# Patient Record
Sex: Female | Born: 1935 | Race: White | Hispanic: No | Marital: Married | Smoking: Never smoker
Health system: Southern US, Community
[De-identification: ages and names within clinical notes are randomized; demographics above are authoritative.]

## PROBLEM LIST (undated history)

## (undated) DIAGNOSIS — S060XAA Concussion with loss of consciousness status unknown, initial encounter: Secondary | ICD-10-CM

## (undated) DIAGNOSIS — I340 Nonrheumatic mitral (valve) insufficiency: Secondary | ICD-10-CM

## (undated) DIAGNOSIS — C569 Malignant neoplasm of unspecified ovary: Secondary | ICD-10-CM

## (undated) DIAGNOSIS — I2699 Other pulmonary embolism without acute cor pulmonale: Secondary | ICD-10-CM

## (undated) DIAGNOSIS — C801 Malignant (primary) neoplasm, unspecified: Secondary | ICD-10-CM

## (undated) DIAGNOSIS — R000 Tachycardia, unspecified: Secondary | ICD-10-CM

## (undated) DIAGNOSIS — C5700 Malignant neoplasm of unspecified fallopian tube: Secondary | ICD-10-CM

## (undated) DIAGNOSIS — K219 Gastro-esophageal reflux disease without esophagitis: Secondary | ICD-10-CM

## (undated) DIAGNOSIS — N3020 Other chronic cystitis without hematuria: Secondary | ICD-10-CM

## (undated) DIAGNOSIS — I890 Lymphedema, not elsewhere classified: Secondary | ICD-10-CM

## (undated) DIAGNOSIS — S060X9A Concussion with loss of consciousness of unspecified duration, initial encounter: Secondary | ICD-10-CM

## (undated) DIAGNOSIS — I341 Nonrheumatic mitral (valve) prolapse: Secondary | ICD-10-CM

## (undated) DIAGNOSIS — N3281 Overactive bladder: Secondary | ICD-10-CM

## (undated) HISTORY — DX: Nonrheumatic mitral (valve) insufficiency: I34.0

## (undated) HISTORY — DX: Malignant neoplasm of unspecified fallopian tube: C57.00

## (undated) HISTORY — PX: ABDOMINAL HYSTERECTOMY: SHX81

## (undated) HISTORY — DX: Concussion with loss of consciousness status unknown, initial encounter: S06.0XAA

## (undated) HISTORY — DX: Overactive bladder: N32.81

## (undated) HISTORY — DX: Other pulmonary embolism without acute cor pulmonale: I26.99

## (undated) HISTORY — DX: Concussion with loss of consciousness of unspecified duration, initial encounter: S06.0X9A

## (undated) HISTORY — DX: Other chronic cystitis without hematuria: N30.20

## (undated) HISTORY — PX: CHOLECYSTECTOMY: SHX55

## (undated) HISTORY — DX: Tachycardia, unspecified: R00.0

## (undated) HISTORY — DX: Gastro-esophageal reflux disease without esophagitis: K21.9

---

## 2004-08-30 ENCOUNTER — Ambulatory Visit: Payer: Self-pay | Admitting: Internal Medicine

## 2004-08-31 ENCOUNTER — Ambulatory Visit: Payer: Self-pay | Admitting: Podiatry

## 2005-07-31 ENCOUNTER — Ambulatory Visit: Payer: Self-pay | Admitting: Unknown Physician Specialty

## 2005-08-14 ENCOUNTER — Ambulatory Visit: Payer: Self-pay | Admitting: Unknown Physician Specialty

## 2005-09-02 ENCOUNTER — Ambulatory Visit: Payer: Self-pay | Admitting: Internal Medicine

## 2005-09-10 ENCOUNTER — Ambulatory Visit: Payer: Self-pay | Admitting: Unknown Physician Specialty

## 2005-10-05 ENCOUNTER — Ambulatory Visit: Payer: Self-pay | Admitting: Unknown Physician Specialty

## 2006-04-24 ENCOUNTER — Other Ambulatory Visit: Payer: Self-pay

## 2006-04-24 ENCOUNTER — Ambulatory Visit: Payer: Self-pay | Admitting: Specialist

## 2006-05-07 ENCOUNTER — Ambulatory Visit: Payer: Self-pay | Admitting: Specialist

## 2006-06-18 ENCOUNTER — Encounter: Payer: Self-pay | Admitting: Specialist

## 2006-07-06 ENCOUNTER — Encounter: Payer: Self-pay | Admitting: Specialist

## 2006-08-05 ENCOUNTER — Encounter: Payer: Self-pay | Admitting: Specialist

## 2006-11-10 ENCOUNTER — Ambulatory Visit: Payer: Self-pay | Admitting: Family Medicine

## 2006-11-19 ENCOUNTER — Ambulatory Visit: Payer: Self-pay | Admitting: Family Medicine

## 2007-11-12 ENCOUNTER — Ambulatory Visit: Payer: Self-pay | Admitting: Internal Medicine

## 2008-11-17 ENCOUNTER — Ambulatory Visit: Payer: Self-pay | Admitting: Internal Medicine

## 2008-12-07 ENCOUNTER — Ambulatory Visit: Payer: Self-pay | Admitting: Ophthalmology

## 2009-02-04 HISTORY — PX: LAPAROSCOPIC BILATERAL SALPINGO OOPHERECTOMY: SHX5890

## 2009-02-04 HISTORY — PX: OTHER SURGICAL HISTORY: SHX169

## 2009-03-01 ENCOUNTER — Ambulatory Visit: Payer: Self-pay | Admitting: Ophthalmology

## 2009-10-05 ENCOUNTER — Ambulatory Visit: Payer: Self-pay | Admitting: Gynecologic Oncology

## 2009-10-05 DIAGNOSIS — C5700 Malignant neoplasm of unspecified fallopian tube: Secondary | ICD-10-CM

## 2009-10-05 HISTORY — DX: Malignant neoplasm of unspecified fallopian tube: C57.00

## 2009-10-06 ENCOUNTER — Ambulatory Visit: Payer: Self-pay | Admitting: Internal Medicine

## 2009-10-10 ENCOUNTER — Ambulatory Visit: Payer: Self-pay | Admitting: Gynecologic Oncology

## 2009-10-16 ENCOUNTER — Ambulatory Visit: Payer: Self-pay | Admitting: Unknown Physician Specialty

## 2009-10-24 ENCOUNTER — Inpatient Hospital Stay: Payer: Self-pay | Admitting: Unknown Physician Specialty

## 2009-10-25 LAB — PATHOLOGY REPORT

## 2009-10-30 ENCOUNTER — Ambulatory Visit: Payer: Self-pay | Admitting: Unknown Physician Specialty

## 2009-11-04 ENCOUNTER — Ambulatory Visit: Payer: Self-pay | Admitting: Gynecologic Oncology

## 2009-11-14 ENCOUNTER — Ambulatory Visit: Payer: Self-pay | Admitting: Oncology

## 2009-11-19 LAB — CA 125: CA 125: 121.6 U/mL — ABNORMAL HIGH (ref 0.0–34.0)

## 2009-11-21 ENCOUNTER — Ambulatory Visit: Payer: Self-pay | Admitting: Internal Medicine

## 2009-11-28 ENCOUNTER — Ambulatory Visit: Payer: Self-pay | Admitting: Gynecologic Oncology

## 2009-11-30 ENCOUNTER — Observation Stay: Payer: Self-pay | Admitting: Internal Medicine

## 2009-12-05 ENCOUNTER — Ambulatory Visit: Payer: Self-pay | Admitting: Oncology

## 2009-12-05 ENCOUNTER — Ambulatory Visit: Payer: Self-pay | Admitting: Gynecologic Oncology

## 2010-01-04 ENCOUNTER — Ambulatory Visit: Payer: Self-pay | Admitting: Gynecologic Oncology

## 2010-01-04 ENCOUNTER — Ambulatory Visit: Payer: Self-pay | Admitting: Oncology

## 2010-01-27 LAB — CA 125: CA 125: 154.9 U/mL — ABNORMAL HIGH (ref 0.0–34.0)

## 2010-02-04 ENCOUNTER — Ambulatory Visit: Payer: Self-pay | Admitting: Oncology

## 2010-02-04 ENCOUNTER — Ambulatory Visit: Payer: Self-pay | Admitting: Gynecologic Oncology

## 2010-02-08 LAB — CA 125: CA 125: 77 U/mL — ABNORMAL HIGH (ref 0.0–34.0)

## 2010-03-07 ENCOUNTER — Ambulatory Visit: Payer: Self-pay | Admitting: Oncology

## 2010-03-07 ENCOUNTER — Ambulatory Visit: Payer: Self-pay | Admitting: Gynecologic Oncology

## 2010-03-20 LAB — CA 125: CA 125: 54.2 U/mL — ABNORMAL HIGH (ref 0.0–34.0)

## 2010-04-05 ENCOUNTER — Ambulatory Visit: Payer: Self-pay | Admitting: Gynecologic Oncology

## 2010-04-05 ENCOUNTER — Ambulatory Visit: Payer: Self-pay | Admitting: Oncology

## 2010-05-01 LAB — CA 125: CA 125: 26.8 U/mL (ref 0.0–34.0)

## 2010-05-06 ENCOUNTER — Ambulatory Visit: Payer: Self-pay | Admitting: Oncology

## 2010-05-06 ENCOUNTER — Ambulatory Visit: Payer: Self-pay | Admitting: Gynecologic Oncology

## 2010-06-05 ENCOUNTER — Ambulatory Visit: Payer: Self-pay | Admitting: Gynecologic Oncology

## 2010-06-05 ENCOUNTER — Ambulatory Visit: Payer: Self-pay | Admitting: Oncology

## 2010-06-12 LAB — CA 125: CA 125: 18.7 U/mL (ref 0.0–34.0)

## 2010-07-06 ENCOUNTER — Ambulatory Visit: Payer: Self-pay | Admitting: Oncology

## 2010-07-06 ENCOUNTER — Ambulatory Visit: Payer: Self-pay | Admitting: Gynecologic Oncology

## 2010-07-24 LAB — CA 125: CA 125: 19 U/mL (ref 0.0–34.0)

## 2010-08-05 ENCOUNTER — Ambulatory Visit: Payer: Self-pay | Admitting: Gynecologic Oncology

## 2010-08-05 ENCOUNTER — Ambulatory Visit: Payer: Self-pay | Admitting: Oncology

## 2010-08-22 ENCOUNTER — Emergency Department: Payer: Self-pay | Admitting: Emergency Medicine

## 2010-08-30 ENCOUNTER — Ambulatory Visit: Payer: Self-pay | Admitting: Orthopedic Surgery

## 2010-09-04 ENCOUNTER — Ambulatory Visit: Payer: Self-pay | Admitting: Orthopedic Surgery

## 2010-09-05 ENCOUNTER — Ambulatory Visit: Payer: Self-pay | Admitting: Oncology

## 2010-09-05 ENCOUNTER — Ambulatory Visit: Payer: Self-pay | Admitting: Gynecologic Oncology

## 2010-09-10 ENCOUNTER — Ambulatory Visit: Payer: Self-pay | Admitting: Orthopedic Surgery

## 2010-10-09 ENCOUNTER — Ambulatory Visit: Payer: Self-pay | Admitting: Oncology

## 2010-10-16 LAB — CA 125: CA 125: 20.4 U/mL (ref 0.0–34.0)

## 2010-10-19 ENCOUNTER — Encounter: Payer: Self-pay | Admitting: Orthopedic Surgery

## 2010-11-05 ENCOUNTER — Encounter: Payer: Self-pay | Admitting: Orthopedic Surgery

## 2010-11-05 ENCOUNTER — Ambulatory Visit: Payer: Self-pay | Admitting: Oncology

## 2010-11-26 ENCOUNTER — Ambulatory Visit: Payer: Self-pay | Admitting: Internal Medicine

## 2010-11-27 LAB — CA 125: CA 125: 25.9 U/mL (ref 0.0–34.0)

## 2010-12-06 ENCOUNTER — Ambulatory Visit: Payer: Self-pay | Admitting: Oncology

## 2011-01-05 ENCOUNTER — Ambulatory Visit: Payer: Self-pay | Admitting: Oncology

## 2011-01-08 LAB — CA 125: CA 125: 26.8 U/mL (ref 0.0–34.0)

## 2011-02-05 ENCOUNTER — Ambulatory Visit: Payer: Self-pay | Admitting: Oncology

## 2011-02-18 LAB — COMPREHENSIVE METABOLIC PANEL
Albumin: 3.2 g/dL — ABNORMAL LOW (ref 3.4–5.0)
Anion Gap: 9 (ref 7–16)
BUN: 26 mg/dL — ABNORMAL HIGH (ref 7–18)
Bilirubin,Total: 0.2 mg/dL (ref 0.2–1.0)
Calcium, Total: 8.5 mg/dL (ref 8.5–10.1)
Chloride: 104 mmol/L (ref 98–107)
Co2: 27 mmol/L (ref 21–32)
EGFR (African American): 49 — ABNORMAL LOW
Osmolality: 284 (ref 275–301)
Potassium: 4.3 mmol/L (ref 3.5–5.1)
SGPT (ALT): 20 U/L
Sodium: 140 mmol/L (ref 136–145)
Total Protein: 7 g/dL (ref 6.4–8.2)

## 2011-02-18 LAB — CBC CANCER CENTER
Eosinophil: 1 %
Lymphocytes: 37 %
MCH: 28.9 pg (ref 26.0–34.0)
MCHC: 32.6 g/dL (ref 32.0–36.0)
MCV: 89 fL (ref 80–100)
Platelet: 475 x10 3/mm — ABNORMAL HIGH (ref 150–440)
RBC: 3.02 10*6/uL — ABNORMAL LOW (ref 3.80–5.20)
RDW: 14.7 % — ABNORMAL HIGH (ref 11.5–14.5)
Segmented Neutrophils: 48 %

## 2011-02-18 LAB — MAGNESIUM: Magnesium: 2 mg/dL

## 2011-02-18 LAB — PROTIME-INR: Prothrombin Time: 13 secs (ref 11.5–14.7)

## 2011-02-18 LAB — CREATININE, URINE, RANDOM: Creatinine, Urine Random: 38.8 mg/dL (ref 30.0–125.0)

## 2011-02-18 LAB — PHOSPHORUS: Phosphorus: 4.4 mg/dL (ref 2.5–4.9)

## 2011-03-08 ENCOUNTER — Ambulatory Visit: Payer: Self-pay | Admitting: Oncology

## 2011-03-11 LAB — CBC CANCER CENTER
Bands: 4 %
Basophil: 1 %
Eosinophil: 1 %
HCT: 26.9 % — ABNORMAL LOW (ref 35.0–47.0)
HGB: 8.7 g/dL — ABNORMAL LOW (ref 12.0–16.0)
Lymphocytes: 21 %
MCH: 28.2 pg (ref 26.0–34.0)
MCHC: 32.5 g/dL (ref 32.0–36.0)
RDW: 16 % — ABNORMAL HIGH (ref 11.5–14.5)
Segmented Neutrophils: 66 %
WBC: 5.7 x10 3/mm (ref 3.6–11.0)

## 2011-03-11 LAB — IRON AND TIBC
Iron Saturation: 11 %
Iron: 46 ug/dL — ABNORMAL LOW (ref 50–170)

## 2011-03-15 LAB — OCCULT BLOOD X 1 CARD TO LAB, STOOL: Occult Blood, Feces: NEGATIVE

## 2011-04-01 LAB — COMPREHENSIVE METABOLIC PANEL
BUN: 27 mg/dL — ABNORMAL HIGH (ref 7–18)
Bilirubin,Total: 0.3 mg/dL (ref 0.2–1.0)
Chloride: 104 mmol/L (ref 98–107)
Co2: 26 mmol/L (ref 21–32)
Creatinine: 1.35 mg/dL — ABNORMAL HIGH (ref 0.60–1.30)
EGFR (African American): 49 — ABNORMAL LOW
SGPT (ALT): 22 U/L
Total Protein: 7.3 g/dL (ref 6.4–8.2)

## 2011-04-01 LAB — CBC CANCER CENTER
Eosinophil: 1 %
HCT: 26.7 % — ABNORMAL LOW (ref 35.0–47.0)
HGB: 8.6 g/dL — ABNORMAL LOW (ref 12.0–16.0)
Lymphocytes: 32 %
MCH: 27.7 pg (ref 26.0–34.0)
MCHC: 32.4 g/dL (ref 32.0–36.0)
Platelet: 491 x10 3/mm — ABNORMAL HIGH (ref 150–440)
RBC: 3.13 10*6/uL — ABNORMAL LOW (ref 3.80–5.20)
RDW: 16.2 % — ABNORMAL HIGH (ref 11.5–14.5)
Segmented Neutrophils: 59 %

## 2011-04-01 LAB — PROTEIN, URINE, RANDOM: Protein, Random Urine: 10 mg/dL (ref 0–12)

## 2011-04-01 LAB — CREATININE, URINE, RANDOM: Creatinine, Urine Random: 76.6 mg/dL (ref 30.0–125.0)

## 2011-04-03 LAB — CA 125: CA 125: 25.8 U/mL (ref 0.0–34.0)

## 2011-04-05 ENCOUNTER — Ambulatory Visit: Payer: Self-pay | Admitting: Oncology

## 2011-04-23 LAB — COMPREHENSIVE METABOLIC PANEL
Albumin: 3.6 g/dL (ref 3.4–5.0)
Anion Gap: 10 (ref 7–16)
BUN: 26 mg/dL — ABNORMAL HIGH (ref 7–18)
Calcium, Total: 8.3 mg/dL — ABNORMAL LOW (ref 8.5–10.1)
EGFR (African American): 51 — ABNORMAL LOW
Glucose: 84 mg/dL (ref 65–99)
Potassium: 4.9 mmol/L (ref 3.5–5.1)
SGOT(AST): 24 U/L (ref 15–37)
SGPT (ALT): 28 U/L
Sodium: 139 mmol/L (ref 136–145)
Total Protein: 7.8 g/dL (ref 6.4–8.2)

## 2011-04-23 LAB — CBC CANCER CENTER
Basophil: 1 %
HCT: 29.5 % — ABNORMAL LOW (ref 35.0–47.0)
MCHC: 32.7 g/dL (ref 32.0–36.0)
MCV: 85 fL (ref 80–100)
Monocytes: 13 %
RDW: 17.3 % — ABNORMAL HIGH (ref 11.5–14.5)
WBC: 5.9 x10 3/mm (ref 3.6–11.0)

## 2011-05-06 ENCOUNTER — Ambulatory Visit: Payer: Self-pay | Admitting: Oncology

## 2011-05-06 ENCOUNTER — Ambulatory Visit: Payer: Self-pay | Admitting: Gynecologic Oncology

## 2011-06-05 ENCOUNTER — Ambulatory Visit: Payer: Self-pay | Admitting: Oncology

## 2011-06-19 ENCOUNTER — Ambulatory Visit: Payer: Self-pay | Admitting: Surgery

## 2011-06-19 LAB — CBC
MCV: 84 fL (ref 80–100)
Platelet: 484 10*3/uL — ABNORMAL HIGH (ref 150–440)
RBC: 3.58 10*6/uL — ABNORMAL LOW (ref 3.80–5.20)
RDW: 17.6 % — ABNORMAL HIGH (ref 11.5–14.5)

## 2011-06-19 LAB — BASIC METABOLIC PANEL
Anion Gap: 10 (ref 7–16)
BUN: 24 mg/dL — ABNORMAL HIGH (ref 7–18)
Chloride: 106 mmol/L (ref 98–107)
Creatinine: 1.23 mg/dL (ref 0.60–1.30)
EGFR (African American): 48 — ABNORMAL LOW
EGFR (Non-African Amer.): 41 — ABNORMAL LOW
Glucose: 85 mg/dL (ref 65–99)
Osmolality: 283 (ref 275–301)
Potassium: 4.2 mmol/L (ref 3.5–5.1)
Sodium: 140 mmol/L (ref 136–145)

## 2011-06-24 ENCOUNTER — Ambulatory Visit: Payer: Self-pay | Admitting: Surgery

## 2011-06-26 ENCOUNTER — Ambulatory Visit: Payer: Self-pay | Admitting: Surgery

## 2011-07-01 LAB — PATHOLOGY REPORT

## 2011-07-09 ENCOUNTER — Ambulatory Visit: Payer: Self-pay | Admitting: Oncology

## 2011-07-09 LAB — CBC CANCER CENTER
Basophil %: 1 %
Eosinophil #: 0.2 x10 3/mm (ref 0.0–0.7)
Eosinophil %: 3 %
Lymphocyte #: 2.2 x10 3/mm (ref 1.0–3.6)
MCHC: 31.6 g/dL — ABNORMAL LOW (ref 32.0–36.0)
MCV: 84 fL (ref 80–100)
Monocyte #: 0.7 x10 3/mm (ref 0.2–0.9)
Neutrophil #: 3.2 x10 3/mm (ref 1.4–6.5)
Neutrophil %: 50.1 %
Platelet: 564 x10 3/mm — ABNORMAL HIGH (ref 150–440)
RDW: 18 % — ABNORMAL HIGH (ref 11.5–14.5)

## 2011-07-09 LAB — URINALYSIS, COMPLETE
Glucose,UR: NEGATIVE mg/dL (ref 0–75)
Ketone: NEGATIVE
Nitrite: NEGATIVE
Ph: 6 (ref 4.5–8.0)
Protein: NEGATIVE
Specific Gravity: 1.009 (ref 1.003–1.030)
WBC UR: 336 /HPF (ref 0–5)

## 2011-07-09 LAB — COMPREHENSIVE METABOLIC PANEL
Albumin: 3.6 g/dL (ref 3.4–5.0)
Alkaline Phosphatase: 104 U/L (ref 50–136)
Anion Gap: 8 (ref 7–16)
Chloride: 103 mmol/L (ref 98–107)
Co2: 29 mmol/L (ref 21–32)
EGFR (African American): 41 — ABNORMAL LOW
EGFR (Non-African Amer.): 35 — ABNORMAL LOW
Glucose: 87 mg/dL (ref 65–99)
Potassium: 4.1 mmol/L (ref 3.5–5.1)
SGOT(AST): 25 U/L (ref 15–37)
Total Protein: 7.8 g/dL (ref 6.4–8.2)

## 2011-07-09 LAB — CREATININE, URINE, RANDOM: Creatinine, Urine Random: 36.8 mg/dL (ref 30.0–125.0)

## 2011-07-09 LAB — PROTEIN, URINE, RANDOM: Protein, Random Urine: 7 mg/dL (ref 0–12)

## 2011-07-10 LAB — CA 125: CA 125: 25.9 U/mL (ref 0.0–34.0)

## 2011-07-26 LAB — URINE CULTURE

## 2011-08-05 ENCOUNTER — Ambulatory Visit: Payer: Self-pay | Admitting: Oncology

## 2011-09-05 ENCOUNTER — Ambulatory Visit: Payer: Self-pay | Admitting: Oncology

## 2011-09-19 LAB — CBC CANCER CENTER
Basophil %: 1.6 %
Eosinophil %: 1.2 %
HGB: 9.3 g/dL — ABNORMAL LOW (ref 12.0–16.0)
Lymphocyte #: 1.7 x10 3/mm (ref 1.0–3.6)
Lymphocyte %: 37.6 %
Monocyte %: 13.4 %
Neutrophil #: 2.1 x10 3/mm (ref 1.4–6.5)
Neutrophil %: 46.2 %
Platelet: 537 x10 3/mm — ABNORMAL HIGH (ref 150–440)
RBC: 3.45 10*6/uL — ABNORMAL LOW (ref 3.80–5.20)
WBC: 4.5 x10 3/mm (ref 3.6–11.0)

## 2011-09-19 LAB — COMPREHENSIVE METABOLIC PANEL
Alkaline Phosphatase: 100 U/L (ref 50–136)
Bilirubin,Total: 0.3 mg/dL (ref 0.2–1.0)
Calcium, Total: 8.8 mg/dL (ref 8.5–10.1)
Chloride: 102 mmol/L (ref 98–107)
Co2: 29 mmol/L (ref 21–32)
Creatinine: 1.18 mg/dL (ref 0.60–1.30)
EGFR (African American): 50 — ABNORMAL LOW
EGFR (Non-African Amer.): 43 — ABNORMAL LOW
Potassium: 4.1 mmol/L (ref 3.5–5.1)
SGOT(AST): 22 U/L (ref 15–37)
SGPT (ALT): 20 U/L (ref 12–78)

## 2011-09-20 LAB — CA 125: CA 125: 20.9 U/mL (ref 0.0–34.0)

## 2011-10-06 ENCOUNTER — Ambulatory Visit: Payer: Self-pay | Admitting: Oncology

## 2011-10-15 ENCOUNTER — Ambulatory Visit: Payer: Self-pay | Admitting: Surgery

## 2011-10-19 DIAGNOSIS — N3020 Other chronic cystitis without hematuria: Secondary | ICD-10-CM

## 2011-10-19 DIAGNOSIS — N3941 Urge incontinence: Secondary | ICD-10-CM | POA: Insufficient documentation

## 2011-10-19 HISTORY — DX: Other chronic cystitis without hematuria: N30.20

## 2011-10-24 ENCOUNTER — Ambulatory Visit: Payer: Self-pay | Admitting: Surgery

## 2011-10-25 LAB — PATHOLOGY REPORT

## 2011-11-04 ENCOUNTER — Ambulatory Visit: Payer: Self-pay | Admitting: Oncology

## 2011-11-05 ENCOUNTER — Ambulatory Visit: Payer: Self-pay | Admitting: Oncology

## 2011-11-14 LAB — CBC CANCER CENTER
Bands: 1 %
Eosinophil: 2 %
HCT: 27.1 % — ABNORMAL LOW (ref 35.0–47.0)
HGB: 8.4 g/dL — ABNORMAL LOW (ref 12.0–16.0)
Lymphocytes: 37 %
Monocytes: 6 %
Platelet: 491 x10 3/mm — ABNORMAL HIGH (ref 150–440)
RBC: 3.37 10*6/uL — ABNORMAL LOW (ref 3.80–5.20)
RDW: 16.8 % — ABNORMAL HIGH (ref 11.5–14.5)
Segmented Neutrophils: 51 %
WBC: 4.4 x10 3/mm (ref 3.6–11.0)

## 2011-11-14 LAB — COMPREHENSIVE METABOLIC PANEL
Anion Gap: 11 (ref 7–16)
Calcium, Total: 8.8 mg/dL (ref 8.5–10.1)
Co2: 26 mmol/L (ref 21–32)
EGFR (African American): 54 — ABNORMAL LOW
EGFR (Non-African Amer.): 47 — ABNORMAL LOW
Osmolality: 285 (ref 275–301)
Potassium: 3.7 mmol/L (ref 3.5–5.1)
SGPT (ALT): 18 U/L (ref 12–78)
Sodium: 142 mmol/L (ref 136–145)

## 2011-11-21 LAB — IRON AND TIBC
Iron Bind.Cap.(Total): 452 ug/dL — ABNORMAL HIGH (ref 250–450)
Iron Saturation: 8 %
Iron: 36 ug/dL — ABNORMAL LOW (ref 50–170)
Unbound Iron-Bind.Cap.: 416 ug/dL

## 2011-11-21 LAB — CBC CANCER CENTER
Basophil #: 0 x10 3/mm (ref 0.0–0.1)
Basophil %: 2.1 %
HCT: 27.2 % — ABNORMAL LOW (ref 35.0–47.0)
Lymphocyte #: 0.9 x10 3/mm — ABNORMAL LOW (ref 1.0–3.6)
MCH: 24.7 pg — ABNORMAL LOW (ref 26.0–34.0)
MCV: 80 fL (ref 80–100)
Monocyte #: 0.2 x10 3/mm (ref 0.2–0.9)
Monocyte %: 7.9 %
Neutrophil #: 0.9 x10 3/mm — ABNORMAL LOW (ref 1.4–6.5)
Neutrophil %: 44.8 %
Platelet: 339 x10 3/mm (ref 150–440)
RDW: 17 % — ABNORMAL HIGH (ref 11.5–14.5)

## 2011-11-21 LAB — FERRITIN: Ferritin (ARMC): 46 ng/mL (ref 8–388)

## 2011-11-28 LAB — CBC CANCER CENTER
Eosinophil: 1 %
HCT: 24.9 % — ABNORMAL LOW (ref 35.0–47.0)
MCH: 24.7 pg — ABNORMAL LOW (ref 26.0–34.0)
MCV: 81 fL (ref 80–100)
Monocytes: 9 %
RDW: 17.9 % — ABNORMAL HIGH (ref 11.5–14.5)
WBC: 4.7 x10 3/mm (ref 3.6–11.0)

## 2011-11-28 LAB — COMPREHENSIVE METABOLIC PANEL
Albumin: 3.2 g/dL — ABNORMAL LOW (ref 3.4–5.0)
Alkaline Phosphatase: 103 U/L (ref 50–136)
Anion Gap: 11 (ref 7–16)
BUN: 17 mg/dL (ref 7–18)
Bilirubin,Total: 0.1 mg/dL — ABNORMAL LOW (ref 0.2–1.0)
Co2: 26 mmol/L (ref 21–32)
Creatinine: 1.02 mg/dL (ref 0.60–1.30)
Glucose: 89 mg/dL (ref 65–99)
Potassium: 3.9 mmol/L (ref 3.5–5.1)
SGOT(AST): 24 U/L (ref 15–37)
SGPT (ALT): 24 U/L (ref 12–78)
Total Protein: 6.8 g/dL (ref 6.4–8.2)

## 2011-12-02 LAB — OCCULT BLOOD X 1 CARD TO LAB, STOOL: Occult Blood, Feces: NEGATIVE

## 2011-12-06 ENCOUNTER — Ambulatory Visit: Payer: Self-pay | Admitting: Oncology

## 2011-12-12 LAB — COMPREHENSIVE METABOLIC PANEL WITH GFR
Albumin: 3.3 g/dL — ABNORMAL LOW
Alkaline Phosphatase: 96 U/L
Anion Gap: 11
BUN: 17 mg/dL
Bilirubin,Total: 0.2 mg/dL
Calcium, Total: 8.6 mg/dL
Chloride: 104 mmol/L
Co2: 27 mmol/L
Creatinine: 1.04 mg/dL
EGFR (African American): 58 — ABNORMAL LOW
EGFR (Non-African Amer.): 50 — ABNORMAL LOW
Glucose: 80 mg/dL
Osmolality: 284
Potassium: 3.5 mmol/L
SGOT(AST): 24 U/L
SGPT (ALT): 27 U/L
Sodium: 142 mmol/L
Total Protein: 7 g/dL

## 2011-12-12 LAB — CBC CANCER CENTER
Basophil #: 0.1 "x10 3/mm "
Basophil %: 1.5 %
Eosinophil #: 0.1 "x10 3/mm "
Eosinophil %: 1.3 %
HCT: 29.1 % — ABNORMAL LOW
HGB: 8.9 g/dL — ABNORMAL LOW
Lymphocyte %: 25.1 %
Lymphs Abs: 1 "x10 3/mm "
MCH: 25.7 pg — ABNORMAL LOW
MCHC: 30.6 g/dL — ABNORMAL LOW
MCV: 84 fL
Monocyte #: 0.8 "x10 3/mm "
Monocyte %: 20.3 %
Neutrophil #: 2.1 "x10 3/mm "
Neutrophil %: 51.8 %
Platelet: 382 "x10 3/mm "
RBC: 3.46 "x10 6/mm " — ABNORMAL LOW
RDW: 20.5 % — ABNORMAL HIGH
WBC: 4.1 "x10 3/mm "

## 2011-12-19 LAB — CBC CANCER CENTER
Basophil #: 0 x10 3/mm (ref 0.0–0.1)
Basophil %: 1.9 %
Eosinophil #: 0 x10 3/mm (ref 0.0–0.7)
Eosinophil %: 1.1 %
HCT: 26.6 % — ABNORMAL LOW (ref 35.0–47.0)
HGB: 8.3 g/dL — ABNORMAL LOW (ref 12.0–16.0)
Lymphocyte #: 1.6 x10 3/mm (ref 1.0–3.6)
MCH: 26.7 pg (ref 26.0–34.0)
MCV: 86 fL (ref 80–100)
Monocyte #: 0.3 x10 3/mm (ref 0.2–0.9)
Monocyte %: 10.8 %
Neutrophil %: 23.3 %
RBC: 3.11 10*6/uL — ABNORMAL LOW (ref 3.80–5.20)
WBC: 2.6 x10 3/mm — ABNORMAL LOW (ref 3.6–11.0)

## 2011-12-24 ENCOUNTER — Ambulatory Visit: Payer: Self-pay | Admitting: Internal Medicine

## 2011-12-26 LAB — COMPREHENSIVE METABOLIC PANEL
Albumin: 3.4 g/dL (ref 3.4–5.0)
Anion Gap: 9 (ref 7–16)
BUN: 17 mg/dL (ref 7–18)
Calcium, Total: 8.4 mg/dL — ABNORMAL LOW (ref 8.5–10.1)
Chloride: 105 mmol/L (ref 98–107)
Osmolality: 283 (ref 275–301)
Potassium: 3.8 mmol/L (ref 3.5–5.1)
Total Protein: 7.1 g/dL (ref 6.4–8.2)

## 2011-12-26 LAB — CBC CANCER CENTER
Basophil: 2 %
Eosinophil: 1 %
HGB: 9.4 g/dL — ABNORMAL LOW (ref 12.0–16.0)
Lymphocytes: 35 %
MCH: 27.9 pg (ref 26.0–34.0)
MCV: 88 fL (ref 80–100)
Monocytes: 15 %
Platelet: 375 x10 3/mm (ref 150–440)
RBC: 3.36 10*6/uL — ABNORMAL LOW (ref 3.80–5.20)
Segmented Neutrophils: 47 %

## 2012-01-03 LAB — CBC CANCER CENTER
Basophil #: 0 x10 3/mm (ref 0.0–0.1)
Eosinophil #: 0 x10 3/mm (ref 0.0–0.7)
Eosinophil %: 0.9 %
HCT: 28.7 % — ABNORMAL LOW (ref 35.0–47.0)
Lymphocyte %: 53 %
MCV: 88 fL (ref 80–100)
Monocyte %: 22.5 %
Neutrophil #: 0.6 x10 3/mm — ABNORMAL LOW (ref 1.4–6.5)
Platelet: 377 x10 3/mm (ref 150–440)
RBC: 3.27 10*6/uL — ABNORMAL LOW (ref 3.80–5.20)
RDW: 28.4 % — ABNORMAL HIGH (ref 11.5–14.5)
WBC: 2.7 x10 3/mm — ABNORMAL LOW (ref 3.6–11.0)

## 2012-01-05 ENCOUNTER — Ambulatory Visit: Payer: Self-pay | Admitting: Oncology

## 2012-01-09 LAB — COMPREHENSIVE METABOLIC PANEL
Alkaline Phosphatase: 92 U/L (ref 50–136)
Calcium, Total: 8.5 mg/dL (ref 8.5–10.1)
Co2: 27 mmol/L (ref 21–32)
Creatinine: 1 mg/dL (ref 0.60–1.30)
EGFR (Non-African Amer.): 53 — ABNORMAL LOW
Osmolality: 284 (ref 275–301)
Potassium: 3.5 mmol/L (ref 3.5–5.1)
SGOT(AST): 24 U/L (ref 15–37)
SGPT (ALT): 26 U/L (ref 12–78)
Total Protein: 6.8 g/dL (ref 6.4–8.2)

## 2012-01-09 LAB — CBC CANCER CENTER
Basophil: 2 %
HCT: 30.2 % — ABNORMAL LOW (ref 35.0–47.0)
HGB: 9.9 g/dL — ABNORMAL LOW (ref 12.0–16.0)
Lymphocytes: 26 %
MCV: 88 fL (ref 80–100)
Monocytes: 14 %
RBC: 3.44 10*6/uL — ABNORMAL LOW (ref 3.80–5.20)
RDW: 28.7 % — ABNORMAL HIGH (ref 11.5–14.5)
WBC: 3.9 x10 3/mm (ref 3.6–11.0)

## 2012-01-16 LAB — CBC CANCER CENTER
Basophil #: 0.1 x10 3/mm (ref 0.0–0.1)
Eosinophil #: 0 x10 3/mm (ref 0.0–0.7)
MCH: 29.6 pg (ref 26.0–34.0)
MCHC: 33 g/dL (ref 32.0–36.0)
Monocyte #: 0.2 x10 3/mm (ref 0.2–0.9)
Neutrophil %: 40.2 %
Platelet: 253 x10 3/mm (ref 150–440)
RBC: 3.23 10*6/uL — ABNORMAL LOW (ref 3.80–5.20)
WBC: 1.8 x10 3/mm — CL (ref 3.6–11.0)

## 2012-01-16 LAB — COMPREHENSIVE METABOLIC PANEL
Albumin: 3.4 g/dL (ref 3.4–5.0)
Alkaline Phosphatase: 96 U/L (ref 50–136)
Anion Gap: 11 (ref 7–16)
Bilirubin,Total: 0.2 mg/dL (ref 0.2–1.0)
Calcium, Total: 8.7 mg/dL (ref 8.5–10.1)
Chloride: 104 mmol/L (ref 98–107)
Co2: 27 mmol/L (ref 21–32)
Creatinine: 1.02 mg/dL (ref 0.60–1.30)
EGFR (Non-African Amer.): 52 — ABNORMAL LOW
Osmolality: 285 (ref 275–301)
Potassium: 3.5 mmol/L (ref 3.5–5.1)
Sodium: 142 mmol/L (ref 136–145)
Total Protein: 6.9 g/dL (ref 6.4–8.2)

## 2012-01-23 LAB — CBC CANCER CENTER
Basophil: 2 %
Eosinophil: 2 %
HGB: 9.7 g/dL — ABNORMAL LOW (ref 12.0–16.0)
Lymphocytes: 27 %
MCHC: 32.9 g/dL (ref 32.0–36.0)
Monocytes: 17 %
Platelet: 334 x10 3/mm (ref 150–440)
RBC: 3.24 10*6/uL — ABNORMAL LOW (ref 3.80–5.20)
RDW: 29.6 % — ABNORMAL HIGH (ref 11.5–14.5)
Segmented Neutrophils: 52 %
WBC: 3 x10 3/mm — ABNORMAL LOW (ref 3.6–11.0)

## 2012-02-05 ENCOUNTER — Ambulatory Visit: Payer: Self-pay | Admitting: Oncology

## 2012-02-06 LAB — COMPREHENSIVE METABOLIC PANEL
Albumin: 3.2 g/dL — ABNORMAL LOW (ref 3.4–5.0)
Alkaline Phosphatase: 93 U/L (ref 50–136)
BUN: 16 mg/dL (ref 7–18)
Bilirubin,Total: 0.2 mg/dL (ref 0.2–1.0)
Calcium, Total: 8.2 mg/dL — ABNORMAL LOW (ref 8.5–10.1)
Chloride: 108 mmol/L — ABNORMAL HIGH (ref 98–107)
Co2: 27 mmol/L (ref 21–32)
EGFR (African American): >60
EGFR (Non-African Amer.): >60
Potassium: 3.5 mmol/L (ref 3.5–5.1)
SGOT(AST): 28 U/L (ref 15–37)
SGPT (ALT): 21 U/L (ref 12–78)
Sodium: 141 mmol/L (ref 136–145)

## 2012-02-06 LAB — CBC CANCER CENTER
Lymphocytes: 31 %
MCH: 31.4 pg (ref 26.0–34.0)
MCHC: 33 g/dL (ref 32.0–36.0)
MCV: 95 fL (ref 80–100)
RBC: 3.29 10*6/uL — ABNORMAL LOW (ref 3.80–5.20)
Segmented Neutrophils: 59 %

## 2012-02-07 LAB — CA 125: CA 125: 23.9 U/mL (ref 0.0–34.0)

## 2012-02-10 DIAGNOSIS — N393 Stress incontinence (female) (male): Secondary | ICD-10-CM | POA: Insufficient documentation

## 2012-02-13 LAB — CBC CANCER CENTER
Basophil %: 1.4 %
Eosinophil %: 1.3 %
HCT: 30.2 % — ABNORMAL LOW (ref 35.0–47.0)
HGB: 10.1 g/dL — ABNORMAL LOW (ref 12.0–16.0)
Lymphocyte #: 1.2 x10 3/mm (ref 1.0–3.6)
Lymphocyte %: 54.1 %
MCHC: 33.4 g/dL (ref 32.0–36.0)
MCV: 96 fL (ref 80–100)
Monocyte #: 0.1 x10 3/mm — ABNORMAL LOW (ref 0.2–0.9)
Monocyte %: 6.4 %
Platelet: 224 x10 3/mm (ref 150–440)
RBC: 3.14 10*6/uL — ABNORMAL LOW (ref 3.80–5.20)

## 2012-02-20 ENCOUNTER — Ambulatory Visit: Payer: Self-pay | Admitting: Oncology

## 2012-02-20 LAB — CBC CANCER CENTER
Basophil #: 0.1 x10 3/mm (ref 0.0–0.1)
Eosinophil #: 0 x10 3/mm (ref 0.0–0.7)
Eosinophil %: 1 %
HCT: 30.2 % — ABNORMAL LOW (ref 35.0–47.0)
Lymphocyte #: 1.3 x10 3/mm (ref 1.0–3.6)
Lymphocyte %: 36.1 %
Neutrophil #: 1.5 x10 3/mm (ref 1.4–6.5)
Neutrophil %: 40.2 %
Platelet: 231 x10 3/mm (ref 150–440)
WBC: 3.7 x10 3/mm (ref 3.6–11.0)

## 2012-02-27 LAB — CBC CANCER CENTER
Eosinophil %: 0.4 %
HCT: 29.9 % — ABNORMAL LOW (ref 35.0–47.0)
HGB: 10 g/dL — ABNORMAL LOW (ref 12.0–16.0)
Lymphocyte %: 45.7 %
MCH: 33.1 pg (ref 26.0–34.0)
MCHC: 33.5 g/dL (ref 32.0–36.0)
Neutrophil #: 0.8 x10 3/mm — ABNORMAL LOW (ref 1.4–6.5)
RDW: 25.1 % — ABNORMAL HIGH (ref 11.5–14.5)
WBC: 2.1 x10 3/mm — ABNORMAL LOW (ref 3.6–11.0)

## 2012-03-05 LAB — CBC CANCER CENTER
Basophil #: 0 x10 3/mm (ref 0.0–0.1)
Basophil %: 1 %
Eosinophil #: 0 x10 3/mm (ref 0.0–0.7)
Eosinophil %: 0.9 %
HCT: 32.1 % — ABNORMAL LOW (ref 35.0–47.0)
HGB: 10.4 g/dL — ABNORMAL LOW (ref 12.0–16.0)
Lymphocyte %: 24.2 %
MCH: 32.6 pg (ref 26.0–34.0)
MCHC: 32.5 g/dL (ref 32.0–36.0)
Monocyte #: 0.9 x10 3/mm (ref 0.2–0.9)
Platelet: 313 x10 3/mm (ref 150–440)
RDW: 23.9 % — ABNORMAL HIGH (ref 11.5–14.5)
WBC: 3.7 x10 3/mm (ref 3.6–11.0)

## 2012-03-05 LAB — COMPREHENSIVE METABOLIC PANEL
Albumin: 3.3 g/dL — ABNORMAL LOW (ref 3.4–5.0)
Anion Gap: 11 (ref 7–16)
BUN: 18 mg/dL (ref 7–18)
Bilirubin,Total: 0.2 mg/dL (ref 0.2–1.0)
Creatinine: 1.11 mg/dL (ref 0.60–1.30)
EGFR (Non-African Amer.): 47 — ABNORMAL LOW
Glucose: 112 mg/dL — ABNORMAL HIGH (ref 65–99)
Osmolality: 288 (ref 275–301)
Potassium: 3.4 mmol/L — ABNORMAL LOW (ref 3.5–5.1)
SGOT(AST): 24 U/L (ref 15–37)
SGPT (ALT): 22 U/L (ref 12–78)
Sodium: 143 mmol/L (ref 136–145)

## 2012-03-07 ENCOUNTER — Ambulatory Visit: Payer: Self-pay | Admitting: Oncology

## 2012-03-12 LAB — CBC CANCER CENTER
Basophil #: 0 x10 3/mm (ref 0.0–0.1)
Basophil %: 1.6 %
Eosinophil #: 0 x10 3/mm (ref 0.0–0.7)
HCT: 33.1 % — ABNORMAL LOW (ref 35.0–47.0)
Lymphocyte %: 37.2 %
MCHC: 33 g/dL (ref 32.0–36.0)
MCV: 102 fL — ABNORMAL HIGH (ref 80–100)
Monocyte #: 0.8 x10 3/mm (ref 0.2–0.9)
Neutrophil #: 1.1 x10 3/mm — ABNORMAL LOW (ref 1.4–6.5)
Neutrophil %: 35 %
RBC: 3.24 10*6/uL — ABNORMAL LOW (ref 3.80–5.20)
RDW: 22.4 % — ABNORMAL HIGH (ref 11.5–14.5)

## 2012-03-12 LAB — COMPREHENSIVE METABOLIC PANEL
Anion Gap: 6 — ABNORMAL LOW (ref 7–16)
BUN: 22 mg/dL — ABNORMAL HIGH (ref 7–18)
Bilirubin,Total: 0.2 mg/dL (ref 0.2–1.0)
Chloride: 106 mmol/L (ref 98–107)
Creatinine: 1.05 mg/dL (ref 0.60–1.30)
EGFR (African American): 58 — ABNORMAL LOW
Glucose: 68 mg/dL (ref 65–99)
Osmolality: 288 (ref 275–301)
Potassium: 5 mmol/L (ref 3.5–5.1)
SGPT (ALT): 19 U/L (ref 12–78)
Sodium: 144 mmol/L (ref 136–145)
Total Protein: 7.1 g/dL (ref 6.4–8.2)

## 2012-03-15 ENCOUNTER — Emergency Department: Payer: Self-pay | Admitting: Emergency Medicine

## 2012-03-15 LAB — CBC WITH DIFFERENTIAL/PLATELET
Basophil #: 0 10*3/uL (ref 0.0–0.1)
Basophil %: 0.5 %
Eosinophil #: 0 10*3/uL (ref 0.0–0.7)
HCT: 30.3 % — ABNORMAL LOW (ref 35.0–47.0)
HGB: 9.6 g/dL — ABNORMAL LOW (ref 12.0–16.0)
Lymphocyte #: 0.7 10*3/uL — ABNORMAL LOW (ref 1.0–3.6)
MCH: 32.6 pg (ref 26.0–34.0)
Monocyte #: 0.2 x10 3/mm (ref 0.2–0.9)
Neutrophil #: 5.5 10*3/uL (ref 1.4–6.5)
Neutrophil %: 84.6 %
Platelet: 427 10*3/uL (ref 150–440)
RDW: 19.8 % — ABNORMAL HIGH (ref 11.5–14.5)
WBC: 6.5 10*3/uL (ref 3.6–11.0)

## 2012-03-19 ENCOUNTER — Inpatient Hospital Stay: Payer: Self-pay | Admitting: Internal Medicine

## 2012-03-19 LAB — URINALYSIS, COMPLETE
Bacteria: NONE SEEN
Bilirubin,UR: NEGATIVE
Glucose,UR: NEGATIVE mg/dL (ref 0–75)
Ketone: NEGATIVE
Leukocyte Esterase: NEGATIVE
Ph: 7 (ref 4.5–8.0)
Protein: NEGATIVE
RBC,UR: <1 /HPF (ref 0–5)
Specific Gravity: 1.005 (ref 1.003–1.030)

## 2012-03-19 LAB — APTT: Activated PTT: 29.8 secs (ref 23.6–35.9)

## 2012-03-19 LAB — COMPREHENSIVE METABOLIC PANEL
Albumin: 3.1 g/dL — ABNORMAL LOW (ref 3.4–5.0)
Anion Gap: 7 (ref 7–16)
BUN: 14 mg/dL (ref 7–18)
Bilirubin,Total: 0.3 mg/dL (ref 0.2–1.0)
Chloride: 108 mmol/L — ABNORMAL HIGH (ref 98–107)
Co2: 26 mmol/L (ref 21–32)
Creatinine: 0.91 mg/dL (ref 0.60–1.30)
EGFR (African American): >60
EGFR (Non-African Amer.): 59 — ABNORMAL LOW
Potassium: 3.5 mmol/L (ref 3.5–5.1)
SGOT(AST): 32 U/L (ref 15–37)
SGPT (ALT): 23 U/L (ref 12–78)
Sodium: 141 mmol/L (ref 136–145)
Total Protein: 7 g/dL (ref 6.4–8.2)

## 2012-03-19 LAB — CBC
HGB: 9.6 g/dL — ABNORMAL LOW (ref 12.0–16.0)
MCH: 33.2 pg (ref 26.0–34.0)
MCHC: 32.4 g/dL (ref 32.0–36.0)
MCV: 102 fL — ABNORMAL HIGH (ref 80–100)
RBC: 2.88 10*6/uL — ABNORMAL LOW (ref 3.80–5.20)
RDW: 18.8 % — ABNORMAL HIGH (ref 11.5–14.5)
WBC: 3.9 10*3/uL (ref 3.6–11.0)

## 2012-03-20 LAB — CBC WITH DIFFERENTIAL/PLATELET
Basophil #: 0.1 10*3/uL (ref 0.0–0.1)
Basophil %: 1.7 %
HCT: 32.9 % — ABNORMAL LOW (ref 35.0–47.0)
MCHC: 33.2 g/dL (ref 32.0–36.0)
Monocyte %: 19.7 %
Neutrophil %: 49.4 %
Platelet: 317 10*3/uL (ref 150–440)
WBC: 3.9 10*3/uL (ref 3.6–11.0)

## 2012-03-20 LAB — BASIC METABOLIC PANEL
Calcium, Total: 8.9 mg/dL (ref 8.5–10.1)
Co2: 26 mmol/L (ref 21–32)
EGFR (African American): >60
EGFR (Non-African Amer.): 57 — ABNORMAL LOW
Glucose: 100 mg/dL — ABNORMAL HIGH (ref 65–99)
Osmolality: 278 (ref 275–301)
Potassium: 3.7 mmol/L (ref 3.5–5.1)

## 2012-03-21 LAB — URINE CULTURE

## 2012-03-25 LAB — CULTURE, BLOOD (SINGLE)

## 2012-03-26 LAB — CBC CANCER CENTER
Eosinophil #: 0.1 x10 3/mm (ref 0.0–0.7)
Eosinophil %: 1.3 %
Lymphocyte %: 30.8 %
MCH: 34.2 pg — ABNORMAL HIGH (ref 26.0–34.0)
MCV: 103 fL — ABNORMAL HIGH (ref 80–100)
Monocyte #: 0.9 x10 3/mm (ref 0.2–0.9)
Monocyte %: 17.8 %
Neutrophil #: 2.4 x10 3/mm (ref 1.4–6.5)
Neutrophil %: 47.9 %
Platelet: 345 x10 3/mm (ref 150–440)
WBC: 5 x10 3/mm (ref 3.6–11.0)

## 2012-04-04 ENCOUNTER — Ambulatory Visit: Payer: Self-pay | Admitting: Oncology

## 2012-04-09 LAB — CBC CANCER CENTER
Basophil #: 0 x10 3/mm (ref 0.0–0.1)
Basophil %: 1.1 %
Eosinophil %: 1.9 %
HCT: 34.8 % — ABNORMAL LOW (ref 35.0–47.0)
Lymphocyte %: 35.2 %
MCV: 102 fL — ABNORMAL HIGH (ref 80–100)
Monocyte #: 0.8 x10 3/mm (ref 0.2–0.9)
Monocyte %: 20.9 %
Platelet: 352 x10 3/mm (ref 150–440)
RBC: 3.41 10*6/uL — ABNORMAL LOW (ref 3.80–5.20)

## 2012-04-09 LAB — COMPREHENSIVE METABOLIC PANEL
Albumin: 3.4 g/dL (ref 3.4–5.0)
Alkaline Phosphatase: 95 U/L (ref 50–136)
Anion Gap: 10 (ref 7–16)
Bilirubin,Total: 0.2 mg/dL (ref 0.2–1.0)
Chloride: 103 mmol/L (ref 98–107)
Glucose: 55 mg/dL — ABNORMAL LOW (ref 65–99)
Potassium: 3.9 mmol/L (ref 3.5–5.1)
SGOT(AST): 22 U/L (ref 15–37)
SGPT (ALT): 18 U/L (ref 12–78)
Total Protein: 7.2 g/dL (ref 6.4–8.2)

## 2012-04-16 LAB — CBC CANCER CENTER
Basophil #: 0.1 x10 3/mm (ref 0.0–0.1)
Basophil %: 2.3 %
Eosinophil %: 1.2 %
HCT: 32.2 % — ABNORMAL LOW (ref 35.0–47.0)
HGB: 10.8 g/dL — ABNORMAL LOW (ref 12.0–16.0)
Lymphocyte #: 1.1 x10 3/mm (ref 1.0–3.6)
Lymphocyte %: 44.5 %
MCH: 34.1 pg — ABNORMAL HIGH (ref 26.0–34.0)
MCV: 102 fL — ABNORMAL HIGH (ref 80–100)
Monocyte #: 0.2 x10 3/mm (ref 0.2–0.9)
Monocyte %: 8.5 %
Neutrophil %: 43.5 %
Platelet: 157 x10 3/mm (ref 150–440)
RDW: 15.4 % — ABNORMAL HIGH (ref 11.5–14.5)
WBC: 2.4 x10 3/mm — ABNORMAL LOW (ref 3.6–11.0)

## 2012-04-16 LAB — COMPREHENSIVE METABOLIC PANEL
BUN: 19 mg/dL — ABNORMAL HIGH (ref 7–18)
Calcium, Total: 8.2 mg/dL — ABNORMAL LOW (ref 8.5–10.1)
Chloride: 105 mmol/L (ref 98–107)
Co2: 29 mmol/L (ref 21–32)
Creatinine: 1.03 mg/dL (ref 0.60–1.30)
EGFR (Non-African Amer.): 51 — ABNORMAL LOW
Glucose: 89 mg/dL (ref 65–99)
Osmolality: 283 (ref 275–301)
Potassium: 3.6 mmol/L (ref 3.5–5.1)
Total Protein: 7 g/dL (ref 6.4–8.2)

## 2012-04-23 LAB — CBC CANCER CENTER
HCT: 31.8 % — ABNORMAL LOW (ref 35.0–47.0)
HGB: 10.8 g/dL — ABNORMAL LOW (ref 12.0–16.0)
Monocyte #: 0.2 x10 3/mm (ref 0.2–0.9)
Neutrophil #: 0.6 x10 3/mm — ABNORMAL LOW (ref 1.4–6.5)
Neutrophil %: 28.1 %
Platelet: 64 x10 3/mm — ABNORMAL LOW (ref 150–440)
RDW: 15 % — ABNORMAL HIGH (ref 11.5–14.5)
WBC: 2.1 x10 3/mm — ABNORMAL LOW (ref 3.6–11.0)

## 2012-04-30 LAB — COMPREHENSIVE METABOLIC PANEL
Albumin: 3.3 g/dL — ABNORMAL LOW (ref 3.4–5.0)
Alkaline Phosphatase: 101 U/L (ref 50–136)
Anion Gap: 4 — ABNORMAL LOW (ref 7–16)
BUN: 17 mg/dL (ref 7–18)
Bilirubin,Total: 0.2 mg/dL (ref 0.2–1.0)
Calcium, Total: 8.3 mg/dL — ABNORMAL LOW (ref 8.5–10.1)
Chloride: 108 mmol/L — ABNORMAL HIGH (ref 98–107)
Creatinine: 1.13 mg/dL (ref 0.60–1.30)
EGFR (African American): 53 — ABNORMAL LOW
EGFR (Non-African Amer.): 46 — ABNORMAL LOW
Glucose: 57 mg/dL — ABNORMAL LOW (ref 65–99)
Potassium: 3.7 mmol/L (ref 3.5–5.1)
SGOT(AST): 25 U/L (ref 15–37)
Sodium: 140 mmol/L (ref 136–145)
Total Protein: 7.2 g/dL (ref 6.4–8.2)

## 2012-04-30 LAB — CBC CANCER CENTER
Basophil %: 1.7 %
Eosinophil #: 0 x10 3/mm (ref 0.0–0.7)
HCT: 33 % — ABNORMAL LOW (ref 35.0–47.0)
HGB: 11 g/dL — ABNORMAL LOW (ref 12.0–16.0)
Lymphocyte #: 0.8 x10 3/mm — ABNORMAL LOW (ref 1.0–3.6)
MCH: 33.7 pg (ref 26.0–34.0)
MCV: 101 fL — ABNORMAL HIGH (ref 80–100)
Monocyte %: 25.9 %
Platelet: 471 x10 3/mm — ABNORMAL HIGH (ref 150–440)
RBC: 3.26 10*6/uL — ABNORMAL LOW (ref 3.80–5.20)
RDW: 15 % — ABNORMAL HIGH (ref 11.5–14.5)
WBC: 2.8 x10 3/mm — ABNORMAL LOW (ref 3.6–11.0)

## 2012-05-05 ENCOUNTER — Ambulatory Visit: Payer: Self-pay | Admitting: Oncology

## 2012-05-07 LAB — COMPREHENSIVE METABOLIC PANEL
Bilirubin,Total: 0.2 mg/dL (ref 0.2–1.0)
Calcium, Total: 8.2 mg/dL — ABNORMAL LOW (ref 8.5–10.1)
Chloride: 105 mmol/L (ref 98–107)
Creatinine: 1.11 mg/dL (ref 0.60–1.30)
EGFR (African American): 54 — ABNORMAL LOW
EGFR (Non-African Amer.): 47 — ABNORMAL LOW
Osmolality: 286 (ref 275–301)
Potassium: 3.7 mmol/L (ref 3.5–5.1)
SGOT(AST): 23 U/L (ref 15–37)

## 2012-05-07 LAB — CBC CANCER CENTER
Basophil #: 0.2 x10 3/mm — ABNORMAL HIGH (ref 0.0–0.1)
Basophil %: 5.6 %
Eosinophil #: 0.1 x10 3/mm (ref 0.0–0.7)
HCT: 33.9 % — ABNORMAL LOW (ref 35.0–47.0)
HGB: 11 g/dL — ABNORMAL LOW (ref 12.0–16.0)
Lymphocyte #: 1 x10 3/mm (ref 1.0–3.6)
MCH: 32.8 pg (ref 26.0–34.0)
MCHC: 32.4 g/dL (ref 32.0–36.0)
MCV: 101 fL — ABNORMAL HIGH (ref 80–100)
Monocyte %: 27.9 %
Neutrophil %: 29.4 %
WBC: 2.8 x10 3/mm — ABNORMAL LOW (ref 3.6–11.0)

## 2012-05-21 LAB — COMPREHENSIVE METABOLIC PANEL
Albumin: 3.1 g/dL — ABNORMAL LOW (ref 3.4–5.0)
Alkaline Phosphatase: 94 U/L (ref 50–136)
BUN: 25 mg/dL — ABNORMAL HIGH (ref 7–18)
Bilirubin,Total: 0.1 mg/dL — ABNORMAL LOW (ref 0.2–1.0)
Calcium, Total: 8.6 mg/dL (ref 8.5–10.1)
Co2: 29 mmol/L (ref 21–32)
Creatinine: 1.28 mg/dL (ref 0.60–1.30)
EGFR (African American): 45 — ABNORMAL LOW
EGFR (Non-African Amer.): 39 — ABNORMAL LOW
Glucose: 85 mg/dL (ref 65–99)
Osmolality: 287 (ref 275–301)
Potassium: 4.3 mmol/L (ref 3.5–5.1)
SGPT (ALT): 16 U/L (ref 12–78)
Sodium: 142 mmol/L (ref 136–145)

## 2012-05-21 LAB — CBC CANCER CENTER
Basophil #: 0.1 x10 3/mm (ref 0.0–0.1)
Basophil %: 2.2 %
Eosinophil #: 0.1 x10 3/mm (ref 0.0–0.7)
MCV: 101 fL — ABNORMAL HIGH (ref 80–100)
Monocyte #: 0.7 x10 3/mm (ref 0.2–0.9)
Monocyte %: 14.5 %
Neutrophil %: 53.3 %
Platelet: 231 x10 3/mm (ref 150–440)

## 2012-05-28 LAB — CBC CANCER CENTER
Basophil %: 1.7 %
Eosinophil #: 0 x10 3/mm (ref 0.0–0.7)
HCT: 26.6 % — ABNORMAL LOW (ref 35.0–47.0)
HGB: 8.7 g/dL — ABNORMAL LOW (ref 12.0–16.0)
Lymphocyte %: 40.7 %
MCH: 33 pg (ref 26.0–34.0)
MCHC: 32.7 g/dL (ref 32.0–36.0)
Monocyte %: 7.3 %
Platelet: 134 x10 3/mm — ABNORMAL LOW (ref 150–440)
RBC: 2.64 10*6/uL — ABNORMAL LOW (ref 3.80–5.20)
RDW: 14.1 % (ref 11.5–14.5)
WBC: 2.9 x10 3/mm — ABNORMAL LOW (ref 3.6–11.0)

## 2012-06-04 ENCOUNTER — Ambulatory Visit: Payer: Self-pay | Admitting: Oncology

## 2012-07-02 ENCOUNTER — Ambulatory Visit: Payer: Self-pay | Admitting: Oncology

## 2012-07-06 ENCOUNTER — Ambulatory Visit: Payer: Self-pay | Admitting: Oncology

## 2012-07-07 DIAGNOSIS — R3989 Other symptoms and signs involving the genitourinary system: Secondary | ICD-10-CM | POA: Insufficient documentation

## 2012-08-04 ENCOUNTER — Ambulatory Visit: Payer: Self-pay | Admitting: Oncology

## 2012-09-04 ENCOUNTER — Ambulatory Visit: Payer: Self-pay | Admitting: Oncology

## 2012-10-05 ENCOUNTER — Ambulatory Visit: Payer: Self-pay | Admitting: Oncology

## 2012-10-07 LAB — CREATININE, SERUM
Creatinine: 1.01 mg/dL (ref 0.60–1.30)
EGFR (Non-African Amer.): 52 — ABNORMAL LOW

## 2012-10-12 LAB — CBC CANCER CENTER
Basophil #: 0 x10 3/mm (ref 0.0–0.1)
HCT: 39.1 % (ref 35.0–47.0)
Lymphocyte #: 1.8 x10 3/mm (ref 1.0–3.6)
MCH: 31.2 pg (ref 26.0–34.0)
MCV: 95 fL (ref 80–100)
Platelet: 339 x10 3/mm (ref 150–440)

## 2012-10-12 LAB — COMPREHENSIVE METABOLIC PANEL
Anion Gap: 8 (ref 7–16)
BUN: 18 mg/dL (ref 7–18)
Calcium, Total: 9.2 mg/dL (ref 8.5–10.1)
Chloride: 104 mmol/L (ref 98–107)
Co2: 30 mmol/L (ref 21–32)
Creatinine: 1.1 mg/dL (ref 0.60–1.30)
EGFR (African American): 54 — ABNORMAL LOW
EGFR (Non-African Amer.): 47 — ABNORMAL LOW
Glucose: 105 mg/dL — ABNORMAL HIGH (ref 65–99)
SGPT (ALT): 20 U/L (ref 12–78)
Sodium: 142 mmol/L (ref 136–145)
Total Protein: 7.9 g/dL (ref 6.4–8.2)

## 2012-11-04 ENCOUNTER — Ambulatory Visit: Payer: Self-pay | Admitting: Oncology

## 2012-12-05 ENCOUNTER — Ambulatory Visit: Payer: Self-pay | Admitting: Oncology

## 2012-12-29 ENCOUNTER — Ambulatory Visit: Payer: Self-pay | Admitting: Internal Medicine

## 2013-01-04 ENCOUNTER — Ambulatory Visit: Payer: Self-pay | Admitting: Oncology

## 2013-01-08 ENCOUNTER — Ambulatory Visit: Payer: Self-pay | Admitting: Internal Medicine

## 2013-01-11 LAB — CBC CANCER CENTER
Basophil #: 0 x10 3/mm (ref 0.0–0.1)
Basophil %: 0.9 %
Eosinophil %: 0.8 %
HCT: 39 % (ref 35.0–47.0)
HGB: 12.7 g/dL (ref 12.0–16.0)
Lymphocyte #: 1.8 x10 3/mm (ref 1.0–3.6)
Lymphocyte %: 32 %
MCH: 31.8 pg (ref 26.0–34.0)
MCHC: 32.6 g/dL (ref 32.0–36.0)
MCV: 97 fL (ref 80–100)
Monocyte #: 0.5 x10 3/mm (ref 0.2–0.9)
Neutrophil %: 56.7 %
WBC: 5.5 x10 3/mm (ref 3.6–11.0)

## 2013-01-11 LAB — COMPREHENSIVE METABOLIC PANEL
Alkaline Phosphatase: 90 U/L
BUN: 20 mg/dL — ABNORMAL HIGH (ref 7–18)
Bilirubin,Total: 0.2 mg/dL (ref 0.2–1.0)
Calcium, Total: 8.6 mg/dL (ref 8.5–10.1)
Creatinine: 1.15 mg/dL (ref 0.60–1.30)
EGFR (African American): 51 — ABNORMAL LOW
EGFR (Non-African Amer.): 44 — ABNORMAL LOW
Osmolality: 285 (ref 275–301)
Potassium: 4.1 mmol/L (ref 3.5–5.1)
Total Protein: 7.5 g/dL (ref 6.4–8.2)

## 2013-02-04 ENCOUNTER — Ambulatory Visit: Payer: Self-pay | Admitting: Oncology

## 2013-02-14 ENCOUNTER — Ambulatory Visit: Payer: Self-pay | Admitting: Internal Medicine

## 2013-03-19 ENCOUNTER — Ambulatory Visit: Payer: Self-pay | Admitting: Oncology

## 2013-03-19 LAB — CREATININE, SERUM
Creatinine: 1.05 mg/dL (ref 0.60–1.30)
EGFR (African American): 57 — ABNORMAL LOW
EGFR (Non-African Amer.): 49 — ABNORMAL LOW

## 2013-03-30 LAB — CBC CANCER CENTER
Basophil #: 0.1 x10 3/mm (ref 0.0–0.1)
Basophil %: 0.9 %
EOS ABS: 0 x10 3/mm (ref 0.0–0.7)
Eosinophil %: 0.8 %
HCT: 39.7 % (ref 35.0–47.0)
HGB: 12.9 g/dL (ref 12.0–16.0)
LYMPHS PCT: 30.3 %
Lymphocyte #: 1.7 x10 3/mm (ref 1.0–3.6)
MCH: 31 pg (ref 26.0–34.0)
MCHC: 32.5 g/dL (ref 32.0–36.0)
MCV: 96 fL (ref 80–100)
MONO ABS: 0.8 x10 3/mm (ref 0.2–0.9)
Monocyte %: 13.5 %
NEUTROS ABS: 3.1 x10 3/mm (ref 1.4–6.5)
Neutrophil %: 54.5 %
PLATELETS: 335 x10 3/mm (ref 150–440)
RBC: 4.15 10*6/uL (ref 3.80–5.20)
RDW: 13.3 % (ref 11.5–14.5)
WBC: 5.7 x10 3/mm (ref 3.6–11.0)

## 2013-03-30 LAB — COMPREHENSIVE METABOLIC PANEL
ALBUMIN: 3.5 g/dL (ref 3.4–5.0)
ALT: 17 U/L (ref 12–78)
ANION GAP: 6 — AB (ref 7–16)
AST: 20 U/L (ref 15–37)
Alkaline Phosphatase: 89 U/L
BUN: 22 mg/dL — AB (ref 7–18)
Bilirubin,Total: 0.3 mg/dL (ref 0.2–1.0)
CALCIUM: 8.1 mg/dL — AB (ref 8.5–10.1)
Chloride: 103 mmol/L (ref 98–107)
Co2: 31 mmol/L (ref 21–32)
Creatinine: 1.25 mg/dL (ref 0.60–1.30)
EGFR (African American): 46 — ABNORMAL LOW
EGFR (Non-African Amer.): 40 — ABNORMAL LOW
Glucose: 112 mg/dL — ABNORMAL HIGH (ref 65–99)
OSMOLALITY: 283 (ref 275–301)
POTASSIUM: 4 mmol/L (ref 3.5–5.1)
Sodium: 140 mmol/L (ref 136–145)
Total Protein: 7.5 g/dL (ref 6.4–8.2)

## 2013-03-31 LAB — CA 125: CA 125: 66.1 U/mL — ABNORMAL HIGH (ref 0.0–34.0)

## 2013-04-04 ENCOUNTER — Ambulatory Visit: Payer: Self-pay | Admitting: Oncology

## 2013-04-20 LAB — CBC CANCER CENTER
Basophil #: 0 x10 3/mm (ref 0.0–0.1)
Basophil %: 0.8 %
Eosinophil #: 0 x10 3/mm (ref 0.0–0.7)
Eosinophil %: 0.8 %
HCT: 37.5 % (ref 35.0–47.0)
HGB: 12.4 g/dL (ref 12.0–16.0)
LYMPHS ABS: 1.4 x10 3/mm (ref 1.0–3.6)
Lymphocyte %: 29.3 %
MCH: 31.3 pg (ref 26.0–34.0)
MCHC: 33 g/dL (ref 32.0–36.0)
MCV: 95 fL (ref 80–100)
Monocyte #: 0.6 x10 3/mm (ref 0.2–0.9)
Monocyte %: 12.7 %
NEUTROS PCT: 56.4 %
Neutrophil #: 2.6 x10 3/mm (ref 1.4–6.5)
Platelet: 313 x10 3/mm (ref 150–440)
RBC: 3.95 10*6/uL (ref 3.80–5.20)
RDW: 13.3 % (ref 11.5–14.5)
WBC: 4.6 x10 3/mm (ref 3.6–11.0)

## 2013-04-27 LAB — CBC CANCER CENTER
Basophil #: 0 x10 3/mm (ref 0.0–0.1)
Basophil %: 0.5 %
Eosinophil #: 0.1 x10 3/mm (ref 0.0–0.7)
Eosinophil %: 2.5 %
HCT: 39.4 % (ref 35.0–47.0)
HGB: 12.7 g/dL (ref 12.0–16.0)
LYMPHS ABS: 1.4 x10 3/mm (ref 1.0–3.6)
Lymphocyte %: 29.3 %
MCH: 30.8 pg (ref 26.0–34.0)
MCHC: 32.2 g/dL (ref 32.0–36.0)
MCV: 96 fL (ref 80–100)
MONO ABS: 0.5 x10 3/mm (ref 0.2–0.9)
MONOS PCT: 11 %
Neutrophil #: 2.7 x10 3/mm (ref 1.4–6.5)
Neutrophil %: 56.7 %
Platelet: 302 x10 3/mm (ref 150–440)
RBC: 4.13 10*6/uL (ref 3.80–5.20)
RDW: 13.7 % (ref 11.5–14.5)
WBC: 4.7 x10 3/mm (ref 3.6–11.0)

## 2013-05-04 LAB — CBC CANCER CENTER
Basophil #: 0.1 x10 3/mm (ref 0.0–0.1)
Basophil %: 0.9 %
Eosinophil #: 0.1 x10 3/mm (ref 0.0–0.7)
Eosinophil %: 2.1 %
HCT: 37.5 % (ref 35.0–47.0)
HGB: 12.1 g/dL (ref 12.0–16.0)
LYMPHS ABS: 1.3 x10 3/mm (ref 1.0–3.6)
Lymphocyte %: 23.2 %
MCH: 31 pg (ref 26.0–34.0)
MCHC: 32.2 g/dL (ref 32.0–36.0)
MCV: 96 fL (ref 80–100)
MONO ABS: 0.8 x10 3/mm (ref 0.2–0.9)
Monocyte %: 14.5 %
Neutrophil #: 3.4 x10 3/mm (ref 1.4–6.5)
Neutrophil %: 59.3 %
Platelet: 298 x10 3/mm (ref 150–440)
RBC: 3.89 10*6/uL (ref 3.80–5.20)
RDW: 13.5 % (ref 11.5–14.5)
WBC: 5.7 x10 3/mm (ref 3.6–11.0)

## 2013-05-05 ENCOUNTER — Ambulatory Visit: Payer: Self-pay | Admitting: Oncology

## 2013-05-11 LAB — CBC CANCER CENTER
BASOS ABS: 0.1 x10 3/mm (ref 0.0–0.1)
BASOS PCT: 1.3 %
EOS ABS: 0.1 x10 3/mm (ref 0.0–0.7)
Eosinophil %: 2.5 %
HCT: 37.7 % (ref 35.0–47.0)
HGB: 12.3 g/dL (ref 12.0–16.0)
Lymphocyte #: 1.2 x10 3/mm (ref 1.0–3.6)
Lymphocyte %: 25.2 %
MCH: 30.9 pg (ref 26.0–34.0)
MCHC: 32.5 g/dL (ref 32.0–36.0)
MCV: 95 fL (ref 80–100)
MONOS PCT: 11.2 %
Monocyte #: 0.5 x10 3/mm (ref 0.2–0.9)
Neutrophil #: 2.8 x10 3/mm (ref 1.4–6.5)
Neutrophil %: 59.8 %
Platelet: 308 x10 3/mm (ref 150–440)
RBC: 3.98 10*6/uL (ref 3.80–5.20)
RDW: 13.5 % (ref 11.5–14.5)
WBC: 4.7 x10 3/mm (ref 3.6–11.0)

## 2013-06-04 ENCOUNTER — Ambulatory Visit: Payer: Self-pay | Admitting: Oncology

## 2013-07-05 ENCOUNTER — Ambulatory Visit: Payer: Self-pay | Admitting: Gynecologic Oncology

## 2013-07-05 ENCOUNTER — Ambulatory Visit: Payer: Self-pay | Admitting: Oncology

## 2013-07-06 DIAGNOSIS — R609 Edema, unspecified: Secondary | ICD-10-CM | POA: Insufficient documentation

## 2013-07-06 LAB — CBC CANCER CENTER
BASOS PCT: 0.9 %
Basophil #: 0 x10 3/mm (ref 0.0–0.1)
EOS ABS: 0 x10 3/mm (ref 0.0–0.7)
Eosinophil %: 0.8 %
HCT: 38.4 % (ref 35.0–47.0)
HGB: 12.5 g/dL (ref 12.0–16.0)
Lymphocyte #: 1.1 x10 3/mm (ref 1.0–3.6)
Lymphocyte %: 27.5 %
MCH: 31.1 pg (ref 26.0–34.0)
MCHC: 32.5 g/dL (ref 32.0–36.0)
MCV: 96 fL (ref 80–100)
Monocyte #: 0.6 x10 3/mm (ref 0.2–0.9)
Monocyte %: 15.8 %
NEUTROS ABS: 2.1 x10 3/mm (ref 1.4–6.5)
NEUTROS PCT: 55 %
Platelet: 307 x10 3/mm (ref 150–440)
RBC: 4.01 10*6/uL (ref 3.80–5.20)
RDW: 14.2 % (ref 11.5–14.5)
WBC: 3.9 x10 3/mm (ref 3.6–11.0)

## 2013-07-06 LAB — COMPREHENSIVE METABOLIC PANEL
ANION GAP: 6 — AB (ref 7–16)
Albumin: 3.4 g/dL (ref 3.4–5.0)
Alkaline Phosphatase: 95 U/L
BUN: 17 mg/dL (ref 7–18)
Bilirubin,Total: 0.3 mg/dL (ref 0.2–1.0)
CALCIUM: 8.7 mg/dL (ref 8.5–10.1)
Chloride: 105 mmol/L (ref 98–107)
Co2: 31 mmol/L (ref 21–32)
Creatinine: 0.98 mg/dL (ref 0.60–1.30)
EGFR (African American): >60
GFR CALC NON AF AMER: 53 — AB
Glucose: 96 mg/dL (ref 65–99)
Osmolality: 285 (ref 275–301)
Potassium: 4.4 mmol/L (ref 3.5–5.1)
SGOT(AST): 17 U/L (ref 15–37)
SGPT (ALT): 17 U/L (ref 12–78)
SODIUM: 142 mmol/L (ref 136–145)
Total Protein: 7.5 g/dL (ref 6.4–8.2)

## 2013-07-08 LAB — CA 125: CA 125: 26.5 U/mL (ref 0.0–34.0)

## 2013-07-13 ENCOUNTER — Ambulatory Visit: Payer: Self-pay | Admitting: Internal Medicine

## 2013-07-22 LAB — CBC CANCER CENTER
BASOS ABS: 0 x10 3/mm (ref 0.0–0.1)
BASOS PCT: 1 %
Eosinophil #: 0 x10 3/mm (ref 0.0–0.7)
Eosinophil %: 1.1 %
HCT: 37 % (ref 35.0–47.0)
HGB: 12.2 g/dL (ref 12.0–16.0)
LYMPHS PCT: 24.2 %
Lymphocyte #: 1 x10 3/mm (ref 1.0–3.6)
MCH: 31.4 pg (ref 26.0–34.0)
MCHC: 33 g/dL (ref 32.0–36.0)
MCV: 95 fL (ref 80–100)
Monocyte #: 0.7 x10 3/mm (ref 0.2–0.9)
Monocyte %: 16.6 %
Neutrophil #: 2.3 x10 3/mm (ref 1.4–6.5)
Neutrophil %: 57.1 %
PLATELETS: 395 x10 3/mm (ref 150–440)
RBC: 3.89 10*6/uL (ref 3.80–5.20)
RDW: 14 % (ref 11.5–14.5)
WBC: 4 x10 3/mm (ref 3.6–11.0)

## 2013-07-22 LAB — COMPREHENSIVE METABOLIC PANEL
Albumin: 3.2 g/dL — ABNORMAL LOW (ref 3.4–5.0)
Alkaline Phosphatase: 92 U/L
Anion Gap: 8 (ref 7–16)
BILIRUBIN TOTAL: 0.3 mg/dL (ref 0.2–1.0)
BUN: 19 mg/dL — ABNORMAL HIGH (ref 7–18)
CALCIUM: 9 mg/dL (ref 8.5–10.1)
Chloride: 105 mmol/L (ref 98–107)
Co2: 29 mmol/L (ref 21–32)
Creatinine: 1.06 mg/dL (ref 0.60–1.30)
EGFR (Non-African Amer.): 49 — ABNORMAL LOW
GFR CALC AF AMER: 56 — AB
Glucose: 88 mg/dL (ref 65–99)
OSMOLALITY: 285 (ref 275–301)
Potassium: 4.3 mmol/L (ref 3.5–5.1)
SGOT(AST): 18 U/L (ref 15–37)
SGPT (ALT): 15 U/L (ref 12–78)
SODIUM: 142 mmol/L (ref 136–145)
Total Protein: 7.6 g/dL (ref 6.4–8.2)

## 2013-07-28 DIAGNOSIS — R079 Chest pain, unspecified: Secondary | ICD-10-CM | POA: Insufficient documentation

## 2013-08-04 ENCOUNTER — Ambulatory Visit: Payer: Self-pay | Admitting: Gynecologic Oncology

## 2013-08-04 ENCOUNTER — Ambulatory Visit: Payer: Self-pay | Admitting: Oncology

## 2013-08-05 LAB — CBC CANCER CENTER
BASOS PCT: 1.3 %
Basophil #: 0 x10 3/mm (ref 0.0–0.1)
EOS ABS: 0 x10 3/mm (ref 0.0–0.7)
EOS PCT: 1 %
HCT: 37.5 % (ref 35.0–47.0)
HGB: 12.2 g/dL (ref 12.0–16.0)
LYMPHS PCT: 28 %
Lymphocyte #: 1 x10 3/mm (ref 1.0–3.6)
MCH: 31.3 pg (ref 26.0–34.0)
MCHC: 32.6 g/dL (ref 32.0–36.0)
MCV: 96 fL (ref 80–100)
MONO ABS: 0.6 x10 3/mm (ref 0.2–0.9)
Monocyte %: 17.6 %
NEUTROS ABS: 1.8 x10 3/mm (ref 1.4–6.5)
Neutrophil %: 52.1 %
Platelet: 324 x10 3/mm (ref 150–440)
RBC: 3.9 10*6/uL (ref 3.80–5.20)
RDW: 14.3 % (ref 11.5–14.5)
WBC: 3.5 x10 3/mm — ABNORMAL LOW (ref 3.6–11.0)

## 2013-08-05 LAB — COMPREHENSIVE METABOLIC PANEL
ALK PHOS: 92 U/L
ANION GAP: 6 — AB (ref 7–16)
AST: 20 U/L (ref 15–37)
Albumin: 3.3 g/dL — ABNORMAL LOW (ref 3.4–5.0)
BUN: 18 mg/dL (ref 7–18)
Bilirubin,Total: 0.3 mg/dL (ref 0.2–1.0)
CALCIUM: 8.8 mg/dL (ref 8.5–10.1)
CREATININE: 1.03 mg/dL (ref 0.60–1.30)
Chloride: 105 mmol/L (ref 98–107)
Co2: 31 mmol/L (ref 21–32)
EGFR (African American): 58 — ABNORMAL LOW
EGFR (Non-African Amer.): 50 — ABNORMAL LOW
GLUCOSE: 90 mg/dL (ref 65–99)
Osmolality: 285 (ref 275–301)
POTASSIUM: 4.2 mmol/L (ref 3.5–5.1)
SGPT (ALT): 16 U/L (ref 12–78)
Sodium: 142 mmol/L (ref 136–145)
Total Protein: 7.5 g/dL (ref 6.4–8.2)

## 2013-09-03 LAB — CBC CANCER CENTER
Basophil #: 0.1 x10 3/mm (ref 0.0–0.1)
Basophil %: 1.5 %
EOS ABS: 0 x10 3/mm (ref 0.0–0.7)
Eosinophil %: 0.5 %
HCT: 37.5 % (ref 35.0–47.0)
HGB: 12.3 g/dL (ref 12.0–16.0)
LYMPHS PCT: 27.4 %
Lymphocyte #: 1 x10 3/mm (ref 1.0–3.6)
MCH: 31.5 pg (ref 26.0–34.0)
MCHC: 32.8 g/dL (ref 32.0–36.0)
MCV: 96 fL (ref 80–100)
MONOS PCT: 13.6 %
Monocyte #: 0.5 x10 3/mm (ref 0.2–0.9)
Neutrophil #: 2 x10 3/mm (ref 1.4–6.5)
Neutrophil %: 57 %
Platelet: 328 x10 3/mm (ref 150–440)
RBC: 3.91 10*6/uL (ref 3.80–5.20)
RDW: 14 % (ref 11.5–14.5)
WBC: 3.6 x10 3/mm (ref 3.6–11.0)

## 2013-09-03 LAB — COMPREHENSIVE METABOLIC PANEL
ALT: 17 U/L
AST: 19 U/L (ref 15–37)
Albumin: 3.4 g/dL (ref 3.4–5.0)
Alkaline Phosphatase: 90 U/L
Anion Gap: 6 — ABNORMAL LOW (ref 7–16)
BUN: 14 mg/dL (ref 7–18)
Bilirubin,Total: 0.3 mg/dL (ref 0.2–1.0)
CO2: 30 mmol/L (ref 21–32)
CREATININE: 1.14 mg/dL (ref 0.60–1.30)
Calcium, Total: 8.8 mg/dL (ref 8.5–10.1)
Chloride: 103 mmol/L (ref 98–107)
EGFR (African American): 52 — ABNORMAL LOW
GFR CALC NON AF AMER: 44 — AB
Glucose: 99 mg/dL (ref 65–99)
Osmolality: 278 (ref 275–301)
Potassium: 4.4 mmol/L (ref 3.5–5.1)
Sodium: 139 mmol/L (ref 136–145)
Total Protein: 7.3 g/dL (ref 6.4–8.2)

## 2013-09-04 ENCOUNTER — Ambulatory Visit: Payer: Self-pay | Admitting: Gynecologic Oncology

## 2013-09-04 ENCOUNTER — Ambulatory Visit: Payer: Self-pay | Admitting: Oncology

## 2013-09-06 ENCOUNTER — Ambulatory Visit: Payer: Self-pay | Admitting: Vascular Surgery

## 2013-09-06 LAB — CA 125: CA 125: 37.8 U/mL — ABNORMAL HIGH (ref 0.0–34.0)

## 2013-09-26 DIAGNOSIS — D473 Essential (hemorrhagic) thrombocythemia: Secondary | ICD-10-CM | POA: Insufficient documentation

## 2013-09-26 DIAGNOSIS — E559 Vitamin D deficiency, unspecified: Secondary | ICD-10-CM | POA: Insufficient documentation

## 2013-09-26 DIAGNOSIS — N390 Urinary tract infection, site not specified: Secondary | ICD-10-CM | POA: Insufficient documentation

## 2013-09-26 DIAGNOSIS — D75839 Thrombocytosis, unspecified: Secondary | ICD-10-CM | POA: Insufficient documentation

## 2013-10-04 LAB — CBC CANCER CENTER
BASOS PCT: 0.9 %
Basophil #: 0 x10 3/mm (ref 0.0–0.1)
EOS ABS: 0 x10 3/mm (ref 0.0–0.7)
Eosinophil %: 1.3 %
HCT: 38.3 % (ref 35.0–47.0)
HGB: 12.5 g/dL (ref 12.0–16.0)
Lymphocyte #: 1.1 x10 3/mm (ref 1.0–3.6)
Lymphocyte %: 30 %
MCH: 31.6 pg (ref 26.0–34.0)
MCHC: 32.6 g/dL (ref 32.0–36.0)
MCV: 97 fL (ref 80–100)
Monocyte #: 0.6 x10 3/mm (ref 0.2–0.9)
Monocyte %: 15.7 %
NEUTROS PCT: 52.1 %
Neutrophil #: 1.9 x10 3/mm (ref 1.4–6.5)
Platelet: 315 x10 3/mm (ref 150–440)
RBC: 3.95 10*6/uL (ref 3.80–5.20)
RDW: 14.1 % (ref 11.5–14.5)
WBC: 3.6 x10 3/mm (ref 3.6–11.0)

## 2013-10-04 LAB — COMPREHENSIVE METABOLIC PANEL
ALBUMIN: 3.2 g/dL — AB (ref 3.4–5.0)
ALT: 14 U/L
ANION GAP: 7 (ref 7–16)
Alkaline Phosphatase: 79 U/L
BILIRUBIN TOTAL: 0.4 mg/dL (ref 0.2–1.0)
BUN: 16 mg/dL (ref 7–18)
CALCIUM: 8.4 mg/dL — AB (ref 8.5–10.1)
CHLORIDE: 105 mmol/L (ref 98–107)
Co2: 31 mmol/L (ref 21–32)
Creatinine: 1.07 mg/dL (ref 0.60–1.30)
EGFR (Non-African Amer.): 48 — ABNORMAL LOW
GFR CALC AF AMER: 56 — AB
Glucose: 91 mg/dL (ref 65–99)
OSMOLALITY: 286 (ref 275–301)
POTASSIUM: 4.6 mmol/L (ref 3.5–5.1)
SGOT(AST): 18 U/L (ref 15–37)
Sodium: 143 mmol/L (ref 136–145)
TOTAL PROTEIN: 7 g/dL (ref 6.4–8.2)

## 2013-10-05 ENCOUNTER — Ambulatory Visit: Payer: Self-pay | Admitting: Oncology

## 2013-10-05 ENCOUNTER — Ambulatory Visit: Payer: Self-pay | Admitting: Gynecologic Oncology

## 2013-11-01 LAB — COMPREHENSIVE METABOLIC PANEL
ALBUMIN: 3.3 g/dL — AB (ref 3.4–5.0)
ALK PHOS: 77 U/L
ANION GAP: 6 — AB (ref 7–16)
BUN: 16 mg/dL (ref 7–18)
Bilirubin,Total: 0.3 mg/dL (ref 0.2–1.0)
Calcium, Total: 9 mg/dL (ref 8.5–10.1)
Chloride: 101 mmol/L (ref 98–107)
Co2: 29 mmol/L (ref 21–32)
Creatinine: 1.03 mg/dL (ref 0.60–1.30)
GFR CALC NON AF AMER: 54 — AB
Glucose: 88 mg/dL (ref 65–99)
Osmolality: 273 (ref 275–301)
POTASSIUM: 4 mmol/L (ref 3.5–5.1)
SGOT(AST): 18 U/L (ref 15–37)
SGPT (ALT): 17 U/L
Sodium: 136 mmol/L (ref 136–145)
Total Protein: 6.8 g/dL (ref 6.4–8.2)

## 2013-11-01 LAB — CBC CANCER CENTER
BASOS PCT: 1.2 %
Basophil #: 0 x10 3/mm (ref 0.0–0.1)
Eosinophil #: 0 x10 3/mm (ref 0.0–0.7)
Eosinophil %: 1.2 %
HCT: 39 % (ref 35.0–47.0)
HGB: 12.6 g/dL (ref 12.0–16.0)
LYMPHS ABS: 1.2 x10 3/mm (ref 1.0–3.6)
LYMPHS PCT: 31.2 %
MCH: 31.2 pg (ref 26.0–34.0)
MCHC: 32.3 g/dL (ref 32.0–36.0)
MCV: 97 fL (ref 80–100)
Monocyte #: 0.6 x10 3/mm (ref 0.2–0.9)
Monocyte %: 15.3 %
Neutrophil #: 2 x10 3/mm (ref 1.4–6.5)
Neutrophil %: 51.1 %
Platelet: 384 x10 3/mm (ref 150–440)
RBC: 4.03 10*6/uL (ref 3.80–5.20)
RDW: 13.8 % (ref 11.5–14.5)
WBC: 3.8 x10 3/mm (ref 3.6–11.0)

## 2013-11-02 LAB — CA 125: CA 125: 59.4 U/mL — ABNORMAL HIGH (ref 0.0–34.0)

## 2013-11-04 ENCOUNTER — Ambulatory Visit: Payer: Self-pay | Admitting: Oncology

## 2013-12-05 ENCOUNTER — Ambulatory Visit: Payer: Self-pay | Admitting: Oncology

## 2013-12-23 LAB — COMPREHENSIVE METABOLIC PANEL
Albumin: 3.4 g/dL (ref 3.4–5.0)
Alkaline Phosphatase: 83 U/L
Anion Gap: 8 (ref 7–16)
BUN: 16 mg/dL (ref 7–18)
Bilirubin,Total: 0.4 mg/dL (ref 0.2–1.0)
Calcium, Total: 8.8 mg/dL (ref 8.5–10.1)
Chloride: 107 mmol/L (ref 98–107)
Co2: 28 mmol/L (ref 21–32)
Creatinine: 1 mg/dL (ref 0.60–1.30)
EGFR (African American): >60
EGFR (Non-African Amer.): 56 — ABNORMAL LOW
Glucose: 48 mg/dL — ABNORMAL LOW (ref 65–99)
Osmolality: 283 (ref 275–301)
Potassium: 3.6 mmol/L (ref 3.5–5.1)
SGOT(AST): 18 U/L (ref 15–37)
SGPT (ALT): 18 U/L
Sodium: 143 mmol/L (ref 136–145)
Total Protein: 7.2 g/dL (ref 6.4–8.2)

## 2013-12-23 LAB — CBC CANCER CENTER
Basophil #: 0 x10 3/mm (ref 0.0–0.1)
Basophil %: 0.7 %
EOS PCT: 1.6 %
Eosinophil #: 0.1 x10 3/mm (ref 0.0–0.7)
HCT: 40.2 % (ref 35.0–47.0)
HGB: 13 g/dL (ref 12.0–16.0)
Lymphocyte #: 0.8 x10 3/mm — ABNORMAL LOW (ref 1.0–3.6)
Lymphocyte %: 22.5 %
MCH: 31.5 pg (ref 26.0–34.0)
MCHC: 32.3 g/dL (ref 32.0–36.0)
MCV: 97 fL (ref 80–100)
Monocyte #: 0.5 x10 3/mm (ref 0.2–0.9)
Monocyte %: 14.2 %
NEUTROS ABS: 2.3 x10 3/mm (ref 1.4–6.5)
NEUTROS PCT: 61 %
Platelet: 357 x10 3/mm (ref 150–440)
RBC: 4.13 10*6/uL (ref 3.80–5.20)
RDW: 14 % (ref 11.5–14.5)
WBC: 3.7 x10 3/mm (ref 3.6–11.0)

## 2014-01-03 ENCOUNTER — Ambulatory Visit: Payer: Self-pay | Admitting: Vascular Surgery

## 2014-01-04 ENCOUNTER — Ambulatory Visit: Payer: Self-pay | Admitting: Oncology

## 2014-01-11 LAB — COMPREHENSIVE METABOLIC PANEL
ANION GAP: 6 — AB (ref 7–16)
Albumin: 3.3 g/dL — ABNORMAL LOW (ref 3.4–5.0)
Alkaline Phosphatase: 77 U/L
BILIRUBIN TOTAL: 0.3 mg/dL (ref 0.2–1.0)
BUN: 18 mg/dL (ref 7–18)
CHLORIDE: 105 mmol/L (ref 98–107)
CO2: 29 mmol/L (ref 21–32)
CREATININE: 1.18 mg/dL (ref 0.60–1.30)
Calcium, Total: 8.6 mg/dL (ref 8.5–10.1)
EGFR (African American): 56 — ABNORMAL LOW
EGFR (Non-African Amer.): 46 — ABNORMAL LOW
Glucose: 104 mg/dL — ABNORMAL HIGH (ref 65–99)
Osmolality: 282 (ref 275–301)
POTASSIUM: 3.8 mmol/L (ref 3.5–5.1)
SGOT(AST): 18 U/L (ref 15–37)
SGPT (ALT): 15 U/L
Sodium: 140 mmol/L (ref 136–145)
Total Protein: 7 g/dL (ref 6.4–8.2)

## 2014-01-11 LAB — CBC CANCER CENTER
BASOS PCT: 1.6 %
Basophil #: 0.1 x10 3/mm (ref 0.0–0.1)
EOS ABS: 0.1 x10 3/mm (ref 0.0–0.7)
Eosinophil %: 1.3 %
HCT: 37.7 % (ref 35.0–47.0)
HGB: 12.3 g/dL (ref 12.0–16.0)
LYMPHS ABS: 1.1 x10 3/mm (ref 1.0–3.6)
LYMPHS PCT: 24.7 %
MCH: 31.4 pg (ref 26.0–34.0)
MCHC: 32.7 g/dL (ref 32.0–36.0)
MCV: 96 fL (ref 80–100)
MONO ABS: 0.6 x10 3/mm (ref 0.2–0.9)
MONOS PCT: 13.2 %
NEUTROS ABS: 2.6 x10 3/mm (ref 1.4–6.5)
NEUTROS PCT: 59.2 %
Platelet: 377 x10 3/mm (ref 150–440)
RBC: 3.92 10*6/uL (ref 3.80–5.20)
RDW: 13.8 % (ref 11.5–14.5)
WBC: 4.4 x10 3/mm (ref 3.6–11.0)

## 2014-01-12 LAB — CA 125: CA 125: 59.6 U/mL — ABNORMAL HIGH (ref 0.0–34.0)

## 2014-01-20 LAB — CBC CANCER CENTER
Basophil #: 0 x10 3/mm (ref 0.0–0.1)
Basophil %: 1 %
Eosinophil #: 0 x10 3/mm (ref 0.0–0.7)
Eosinophil %: 0.8 %
HCT: 36.5 % (ref 35.0–47.0)
HGB: 12 g/dL (ref 12.0–16.0)
Lymphocyte #: 0.8 x10 3/mm — ABNORMAL LOW (ref 1.0–3.6)
Lymphocyte %: 25.9 %
MCH: 31.4 pg (ref 26.0–34.0)
MCHC: 32.7 g/dL (ref 32.0–36.0)
MCV: 96 fL (ref 80–100)
Monocyte #: 0.5 x10 3/mm (ref 0.2–0.9)
Monocyte %: 15.4 %
Neutrophil #: 1.8 x10 3/mm (ref 1.4–6.5)
Neutrophil %: 56.9 %
Platelet: 373 x10 3/mm (ref 150–440)
RBC: 3.8 10*6/uL (ref 3.80–5.20)
RDW: 13.8 % (ref 11.5–14.5)
WBC: 3.1 x10 3/mm — ABNORMAL LOW (ref 3.6–11.0)

## 2014-01-27 LAB — CBC CANCER CENTER
BASOS ABS: 0 x10 3/mm (ref 0.0–0.1)
BASOS PCT: 2 %
Eosinophil #: 0.1 x10 3/mm (ref 0.0–0.7)
Eosinophil %: 3 %
HCT: 34.8 % — ABNORMAL LOW (ref 35.0–47.0)
HGB: 11.3 g/dL — AB (ref 12.0–16.0)
LYMPHS ABS: 0.9 x10 3/mm — AB (ref 1.0–3.6)
Lymphocyte %: 35.6 %
MCH: 31.5 pg (ref 26.0–34.0)
MCHC: 32.5 g/dL (ref 32.0–36.0)
MCV: 97 fL (ref 80–100)
MONOS PCT: 10.5 %
Monocyte #: 0.3 x10 3/mm (ref 0.2–0.9)
NEUTROS ABS: 1.2 x10 3/mm — AB (ref 1.4–6.5)
Neutrophil %: 48.9 %
PLATELETS: 330 x10 3/mm (ref 150–440)
RBC: 3.59 10*6/uL — ABNORMAL LOW (ref 3.80–5.20)
RDW: 13.4 % (ref 11.5–14.5)
WBC: 2.5 x10 3/mm — ABNORMAL LOW (ref 3.6–11.0)

## 2014-02-03 LAB — CBC CANCER CENTER
BASOS PCT: 1.1 %
Basophil #: 0 x10 3/mm (ref 0.0–0.1)
EOS PCT: 0.7 %
Eosinophil #: 0 x10 3/mm (ref 0.0–0.7)
HCT: 35.7 % (ref 35.0–47.0)
HGB: 11.6 g/dL — ABNORMAL LOW (ref 12.0–16.0)
LYMPHS ABS: 0.9 x10 3/mm — AB (ref 1.0–3.6)
LYMPHS PCT: 37.9 %
MCH: 31.6 pg (ref 26.0–34.0)
MCHC: 32.4 g/dL (ref 32.0–36.0)
MCV: 98 fL (ref 80–100)
MONO ABS: 0.3 x10 3/mm (ref 0.2–0.9)
Monocyte %: 14.9 %
NEUTROS ABS: 1 x10 3/mm — AB (ref 1.4–6.5)
Neutrophil %: 45.4 %
Platelet: 383 x10 3/mm (ref 150–440)
RBC: 3.66 10*6/uL — AB (ref 3.80–5.20)
RDW: 13.9 % (ref 11.5–14.5)
WBC: 2.3 x10 3/mm — ABNORMAL LOW (ref 3.6–11.0)

## 2014-02-03 LAB — IRON AND TIBC
IRON: 37 ug/dL — AB (ref 50–170)
Iron Bind.Cap.(Total): 344 ug/dL (ref 250–450)
Iron Saturation: 11 %
Unbound Iron-Bind.Cap.: 307 ug/dL

## 2014-02-03 LAB — FERRITIN: FERRITIN (ARMC): 48 ng/mL (ref 8–388)

## 2014-02-03 LAB — TSH: Thyroid Stimulating Horm: 1.99 u[IU]/mL

## 2014-02-04 ENCOUNTER — Ambulatory Visit: Payer: Self-pay | Admitting: Oncology

## 2014-02-06 LAB — BETA STREP CULTURE(ARMC)

## 2014-02-10 LAB — CBC CANCER CENTER
Basophil #: 0.1 x10 3/mm (ref 0.0–0.1)
Basophil %: 1.5 %
EOS PCT: 1.2 %
Eosinophil #: 0 x10 3/mm (ref 0.0–0.7)
HCT: 36.8 % (ref 35.0–47.0)
HGB: 12.2 g/dL (ref 12.0–16.0)
Lymphocyte #: 1 x10 3/mm (ref 1.0–3.6)
Lymphocyte %: 28 %
MCH: 31.6 pg (ref 26.0–34.0)
MCHC: 33 g/dL (ref 32.0–36.0)
MCV: 96 fL (ref 80–100)
MONOS PCT: 16.5 %
Monocyte #: 0.6 x10 3/mm (ref 0.2–0.9)
NEUTROS ABS: 2 x10 3/mm (ref 1.4–6.5)
Neutrophil %: 52.8 %
PLATELETS: 474 x10 3/mm — AB (ref 150–440)
RBC: 3.85 10*6/uL (ref 3.80–5.20)
RDW: 14.1 % (ref 11.5–14.5)
WBC: 3.7 x10 3/mm (ref 3.6–11.0)

## 2014-02-10 LAB — COMPREHENSIVE METABOLIC PANEL
ALBUMIN: 3.3 g/dL — AB (ref 3.4–5.0)
ALK PHOS: 83 U/L
Anion Gap: 7 (ref 7–16)
BUN: 14 mg/dL (ref 7–18)
Bilirubin,Total: 0.3 mg/dL (ref 0.2–1.0)
CHLORIDE: 105 mmol/L (ref 98–107)
Calcium, Total: 8.6 mg/dL (ref 8.5–10.1)
Co2: 30 mmol/L (ref 21–32)
Creatinine: 1.01 mg/dL (ref 0.60–1.30)
EGFR (African American): >60
GFR CALC NON AF AMER: 56 — AB
Glucose: 78 mg/dL (ref 65–99)
Osmolality: 282 (ref 275–301)
Potassium: 3.9 mmol/L (ref 3.5–5.1)
SGOT(AST): 17 U/L (ref 15–37)
SGPT (ALT): 18 U/L
Sodium: 142 mmol/L (ref 136–145)
Total Protein: 7.1 g/dL (ref 6.4–8.2)

## 2014-02-10 LAB — MAGNESIUM: Magnesium: 1.6 mg/dL — ABNORMAL LOW

## 2014-02-11 LAB — CA 125: CA 125: 32.1 U/mL (ref 0.0–34.0)

## 2014-02-17 LAB — CBC CANCER CENTER
BASOS ABS: 0 x10 3/mm (ref 0.0–0.1)
Basophil %: 1.2 %
Eosinophil #: 0.1 x10 3/mm (ref 0.0–0.7)
Eosinophil %: 1.3 %
HCT: 35.4 % (ref 35.0–47.0)
HGB: 11.5 g/dL — ABNORMAL LOW (ref 12.0–16.0)
LYMPHS ABS: 0.9 x10 3/mm — AB (ref 1.0–3.6)
Lymphocyte %: 20.6 %
MCH: 31.3 pg (ref 26.0–34.0)
MCHC: 32.6 g/dL (ref 32.0–36.0)
MCV: 96 fL (ref 80–100)
MONO ABS: 0.4 x10 3/mm (ref 0.2–0.9)
Monocyte %: 9 %
NEUTROS ABS: 2.9 x10 3/mm (ref 1.4–6.5)
Neutrophil %: 67.9 %
Platelet: 393 x10 3/mm (ref 150–440)
RBC: 3.69 10*6/uL — ABNORMAL LOW (ref 3.80–5.20)
RDW: 13.9 % (ref 11.5–14.5)
WBC: 4.3 x10 3/mm (ref 3.6–11.0)

## 2014-02-17 LAB — MAGNESIUM: Magnesium: 1.7 mg/dL — ABNORMAL LOW

## 2014-02-24 LAB — COMPREHENSIVE METABOLIC PANEL
ALBUMIN: 3.1 g/dL — AB (ref 3.4–5.0)
ALK PHOS: 75 U/L
ALT: 16 U/L
Anion Gap: 7 (ref 7–16)
BILIRUBIN TOTAL: 0.3 mg/dL (ref 0.2–1.0)
BUN: 15 mg/dL (ref 7–18)
CO2: 28 mmol/L (ref 21–32)
CREATININE: 0.96 mg/dL (ref 0.60–1.30)
Calcium, Total: 8.5 mg/dL (ref 8.5–10.1)
Chloride: 105 mmol/L (ref 98–107)
EGFR (African American): >60
GFR CALC NON AF AMER: 59 — AB
GLUCOSE: 88 mg/dL (ref 65–99)
Osmolality: 280 (ref 275–301)
Potassium: 4 mmol/L (ref 3.5–5.1)
SGOT(AST): 14 U/L — ABNORMAL LOW (ref 15–37)
Sodium: 140 mmol/L (ref 136–145)
Total Protein: 6.7 g/dL (ref 6.4–8.2)

## 2014-02-24 LAB — CBC CANCER CENTER
BASOS ABS: 0 x10 3/mm (ref 0.0–0.1)
Basophil %: 1.3 %
EOS ABS: 0 x10 3/mm (ref 0.0–0.7)
Eosinophil %: 0.8 %
HCT: 35.5 % (ref 35.0–47.0)
HGB: 11.7 g/dL — ABNORMAL LOW (ref 12.0–16.0)
LYMPHS PCT: 30.7 %
Lymphocyte #: 1 x10 3/mm (ref 1.0–3.6)
MCH: 31.9 pg (ref 26.0–34.0)
MCHC: 33 g/dL (ref 32.0–36.0)
MCV: 97 fL (ref 80–100)
MONOS PCT: 8.9 %
Monocyte #: 0.3 x10 3/mm (ref 0.2–0.9)
NEUTROS PCT: 58.3 %
Neutrophil #: 2 x10 3/mm (ref 1.4–6.5)
PLATELETS: 366 x10 3/mm (ref 150–440)
RBC: 3.67 10*6/uL — ABNORMAL LOW (ref 3.80–5.20)
RDW: 14.2 % (ref 11.5–14.5)
WBC: 3.4 x10 3/mm — ABNORMAL LOW (ref 3.6–11.0)

## 2014-02-24 LAB — MAGNESIUM: Magnesium: 1.6 mg/dL — ABNORMAL LOW

## 2014-02-28 LAB — CA 125: CA 125: 27.9 U/mL (ref 0.0–34.0)

## 2014-03-07 ENCOUNTER — Ambulatory Visit: Payer: Self-pay | Admitting: Oncology

## 2014-03-10 LAB — COMPREHENSIVE METABOLIC PANEL
AST: 16 U/L (ref 15–37)
Albumin: 3.3 g/dL — ABNORMAL LOW (ref 3.4–5.0)
Alkaline Phosphatase: 77 U/L (ref 46–116)
Anion Gap: 8 (ref 7–16)
BILIRUBIN TOTAL: 0.2 mg/dL (ref 0.2–1.0)
BUN: 20 mg/dL — ABNORMAL HIGH (ref 7–18)
CO2: 28 mmol/L (ref 21–32)
Calcium, Total: 8.6 mg/dL (ref 8.5–10.1)
Chloride: 105 mmol/L (ref 98–107)
Creatinine: 0.91 mg/dL (ref 0.60–1.30)
Glucose: 94 mg/dL (ref 65–99)
Osmolality: 284 (ref 275–301)
Potassium: 4.1 mmol/L (ref 3.5–5.1)
SGPT (ALT): 15 U/L (ref 14–63)
Sodium: 141 mmol/L (ref 136–145)
Total Protein: 7 g/dL (ref 6.4–8.2)

## 2014-03-10 LAB — URINALYSIS, COMPLETE
Bilirubin,UR: NEGATIVE
Blood: NEGATIVE
GLUCOSE, UR: NEGATIVE mg/dL (ref 0–75)
KETONE: NEGATIVE
Nitrite: NEGATIVE
Ph: 6 (ref 4.5–8.0)
RBC,UR: 10 /HPF (ref 0–5)
SPECIFIC GRAVITY: 1.017 (ref 1.003–1.030)
Squamous Epithelial: 13

## 2014-03-10 LAB — CBC CANCER CENTER
BASOS PCT: 1.3 %
Basophil #: 0.1 x10 3/mm (ref 0.0–0.1)
EOS ABS: 0 x10 3/mm (ref 0.0–0.7)
Eosinophil %: 0.9 %
HCT: 36.3 % (ref 35.0–47.0)
HGB: 12 g/dL (ref 12.0–16.0)
Lymphocyte #: 1.6 x10 3/mm (ref 1.0–3.6)
Lymphocyte %: 29.2 %
MCH: 31.7 pg (ref 26.0–34.0)
MCHC: 33.1 g/dL (ref 32.0–36.0)
MCV: 96 fL (ref 80–100)
MONO ABS: 0.9 x10 3/mm (ref 0.2–0.9)
Monocyte %: 16.4 %
Neutrophil #: 2.8 x10 3/mm (ref 1.4–6.5)
Neutrophil %: 52.2 %
PLATELETS: 429 x10 3/mm (ref 150–440)
RBC: 3.79 10*6/uL — ABNORMAL LOW (ref 3.80–5.20)
RDW: 15.1 % — AB (ref 11.5–14.5)
WBC: 5.4 x10 3/mm (ref 3.6–11.0)

## 2014-03-12 LAB — URINE CULTURE

## 2014-04-05 ENCOUNTER — Ambulatory Visit
Admit: 2014-04-05 | Disposition: A | Discharge status: Home / Self Care | Payer: Self-pay | Attending: Oncology | Admitting: Oncology

## 2014-04-12 DIAGNOSIS — E782 Mixed hyperlipidemia: Secondary | ICD-10-CM | POA: Insufficient documentation

## 2014-04-12 DIAGNOSIS — I471 Supraventricular tachycardia: Secondary | ICD-10-CM | POA: Insufficient documentation

## 2014-04-28 LAB — COMPREHENSIVE METABOLIC PANEL
ALT: 12 U/L — AB
AST: 25 U/L
Albumin: 3.3 g/dL — ABNORMAL LOW
Alkaline Phosphatase: 46 U/L
Anion Gap: 7 (ref 7–16)
BUN: 19 mg/dL
Bilirubin,Total: 0.7 mg/dL
CO2: 27 mmol/L
CREATININE: 0.82 mg/dL
Calcium, Total: 8.7 mg/dL — ABNORMAL LOW
Chloride: 105 mmol/L
EGFR (African American): >60
EGFR (Non-African Amer.): >60
Glucose: 129 mg/dL — ABNORMAL HIGH
Potassium: 3.6 mmol/L
Sodium: 139 mmol/L
Total Protein: 6.3 g/dL — ABNORMAL LOW

## 2014-04-28 LAB — URINALYSIS, COMPLETE
BILIRUBIN, UR: NEGATIVE
Glucose,UR: NEGATIVE mg/dL (ref 0–75)
Ketone: NEGATIVE
NITRITE: NEGATIVE
PH: 6 (ref 4.5–8.0)
Protein: >=500
RBC,UR: 53 /HPF (ref 0–5)
SQUAMOUS EPITHELIAL: NONE SEEN
Specific Gravity: 1.016 (ref 1.003–1.030)
WBC UR: 750 /HPF (ref 0–5)

## 2014-04-28 LAB — CBC CANCER CENTER
Basophil #: 0.1 x10 3/mm (ref 0.0–0.1)
Basophil %: 1.5 %
Eosinophil #: 0.1 x10 3/mm (ref 0.0–0.7)
Eosinophil %: 1 %
HCT: 35.9 % (ref 35.0–47.0)
HGB: 11.8 g/dL — ABNORMAL LOW (ref 12.0–16.0)
Lymphocyte #: 1.3 x10 3/mm (ref 1.0–3.6)
Lymphocyte %: 22 %
MCH: 31.5 pg (ref 26.0–34.0)
MCHC: 33 g/dL (ref 32.0–36.0)
MCV: 96 fL (ref 80–100)
MONOS PCT: 5.6 %
Monocyte #: 0.3 x10 3/mm (ref 0.2–0.9)
NEUTROS ABS: 4.2 x10 3/mm (ref 1.4–6.5)
Neutrophil %: 69.9 %
Platelet: 340 x10 3/mm (ref 150–440)
RBC: 3.76 10*6/uL — AB (ref 3.80–5.20)
RDW: 16.2 % — ABNORMAL HIGH (ref 11.5–14.5)
WBC: 5.9 x10 3/mm (ref 3.6–11.0)

## 2014-04-29 LAB — URINE CULTURE

## 2014-05-06 ENCOUNTER — Ambulatory Visit
Admit: 2014-05-06 | Disposition: A | Discharge status: Home / Self Care | Payer: Self-pay | Attending: Oncology | Admitting: Oncology

## 2014-05-12 LAB — COMPREHENSIVE METABOLIC PANEL
ALK PHOS: 49 U/L
ALT: 13 U/L — AB
AST: 24 U/L
Albumin: 3.5 g/dL
Anion Gap: 6 — ABNORMAL LOW (ref 7–16)
BILIRUBIN TOTAL: 0.5 mg/dL
BUN: 16 mg/dL
CALCIUM: 8.7 mg/dL — AB
CO2: 25 mmol/L
Chloride: 106 mmol/L
Creatinine: 0.87 mg/dL
EGFR (African American): >60
Glucose: 116 mg/dL — ABNORMAL HIGH
POTASSIUM: 3.4 mmol/L — AB
SODIUM: 137 mmol/L
Total Protein: 6.5 g/dL

## 2014-05-12 LAB — CBC CANCER CENTER
BASOS PCT: 1 %
Basophil #: 0 x10 3/mm (ref 0.0–0.1)
Eosinophil #: 0.1 x10 3/mm (ref 0.0–0.7)
Eosinophil %: 1.4 %
HCT: 35.4 % (ref 35.0–47.0)
HGB: 11.8 g/dL — AB (ref 12.0–16.0)
LYMPHS PCT: 33.6 %
Lymphocyte #: 1.4 x10 3/mm (ref 1.0–3.6)
MCH: 31.6 pg (ref 26.0–34.0)
MCHC: 33.2 g/dL (ref 32.0–36.0)
MCV: 95 fL (ref 80–100)
Monocyte #: 0.7 x10 3/mm (ref 0.2–0.9)
Monocyte %: 17.7 %
NEUTROS ABS: 1.9 x10 3/mm (ref 1.4–6.5)
Neutrophil %: 46.3 %
Platelet: 415 x10 3/mm (ref 150–440)
RBC: 3.72 10*6/uL — ABNORMAL LOW (ref 3.80–5.20)
RDW: 17.1 % — ABNORMAL HIGH (ref 11.5–14.5)
WBC: 4.1 x10 3/mm (ref 3.6–11.0)

## 2014-05-13 LAB — CA 125: CA 125: 30.7 U/mL (ref 0.0–34.0)

## 2014-05-19 LAB — CBC CANCER CENTER
BASOS ABS: 0 x10 3/mm (ref 0.0–0.1)
Basophil %: 0.6 %
EOS ABS: 0.1 x10 3/mm (ref 0.0–0.7)
Eosinophil %: 0.9 %
HCT: 34.5 % — AB (ref 35.0–47.0)
HGB: 11.2 g/dL — AB (ref 12.0–16.0)
Lymphocyte #: 1.5 x10 3/mm (ref 1.0–3.6)
Lymphocyte %: 26.9 %
MCH: 30.8 pg (ref 26.0–34.0)
MCHC: 32.4 g/dL (ref 32.0–36.0)
MCV: 95 fL (ref 80–100)
MONOS PCT: 10.1 %
Monocyte #: 0.6 x10 3/mm (ref 0.2–0.9)
NEUTROS PCT: 61.5 %
Neutrophil #: 3.5 x10 3/mm (ref 1.4–6.5)
Platelet: 381 x10 3/mm (ref 150–440)
RBC: 3.63 10*6/uL — ABNORMAL LOW (ref 3.80–5.20)
RDW: 17.1 % — AB (ref 11.5–14.5)
WBC: 5.7 x10 3/mm (ref 3.6–11.0)

## 2014-05-24 NOTE — Op Note (Signed)
PATIENT NAMEValeen, [name] MR#:  [id number] DATE OF BIRTH:  [birth year]  DATE OF PROCEDURE:  10/24/2011  PREOPERATIVE DIAGNOSIS: Right inguinal adenopathy.  POSTOPERATIVE DIAGNOSIS: Right inguinal adenopathy, right femoral hernia.   PROCEDURE: Excision of right inguinal lymph node, right femoral hernia repair.  SURGEON: Rochel Brome, MD  ANESTHESIA: General, local 1% Xylocaine with epinephrine.   INDICATIONS: This 81 year old female has a history of ovarian cancer and recent development of adenopathy in the right groin. A large lymph node was demonstrated on CT scan and this was palpable on examination, at the same site. This was adjacent to the femoral vessels.   DESCRIPTION OF PROCEDURE: The patient was placed on the operating table in the supine position under general anesthesia. The right groin was clipped and then prepared with ChloraPrep and draped in a sterile manner. I reviewed the CT images prior to surgery. There was a palpable mass just medial to the site of femoral pulse. The skin overlying this was infiltrated with 1% Xylocaine with epinephrine. A transversely, slightly obliquely oriented 4 cm incision was made, carried down through subcutaneous tissues, and encountered a fatty mass, which was dissected free from surrounding structures and coming up out of the deep fascia and demonstrated a femoral hernia. I also retracted this and exposed a hard, rounded lymph node which was consistent with 1.9 cm in dimension. This was dissected free from surrounding structures using electrocautery for hemostasis. There was one arterial bleeding point, which was suture ligated with 4-0 Vicryl. The lymph node was completely excised and was submitted fresh for pathology. The wound was inspected. Several small bleeding points were cauterized. Next, the femoral hernia sac was dissected free from surrounding structures. It was approximately 4 cm in length. It was dissected down into the fascial ring defect  and did not contain any intestine and a 4-0 Vicryl suture ligature was placed and this was doubly ligated and the sac was excised and subsequently reduced the stump down through the fascial defect. The fascial defect itself was approximately 8 mm in dimension and was closed with 0 Surgilon figure-of-eight sutures. The repair looked good. Hemostasis was intact. The Scarpa's fascia was closed with 4-0 chromic and    the skin was closed with running 4-0 chromic subcuticular suture and Dermabond. The patient tolerated surgery satisfactorily and was then prepared for transfer to the recovery room.  ____________________________ Lenna Sciara. Rochel Brome, MD jws:slb D: 10/24/2011 11:13:37 ET T: 10/24/2011 14:49:59 ET JOB#: [id number]  cc: Loreli Dollar, MD, <Dictator> Loreli Dollar MD ELECTRONICALLY SIGNED 10/24/2011 18:23

## 2014-05-27 NOTE — H&P (Signed)
PATIENT NAME:  Nicole Clayton, Nicole Clayton MR#:  [id number] DATE OF BIRTH:  [birth year]  DATE OF ADMISSION:  03/19/2012  PRIMARY ONCOLOGIST: Dr. Oliva Bustard   PRIMARY CARE PHYSICIAN: Dr. Ezequiel Kayser  CHIEF COMPLAINT: Edema and erythema of the right lower extremity.   REFERRING PHYSICIAN: Christine Braud   HISTORY OF PRESENT ILLNESS: An 81 year old female with a history of adenocarcinoma of the fallopian tube, stage III, who has been on chemotherapy with Dr. Oliva Bustard. She also has thrombocytosis, neuropathy, osteoporosis, mitral valve prolapse and other problems that are being controlled.   The patient came last Sunday after calling the [facility]. She was told to come over here because she had a fever of 100 degrees and her blood count was low after they checked her blood levels.   The patient at that moment did not have any significant symptomatology other than just edema of the lower extremities. She did not have any cough, difficulty urinating, shortness of breath or other problems in general.   The patient was checked in the ER. Flu test was done, and it was negative. The patient was discharged in good condition.   Today, she comes with a history of 24 hours of increased swelling of the right lower extremity with erythema and occasional sharp pains of the lower extremity.   The patient states that the pain is just mild. It is 3 to 4 on a scale of 1 to 10, although it comes and goes occasionally.   She has not had any fever today, but on Sunday, it was 100 degrees.   Today, her temperature is 98. She states that she is otherwise feeling okay. She had a sister, who was recently diagnosed with cellulitis with MRSA, and she visited a couple of days ago.   Other than that, the patient denies any other major problems. She is admitted, and treatment with vancomycin has been initiated after blood cultures have been taken.   REVIEW OF SYSTEMS: CONSTITUTIONAL: No fever at this moment. She was 100 degrees on  Sunday. No weakness or weight loss.  EYES: No significant changes of vision. The patient states that she is getting a little bit of swelling around the eyes, but that is chronic.  ENT: No tinnitus. No difficulty swallowing.  RESPIRATORY: No cough. No wheezing. No hemoptysis. No COPD.  CARDIOVASCULAR: No chest pain. No orthopnea. Positive edema, which is chronic, for which the patient has been prescribed Lasix, but she does not take it. She does have a history of mitral prolapse and occasional irregular heart rate, but overall, she has never been diagnosed with a formal arrhythmia.  GASTROINTESTINAL: No nausea, vomiting or diarrhea. No jaundice or GI bleeding.  GENITOURINARY: No hematuria, dysuria or increase in frequency.  GYNECOLOGIC: Positive GYN cancer with adenocarcinoma of the fallopian tube, on current treatment with chemotherapy.  BREASTS: No breast masses.  ENDOCRINE: No polyuria, polydipsia or polyphagia. No cold or heat intolerance. HEMATOLOGIC/LYMPHATIC: No easy bruising or bleeding. No swollen glands. Positive chronic anemia.  SKIN: No petechiae or lesions. Only the rash located in the right lower extremity.  MUSCULOSKELETAL: No significant neck pain, back pain or gout.  NEUROLOGIC: No ataxia. The patient apparently had a TIA 20 years ago. She used to be on aspirin, but she has been taken off of aspirin for her chemotherapy.  PSYCHIATRIC: Negative for insomnia or depression.   PAST MEDICAL HISTORY: 1.  Adenocarcinoma of the fallopian tube.  2.  History of multiple UTIs, for which the patient takes prophylaxis.  In the past, she had ESBL E. coli sensitive to meropenem and Invanz.  4.  Dermatosis.  5.  Peripheral neuropathy.  6.  Osteoarthritis.  7.  Osteoporosis.  8.  History of mitral valve prolapse.  9.  TIA.  10. Hypertension, off her medications for over a year.   PAST SURGICAL HISTORY: 1.  Bladder suspension x3.  2.  BSO.  3.  Wrist fracture.  4.  Appendectomy.  5.   Cholecystectomy.  6.  Left hand surgery for carpal tunnel syndrome.  7.  Partial hysterectomy.   ALLERGIES: THE PATIENT IS ALLERGIC TO MACRODANTIN AND SULFA DRUGS.   FAMILY HISTORY: Positive for hypertension in her father. CVA also in her father and an aneurysm of the heart in her mom who died at the age of 76.   SOCIAL HISTORY: Negative for alcohol or tobacco. The patient is with her husband, and she lives next door to her daughter.   MEDICATIONS: 1.  Macrobid 100 mg twice daily.  2.  Alfuzosin 10 mg once daily.  3.  Zofran 4 mg p.r.n. nausea with chemotherapy.  4.  Gabapentin 600 mg 3 times daily.  5.  Pantoprazole 40 mg once daily. 6.  Docusate 200 mg p.r.n. 7.  Fluticasone nasal spray 2 sprays in each nostril.  8.  Tums p.r.n. 9.  Tylenol p.r.n.  10. Zyrtec 10 mg p.r.n.   The patient also had a prescription for Lasix that she is not taking. She does not recall what the dose is.   PHYSICAL EXAMINATION: VITAL SIGNS: Blood pressure 151/69, pulse 76, respiratory 20, temperature 98.  GENERAL: The patient is alert, oriented x3, no acute distress, no respiratory distress, hemodynamically stable.  HEENT: Pupils are equal and reactive. Extraocular movements are intact. Mucosa is moist.  NECK: Supple. No JVD. No thyromegaly. No adenopathy. No carotid bruits. No rigidity.  CARDIOVASCULAR: Regular rate and rhythm. Positive systolic ejection murmur 2/6. No rubs or gallops. No displacement of PMI.  LUNGS: Clear without any wheezing or crepitus. No dullness to percussion. No use of accessory muscles.  ABDOMEN: Soft, nontender, nondistended. No hepatosplenomegaly. No masses. Bowel sounds are positive.  GENITAL EXAM: Deferred.  EXTREMITIES: Positive edema bilateral extremities, right more than left, erythema with streaking going up into the upper middle thigh. There is no secretion. No seeping of the skin at this moment. Capillary refill less than 3 seconds. Pulses +2.  NEUROLOGIC: Cranial  nerves II-XII intact.  PSYCHIATRIC: Negative for depression, anxiety or agitation.  MUSCULOSKELETAL: No significant joint effusions or edema of joints.  SKIN: Rash as mentioned above.   LABORATORY DATA: Glucose 93, sodium 141, potassium 3.5, LFTs with an albumin of 3.1, rest of the LFTs are normal.  White blood count 3.9, hemoglobin is 9.6, platelets of 301, INR 1. UA: Red blood cells 1, white blood cells 1.   Ultrasound of the lower extremities for DVT is negative.   ASSESSMENT AND PLAN: An 81 year old female with history of cancer, who is on chemotherapy, receiving her 6th session on the 4th cycle. The patient comes with cellulitis of the right lower extremity.  1.  Cellulitis of right lower extremity. The patient is treated with vancomycin. Blood cultures taken. Follow up on cultures. The patient is not septic or toxic at this moment, but she has significant streaking of the right lower extremity or superficial circulation. At this moment, I am going to continue the vancomycin, consider adding on Invanz if cultures are coming with E. coli, as her E.  coli has been ESBL. It is difficult for her to have cellulitis of E. coli but not impossible, as the patient has significant edema and breakdown of the skin barrier. The patient is colonized with ESBL organisms.  2.  Cancer. Continue treatment with Dr. Oliva Bustard. Dr. Oliva Bustard is on vacation right now, and he will not be back until a week from now. If needed, we will consult one of his partners. 3.  Thrombocytosis, stable. Decreased white blood cells. They are not pancytopenic at this moment. There are white cells at 2.5, which we are going to continue to monitor.  4.  Mitral valve prolapse, stable. Follow with Dr. Nehemiah Massed.  5.  Edema of the lower extremities. This is due to her valvulopathy. She always has edema, and she has not been taking Lasix. We are going to restart her Lasix. Her kidney function is normal. 6.  Other medical problems are stable.    TIME SPENT: I spent about 40 minutes with this patient.   CODE STATUS: The patient is a FULL CODE right now.   PROPHYLAXIS: For DVT prophylaxis, I am going to add on heparin.    ____________________________ Philmont Sink, MD rsg:lg D: 03/19/2012 18:32:06 ET T: 03/19/2012 18:58:19 ET JOB#: [id number]  cc: [place] Sink, MD, <Dictator> Bodee Lafoe America Brown MD ELECTRONICALLY SIGNED 03/25/2012 14:21

## 2014-05-27 NOTE — Consult Note (Signed)
History of Present Illness:  Reason for Consult Recurrent ovarian cancer on chemotherapy.   HPI   Patient is an 81 year old female with a history of recurrent ovarian cancer who last received chemotherapy with gemcitabine on March 12, 2012.  Patient came to the emergency room with complaints of fever, swelling, and erythema of her right lower extremity.  She otherwise has felt well.  She has no neurologic complaints.  She denies any chest pain or shortness of breath.  She has good appetite and denies any nausea, vomiting, constipation, or diarrhea.  Patient states the erythema and swelling of her right leg have significantly improved since admission.  She offers no further specific complaints.  PFSH:  Additional Past Medical and Surgical History Past medical history: Recurrent UTI, peripheral neuropathy, osteoarthritis, osteoporosis, MVP, TIA, hypertension.  Past surgical history: Bladder suspension x3, BSO.  Social history: Patient denies tobacco or alcohol.  Family history: Negative and noncontributory.   Review of Systems:  Performance Status (ECOG) 1   Review of Systems   As per HPI. Otherwise, 10 point system review was negative.   NURSING NOTES: **Vital Signs.:   14-Feb-14 14:25   Vital Signs Type: Routine   Temperature Temperature (F): 98   Celsius: 36.6   Temperature Source: oral   Pulse Pulse: 77   Respirations Respirations: 20   Systolic BP Systolic BP: 825   Diastolic BP (mmHg) Diastolic BP (mmHg): 82   Mean BP: 98   Pulse Ox % Pulse Ox %: 93   Pulse Ox Activity Level: At rest   Oxygen Delivery: Room Air/ 21 %   Physical Exam:  Physical Exam General: Well-developed, well-nourished, no acute distress. Eyes: Anicteric sclera. Lungs: Clear to auscultation bilaterally. Heart: Regular rate and rhythm. No rubs, murmurs, or gallops. Abdomen: Soft, normoactive bowel sounds. Musculoskeletal: No edema, cyanosis, or clubbing. Neuro: Alert, answering all  questions appropriately. Cranial nerves grossly intact. Skin: right lower extremity with erythema, warmth, and edema.  Per patient improved. Psych: Normal affect.    Macrodantin: Rash  Sulfa drugs: Unknown    Tamiflu: 1   2 times a day, Status: Active, Quantity: 10, Refills: None   Macrobid macrocrystals-monohydrate 100 mg oral capsule: 1 cap(s) orally 2 times a day, Status: Active, Quantity: 60, Refills: 3   alfuzosin 10 mg oral tablet, extended release: 1 tab(s) orally once a day, Status: Active, Quantity: 0, Refills: None   Zofran 4 mg oral tablet: 1 tab(s) orally every 8 hours, As Needed, Status: Active, Quantity: 45, Refills: None   gabapentin 300 mg capsule: 2 cap(s) orally 3 times a day, Status: Active, Quantity: 180, Refills: 2   pantoprazole 40 mg oral delayed release tablet: 1 tab(s) orally once a day (in the morning), Status: Active, Quantity: 0, Refills: None   docusate sodium: 2 cap(s) orally once a day (at bedtime), As Needed, Status: Active, Quantity: 0, Refills: None   fluticasone 50 mcg/inh nasal spray: 2 spray(s) each nostril once a day (at bedtime), Status: Active, Quantity: 0, Refills: None   Tums Ultra 1000 mg oral tablet, chewable: 1 tab(s) orally , As Needed, Status: Active, Quantity: 0, Refills: None   Tylenol Caplet Extra Strength 500 mg oral tablet: 2 tab(s) orally once a day, As Needed, Status: Active, Quantity: 0, Refills: None   Zyrtec 10 mg oral tablet: 1 tab(s) orally once a day, Status: Active, Quantity: 0, Refills: None  Laboratory Results: Routine Chem:  14-Feb-14 07:22   Result Comment labs - This specimen was collected through  an   - indwelling catheter or arterial line.  - A minimum of 27ms of blood was wasted prior    - to collecting the sample.  Interpret  - results with caution.  Result(s) reported on 20 Mar 2012 at 07:50AM.  Glucose, Serum  100  BUN 13  Creatinine (comp) 0.94  Sodium, Serum 139  Potassium, Serum 3.7  Chloride, Serum  105  CO2, Serum 26  Calcium (Total), Serum 8.9  Anion Gap 8  Osmolality (calc) 278  eGFR (African American) >60  eGFR (Non-African American)  57 (eGFR values <654mmin/1.73 m2 may be an indication of chronic kidney disease (CKD). Calculated eGFR is useful in patients with stable renal function. The eGFR calculation will not be reliable in acutely ill patients when serum creatinine is changing rapidly. It is not useful in  patients on dialysis. The eGFR calculation may not be applicable to patients at the low and high extremes of body sizes, pregnant women, and vegetarians.)  Routine Hem:  14-Feb-14 07:22   WBC (CBC) 3.9  RBC (CBC)  3.21  Hemoglobin (CBC)  10.9  Hematocrit (CBC)  32.9  Platelet Count (CBC) 317  MCV  102  MCH 34.0  MCHC 33.2  RDW  18.1  Neutrophil % 49.4  Lymphocyte % 28.8  Monocyte % 19.7  Eosinophil % 0.4  Basophil % 1.7  Neutrophil # 1.9  Lymphocyte # 1.1  Monocyte # 0.8  Eosinophil # 0.0  Basophil # 0.1   Assessment and Plan: Impression:   Recurrent ovarian cancer, now with right lower leg cellulitis. Plan:   1.  Cellulitis: Improving per patient.  Agree with switching to oral antibiotics and possible discharge home tomorrow. Ultrasound negative for DVT. Ovarian cancer: Patient last received chemotherapy on March 12, 2012.  Her next scheduled chemotherapy is not for several weeks.  [facility], this will be switched to and M.D. visit to ensure resolution of her cellulitis. consult, call questions.    Electronic Signatures: FiDelight HohMD)  (Signed 14-Feb-14 18:16)  Authored: HISTORY OF PRESENT ILLNESS, PFSH, ROS, NURSING NOTES, PE, ALLERGIES, HOME MEDICATIONS, LABS, ASSESSMENT AND PLAN   Last Updated: 14-Feb-14 18:16 by FiDelight HohMD)

## 2014-05-27 NOTE — Discharge Summary (Signed)
PATIENT NAMEMailani, Nicole Clayton MR#:  [id number] DATE OF BIRTH:  [birth year]  DATE OF ADMISSION:  03/19/2012  DATE OF DISCHARGE:  03/21/2012  ADMITTING DIAGNOSIS: Right lower extremity erythema and edema.   DISCHARGE DIAGNOSES:  1. Right lower extremity cellulitis, now improved.  2. A previous history of adenocarcinoma of the fallopian tube.  3. A history of recurrent urinary tract infections for which she takes prophylaxis.  4. A history of dermatosis.  5. Peripheral neuropathy.  6. Osteoarthritis.  7. Osteoporosis.  8. A history of mitral valve prolapse.  9. A history of transient ischemic attack.  10. Hypertension, but been off blood pressure medications for over a year.  11. Status post bladder suspension x 3.  12. Status post bilateral salpingo-oophorectomy.  13. Status post wrist fracture surgery.  14. Status post appendectomy.  15. Status post cholecystectomy.  16. Status post left hand surgery for carpal tunnel syndrome.  17. Status post partial hysterectomy.   PERTINENT LABORATORY AND EVALUATIONS: Admitting WBC count 6.5, hemoglobin 9.6, platelet count was 427. PA and lateral chest x-ray showed a hyperinflation, focal increased density overlying the right midlung, likely felt to be artificial. Ultrasound of the bilateral lower extremities showed no evidence for a DVT. Urine culture showed 100 CFUs of gram-negative rod, nitrites negative, leukocytes negative. Blood cultures x 2 no growth at 8 to 12 hours.   CONSULTANTS: Dr. Grayland Ormond.   HOSPITAL COURSE: Please refer to H and P done by the admitting physician. The patient is an 81 year old white female with a history of adenocarcinoma of the fallopian tube, stage III, who has been on chemotherapy who started having fever of 100 and also was having swelling and erythema of the right lower extremity. The patient was thought to have cellulitis involving her leg. Initially she was started on a broad-spectrum antibiotics. Subsequently she  was switched over to oral Keflex and was given some Lasix for the swelling. She has got significant improvement in the erythema and the swelling, and she is doing much better. She has been afebrile. Her WBC count is normal. And is stable for discharge.   DISCHARGE MEDICATIONS: Protonix 40 daily, gabapentin 600 t.i.d., Colace 2 caps at bedtime as needed, fluticasone 50 mcg 2 sprays to each nostril at bedtime, TUMS 1 tab p.o. daily as needed, Tylenol Extra Strength 2 tabs daily as needed, Zyrtec 10 daily, Zofran 4 mg, 1 tab p.o. every 8 p.r.n.,  alfuzosin 10 mg 1 tab p.o. daily, Macrobid 1 tab p.o. b.i.d., Keflex 500, 1 tabs p.o. q.8, Lasix 20 p.r.n. swelling.   DIET: Regular.   ACTIVITY: As tolerated.   TIMEFRAME FOR FOLLOW UP: With Dr. Dorthula Perfect.   TIME SPENT: 35 minutes spent.   ____________________________ Lafonda Mosses. Posey Pronto, MD shp:jm D: 03/21/2012 14:31:00 ET T: 03/21/2012 16:12:36 ET JOB#: [id number]  cc:   Lafonda Mosses [name] MD ELECTRONICALLY SIGNED 03/27/2012 15:01

## 2014-05-28 NOTE — Op Note (Signed)
PATIENT NAMECamey, Nicole Clayton MR#:  [id number] DATE OF BIRTH:  1928/10/04  DATE OF PROCEDURE:  01/03/2014  PREOPERATIVE DIAGNOSES: 1.  Ovarian cancer.  2.  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  POSTOPERATIVE DIAGNOSES: 1.  Ovarian cancer.  2.  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  PROCEDURES PERFORMED: 1.  Ultrasound guidance for vascular access to right jugular vein.  2.  Catheter placement into the superior vena cava.  3.  Jugular venogram and superior venacavogram.  4.  Placement of a CT compatible Infuse-a-Port via the right jugular vein with fluoroscopic guidance.   SURGEON: Algernon Huxley, MD  ANESTHESIA: Local with moderate conscious sedation.   ESTIMATED BLOOD LOSS: 25 mL  FLUOROSCOPY TIME: About 2 minutes 10 mL of contrast were used.   INDICATION FOR PROCEDURE: This is an 81 year old female who previously had to have a port removed and intervention done to her central venous circulation for central venous thrombosis. She still has ovarian cancer and needs a new Port-A-Cath for chemotherapy access. She is brought in for an attempt at one. Risks and benefits were discussed. Informed consent was obtained.   DESCRIPTION OF THE PROCEDURE: The patient is brought to the vascular suite. The right neck and chest were sterilely prepped and draped and a sterile surgical field was created. The area was copiously anesthetized with 1% lidocaine. The jugular vein in the cervical portion appeared to be patent. It was then accessed under direct ultrasound guidance without difficulty with a Seldinger needle, but a wire would not pass. Initially, a needle venogram was done to do a jugular venogram, which showed marked tortuosity of the jugular vein. It was very difficult to discern if this was the actual jugular vein or a collateral, but there was a circuitous route down to what appeared to be a patent central venous circulation. With a Glidewire and a  Kumpe catheter, I was able to advance down into what appeared to be the right innominate vein and then the superior vena cava. Injection was performed through here and showed brisk flow with patency of the superior vena cava and innominate vein into the right atrium. I then used a Magic torque wire, which removed all the tortuosity in the venous system and advanced the peel-away sheath over this down into the superior vena cava area. Counter incision was made below the right clavicle and we dissected out an area to allow placement of the port. The port was secured to the chest wall with 2 Prolene sutures and connected to the catheter. The catheter was tunneled from the subclavicular incision to the access site using fluoroscopic guidance. It was cut to an appropriate length and placed through the peel-away sheath and the peel-away sheath was removed. I left this a little long to ensure that it does not get caught up in the tortuosity and out of the central venous circulation. The catheter tip was parked just into the right atrium. It withdrew blood well and flushed easily with heparinized saline and had no kinks. The jugular incision was closed with a single 4-0 Monocryl. The subclavicular incision was copiously irrigated with antibiotic-impregnated saline and closed with a 3-0 Vicryl and 4-0 Monocryl. Dermabond was placed as a dressing. The patient was awakened from anesthesia and taken to the recovery room in stable condition having tolerated the procedure well.    ____________________________ Algernon Huxley, MD jsd:sw D: 01/03/2014 14:58:54 ET T: 01/03/2014 16:36:07 ET JOB#: [id number]  cc: Erskine Squibb.  Lucky Cowboy, MD, <Dictator> Algernon Huxley MD ELECTRONICALLY SIGNED 01/16/2014 11:43

## 2014-05-28 NOTE — Op Note (Signed)
PATIENT NAMEJasani, Lengel Clayton F MR#:  [id number] DATE OF BIRTH:  27-Dec-1928  DATE OF PROCEDURE:  09/06/2013  PREOPERATIVE DIAGNOSES:  1. Ovarian cancer.  2. Status post previous Port-A-Cath placement.  3. Central venous thrombosis/occlusion.   POSTOPERATIVE DIAGNOSES:  1. Ovarian cancer.  2. Status post previous Port-A-Cath placement.  3. Central venous thrombosis/occlusion.   PROCEDURES: 1. Ultrasound guidance for vascular access to the left basilic vein.  2. Left upper extremity venogram and central venogram.  3. Percutaneous transluminal angioplasty of the left subclavian vein, innominate vein, and superior vena cava. The subclavian and innominate vein with an 8 and 10 mm balloon, and the superior vena cava with an 8, 10 and 12 mm balloon.  4. Removal of right jugular Port-A-Cath.   SURGEON: Algernon Huxley, M.D.   ANESTHESIA: Local with moderate conscious sedation.   ESTIMATED BLOOD LOSS: Minimal.   CONTRAST USED: 35 mL of Visipaque.   FLUOROSCOPY TIME: Approximately 3 minutes.   INDICATION FOR PROCEDURE: This is an 81 year old female who was referred to [place] for evaluation for central venous thrombosis. It was requested to have her Port-A-Cath removed. This seemed reasonable. Also discussed with her the possibility of improving her central venous occlusion with venogram and percutaneous intervention. She desired to have this done, as well as removal of the port. Risks and benefits were discussed. Informed consent was obtained.   DESCRIPTION OF PROCEDURE: The patient was brought to the vascular suite. Initially the left upper extremity was sterilely prepped and draped, and we prepped out the right neck and chest, as well. Accessed the basilic vein under direct ultrasound guidance with a micropuncture needle without difficulty. A micropuncture wire and sheath were then placed, and I upsized to a 6 [place] sheath. Imaging was performed. The left basilic vein into the axillary vein, and the  initial portion of the subclavian vein were patent. The subclavian vein then had chronic thrombus and occlusion. The innominate vein was not visualized, nor was the superior vena cava, and drainage was through collaterals. I gave the patient 3000 units of intravenous heparin. With a Kumpe catheter and stiff-angled Glidewire, I was able to cross the occlusion in the subclavian, innominate and superior vena cava, and confirm intraluminal flow in the distal superior vena cava and then in the right atrium. I then placed a Magic torque wire. I treated the subclavian vein, innominate vein, all the way down to the superior vena cava with an 8 and then a 10 mm diameter angioplasty balloon. The innominate subclavian veins were better, but there was still very tight stenosis in the superior vena cava, and this was located around the Port-A-Cath. I then anesthetized the area overlying the Port-A-Cath and created an incision from the previous placement, and dissected out the port from the pocket and freed this. The catheter was then removed in its entirety with gentle traction.   After this, I ballooned the superior vena cava with a 12 mm diameter angioplasty balloon with a good angiographic completion result with a Kumpe catheter parked back into the innominate vein to show its completion. There was good flow through this area with some residual stenosis, but the flow was much more brisk, and the collaterals were not prominent.   At this point, I elected to terminate the procedure. The sheath was removed. Pressure was held. A sterile dressing was placed. The port incision was closed with 3-0 Vicryl and 4-0 Monocryl, and Dermabond placed as a dressing. The patient tolerated the procedure well  and was taken to the recovery room in stable condition.    ____________________________ Algernon Huxley, MD jsd:jr D: 09/06/2013 12:45:06 ET T: 09/06/2013 13:40:34 ET JOB#: [id number]  cc: Algernon Huxley, MD, <Dictator> Martie Lee.  Oliva Bustard, MD Algernon Huxley MD ELECTRONICALLY SIGNED 09/14/2013 14:14

## 2014-05-28 NOTE — Consult Note (Signed)
Reason for Visit: This 81 year old Female patient presents to the clinic for initial evaluation of  ovarian cancer .   Referred by Dr. Oliva Bustard.  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  Pathology Report pathology report reviewed   Imaging Report CT scan reviewed   Referral Report clinical notesreviewed   Planned Treatment Regimen palliativeIIMRT. 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 Underwent resection with no macroscopic residual disease. This was followed GOG protocol with intraperitoneal as well as IV chemotherapy with DDPand Taxol. She presented with recurrent disease in September 2013 in the right inguinal nodes biopsy-proven underwent salvage chemotherapy with carboplatin and gemcitabine. Tolerated the treatments well except for neutropenia and thrombocytopenia. Recently has presented with abdominal discomfort as well as urinary frequency urgency and CT scan was performed showing progressive disease in the right inguinal node region as well as a mass in the vaginal apex consistent with progressive recurrent disease. She does have occasional incontinence of urine. She specifically denies any GI symptoms at this time. She's been seen by GYN oncology who is recommended palliative radiation therapy. I have seen her today in consultation and she is doing fairly well. Appetite is good. Having no other areas of pain she does have some soreness in the right inguinal region.  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., 20-Feb-2010   ca fallopian tube:    Oncology Protocol: Pt is on Research Study, GOG 0252, Paclitaxel IV, Cisplatin IP, Paclitaxel IP, and Bevacizumab IV every 3 weeks, Anola Gurney, RN   arrythmia:    ?TIA:    Arthritis:    hypertension:    Bladder Suspension Surgery X 3:    Wrist fracture with pinning:    BSO:    Appendectomy:    left hand surgery and CTR:    Cholecystectomy:    Hysterectomy - Partial:   Past, Family and Social History:  Past Medical History positive   Cardiovascular hypertension; arrhythmia, mitral valve prolapse   Neurological/Psychiatric TIA; peripheral neuropathy septic secondary to chemotherapy   Past Surgical History appendectomy; cholecystectomy; right wrist fracture with pinning,bladder suspension surgery   Past Medical History Comments history of MDRO, thrombocytosis, osteoporosis, arthritis,TAH/BSO   Family History noncontributory   Social History noncontributory   Additional Past Medical and Surgical History accompanied by husband and son today   Allergies:   Macrodantin: Rash  Sulfa drugs: Unknown  Home Meds:  Home Medications: Medication Instructions Status  Macrobid macrocrystals-monohydrate 100 mg oral capsule 1 cap(s) orally once a day Active  gabapentin 300 mg capsule 2 cap(s) orally 3 times a day Active  Zofran 4 mg oral tablet 1 tab(s) orally every 6 hours, As Needed Active  pantoprazole 40 mg oral delayed release tablet 1 tab(s) orally once a day (in the morning) Active  docusate sodium 2 cap(s) orally once a day (at bedtime), As Needed Active  fluticasone 50 mcg/inh nasal spray 2  spray(s) each nostril once a day (at bedtime) Active  Tums Ultra 1000 mg oral tablet, chewable 1 tab(s) orally , As Needed Active   Tylenol Caplet Extra Strength 500 mg oral tablet 2 tab(s) orally once a day, As Needed Active   Review of Systems:  General negative   Performance Status (ECOG) 0   Skin negative   Breast negative   Ophthalmologic negative   ENMT negative   Respiratory and Thorax negative   Cardiovascular negative   Gastrointestinal negative   Genitourinary see HPI   Musculoskeletal negative   Neurological negative   Psychiatric negative   Hematology/Lymphatics negative   Endocrine negative   Allergic/Immunologic negative   Review of Systems   review of systems obtained from nurses notes  Physical Exam:  General/Skin/HEENT:  General normal   Skin normal   Eyes normal   ENMT normal   Head and Neck normal   Additional PE well-developed well-nourished elderly female in NAD. Heart rate is irregular irregular beat. Lungs are clear to A&P. Abdomen is benign. There is some suprapubic tenderness noted. She has some 2/6 right lower extremity edema 1/6 left lower extremity edema. Abdomen is benign with no organomegaly or masses noted.   Breasts/Resp/CV/GI/GU:  Respiratory and Thorax normal   Cardiovascular normal   Gastrointestinal normal   Genitourinary normal   MS/Neuro/Psych/Lymph:  Musculoskeletal normal   Neurological normal   Psychiatric normal   Lymphatics normal   Other Results:  Radiology Results: LabUnknown:    13-Feb-15 12:58, CT Abdomen and Pelvis With Contrast  PACS Image   CT:  CT Abdomen and Pelvis With Contrast   REASON FOR EXAM:    fallopan CA FU  COMMENTS:       PROCEDURE: MCT - MCT ABDOMEN / PELVIS W  - Mar 19 2013 12:58PM     CLINICAL DATA:  Restaging fallopian tube cancer post resection and  chemotherapy, past history bladder suspension, appendectomy,  cholecystectomy, hysterectomy    EXAM:  CT ABDOMEN AND PELVIS WITH CONTRAST    TECHNIQUE:  Multidetector CT imaging of the abdomen and pelvis was performed  using the standard  protocol following bolus administration of  intravenous contrast. Sagittal and coronal MPR images reconstructed  from axial data set.    CONTRAST:  Dilute oral contrast.  100 cc Isovue 300 IV    COMPARISON:  10/07/2012    FINDINGS:  Lung bases clear.    Large hiatal hernia.    Gallbladder uterus and ovaries surgically absent.    Scattered respiratory motion artifacts.  Question focal fatty infiltration of liver image 19 anteriorly.    Remainder of liver, spleen, pancreas, kidneys, and adrenal glands  normal appearance.    Appendix not visualized.    Soft tissue mass posterior to and contiguous with posterior wall of  urinary bladder and the superior margin of the vaginal cuff  compatible with tumor recurrence, measuring 4.6 x 3.5 x 3.8 cm.    This lesion also abuts the anterior wall of the rectum, which  appears minimally thickened.    In addition, enlarged right inguinal lymph nodes are identified, 19  mm short axis image 67 and 24 mm short axis image 61.    Bladder otherwise unremarkable.    Few additional scattered normal sized pelvic lymph nodes are  identified without additional adenopathy or tumor nodularity.    Minimal wall thickening of the sigmoid colon with diffuse sigmoid  diverticulosis.    No definite pericolic inflammatory changes to suggest acute  diverticulitis.    Stomach and small bowel loopsunremarkable.  No free intraperitoneal air or fluid.    Bones demineralized.     IMPRESSION:  Tumor recurrence superior to the vaginal cuff along the posterior  margin of the urinary bladder 4.6 x 3.5 x 3.8 cm.    Mass appears contiguous with the posterior wall of the bladder and  the anterior wall of the rectum with question mild anterior rectal  wall thickening, unable to exclude extension.    Increased right inguinal adenopathy.    Electronically Signed    By: Lavonia Dana M.D.    On: 03/19/2013 13:41         Verified By: Burnetta Sabin, M.D.,    Relevent Results:   Relevant Scans and Labs CT scans are reviewed in a serial fashion.   Assessment and Plan: Impression:   progressive recurrentfallopian tube adenocarcinoma an 81 year old female with bladder and early GI symptoms. Plan:   I discussed the case with Dr. Oliva Bustard personally. Burtis Junes going ahead with palliative IMRT radiation therapy to her mass in the vaginal apex lodged between her bladder and rectum. Would choose IMRT treatment to spare rectum and bladder from further damage. We'll plan on delivering up to 5000 cGy over 5 weeks and evaluate for response. Risks and benefits of treatment including increased urinary frequency and urgency, diarrhea, fatigue, or explained in detail to the patient. I have set her up for CT simulation later this week. I am going to leave alone the right inguinal region at this time since to include both areas would cause significant side effects. Depending on treatment response to her vaginal apex mass may make further with palliative rate recommendations about the right inguinal nodes in the future. Patient and family are comprehend my treatment plan well.  I would like to take this opportunity for allowing me to participate in the care of your patient..   fracture  CC Referral:  cc: Dr. Ezequiel Kayser   Electronic Signatures: Baruch Gouty, Roda Shutters (MD)  (Signed 03-Mar-15 15:32)  Authored: HPI, Diagnosis, Past Hx, PFSH, Allergies, Home Meds, ROS, Physical Exam, Other Results, Relevent Results, Encounter Assessment and Plan, CC Referring Physician   Last Updated: 03-Mar-15 15:32 by Armstead Peaks (MD)

## 2014-05-29 NOTE — Op Note (Signed)
PATIENT NAME:  Nicole Clayton, Nicole Clayton MR#:  [id number] DATE OF BIRTH:  19-Dec-1928  DATE OF PROCEDURE:  06/26/2011  PREOPERATIVE DIAGNOSES: Ventral hernia, ovarian cancer.   POSTOPERATIVE DIAGNOSES: Ventral hernia, ovarian cancer.   PROCEDURES PERFORMED: Ventral hernia repair with mesh, removal of peritoneal catheter with subcutaneous port.   SURGEON: Rochel Brome, MD  ANESTHESIA: General.   INDICATIONS: This 81 year old female has a history of ovarian cancer and has had abdominal exploration some two years ago with a long midline incision. She also had insertion of a peritoneal dialysis catheter with subcutaneous infusion port which was placed in the left upper quadrant and now needing to have that removed. She also had findings of a large ventral hernia and repair was recommended for definitive treatment.   DESCRIPTION OF PROCEDURE: The patient was placed on the operating table in the supine position under general endotracheal anesthesia. The abdomen was prepared with ChloraPrep and draped in a sterile manner.   Initially the peritoneal catheter with port was removed by making an incision in the old scar, in the left upper quadrant. This incision was oriented transversely and approximately 3.5 cm in dimension and carried down through subcutaneous tissues to encounter the catheter which was delivered up out of the abdomen and then the port was sharply excised by dividing the Prolene sutures and removing them and the port. Several small bleeding points were cauterized. Hemostasis was intact. The wound was closed with a running 5-0 Vicryl subcuticular suture.   Next, attention was turned to the ventral hernia repair. A longitudinally oriented incision was made some 5 inches in length above and below the umbilicus and carried down through subcutaneous tissues to encounter a large ventral hernia sac. There was a somewhat tedious dissection dissecting the sac away from surrounding adipose and scar tissue and deep  fascia. The sac was dissected circumferentially away from the fascia and undermined peritoneum up beneath the rectus muscle bilaterally, both inferiorly and superiorly. A portion of the sac was removed and its defect closed with running 4-0 chromic. Several other small defects were closed with 4-0 chromic. It is noted that no nodules or tumors were seen in this immediate area. Next, an Atrium mesh was cut to create a somewhat rectangular shape of approximately 4.5 x 10 cm in dimension. This was placed into the properitoneal plane and sutured to the overlying fascia and muscle with interrupted 0 Surgilon sutures. These sutures were placed approximately 2 cm apart along the periphery of the mesh. Next, the fascia was closed in the midline with a longitudinally oriented row of 0 Surgilon figure-of-eight sutures incorporating each suture into the central aspect of the mesh. The repair looked good. Hemostasis was intact. The skin was closed with interrupted 4-0 nylon vertical mattress sutures. Both wounds were dressed with Betadine paint and then with cotton gauze and paper tape. The patient tolerated surgery satisfactorily and was then prepared for transfer to the recovery room.   This operation was significantly more difficult and took more time than the typical ventral hernia repair due to the large size and extensive dissection. The operation lasted approximately 1 hour, 45 minutes.  ____________________________ Lenna Sciara. Rochel Brome, MD jws:slb D: 06/26/2011 12:48:23 ET T: 06/26/2011 13:22:27 ET JOB#: [id number]  cc: Loreli Dollar, MD, <Dictator> Loreli Dollar MD ELECTRONICALLY SIGNED 07/01/2011 11:14

## 2014-06-02 LAB — CBC CANCER CENTER
Basophil #: 0 x10 3/mm (ref 0.0–0.1)
Basophil %: 1.1 %
EOS PCT: 0.5 %
Eosinophil #: 0 x10 3/mm (ref 0.0–0.7)
HCT: 34 % — ABNORMAL LOW (ref 35.0–47.0)
HGB: 11.2 g/dL — ABNORMAL LOW (ref 12.0–16.0)
LYMPHS ABS: 1.2 x10 3/mm (ref 1.0–3.6)
Lymphocyte %: 26.2 %
MCH: 31.3 pg (ref 26.0–34.0)
MCHC: 32.9 g/dL (ref 32.0–36.0)
MCV: 95 fL (ref 80–100)
MONOS PCT: 14.6 %
Monocyte #: 0.7 x10 3/mm (ref 0.2–0.9)
Neutrophil #: 2.7 x10 3/mm (ref 1.4–6.5)
Neutrophil %: 57.6 %
Platelet: 387 x10 3/mm (ref 150–440)
RBC: 3.57 10*6/uL — AB (ref 3.80–5.20)
RDW: 17.2 % — ABNORMAL HIGH (ref 11.5–14.5)
WBC: 4.6 x10 3/mm (ref 3.6–11.0)

## 2014-06-02 LAB — COMPREHENSIVE METABOLIC PANEL
ALBUMIN: 3.4 g/dL — AB
ALT: 10 U/L — AB
ANION GAP: 5 — AB (ref 7–16)
AST: 23 U/L
Alkaline Phosphatase: 46 U/L
BUN: 17 mg/dL
Bilirubin,Total: 0.5 mg/dL
CALCIUM: 8.6 mg/dL — AB
Chloride: 107 mmol/L
Co2: 26 mmol/L
Creatinine: 1 mg/dL
EGFR (African American): >60
EGFR (Non-African Amer.): 52 — ABNORMAL LOW
Glucose: 106 mg/dL — ABNORMAL HIGH
Potassium: 3.7 mmol/L
Sodium: 138 mmol/L
Total Protein: 6.3 g/dL — ABNORMAL LOW

## 2014-06-03 LAB — CA 125: CA 125: 30.3 U/mL (ref 0.0–34.0)

## 2014-06-05 NOTE — Op Note (Signed)
PATIENT NAMEJamilya, Nicole Clayton MR#:  [id number] DATE OF BIRTH:  [birth year]  DATE OF PROCEDURE:  04/22/2014  PREOPERATIVE DIAGNOSES:  1.  Fallopian tube cancer. 2.  Poorly functioning right jugular Port-A-Cath.   POSTOPERATIVE DIAGNOSES:  1.  Fallopian tube cancer. 2.  Poorly functioning right jugular Port-A-Cath.   PROCEDURE PERFORMED: Injection of Port-A-Cath with contrast for evaluation.   SURGEON: Algernon Huxley, M.D.   ANESTHESIA: None.  ESTIMATED BLOOD LOSS: None.   INDICATION FOR PROCEDURE: An 81 year old female who has a history of problems with DVT and a previous port removal. Her port is not withdrawing well and port injection is performed for further evaluation. Risks and benefits were discussed. Informed consent was obtained.   DESCRIPTION OF PROCEDURE: The patient's area overlying her port was sterilely prepped and draped and a sterile surgical field was created. The port was accessed with a Huber needle. It did not withdraw well but injected well. The catheter tip was found to be just into the right atrium. There was excellent forward flow with no evidence of thrombus or stenosis identified on this injection with the port in good location. At this point, we removed the needle and sterile dressing was placed. The patient tolerated the procedure well.   INTERPRETATION: Normal port injection with forward flow and no problems with the port seen with the port tip just into the right atrium in good location.  ____________________________ Algernon Huxley, MD jsd:sb D: 04/25/2014 10:48:24 ET T: 04/25/2014 11:33:47 ET JOB#: [id number]  cc: Algernon Huxley, MD, <Dictator> Algernon Huxley MD ELECTRONICALLY SIGNED 04/25/2014 15:03

## 2014-07-09 ENCOUNTER — Other Ambulatory Visit: Payer: Self-pay | Admitting: Oncology

## 2014-07-12 ENCOUNTER — Inpatient Hospital Stay: Payer: Medicare Other | Attending: Oncology | Admitting: Oncology

## 2014-07-12 ENCOUNTER — Encounter: Payer: Self-pay | Admitting: Oncology

## 2014-07-12 VITALS — BP 128/83 | HR 72 | Temp 96.2°F | Wt 136.9 lb

## 2014-07-12 DIAGNOSIS — Z86718 Personal history of other venous thrombosis and embolism: Secondary | ICD-10-CM | POA: Diagnosis not present

## 2014-07-12 DIAGNOSIS — Z8543 Personal history of malignant neoplasm of ovary: Secondary | ICD-10-CM | POA: Insufficient documentation

## 2014-07-12 DIAGNOSIS — C569 Malignant neoplasm of unspecified ovary: Secondary | ICD-10-CM | POA: Diagnosis not present

## 2014-07-12 DIAGNOSIS — Z79899 Other long term (current) drug therapy: Secondary | ICD-10-CM | POA: Insufficient documentation

## 2014-07-12 DIAGNOSIS — R309 Painful micturition, unspecified: Secondary | ICD-10-CM | POA: Diagnosis not present

## 2014-07-12 DIAGNOSIS — Z923 Personal history of irradiation: Secondary | ICD-10-CM | POA: Insufficient documentation

## 2014-07-12 DIAGNOSIS — Z7982 Long term (current) use of aspirin: Secondary | ICD-10-CM | POA: Diagnosis not present

## 2014-07-12 DIAGNOSIS — Z9221 Personal history of antineoplastic chemotherapy: Secondary | ICD-10-CM | POA: Diagnosis not present

## 2014-07-12 DIAGNOSIS — R300 Dysuria: Secondary | ICD-10-CM

## 2014-07-12 DIAGNOSIS — C774 Secondary and unspecified malignant neoplasm of inguinal and lower limb lymph nodes: Secondary | ICD-10-CM | POA: Insufficient documentation

## 2014-07-12 DIAGNOSIS — R32 Unspecified urinary incontinence: Secondary | ICD-10-CM | POA: Diagnosis not present

## 2014-07-12 DIAGNOSIS — C5700 Malignant neoplasm of unspecified fallopian tube: Secondary | ICD-10-CM | POA: Diagnosis not present

## 2014-07-12 DIAGNOSIS — K900 Celiac disease: Secondary | ICD-10-CM | POA: Insufficient documentation

## 2014-07-12 DIAGNOSIS — Z7901 Long term (current) use of anticoagulants: Secondary | ICD-10-CM | POA: Insufficient documentation

## 2014-07-12 NOTE — Progress Notes (Signed)
Nicole Clayton @ [facility] [phone]) [phone]  Fax:([phone]) [facility]: January 21, 1929  MR#: [id number]  STM#:[id number]  Patient Care Team: Ezequiel Kayser, [name]  CHIEF COMPLAINT:  Chief Complaint  Patient presents with  . Follow-up    Oncology History   . 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  Adequate TRS, no macroscopic residual. IP/IV chemotherapy with DDP and paclitaxel on GOG protocol, chemo and Bev consolidation completed in 03/2011 2. 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 November 14, 2011- Patient was started on carboplatin and gemcitabine 4. Finished 6 cycles of chemotherapy in April of 2014 with carboplatin and gemcitabine. Tolerance was fairly good except for neutropenia and thrombocytopenia. 5.recurrent disease by CT scan February of 2015 6.radiation therapy to pelvis  May of 2015 7.upper extremity  . and deep vein thrombosis associated with port.(June of 2015) patient started on   [place] had port removed and had   thrombectomy September 06, 2013. 9.progressing disease in the right inguinal area 10.  Patient was started on Taxol and Avastin on a weekly basis 11.  Patient is in chemotherapy holidays because of persistent urinary problem        Carcinoma of fallopian tube   10/05/2009 Initial Diagnosis Carcinoma of fallopian tube    No flowsheet data found.  INTERVAL HISTORY:  81 year old lady who underwent cystoscopy and biopsy of bladder which was negative for any malignancy.  Patient continues to problem of incontinence of urine.  Has been put on multiple medications without much relief.  No chills fever.  Writing well lady swelling is decreased.  Patient also has ovarian cancer which at present time it appears to be responding well to chemotherapy patient is on chemotherapy holiday present time  REVIEW OF SYSTEMS:   GENERAL:   Feels good.  Active.  No fevers, sweats or weight loss. PERFORMANCE STATUS (ECOG):01 HEENT:  No visual changes, runny nose, sore throat, mouth sores or tenderness. Lungs: No shortness of breath or cough.  No hemoptysis. Cardiac:  No chest pain, palpitations, orthopnea, or PND. GI:  No nausea, vomiting, diarrhea, constipation, melena or hematochezia. GU:  Continues to have incontinence of urine.  Intermittent burning.  Recently had cystoscopy which revealed no evidence of malignancy.  Musculoskeletal:  No back pain.  No joint pain.  No muscle tenderness. Extremities:  No pain or swelling. Skin:  No rashes or skin changes. Neuro:  No headache, numbness or weakness, balance or coordination issues. Endocrine:  No diabetes, thyroid issues, hot flashes or night sweats. Psych:  No mood changes, depression or anxiety. Pain:  No focal pain. Review of systems:  All other systems reviewed and found to be negative. As per HPI. Otherwise, a complete review of systems is negatve.  PAST MEDICAL HISTORY: No past medical history on file.  PAST SURGICAL HISTORY: No past surgical history on file.  FAMILY HISTORY No family history on file.  ADVANCED DIRECTIVES:  No flowsheet data found.  HEALTH MAINTENANCE: History  Substance Use Topics  . Smoking status: Never Smoker   . Smokeless tobacco: Not on file  . Alcohol Use: Not on file      Allergies  Allergen Reactions  . Risedronate Sodium Rash    Aching, dysphagia  . Sulfa Antibiotics Rash    Current Outpatient Prescriptions  Medication Sig Dispense Refill  . fluticasone (FLONASE) 50 MCG/ACT nasal spray Place into the  nose.    . gabapentin (NEURONTIN) 600 MG tablet Take 300 mg by mouth.    Marland Kitchen HYDROcodone-acetaminophen (NORCO/VICODIN) 5-325 MG per tablet Take by mouth.    Marland Kitchen lisinopril (PRINIVIL,ZESTRIL) 5 MG tablet     . metoprolol succinate (TOPROL-XL) 50 MG 24 hr tablet Take 50 mg by mouth.    . pantoprazole (PROTONIX) 40 MG tablet Take  40 mg by mouth.    Marland Kitchen aspirin EC 81 MG tablet Take 81 mg by mouth.    . cetirizine (ZYRTEC) 10 MG tablet Take by mouth.    . Cholecalciferol (VITAMIN D3) 2000 UNITS capsule Take by mouth.    . ciprofloxacin (CIPRO) 750 MG tablet     . docusate sodium (COLACE) 100 MG capsule Take 100 mg by mouth.    . ondansetron (ZOFRAN) 4 MG tablet TAKE (1) TABLET BY MOUTH EVERY 8 HOURS AS NEEDED FOR NAUSEA/VOMITING 30 tablet 3  . oxybutynin (DITROPAN) 5 MG tablet Take 5 mg by mouth.     No current facility-administered medications for this visit.    OBJECTIVE:  Filed Vitals:   07/12/14 1133  BP: 128/83  Pulse: 72  Temp: 96.2 F (35.7 C)     There is no height on file to calculate BMI.    ECOG FS:1 - Symptomatic but completely ambulatory  PHYSICAL EXAM: General  status: Performance status is good.  Patient has not lost significant weight HEENT: No evidence of stomatitis. Sclera and conjunctivae :: No jaundice.   pale looking. Lungs: Air  entry equal on both sides.  No rhonchi.  No rales.  Cardiac: Heart sounds are normal.  No pericardial rub.  No murmur. Lymphatic system: Cervical, axillary, inguinal, lymph nodes not palpable GI: Abdomen is soft.  No ascites.  Liver spleen not palpable.  No tenderness.  Bowel sounds are within normal limit Lower extremity: No edema Neurological system: Higher functions, cranial nerves intact no evidence of peripheral neuropathy. Skin: No rash.  No ecchymosis.Marland Kitchen   LAB RESULTS:  No visits with results within 2 Day(s) from this visit. Latest known visit with results is:  Hospital Outpatient Visit on 05/06/2014  Component Date Value Ref Range Status  . CA 125 05/12/2014 30.7  0.0-34.0 U/mL Final   Comment:  [facility] methodology            [facility]            No: [id number]           [address] [place], [place] [address]           Lindon Romp, MD         [phone] Result(s) reported on 13 May 2014 at 12:49PM.   . CA 125  06/02/2014 30.3  0.0-34.0 U/mL Final   Comment:  [facility] methodology            [facility]            No: [id number]           [address], [place],  [address]           Lindon Romp, MD         [phone] Result(s) reported on 03 Jun 2014 at 01:19PM.   . WBC 05/12/2014 4.1  3.6-11.0 x10 3/mm  Final  . RBC 05/12/2014 3.72* 3.80-5.20 x10 6/mm  Final  . HGB 05/12/2014 11.8* 12.0-16.0 g/dL Final  . HCT 05/12/2014 35.4  35.0-47.0 % Final  . MCV 05/12/2014 95  80-100 fL Final  . MCH 05/12/2014 31.6  26.0-34.0 pg Final  . MCHC 05/12/2014 33.2  32.0-36.0 g/dL Final  . RDW 05/12/2014 17.1* 11.5-14.5 % Final  . Platelet 05/12/2014 415  150-440 x10 3/mm  Final  . Neutrophil % 05/12/2014 46.3   Final  . Lymphocyte % 05/12/2014 33.6   Final  . Monocyte % 05/12/2014 17.7   Final  . Eosinophil % 05/12/2014 1.4   Final  . Basophil % 05/12/2014 1.0   Final  . Neutrophil # 05/12/2014 1.9  1.4-6.5 x10 3/mm  Final  . Lymphocyte # 05/12/2014 1.4  1.0-3.6 x10 3/mm  Final  . Monocyte # 05/12/2014 0.7  0.2-0.9 x10 3/mm  Final  . Eosinophil # 05/12/2014 0.1  0.0-0.7 x10 3/mm  Final  . Basophil # 05/12/2014 0.0  0.0-0.1 x10 3/mm  Final   Comment: Labs - This specimen was collected through an   - indwelling catheter or arterial line.  - A minimum of 57ms of blood was wasted prior    - to collecting the sample.  Interpret  - results with caution.   . Glucose, CSF 05/12/2014 DNP   Corrected   Comment: 65-99 NOTE: New Reference Range  04/12/14   . BUN 05/12/2014 16   Final   Comment: 6-20 NOTE: New Reference Range  04/12/14   . Creatinine 05/12/2014 0.87   Final   Comment: 0.44-1.00 NOTE: New Reference Range  04/12/14   . Sodium, Urine Random 05/12/2014 DNP   Corrected   Comment: 135-145 NOTE: New Reference Range  04/12/14   . Potassium, Urine Random 05/12/2014 DNP   Corrected   Comment: 3.5-5.1 NOTE: New Reference Range  04/12/14   . Chloride, Urine  Random 05/12/2014 DNP   Corrected   Comment: 101-111 NOTE: New Reference Range  04/12/14   . Co2 05/12/2014 25   Final   Comment: 22-32 NOTE: New Reference Range  04/12/14   . Calcium, Total 05/12/2014 8.7*  Final   Comment: 8.9-10.3 NOTE: New Reference Range  04/12/14   . SGOT(AST) 05/12/2014 24   Final   Comment: 15-41 NOTE: New Reference Range  04/12/14   . SGPT (ALT) 05/12/2014 13*  Final   Comment: 14-54 NOTE: New Reference Range  04/12/14   . Alkaline Phosphatase 05/12/2014 49   Final   Comment: 38-126 NOTE: New Reference Range  04/12/14   . Albumin 05/12/2014 3.5   Final   Comment: 3.5-5.0 NOTE: New reference range  04/12/14   . Total Protein 05/12/2014 6.5   Final   Comment: 6.5-8.1 NOTE: New Reference Range  04/12/14   . Bilirubin,Total 05/12/2014 0.5   Final   Comment: 0.3-1.2 NOTE: New Reference Range  04/12/14   . Anion Gap 05/12/2014 6* 7-16 Final  . EGFR (African American) 05/12/2014 >60   Final  . EGFR (Non-African Amer.) 05/12/2014 >60   Final   Comment: eGFR values <675mmin/1.73 m2 may be an indication of chronic kidney disease (CKD). Calculated eGFR is useful in patients with stable renal function. The eGFR calculation will not be reliable in acutely ill patients when serum creatinine is changing rapidly. It is not useful in patients on dialysis. The eGFR calculation may not be applicable to patients at the low and high extremes of body sizes, pregnant women, and vegetarians.   . Glucose 05/12/2014 116*  Final   Comment: 65-99 NOTE: New Reference Range  04/12/14   . Sodium 05/12/2014 137   Final   Comment: 135-145 NOTE:  New Reference Range  04/12/14   . Potassium 05/12/2014 3.4*  Final   Comment: 3.5-5.1 NOTE: New Reference Range  04/12/14   . Chloride 05/12/2014 106   Final   Comment: 101-111 NOTE: New Reference Range  04/12/14   . WBC 06/02/2014 4.6  3.6-11.0 x10 3/mm  Final  . RBC 06/02/2014 3.57* 3.80-5.20 x10 6/mm   Final  . HGB 06/02/2014 11.2* 12.0-16.0 g/dL Final  . HCT 06/02/2014 34.0* 35.0-47.0 % Final  . MCV 06/02/2014 95  80-100 fL Final  . MCH 06/02/2014 31.3  26.0-34.0 pg Final  . MCHC 06/02/2014 32.9  32.0-36.0 g/dL Final  . RDW 06/02/2014 17.2* 11.5-14.5 % Final  . Platelet 06/02/2014 387  150-440 x10 3/mm  Final  . Neutrophil % 06/02/2014 57.6   Final  . Lymphocyte % 06/02/2014 26.2   Final  . Monocyte % 06/02/2014 14.6   Final  . Eosinophil % 06/02/2014 0.5   Final  . Basophil % 06/02/2014 1.1   Final  . Neutrophil # 06/02/2014 2.7  1.4-6.5 x10 3/mm  Final  . Lymphocyte # 06/02/2014 1.2  1.0-3.6 x10 3/mm  Final  . Monocyte # 06/02/2014 0.7  0.2-0.9 x10 3/mm  Final  . Eosinophil # 06/02/2014 0.0  0.0-0.7 x10 3/mm  Final  . Basophil # 06/02/2014 0.0  0.0-0.1 x10 3/mm  Final   Comment: Labs - This specimen was collected through an   - indwelling catheter or arterial line.  - A minimum of 106ms of blood was wasted prior    - to collecting the sample.  Interpret  - results with caution.   . Glucose, CSF 06/02/2014 DNP   Corrected   Comment: 65-99 NOTE: New Reference Range  04/12/14   . BUN 06/02/2014 17   Final   Comment: 6-20 NOTE: New Reference Range  04/12/14   . Creatinine 06/02/2014 1.00   Final   Comment: 0.44-1.00 NOTE: New Reference Range  04/12/14   . Sodium, Urine Random 06/02/2014 DNP   Corrected   Comment: 135-145 NOTE: New Reference Range  04/12/14   . Potassium, Urine Random 06/02/2014 DNP   Corrected   Comment: 3.5-5.1 NOTE: New Reference Range  04/12/14   . Chloride, Urine Random 06/02/2014 DNP   Corrected   Comment: 101-111 NOTE: New Reference Range  04/12/14   . Co2 06/02/2014 26   Final   Comment: 22-32 NOTE: New Reference Range  04/12/14   . Calcium, Total 06/02/2014 8.6*  Final   Comment: 8.9-10.3 NOTE: New Reference Range  04/12/14   . SGOT(AST) 06/02/2014 23   Final   Comment: 15-41 NOTE: New Reference Range  04/12/14   . SGPT  (ALT) 06/02/2014 10*  Final   Comment: 14-54 NOTE: New Reference Range  04/12/14   . Alkaline Phosphatase 06/02/2014 46   Final   Comment: 38-126 NOTE: New Reference Range  04/12/14   . Albumin 06/02/2014 3.4*  Final   Comment: 3.5-5.0 NOTE: New reference range  04/12/14   . Total Protein 06/02/2014 6.3*  Final   Comment: 6.5-8.1 NOTE: New Reference Range  04/12/14   . Bilirubin,Total 06/02/2014 0.5   Final   Comment: 0.3-1.2 NOTE: New Reference Range  04/12/14   . Anion Gap 06/02/2014 5* 7-16 Final  . EGFR (African American) 06/02/2014 >60   Final  . EGFR (Non-African Amer.) 06/02/2014 52*  Final   Comment: eGFR values <656mmin/1.73 m2 may be an indication of chronic kidney disease (CKD). Calculated eGFR is useful in  patients with stable renal function. The eGFR calculation will not be reliable in acutely ill patients when serum creatinine is changing rapidly. It is not useful in patients on dialysis. The eGFR calculation may not be applicable to patients at the low and high extremes of body sizes, pregnant women, and vegetarians.   . Glucose 06/02/2014 106*  Final   Comment: 65-99 NOTE: New Reference Range  04/12/14   . Sodium 06/02/2014 138   Final   Comment: 135-145 NOTE: New Reference Range  04/12/14   . Potassium 06/02/2014 3.7   Final   Comment: 3.5-5.1 NOTE: New Reference Range  04/12/14   . Chloride 06/02/2014 107   Final   Comment: 101-111 NOTE: New Reference Range  04/12/14   . WBC 05/19/2014 5.7  3.6-11.0 x10 3/mm  Final  . RBC 05/19/2014 3.63* 3.80-5.20 x10 6/mm  Final  . HGB 05/19/2014 11.2* 12.0-16.0 g/dL Final  . HCT 05/19/2014 34.5* 35.0-47.0 % Final  . MCV 05/19/2014 95  80-100 fL Final  . MCH 05/19/2014 30.8  26.0-34.0 pg Final  . MCHC 05/19/2014 32.4  32.0-36.0 g/dL Final  . RDW 05/19/2014 17.1* 11.5-14.5 % Final  . Platelet 05/19/2014 381  150-440 x10 3/mm  Final  . Neutrophil % 05/19/2014 61.5   Final  . Lymphocyte % 05/19/2014  26.9   Final  . Monocyte % 05/19/2014 10.1   Final  . Eosinophil % 05/19/2014 0.9   Final  . Basophil % 05/19/2014 0.6   Final  . Neutrophil # 05/19/2014 3.5  1.4-6.5 x10 3/mm  Final  . Lymphocyte # 05/19/2014 1.5  1.0-3.6 x10 3/mm  Final  . Monocyte # 05/19/2014 0.6  0.2-0.9 x10 3/mm  Final  . Eosinophil # 05/19/2014 0.1  0.0-0.7 x10 3/mm  Final  . Basophil # 05/19/2014 0.0  0.0-0.1 x10 3/mm  Final   Comment: Labs - This specimen was collected through an   - indwelling catheter or arterial line.  - A minimum of 63ms of blood was wasted prior    - to collecting the sample.  Interpret  - results with caution.        ASSESSMENT: 81year old lady with recurrent ovarian cancer Continuing bladder problems and had a recent cystoscopy negative for malignancy   MEDICAL DECISION MAKING:  All lab data has been reviewed. We will hold off any further chemotherapy will follow patient with tumor markers and clinical examination if there is any recurrent or progressive disease further chemotherapy will be started Incontinence and burning of urine patient is being followed by urologist Total duration of visit was 45 minutes.  50% or more time was spent in counseling patient and family regarding prognosis and options of treatment and available resources  Patient expressed understanding and was in agreement with this plan. She also understands that She can call clinic at any time with any questions, concerns, or complaints.    No matching staging information was found for the patient.  JForest Gleason MD   07/12/2014 9:17 PM

## 2014-07-12 NOTE — Progress Notes (Signed)
Patient never smoked.  Does have living will. 

## 2014-08-09 ENCOUNTER — Inpatient Hospital Stay: Payer: PRIVATE HEALTH INSURANCE | Attending: Oncology

## 2014-08-09 ENCOUNTER — Inpatient Hospital Stay: Payer: PRIVATE HEALTH INSURANCE | Admitting: Oncology

## 2014-08-12 DIAGNOSIS — N390 Urinary tract infection, site not specified: Secondary | ICD-10-CM | POA: Insufficient documentation

## 2014-08-18 ENCOUNTER — Other Ambulatory Visit: Payer: PRIVATE HEALTH INSURANCE

## 2014-08-18 ENCOUNTER — Inpatient Hospital Stay: Payer: Medicare Other

## 2014-08-18 ENCOUNTER — Ambulatory Visit: Payer: PRIVATE HEALTH INSURANCE

## 2014-08-18 ENCOUNTER — Inpatient Hospital Stay: Payer: Medicare Other | Attending: Oncology | Admitting: Oncology

## 2014-08-18 VITALS — BP 122/85 | HR 72 | Temp 96.3°F | Wt 136.0 lb

## 2014-08-18 DIAGNOSIS — Z79899 Other long term (current) drug therapy: Secondary | ICD-10-CM | POA: Diagnosis not present

## 2014-08-18 DIAGNOSIS — Z923 Personal history of irradiation: Secondary | ICD-10-CM | POA: Insufficient documentation

## 2014-08-18 DIAGNOSIS — C569 Malignant neoplasm of unspecified ovary: Secondary | ICD-10-CM | POA: Insufficient documentation

## 2014-08-18 DIAGNOSIS — Z86718 Personal history of other venous thrombosis and embolism: Secondary | ICD-10-CM | POA: Insufficient documentation

## 2014-08-18 DIAGNOSIS — K900 Celiac disease: Secondary | ICD-10-CM | POA: Insufficient documentation

## 2014-08-18 DIAGNOSIS — Z7982 Long term (current) use of aspirin: Secondary | ICD-10-CM | POA: Diagnosis not present

## 2014-08-18 DIAGNOSIS — Z8543 Personal history of malignant neoplasm of ovary: Secondary | ICD-10-CM | POA: Diagnosis not present

## 2014-08-18 DIAGNOSIS — I10 Essential (primary) hypertension: Secondary | ICD-10-CM | POA: Insufficient documentation

## 2014-08-18 DIAGNOSIS — Z9221 Personal history of antineoplastic chemotherapy: Secondary | ICD-10-CM | POA: Insufficient documentation

## 2014-08-18 DIAGNOSIS — C774 Secondary and unspecified malignant neoplasm of inguinal and lower limb lymph nodes: Secondary | ICD-10-CM | POA: Diagnosis not present

## 2014-08-18 DIAGNOSIS — R32 Unspecified urinary incontinence: Secondary | ICD-10-CM | POA: Diagnosis not present

## 2014-08-18 DIAGNOSIS — M1990 Unspecified osteoarthritis, unspecified site: Secondary | ICD-10-CM | POA: Diagnosis not present

## 2014-08-18 DIAGNOSIS — Z8673 Personal history of transient ischemic attack (TIA), and cerebral infarction without residual deficits: Secondary | ICD-10-CM | POA: Diagnosis not present

## 2014-08-18 DIAGNOSIS — C5700 Malignant neoplasm of unspecified fallopian tube: Secondary | ICD-10-CM

## 2014-08-18 LAB — COMPREHENSIVE METABOLIC PANEL
ALT: 14 U/L (ref 14–54)
AST: 28 U/L (ref 15–41)
Albumin: 3.7 g/dL (ref 3.5–5.0)
Alkaline Phosphatase: 54 U/L (ref 38–126)
Anion gap: 6 (ref 5–15)
BILIRUBIN TOTAL: 0.4 mg/dL (ref 0.3–1.2)
BUN: 17 mg/dL (ref 6–20)
CHLORIDE: 105 mmol/L (ref 101–111)
CO2: 27 mmol/L (ref 22–32)
Calcium: 8.5 mg/dL — ABNORMAL LOW (ref 8.9–10.3)
Creatinine, Ser: 0.97 mg/dL (ref 0.44–1.00)
GFR, EST NON AFRICAN AMERICAN: 52 mL/min — AB (ref >60)
Glucose, Bld: 99 mg/dL (ref 65–99)
Potassium: 3.7 mmol/L (ref 3.5–5.1)
SODIUM: 138 mmol/L (ref 135–145)
Total Protein: 7.1 g/dL (ref 6.5–8.1)

## 2014-08-18 LAB — CBC WITH DIFFERENTIAL/PLATELET
Basophils Absolute: 0.1 10*3/uL (ref 0–0.1)
Basophils Relative: 1 %
Eosinophils Absolute: 0 10*3/uL (ref 0–0.7)
Eosinophils Relative: 0 %
HCT: 36.5 % (ref 35.0–47.0)
HEMOGLOBIN: 12 g/dL (ref 12.0–16.0)
LYMPHS ABS: 1.9 10*3/uL (ref 1.0–3.6)
LYMPHS PCT: 24 %
MCH: 29.7 pg (ref 26.0–34.0)
MCHC: 32.8 g/dL (ref 32.0–36.0)
MCV: 90.6 fL (ref 80.0–100.0)
Monocytes Absolute: 0.7 10*3/uL (ref 0.2–0.9)
Monocytes Relative: 9 %
Neutro Abs: 5.1 10*3/uL (ref 1.4–6.5)
Neutrophils Relative %: 66 %
PLATELETS: 380 10*3/uL (ref 150–440)
RBC: 4.03 MIL/uL (ref 3.80–5.20)
RDW: 15.3 % — ABNORMAL HIGH (ref 11.5–14.5)
WBC: 7.7 10*3/uL (ref 3.6–11.0)

## 2014-08-18 MED ORDER — HEPARIN SOD (PORK) LOCK FLUSH 100 UNIT/ML IV SOLN
500.0000 [IU] | Freq: Once | INTRAVENOUS | Status: AC
  Administered 2014-08-18: 500 [IU] via INTRAVENOUS

## 2014-08-18 MED ORDER — PHENAZOPYRIDINE HCL 100 MG PO TABS: 100.0000 mg | ORAL_TABLET | Freq: Three times a day (TID) | ORAL | Status: DC

## 2014-08-18 MED ORDER — HEPARIN SOD (PORK) LOCK FLUSH 100 UNIT/ML IV SOLN
INTRAVENOUS | Status: AC
  Filled 2014-08-18: qty 5

## 2014-08-18 MED ORDER — SODIUM CHLORIDE 0.9 % IJ SOLN
10.0000 mL | Freq: Once | INTRAMUSCULAR | Status: AC
  Administered 2014-08-18: 10 mL via INTRAVENOUS
  Filled 2014-08-18: qty 10

## 2014-08-18 NOTE — Progress Notes (Signed)
Patient does have living will .Never smoked. Requesting refill for Zofran.

## 2014-08-19 LAB — CA 125: CA 125: 28.1 U/mL (ref 0.0–38.1)

## 2014-08-28 ENCOUNTER — Encounter: Payer: Self-pay | Admitting: Oncology

## 2014-08-28 NOTE — Progress Notes (Signed)
[place] @ [facility] Telephone:(336) [phone]  Fax:(336) [place] OB: Jun 16, 1928  MR#: [id number]  BUL#:[id number]  Patient Care Team: Ezequiel Kayser, [name]  CHIEF COMPLAINT:  Chief Complaint  Patient presents with  . Follow-up    Oncology History   . 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  Adequate TRS, no macroscopic residual. IP/IV chemotherapy with DDP and paclitaxel on GOG protocol, chemo and Bev consolidation completed in 03/2011 2. 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 November 14, 2011- Patient was started on carboplatin and gemcitabine 4. Finished 6 cycles of chemotherapy in April of 2014 with carboplatin and gemcitabine. Tolerance was fairly good except for neutropenia and thrombocytopenia. 5.recurrent disease by CT scan February of 2015 6.radiation therapy to pelvis  May of 2015 7.upper extremity  . and deep vein thrombosis associated with port.(June of 2015) patient started on   [place] had port removed and had   thrombectomy September 06, 2013. 9.progressing disease in the right inguinal area 10.  Patient was started on Taxol and Avastin on a weekly basis 11.  Patient is in chemotherapy holidays because of persistent urinary problem        Carcinoma of fallopian tube   10/05/2009 Initial Diagnosis Carcinoma of fallopian tube    Recurrent carcinoma of fallopian tube.  Presently on observation INTERVAL HISTORY:  81 year old lady who underwent cystoscopy and biopsy of bladder which was negative for any malignancy.  Patient continues to problem of incontinence of urine.  Has been put on multiple medications without much relief.  No chills fever.  Writing well lady swelling is decreased.  Patient also has ovarian cancer which at present time it appears to be responding well to chemotherapy patient is on chemotherapy holiday present  time Patient is off chemotherapy.  Patient continues to have problems with urinary incontinence.  Had a visit to urologist recently cultures were negative. REVIEW OF SYSTEMS:   GENERAL:  Feels good.  Active.  No fevers, sweats or weight loss. PERFORMANCE STATUS (ECOG):01 HEENT:  No visual changes, runny nose, sore throat, mouth sores or tenderness. Lungs: No shortness of breath or cough.  No hemoptysis. Cardiac:  No chest pain, palpitations, orthopnea, or PND. GI:  No nausea, vomiting, diarrhea, constipation, melena or hematochezia. GU:  Continues to have incontinence of urine.  Intermittent burning.  Recently had cystoscopy which revealed no evidence of malignancy.  Musculoskeletal:  No back pain.  No joint pain.  No muscle tenderness. Extremities:  No pain or swelling. Skin:  No rashes or skin changes. Neuro:  No headache, numbness or weakness, balance or coordination issues. Endocrine:  No diabetes, thyroid issues, hot flashes or night sweats. Psych:  No mood changes, depression or anxiety. Pain:  No focal pain. Review of systems:  All other systems reviewed and found to be negative. As per HPI. Otherwise, a complete review of systems is negatve.  Preventive Screening:  Has patient had any of the following test? Mammography   Last Mammography: 2013   Smoking History: Smoking History Never Smoked.  PFSH: Family History: noncontributory  Comments: no maligancies  Social History: negative alcohol, negative tobacco  Additional Past Medical and Surgical History: Med:   HTN for 9 years             celiac disease, not active,  seasonal allergies              arthritis of the small joints,  TIA 20 years ago no further symptoms.    Mitral wall prolapse without any significant cardiac arrhythmias or congestive heart failure.    Surg:     1963  hysterectomy for descensus               1996  cholecystectomy through the scope,               2000  sling procedure for  incontinence (Dr. Kathyrn Lass)   ADVANCED DIRECTIVES:  No flowsheet data found.  HEALTH MAINTENANCE: History  Substance Use Topics  . Smoking status: Never Smoker   . Smokeless tobacco: Not on file  . Alcohol Use: Not on file      Allergies  Allergen Reactions  . Risedronate Sodium Rash    Aching, dysphagia  . Nitrofurantoin Other (See Comments)  . Sulfa Antibiotics Rash    Current Outpatient Prescriptions  Medication Sig Dispense Refill  . aspirin EC 81 MG tablet Take 81 mg by mouth.    . cetirizine (ZYRTEC) 10 MG tablet Take by mouth.    . Cholecalciferol (VITAMIN D3) 2000 UNITS capsule Take by mouth.    . ciprofloxacin (CIPRO) 750 MG tablet     . docusate sodium (COLACE) 100 MG capsule Take 100 mg by mouth.    . fluticasone (FLONASE) 50 MCG/ACT nasal spray Place into the nose.    . gabapentin (NEURONTIN) 600 MG tablet Take 300 mg by mouth.    Marland Kitchen HYDROcodone-acetaminophen (NORCO/VICODIN) 5-325 MG per tablet Take by mouth.    Marland Kitchen lisinopril (PRINIVIL,ZESTRIL) 5 MG tablet     . metoprolol succinate (TOPROL-XL) 50 MG 24 hr tablet Take 50 mg by mouth.    . ondansetron (ZOFRAN) 4 MG tablet TAKE (1) TABLET BY MOUTH EVERY 8 HOURS AS NEEDED FOR NAUSEA/VOMITING 30 tablet 3  . oxybutynin (DITROPAN) 5 MG tablet Take 5 mg by mouth.    . pantoprazole (PROTONIX) 40 MG tablet Take 40 mg by mouth.    . phenazopyridine (PYRIDIUM) 100 MG tablet Take 1 tablet (100 mg total) by mouth 3 (three) times daily with meals. 45 tablet 0   No current facility-administered medications for this visit.    OBJECTIVE:  Filed Vitals:   08/18/14 1054  BP: 122/85  Pulse: 72  Temp: 96.3 F (35.7 C)     There is no height on file to calculate BMI.    ECOG FS:1 - Symptomatic but completely ambulatory  PHYSICAL EXAM: General  status: Performance status is good.  Patient has not lost significant weight HEENT: No evidence of stomatitis. Sclera and conjunctivae :: No jaundice.   pale looking. Lungs: Air   entry equal on both sides.  No rhonchi.  No rales.  Cardiac: Heart sounds are normal.  No pericardial rub.  No murmur. Lymphatic system: Cervical, axillary, inguinal, lymph nodes not palpable GI: Abdomen is soft.  No ascites.  Liver spleen not palpable.  No tenderness.  Bowel sounds are within normal limit Lower extremity: No edema Neurological system: Higher functions, cranial nerves intact no evidence of peripheral neuropathy. Skin: No rash.  No ecchymosis.Marland Kitchen\   LAB RESULTS:  Infusion on 08/18/2014  Component Date Value Ref Range Status  . WBC 08/18/2014 7.7  3.6 - 11.0 K/uL Final   A-LINE DRAW  . RBC 08/18/2014 4.03  3.80 - 5.20 MIL/uL Final  . Hemoglobin 08/18/2014 12.0  12.0 -  16.0 g/dL Final  . HCT 08/18/2014 36.5  35.0 - 47.0 % Final  . MCV 08/18/2014 90.6  80.0 - 100.0 fL Final  . MCH 08/18/2014 29.7  26.0 - 34.0 pg Final  . MCHC 08/18/2014 32.8  32.0 - 36.0 g/dL Final  . RDW 08/18/2014 15.3* 11.5 - 14.5 % Final  . Platelets 08/18/2014 380  150 - 440 K/uL Final  . Neutrophils Relative % 08/18/2014 66   Final  . Neutro Abs 08/18/2014 5.1  1.4 - 6.5 K/uL Final  . Lymphocytes Relative 08/18/2014 24   Final  . Lymphs Abs 08/18/2014 1.9  1.0 - 3.6 K/uL Final  . Monocytes Relative 08/18/2014 9   Final  . Monocytes Absolute 08/18/2014 0.7  0.2 - 0.9 K/uL Final  . Eosinophils Relative 08/18/2014 0   Final  . Eosinophils Absolute 08/18/2014 0.0  0 - 0.7 K/uL Final  . Basophils Relative 08/18/2014 1   Final  . Basophils Absolute 08/18/2014 0.1  0 - 0.1 K/uL Final  . Sodium 08/18/2014 138  135 - 145 mmol/L Final  . Potassium 08/18/2014 3.7  3.5 - 5.1 mmol/L Final  . Chloride 08/18/2014 105  101 - 111 mmol/L Final  . CO2 08/18/2014 27  22 - 32 mmol/L Final  . Glucose, Bld 08/18/2014 99  65 - 99 mg/dL Final  . BUN 08/18/2014 17  6 - 20 mg/dL Final  . Creatinine, Ser 08/18/2014 0.97  0.44 - 1.00 mg/dL Final  . Calcium 08/18/2014 8.5* 8.9 - 10.3 mg/dL Final  . Total Protein 08/18/2014  7.1  6.5 - 8.1 g/dL Final  . Albumin 08/18/2014 3.7  3.5 - 5.0 g/dL Final  . AST 08/18/2014 28  15 - 41 U/L Final  . ALT 08/18/2014 14  14 - 54 U/L Final  . Alkaline Phosphatase 08/18/2014 54  38 - 126 U/L Final  . Total Bilirubin 08/18/2014 0.4  0.3 - 1.2 mg/dL Final  . GFR calc non Af Amer 08/18/2014 52* >60 mL/min Final  . GFR calc Af Amer 08/18/2014 >60  >60 mL/min Final   Comment: (NOTE) The eGFR has been calculated using the CKD EPI equation. This calculation has not been validated in all clinical situations. eGFR's persistently <60 mL/min signify possible Chronic Kidney Disease.   . Anion gap 08/18/2014 6  5 - 15 Final  . CA 125 08/18/2014 28.1  0.0 - 38.1 U/mL Final   Comment: (NOTE) Roche ECLIA methodology Performed At: [facility] [place], [place] [id number] Lindon Romp MD AC:[id number]        ASSESSMENT: 81 year old lady with recurrent ovarian cancer Continuing bladder problems and had a recent cystoscopy negative for malignancy Patient continues to have burning and dysuria   MEDICAL DECISION MAKING:  All lab data has been reviewed. Tumor markers are stable no further chemotherapy recommended.  Patient was started on nitrofurantoin to see whether that would help with patient's complaint dysuria Even though patient has been mentioned rash with Macrodantin in the past nitrofurantoin was given by this practice without any significant side effect Patient was instructed to call me if she develop rash  Patient expressed understanding and was in agreement with this plan. She also understands that She can call clinic at any time with any questions, concerns, or complaints.    No matching staging information was found for the patient.  Forest Gleason, MD   08/28/2014 12:14 PM

## 2014-09-29 ENCOUNTER — Other Ambulatory Visit: Payer: PRIVATE HEALTH INSURANCE

## 2014-09-29 ENCOUNTER — Ambulatory Visit: Payer: PRIVATE HEALTH INSURANCE | Admitting: Oncology

## 2014-10-13 ENCOUNTER — Inpatient Hospital Stay: Payer: Medicare Other

## 2014-10-13 ENCOUNTER — Encounter: Payer: Self-pay | Admitting: Oncology

## 2014-10-13 ENCOUNTER — Inpatient Hospital Stay: Payer: Medicare Other | Attending: Oncology

## 2014-10-13 ENCOUNTER — Inpatient Hospital Stay (HOSPITAL_BASED_OUTPATIENT_CLINIC_OR_DEPARTMENT_OTHER): Payer: Medicare Other | Admitting: Oncology

## 2014-10-13 VITALS — BP 121/74 | HR 64 | Temp 96.0°F | Wt 139.1 lb

## 2014-10-13 DIAGNOSIS — R32 Unspecified urinary incontinence: Secondary | ICD-10-CM | POA: Diagnosis not present

## 2014-10-13 DIAGNOSIS — Z79899 Other long term (current) drug therapy: Secondary | ICD-10-CM | POA: Insufficient documentation

## 2014-10-13 DIAGNOSIS — Z86718 Personal history of other venous thrombosis and embolism: Secondary | ICD-10-CM | POA: Insufficient documentation

## 2014-10-13 DIAGNOSIS — Z7982 Long term (current) use of aspirin: Secondary | ICD-10-CM

## 2014-10-13 DIAGNOSIS — C5700 Malignant neoplasm of unspecified fallopian tube: Secondary | ICD-10-CM | POA: Diagnosis not present

## 2014-10-13 DIAGNOSIS — Z9221 Personal history of antineoplastic chemotherapy: Secondary | ICD-10-CM | POA: Diagnosis not present

## 2014-10-13 DIAGNOSIS — C774 Secondary and unspecified malignant neoplasm of inguinal and lower limb lymph nodes: Secondary | ICD-10-CM

## 2014-10-13 DIAGNOSIS — Z8543 Personal history of malignant neoplasm of ovary: Secondary | ICD-10-CM | POA: Diagnosis not present

## 2014-10-13 DIAGNOSIS — Z923 Personal history of irradiation: Secondary | ICD-10-CM | POA: Insufficient documentation

## 2014-10-13 LAB — COMPREHENSIVE METABOLIC PANEL
ALBUMIN: 3.4 g/dL — AB (ref 3.5–5.0)
ALT: 12 U/L — ABNORMAL LOW (ref 14–54)
AST: 22 U/L (ref 15–41)
Alkaline Phosphatase: 63 U/L (ref 38–126)
Anion gap: 2 — ABNORMAL LOW (ref 5–15)
BUN: 24 mg/dL — ABNORMAL HIGH (ref 6–20)
CO2: 26 mmol/L (ref 22–32)
Calcium: 8.1 mg/dL — ABNORMAL LOW (ref 8.9–10.3)
Chloride: 108 mmol/L (ref 101–111)
Creatinine, Ser: 1.28 mg/dL — ABNORMAL HIGH (ref 0.44–1.00)
GFR calc Af Amer: 43 mL/min — ABNORMAL LOW (ref >60)
GFR calc non Af Amer: 37 mL/min — ABNORMAL LOW (ref >60)
GLUCOSE: 123 mg/dL — AB (ref 65–99)
POTASSIUM: 3.9 mmol/L (ref 3.5–5.1)
SODIUM: 136 mmol/L (ref 135–145)
Total Bilirubin: 0.5 mg/dL (ref 0.3–1.2)
Total Protein: 6.5 g/dL (ref 6.5–8.1)

## 2014-10-13 LAB — CBC WITH DIFFERENTIAL/PLATELET
Basophils Absolute: 0 10*3/uL (ref 0–0.1)
Basophils Relative: 0 %
EOS ABS: 0 10*3/uL (ref 0–0.7)
Eosinophils Relative: 1 %
HCT: 29.4 % — ABNORMAL LOW (ref 35.0–47.0)
HEMOGLOBIN: 9.7 g/dL — AB (ref 12.0–16.0)
Lymphocytes Relative: 29 %
Lymphs Abs: 1.5 10*3/uL (ref 1.0–3.6)
MCH: 29.6 pg (ref 26.0–34.0)
MCHC: 33.1 g/dL (ref 32.0–36.0)
MCV: 89.3 fL (ref 80.0–100.0)
MONO ABS: 0.7 10*3/uL (ref 0.2–0.9)
MONOS PCT: 14 %
NEUTROS PCT: 56 %
Neutro Abs: 2.9 10*3/uL (ref 1.4–6.5)
Platelets: 367 10*3/uL (ref 150–440)
RBC: 3.29 MIL/uL — ABNORMAL LOW (ref 3.80–5.20)
RDW: 15.7 % — AB (ref 11.5–14.5)
WBC: 5.2 10*3/uL (ref 3.6–11.0)

## 2014-10-13 MED ORDER — DOXYCYCLINE HYCLATE 100 MG PO TABS: 100.0000 mg | ORAL_TABLET | Freq: Two times a day (BID) | ORAL | Status: DC

## 2014-10-13 MED ORDER — HEPARIN SOD (PORK) LOCK FLUSH 100 UNIT/ML IV SOLN
500.0000 [IU] | Freq: Once | INTRAVENOUS | Status: AC
  Administered 2014-10-13: 500 [IU] via INTRAVENOUS
  Filled 2014-10-13: qty 5

## 2014-10-13 MED ORDER — SODIUM CHLORIDE 0.9 % IJ SOLN
10.0000 mL | Freq: Once | INTRAMUSCULAR | Status: AC
  Administered 2014-10-13: 10 mL via INTRAVENOUS
  Filled 2014-10-13: qty 10

## 2014-10-13 NOTE — Progress Notes (Signed)
Patient does have living will.  Never smoked.  Patient fell on Friday night and bruised up her right leg.  Did not seek medical attention.  Leg and foot swollen.  Requesting refill for Doxycycline.

## 2014-10-13 NOTE — Progress Notes (Signed)
[place] @ [facility] Telephone:(336) [phone]  Fax:(336) [place] OB: Jul 24, 1928  MR#: [id number]  ATF#:[id number]  Patient Care Team: Ezequiel Kayser, [name]  CHIEF COMPLAINT:  Chief Complaint  Patient presents with  . Other    Right leg pain/bladder infection   Oncology History   . 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  Adequate TRS, no macroscopic residual. IP/IV chemotherapy with DDP and paclitaxel on GOG protocol, chemo and Bev consolidation completed in 03/2011 2. 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 November 14, 2011- Patient was started on carboplatin and gemcitabine 4. Finished 6 cycles of chemotherapy in April of 2014 with carboplatin and gemcitabine. Tolerance was fairly good except for neutropenia and thrombocytopenia. 5.recurrent disease by CT scan February of 2015 6.radiation therapy to pelvis  May of 2015 7.upper extremity  . and deep vein thrombosis associated with port.(June of 2015) patient started on   [place] had port removed and had   thrombectomy September 06, 2013. 9.progressing disease in the right inguinal area 10.  Patient was started on Taxol and Avastin on a weekly basis 11.  Patient is   On chemotherapy holidays because of persistent urinary problem   12Recurrent carcinoma of fallopian tube.  Presently on observation        INTERVAL HISTORY:  81 year old lady who underwent cystoscopy and biopsy of bladder which was negative for any malignancy.  Patient continues to problem of incontinence of urine.  Has been put on multiple medications without much relief.  No chills fever.  Writing well lady swelling is decreased.  Patient also has ovarian cancer which at present time it appears to be responding well to chemotherapy patient is on chemotherapy holiday present time Patient is off chemotherapy.  Patient continues to  have problems with urinary incontinence.  Had a visit to urologist recently cultures were negative. September, 2016 Patient came back for further follow-up regarding ovarian cancer.  Patient fell and developed massive hematoma in the lright lower extremity. According to the patient and family swelling in the leg is getting better.  They did not go to emergency room or did not seek any medical advice.  There was a drop in hemoglobin by 2 g because of bleeding.  Continues to have urinary symptoms.  No abdominal pain no nausea no vomiting. No swelling in the right inguinal area  REVIEW OF SYSTEMS:   GENERAL:  Feels good.  Active.  No fevers, sweats or weight loss. Recent had a recent fall  PERFORMANCE STATUS (ECOG):01 HEENT:  No visual changes, runny nose, sore throat, mouth sores or tenderness. Lungs: No shortness of breath or cough.  No hemoptysis. Cardiac:  No chest pain, palpitations, orthopnea, or PND. GI:  No nausea, vomiting, diarrhea, constipation, melena or hematochezia. GU:  Continues to have incontinence of urine.  Intermittent burning.  Recently had cystoscopy which revealed no evidence of malignancy.  Musculoskeletal:  No back pain.  No joint pain.  No muscle tenderness.Swelling of the right lower extremity ecchymosis.  To follow  Extremities:  No pain or swelling. Skin:  No rashes or skin changes. Neuro:  No headache, numbness or weakness, balance or coordination issues. Endocrine:  No diabetes, thyroid issues, hot flashes or night sweats. Psych:  No mood changes, depression or anxiety. Pain:  No focal pain. Review of systems:  All other systems reviewed and found to be negative.  As per HPI. Otherwise, a complete review of systems is negatve.  Preventive Screening:  Has patient had any of the following test? Mammography   Last Mammography: 2013   Smoking History: Smoking History Never Smoked.  PFSH: Family History: noncontributory  Comments: no maligancies  Social  History: negative alcohol, negative tobacco  Additional Past Medical and Surgical History: Med:   HTN for 9 years             celiac disease, not active,              seasonal allergies              arthritis of the small joints,  TIA 20 years ago no further symptoms.    Mitral wall prolapse without any significant cardiac arrhythmias or congestive heart failure.    Surg:     1963  hysterectomy for descensus               1996  cholecystectomy through the scope,               2000  sling procedure for incontinence (Dr. Kathyrn Lass)   ADVANCED DIRECTIVES:  No flowsheet data found.  HEALTH MAINTENANCE: Social History  Substance Use Topics  . Smoking status: Never Smoker   . Smokeless tobacco: None  . Alcohol Use: None      Allergies  Allergen Reactions  . Risedronate Sodium Rash    Aching, dysphagia  . Nitrofurantoin Other (See Comments)  . Sulfa Antibiotics Rash     OBJECTIVE:  Filed Vitals:   10/13/14 1537  BP: 121/74  Pulse: 64  Temp: 96 F (35.6 C)     There is no height on file to calculate BMI.    ECOG FS:1 - Symptomatic but completely ambulatory  PHYSICAL EXAM: General  status: Performance status is good.  Patient has not lost significant weight HEENT: No evidence of stomatitis. Sclera and conjunctivae :: No jaundice.   pale looking. Lungs: Air  entry equal on both sides.  No rhonchi.  No rales.  Cardiac: Heart sounds are normal.  No pericardial rub.  No murmur. Lymphatic system: Cervical, axillary, inguinal, lymph nodes not palpable GI: Abdomen is soft.  No ascites.  Liver spleen not palpable.  No tenderness.  Bowel sounds are within normal limit Lower extremity: Right lower extremity ecchymosis swelling.   Neurological system: Higher functions, cranial nerves intact no evidence of peripheral neuropathy. Skin: No rash.  No ecchymosis.Marland Kitchen\   LAB RESULTS:  Infusion on 10/13/2014  Component Date Value Ref Range Status  . WBC 10/13/2014 5.2  3.6 - 11.0 K/uL  Final  . RBC 10/13/2014 3.29* 3.80 - 5.20 MIL/uL Final  . Hemoglobin 10/13/2014 9.7* 12.0 - 16.0 g/dL Final  . HCT 10/13/2014 29.4* 35.0 - 47.0 % Final  . MCV 10/13/2014 89.3  80.0 - 100.0 fL Final  . MCH 10/13/2014 29.6  26.0 - 34.0 pg Final  . MCHC 10/13/2014 33.1  32.0 - 36.0 g/dL Final  . RDW 10/13/2014 15.7* 11.5 - 14.5 % Final  . Platelets 10/13/2014 367  150 - 440 K/uL Final  . Neutrophils Relative % 10/13/2014 56   Final  . Neutro Abs 10/13/2014 2.9  1.4 - 6.5 K/uL Final  . Lymphocytes Relative 10/13/2014 29   Final  . Lymphs Abs 10/13/2014 1.5  1.0 - 3.6 K/uL Final  . Monocytes Relative 10/13/2014 14   Final  . Monocytes Absolute 10/13/2014 0.7  0.2 - 0.9 K/uL Final  .  Eosinophils Relative 10/13/2014 1   Final  . Eosinophils Absolute 10/13/2014 0.0  0 - 0.7 K/uL Final  . Basophils Relative 10/13/2014 0   Final  . Basophils Absolute 10/13/2014 0.0  0 - 0.1 K/uL Final   Component     Latest Ref Rng 09/03/2013 11/01/2013 01/11/2014 02/10/2014 02/24/2014  CA 125     0.0 - 38.1 U/mL 37.8 (H) 59.4 (H) 59.6 (H) 32.1 27.9   Component     Latest Ref Rng 05/12/2014 06/02/2014 08/18/2014 10/13/2014  CA 125     0.0 - 38.1 U/mL 30.7 30.3 28.1 21.4       ASSESSMENT: 81 year old lady with recurrent ovarian cancer. Review of tumor markers shows stable disease.  No clinical evidence of recurrent disease.   Continuing bladder problems and had a recent cystoscopy negative for malignancy Patient continues to have burning and dysuria Doxycycline would be given. Swelling of the right lower extremity due to fall with drop in hemoglobin.  Patient was advised to go to the emergency room or contact primary care physician if there is any progressive redness bleeding or fever.  MEDICAL DECISION MAKING:  All lab data has been reviewed.  More markers have been reviewed Tumor markers are stable no further chemotherapy recommended.  Patient was started on nitrofurantoin to see whether that would help with  patient's complaint dysuria Right lower extremity swelling due to fall Anemia most likely bleeding in the right thigh  Patient expressed understanding and was in agreement with this plan. She also understands that She can call clinic at any time with any questions, concerns, or complaints.    No matching staging information was found for the patient.  Forest Gleason, MD   10/13/2014 3:48 PM

## 2014-10-14 LAB — CA 125: CA 125: 21.4 U/mL (ref 0.0–38.1)

## 2014-10-15 ENCOUNTER — Encounter: Payer: Self-pay | Admitting: Oncology

## 2014-10-16 ENCOUNTER — Encounter: Payer: Self-pay | Admitting: *Deleted

## 2014-10-16 ENCOUNTER — Ambulatory Visit: Payer: PRIVATE HEALTH INSURANCE

## 2014-10-16 ENCOUNTER — Ambulatory Visit
Admission: EM | Admit: 2014-10-16 | Discharge: 2014-10-16 | Disposition: A | Discharge status: Home / Self Care | Payer: PRIVATE HEALTH INSURANCE | Attending: Family Medicine | Admitting: Family Medicine

## 2014-10-16 DIAGNOSIS — S93509A Unspecified sprain of unspecified toe(s), initial encounter: Secondary | ICD-10-CM

## 2014-10-16 DIAGNOSIS — T148 Other injury of unspecified body region: Secondary | ICD-10-CM

## 2014-10-16 DIAGNOSIS — S93601A Unspecified sprain of right foot, initial encounter: Secondary | ICD-10-CM | POA: Diagnosis not present

## 2014-10-16 DIAGNOSIS — L03115 Cellulitis of right lower limb: Secondary | ICD-10-CM

## 2014-10-16 DIAGNOSIS — T148XXA Other injury of unspecified body region, initial encounter: Secondary | ICD-10-CM

## 2014-10-16 DIAGNOSIS — S8391XA Sprain of unspecified site of right knee, initial encounter: Secondary | ICD-10-CM | POA: Diagnosis not present

## 2014-10-16 DIAGNOSIS — S93401A Sprain of unspecified ligament of right ankle, initial encounter: Secondary | ICD-10-CM | POA: Diagnosis not present

## 2014-10-16 HISTORY — DX: Malignant (primary) neoplasm, unspecified: C80.1

## 2014-10-16 MED ORDER — ACETAMINOPHEN 500 MG PO TABS: 500.0000 mg | ORAL_TABLET | Freq: Four times a day (QID) | ORAL | Status: DC | PRN

## 2014-10-16 MED ORDER — CEPHALEXIN 500 MG PO CAPS: 500.0000 mg | ORAL_CAPSULE | Freq: Two times a day (BID) | ORAL | Status: AC

## 2014-10-16 NOTE — ED Notes (Signed)
Pt states she fell a week ago, landing on her right knee on the hard wood floor, seen by Dr Natale Lay at [facility], was told that if leg began to turn red and swell to be seen. Leg is red and swollen.

## 2014-10-16 NOTE — Discharge Instructions (Signed)
Acute Ankle Sprain with Phase I Rehab An acute ankle sprain is a partial or complete tear in one or more of the ligaments of the ankle due to traumatic injury. The severity of the injury depends on both the number of ligaments sprained and the grade of sprain. There are 3 grades of sprains.   A grade 1 sprain is a mild sprain. There is a slight pull without obvious tearing. There is no loss of strength, and the muscle and ligament are the correct length.  A grade 2 sprain is a moderate sprain. There is tearing of fibers within the substance of the ligament where it connects two bones or two cartilages. The length of the ligament is increased, and there is usually decreased strength.  A grade 3 sprain is a complete rupture of the ligament and is uncommon. In addition to the grade of sprain, there are three types of ankle sprains.  Lateral ankle sprains: This is a sprain of one or more of the three ligaments on the outer side (lateral) of the ankle. These are the most common sprains. Medial ankle sprains: There is one large triangular ligament of the inner side (medial) of the ankle that is susceptible to injury. Medial ankle sprains are less common. Syndesmosis, "high ankle," sprains: The syndesmosis is the ligament that connects the two bones of the lower leg. Syndesmosis sprains usually only occur with very severe ankle sprains. SYMPTOMS  Pain, tenderness, and swelling in the ankle, starting at the side of injury that may progress to the whole ankle and foot with time.  "Pop" or tearing sensation at the time of injury.  Bruising that may spread to the heel.  Impaired ability to walk soon after injury. CAUSES   Acute ankle sprains are caused by trauma placed on the ankle that temporarily forces or pries the anklebone (talus) out of its normal socket.  Stretching or tearing of the ligaments that normally hold the joint in place (usually due to a twisting injury). RISK INCREASES  WITH:  Previous ankle sprain.  Sports in which the foot may land awkwardly (i.e., basketball, volleyball, or soccer) or walking or running on uneven or rough surfaces.  Shoes with inadequate support to prevent sideways motion when stress occurs.  Poor strength and flexibility.  Poor balance skills.  Contact sports. PREVENTION   Warm up and stretch properly before activity.  Maintain physical fitness:  Ankle and leg flexibility, muscle strength, and endurance.  Cardiovascular fitness.  Balance training activities.  Use proper technique and have a coach correct improper technique.  Taping, protective strapping, bracing, or high-top tennis shoes may help prevent injury. Initially, tape is best; however, it loses most of its support function within 10 to 15 minutes.  Wear proper-fitted protective shoes (High-top shoes with taping or bracing is more effective than either alone).  Provide the ankle with support during sports and practice activities for 12 months following injury. PROGNOSIS   If treated properly, ankle sprains can be expected to recover completely; however, the length of recovery depends on the degree of injury.  A grade 1 sprain usually heals enough in 5 to 7 days to allow modified activity and requires an average of 6 weeks to heal completely.  A grade 2 sprain requires 6 to 10 weeks to heal completely.  A grade 3 sprain requires 12 to 16 weeks to heal.  A syndesmosis sprain often takes more than 3 months to heal. RELATED COMPLICATIONS   Frequent recurrence of symptoms may  result in a chronic problem. Appropriately addressing the problem the first time decreases the frequency of recurrence and optimizes healing time. Severity of the initial sprain does not predict the likelihood of later instability.  Injury to other structures (bone, cartilage, or tendon).  A chronically unstable or arthritic ankle joint is a possibility with repeated  sprains. TREATMENT Treatment initially involves the use of ice, medication, and compression bandages to help reduce pain and inflammation. Ankle sprains are usually immobilized in a walking cast or boot to allow for healing. Crutches may be recommended to reduce pressure on the injury. After immobilization, strengthening and stretching exercises may be necessary to regain strength and a full range of motion. Surgery is rarely needed to treat ankle sprains. MEDICATION   Nonsteroidal anti-inflammatory medications, such as aspirin and ibuprofen (do not take for the first 3 days after injury or within 7 days before surgery), or other minor pain relievers, such as acetaminophen, are often recommended. Take these as directed by your caregiver. Contact your caregiver immediately if any bleeding, stomach upset, or signs of an allergic reaction occur from these medications.  Ointments applied to the skin may be helpful.  Pain relievers may be prescribed as necessary by your caregiver. Do not take prescription pain medication for longer than 4 to 7 days. Use only as directed and only as much as you need. HEAT AND COLD  Cold treatment (icing) is used to relieve pain and reduce inflammation for acute and chronic cases. Cold should be applied for 10 to 15 minutes every 2 to 3 hours for inflammation and pain and immediately after any activity that aggravates your symptoms. Use ice packs or an ice massage.  Heat treatment may be used before performing stretching and strengthening activities prescribed by your caregiver. Use a heat pack or a warm soak. SEEK IMMEDIATE MEDICAL CARE IF:   Pain, swelling, or bruising worsens despite treatment.  You experience pain, numbness, discoloration, or coldness in the foot or toes.  New, unexplained symptoms develop (drugs used in treatment may produce side effects.) EXERCISES  PHASE I EXERCISES RANGE OF MOTION (ROM) AND STRETCHING EXERCISES - Ankle Sprain, Acute Phase I,  Weeks 1 to 2 These exercises may help you when beginning to restore flexibility in your ankle. You will likely work on these exercises for the 1 to 2 weeks after your injury. Once your physician, physical therapist, or athletic trainer sees adequate progress, he or she will advance your exercises. While completing these exercises, remember:   Restoring tissue flexibility helps normal motion to return to the joints. This allows healthier, less painful movement and activity.  An effective stretch should be held for at least 30 seconds.  A stretch should never be painful. You should only feel a gentle lengthening or release in the stretched tissue. RANGE OF MOTION - Dorsi/Plantar Flexion  While sitting with your right / left knee straight, draw the top of your foot upwards by flexing your ankle. Then reverse the motion, pointing your toes downward.  Hold each position for __________ seconds.  After completing your first set of exercises, repeat this exercise with your knee bent. Repeat __________ times. Complete this exercise __________ times per day.  RANGE OF MOTION - Ankle Alphabet  Imagine your right / left big toe is a pen.  Keeping your hip and knee still, write out the entire alphabet with your "pen." Make the letters as large as you can without increasing any discomfort. Repeat __________ times. Complete this exercise __________  times per day.  STRENGTHENING EXERCISES - Ankle Sprain, Acute -Phase I, Weeks 1 to 2 These exercises may help you when beginning to restore strength in your ankle. You will likely work on these exercises for 1 to 2 weeks after your injury. Once your physician, physical therapist, or athletic trainer sees adequate progress, he or she will advance your exercises. While completing these exercises, remember:   Muscles can gain both the endurance and the strength needed for everyday activities through controlled exercises.  Complete these exercises as instructed by  your physician, physical therapist, or athletic trainer. Progress the resistance and repetitions only as guided.  You may experience muscle soreness or fatigue, but the pain or discomfort you are trying to eliminate should never worsen during these exercises. If this pain does worsen, stop and make certain you are following the directions exactly. If the pain is still present after adjustments, discontinue the exercise until you can discuss the trouble with your clinician. STRENGTH - Dorsiflexors  Secure a rubber exercise band/tubing to a fixed object (i.e., table, pole) and loop the other end around your right / left foot.  Sit on the floor facing the fixed object. The band/tubing should be slightly tense when your foot is relaxed.  Slowly draw your foot back toward you using your ankle and toes.  Hold this position for __________ seconds. Slowly release the tension in the band and return your foot to the starting position. Repeat __________ times. Complete this exercise __________ times per day.  STRENGTH - Plantar-flexors   Sit with your right / left leg extended. Holding onto both ends of a rubber exercise band/tubing, loop it around the ball of your foot. Keep a slight tension in the band.  Slowly push your toes away from you, pointing them downward.  Hold this position for __________ seconds. Return slowly, controlling the tension in the band/tubing. Repeat __________ times. Complete this exercise __________ times per day.  STRENGTH - Ankle Eversion  Secure one end of a rubber exercise band/tubing to a fixed object (table, pole). Loop the other end around your foot just before your toes.  Place your fists between your knees. This will focus your strengthening at your ankle.  Drawing the band/tubing across your opposite foot, slowly, pull your little toe out and up. Make sure the band/tubing is positioned to resist the entire motion.  Hold this position for __________ seconds. Have  your muscles resist the band/tubing as it slowly pulls your foot back to the starting position.  Repeat __________ times. Complete this exercise __________ times per day.  STRENGTH - Ankle Inversion  Secure one end of a rubber exercise band/tubing to a fixed object (table, pole). Loop the other end around your foot just before your toes.  Place your fists between your knees. This will focus your strengthening at your ankle.  Slowly, pull your big toe up and in, making sure the band/tubing is positioned to resist the entire motion.  Hold this position for __________ seconds.  Have your muscles resist the band/tubing as it slowly pulls your foot back to the starting position. Repeat __________ times. Complete this exercises __________ times per day.  STRENGTH - Towel Curls  Sit in a chair positioned on a non-carpeted surface.  Place your right / left foot on a towel, keeping your heel on the floor.  Pull the towel toward your heel by only curling your toes. Keep your heel on the floor.  If instructed by your physician, physical therapist,   or athletic trainer, add weight to the end of the towel. Repeat __________ times. Complete this exercise __________ times per day. Document Released: 08/22/2004 Document Revised: 06/07/2013 Document Reviewed: 05/05/2008 [facility] Patient Information 2015 [place], [place]. This information is not intended to replace advice given to you by your health care provider. Make sure you discuss any questions you have with your health care provider.  Acute Ankle Sprain with Phase I Rehab An acute ankle sprain is a partial or complete tear in one or more of the ligaments of the ankle due to traumatic injury. The severity of the injury depends on both the number of ligaments sprained and the grade of sprain. There are 3 grades of sprains.   A grade 1 sprain is a mild sprain. There is a slight pull without obvious tearing. There is no loss of strength, and the muscle  and ligament are the correct length.  A grade 2 sprain is a moderate sprain. There is tearing of fibers within the substance of the ligament where it connects two bones or two cartilages. The length of the ligament is increased, and there is usually decreased strength.  A grade 3 sprain is a complete rupture of the ligament and is uncommon. In addition to the grade of sprain, there are three types of ankle sprains.  Lateral ankle sprains: This is a sprain of one or more of the three ligaments on the outer side (lateral) of the ankle. These are the most common sprains. Medial ankle sprains: There is one large triangular ligament of the inner side (medial) of the ankle that is susceptible to injury. Medial ankle sprains are less common. Syndesmosis, "high ankle," sprains: The syndesmosis is the ligament that connects the two bones of the lower leg. Syndesmosis sprains usually only occur with very severe ankle sprains. SYMPTOMS  Pain, tenderness, and swelling in the ankle, starting at the side of injury that may progress to the whole ankle and foot with time.  "Pop" or tearing sensation at the time of injury.  Bruising that may spread to the heel.  Impaired ability to walk soon after injury. CAUSES   Acute ankle sprains are caused by trauma placed on the ankle that temporarily forces or pries the anklebone (talus) out of its normal socket.  Stretching or tearing of the ligaments that normally hold the joint in place (usually due to a twisting injury). RISK INCREASES WITH:  Previous ankle sprain.  Sports in which the foot may land awkwardly (i.e., basketball, volleyball, or soccer) or walking or running on uneven or rough surfaces.  Shoes with inadequate support to prevent sideways motion when stress occurs.  Poor strength and flexibility.  Poor balance skills.  Contact sports. PREVENTION   Warm up and stretch properly before activity.  Maintain physical fitness:  Ankle and leg  flexibility, muscle strength, and endurance.  Cardiovascular fitness.  Balance training activities.  Use proper technique and have a coach correct improper technique.  Taping, protective strapping, bracing, or high-top tennis shoes may help prevent injury. Initially, tape is best; however, it loses most of its support function within 10 to 15 minutes.  Wear proper-fitted protective shoes (High-top shoes with taping or bracing is more effective than either alone).  Provide the ankle with support during sports and practice activities for 12 months following injury. PROGNOSIS   If treated properly, ankle sprains can be expected to recover completely; however, the length of recovery depends on the degree of injury.  A grade 1 sprain usually  heals enough in 5 to 7 days to allow modified activity and requires an average of 6 weeks to heal completely.  A grade 2 sprain requires 6 to 10 weeks to heal completely.  A grade 3 sprain requires 12 to 16 weeks to heal.  A syndesmosis sprain often takes more than 3 months to heal. RELATED COMPLICATIONS   Frequent recurrence of symptoms may result in a chronic problem. Appropriately addressing the problem the first time decreases the frequency of recurrence and optimizes healing time. Severity of the initial sprain does not predict the likelihood of later instability.  Injury to other structures (bone, cartilage, or tendon).  A chronically unstable or arthritic ankle joint is a possibility with repeated sprains. TREATMENT Treatment initially involves the use of ice, medication, and compression bandages to help reduce pain and inflammation. Ankle sprains are usually immobilized in a walking cast or boot to allow for healing. Crutches may be recommended to reduce pressure on the injury. After immobilization, strengthening and stretching exercises may be necessary to regain strength and a full range of motion. Surgery is rarely needed to treat ankle  sprains. MEDICATION   Nonsteroidal anti-inflammatory medications, such as aspirin and ibuprofen (do not take for the first 3 days after injury or within 7 days before surgery), or other minor pain relievers, such as acetaminophen, are often recommended. Take these as directed by your caregiver. Contact your caregiver immediately if any bleeding, stomach upset, or signs of an allergic reaction occur from these medications.  Ointments applied to the skin may be helpful.  Pain relievers may be prescribed as necessary by your caregiver. Do not take prescription pain medication for longer than 4 to 7 days. Use only as directed and only as much as you need. HEAT AND COLD  Cold treatment (icing) is used to relieve pain and reduce inflammation for acute and chronic cases. Cold should be applied for 10 to 15 minutes every 2 to 3 hours for inflammation and pain and immediately after any activity that aggravates your symptoms. Use ice packs or an ice massage.  Heat treatment may be used before performing stretching and strengthening activities prescribed by your caregiver. Use a heat pack or a warm soak. SEEK IMMEDIATE MEDICAL CARE IF:   Pain, swelling, or bruising worsens despite treatment.  You experience pain, numbness, discoloration, or coldness in the foot or toes.  New, unexplained symptoms develop (drugs used in treatment may produce side effects.) EXERCISES  PHASE I EXERCISES RANGE OF MOTION (ROM) AND STRETCHING EXERCISES - Ankle Sprain, Acute Phase I, Weeks 1 to 2 These exercises may help you when beginning to restore flexibility in your ankle. You will likely work on these exercises for the 1 to 2 weeks after your injury. Once your physician, physical therapist, or athletic trainer sees adequate progress, he or she will advance your exercises. While completing these exercises, remember:   Restoring tissue flexibility helps normal motion to return to the joints. This allows healthier, less  painful movement and activity.  An effective stretch should be held for at least 30 seconds.  A stretch should never be painful. You should only feel a gentle lengthening or release in the stretched tissue. RANGE OF MOTION - Dorsi/Plantar Flexion  While sitting with your right / left knee straight, draw the top of your foot upwards by flexing your ankle. Then reverse the motion, pointing your toes downward.  Hold each position for __________ seconds.  After completing your first set of exercises, repeat this  exercise with your knee bent. Repeat __________ times. Complete this exercise __________ times per day.  RANGE OF MOTION - Ankle Alphabet  Imagine your right / left big toe is a pen.  Keeping your hip and knee still, write out the entire alphabet with your "pen." Make the letters as large as you can without increasing any discomfort. Repeat __________ times. Complete this exercise __________ times per day.  STRENGTHENING EXERCISES - Ankle Sprain, Acute -Phase I, Weeks 1 to 2 These exercises may help you when beginning to restore strength in your ankle. You will likely work on these exercises for 1 to 2 weeks after your injury. Once your physician, physical therapist, or athletic trainer sees adequate progress, he or she will advance your exercises. While completing these exercises, remember:   Muscles can gain both the endurance and the strength needed for everyday activities through controlled exercises.  Complete these exercises as instructed by your physician, physical therapist, or athletic trainer. Progress the resistance and repetitions only as guided.  You may experience muscle soreness or fatigue, but the pain or discomfort you are trying to eliminate should never worsen during these exercises. If this pain does worsen, stop and make certain you are following the directions exactly. If the pain is still present after adjustments, discontinue the exercise until you can discuss  the trouble with your clinician. STRENGTH - Dorsiflexors  Secure a rubber exercise band/tubing to a fixed object (i.e., table, pole) and loop the other end around your right / left foot.  Sit on the floor facing the fixed object. The band/tubing should be slightly tense when your foot is relaxed.  Slowly draw your foot back toward you using your ankle and toes.  Hold this position for __________ seconds. Slowly release the tension in the band and return your foot to the starting position. Repeat __________ times. Complete this exercise __________ times per day.  STRENGTH - Plantar-flexors   Sit with your right / left leg extended. Holding onto both ends of a rubber exercise band/tubing, loop it around the ball of your foot. Keep a slight tension in the band.  Slowly push your toes away from you, pointing them downward.  Hold this position for __________ seconds. Return slowly, controlling the tension in the band/tubing. Repeat __________ times. Complete this exercise __________ times per day.  STRENGTH - Ankle Eversion  Secure one end of a rubber exercise band/tubing to a fixed object (table, pole). Loop the other end around your foot just before your toes.  Place your fists between your knees. This will focus your strengthening at your ankle.  Drawing the band/tubing across your opposite foot, slowly, pull your little toe out and up. Make sure the band/tubing is positioned to resist the entire motion.  Hold this position for __________ seconds. Have your muscles resist the band/tubing as it slowly pulls your foot back to the starting position.  Repeat __________ times. Complete this exercise __________ times per day.  STRENGTH - Ankle Inversion  Secure one end of a rubber exercise band/tubing to a fixed object (table, pole). Loop the other end around your foot just before your toes.  Place your fists between your knees. This will focus your strengthening at your ankle.  Slowly,  pull your big toe up and in, making sure the band/tubing is positioned to resist the entire motion.  Hold this position for __________ seconds.  Have your muscles resist the band/tubing as it slowly pulls your foot back to the starting position.  Repeat __________ times. Complete this exercises __________ times per day.  STRENGTH - Towel Curls  Sit in a chair positioned on a non-carpeted surface.  Place your right / left foot on a towel, keeping your heel on the floor. Pull the towel toward your heel by only curling your toes. Keep your heel on the floor.Turf Toe, with Rehab Injury to the base of the big toe (first metatarsal phalangeal joint) that causes damage to the joint capsule and ligaments is known as turf toe. Turf toe commonly occurs on the bottom side of the joint. SYMPTOMS  Pain, tenderness, inflammation and/or bruising around the big toe (contusion). Pain that worsens with movement of the big toe, specifically when raising (extending) the toe. Inability to walk properly on the affected foot, which causes one to limp. CAUSES  Turf toe is caused by a force being placed on the joint capsule and ligaments that is greater than they can withstand. Common mechanisms of injury include: Repetitive and/or strenuous extension of the big toe (standing on tiptoes). Explosive running starts (sprinters). "Stubbing" the big toe. Another player landing on your foot. RISK INCREASES WITH: Previous toe injury. Having a long first toe. Flat feet. Arthritis of the great toe. Improperly fitted shoes or shoes that are not appropriate for a given activity. Family history of foot abnormalities. Activities that involve explosive running starts, standing on tiptoes, or jumping. PREVENTION Wear properly fitted shoes that are appropriate for the sport or activity. Protect the first toe by taping it to reduce motion. Maintain physical fitness: Strength, flexibility, and endurance. Cardiovascular  fitness. PROGNOSIS  If treated properly, the symptoms of turf toe usually resolve with non-surgical (conservative) treatment. Occasionally, surgery is necessary. RELATED COMPLICATIONS Recurrent symptoms that result in a chronic problem. Inability to compete in athletics. Prolonged healing time, if improperly treated or re-injured. Other foot injuries that occur due to protecting the first toe from pain. Loss of motion in the first toe (hallux rigidus). Bunion (hallux valgus). TREATMENT  Treatment initially involves resting from any activities that aggravate the symptoms, and the use of ice and medications to help reduce pain and inflammation. The use of range-of-motion exercises may help reduce pain with activity. It is important that you wear properly fitted shoes with a stiff sole and a wide toe box, in order to reduce the pressure on the first toe. Protecting your big toe by taping it to restrict movement may allow you to return to sports earlier without pain or discomfort. If the condition becomes chronic, then your caregiver may recommend a corticosteroid injection to help reduce inflammation. If symptoms persist despite non-surgical treatment, then surgery may be recommended. MEDICATION If pain medication is necessary, then nonsteroidal anti-inflammatory medications, such as aspirin and ibuprofen, or other minor pain relievers, such as acetaminophen, are often recommended. Do not take pain medication for 7 days before surgery. Prescription pain relievers may be given if deemed necessary by your caregiver. Use only as directed and only as much as you need. Ointments applied to the skin may be helpful. Corticosteroid injections may be given by your caregiver. These injections should be reserved for the most serious cases, because they may only be given a certain number of times. HEAT AND COLD Cold treatment (icing) relieves pain and reduces inflammation. Cold treatment should be applied for 10  to 15 minutes every 2 to 3 hours for inflammation and pain and immediately after any activity that aggravates your symptoms. Use ice packs or massage the area with a  piece of ice (ice massage). Heat treatment may be used prior to performing the stretching and strengthening activities prescribed by your caregiver, physical therapist, or athletic trainer. Use a heat pack or soak the injury in warm water. SEEK MEDICAL CARE IF: Treatment seems to offer no benefit, or the condition worsens. Any medications produce adverse side effects. EXERCISES RANGE OF MOTION (ROM) AND STRETCHING EXERCISES - Turf Toe These exercises may help you when beginning to rehabilitate your injury. Your symptoms may resolve with or without further involvement from your physician, physical therapist, or athletic trainer. While completing these exercises, remember: Restoring tissue flexibility helps normal motion to return to the joints. This allows healthier, less painful movement and activity. An effective stretch should be held for at least 30 seconds. A stretch should never be painful. You should only feel a gentle lengthening or release in the stretched tissue. RANGE OF MOTION - Toe Extension, Flexion Sit with your right / left leg crossed over your opposite knee. Grasp your toes and gently pull them back toward the top of your foot. You should feel a stretch on the bottom of your toes and/or foot. Hold this stretch for __________ seconds. Now, gently pull your toes toward the bottom of your foot. You should feel a stretch on the top of your toes and or foot. Hold this stretch for __________ seconds. Repeat __________ times. Complete this stretch __________ times per day. RANGE OF MOTION - Ankle Plantar Flexion Sit with your right / left leg crossed over your opposite knee. Use your opposite hand to pull the top of your foot and toes toward you. You should feel a gentle stretch on the top of your foot/ankle. Hold this  position for __________ seconds. Repeat __________ times. Complete __________ times per day. STRENGTHENING EXERCISES - Turf Toe These exercises may help you when beginning to rehabilitate your injury. They may resolve your symptoms with or without further involvement from your physician, physical therapist, or athletic trainer. While completing these exercises, remember: Muscles can gain both the endurance and the strength needed for everyday activities through controlled exercises. Complete these exercises as instructed by your physician, physical therapist, or athletic trainer. Progress with the resistance and repetition exercises only as your caregiver advises. You may experience muscle soreness or fatigue, but the pain or discomfort you are trying to eliminate should never worsen during these exercises. If this pain does worsen, stop and make certain you are following the directions exactly. If the pain is still present after adjustments, discontinue the exercise until you can discuss the trouble with your clinician. STRENGTH - Towel Curls Sit in a chair positioned on a non-carpeted surface. Place your foot on a towel, keeping your heel on the floor. Pull the towel toward your heel by only curling your toes. Keep your heel on the floor. If instructed by your physician, physical therapist or athletic trainer, add ____________________ at the end of the towel. Repeat __________ times. Complete this exercise __________ times per day. Document Released: 01/21/2005 Document Revised: 06/07/2013 Document Reviewed: 05/05/2008 [facility] Patient Information 2015 [place], [place]. This information is not intended to replace advice given to you by your health care provider. Make sure you discuss any questions you have with your health care provider. Foot Sprain The muscles and cord like structures which attach muscle to bone (tendons) that surround the feet are made up of units. A foot sprain can occur at the  weakest spot in any of these units. This condition is most often  caused by injury to or overuse of the foot, as from playing contact sports, or aggravating a previous injury, or from poor conditioning, or obesity. SYMPTOMS Pain with movement of the foot. Tenderness and swelling at the injury site. Loss of strength is present in moderate or severe sprains. THE THREE GRADES OR SEVERITY OF FOOT SPRAIN ARE: Mild (Grade I): Slightly pulled muscle without tearing of muscle or tendon fibers or loss of strength. Moderate (Grade II): Tearing of fibers in a muscle, tendon, or at the attachment to bone, with small decrease in strength. Severe (Grade III): Rupture of the muscle-tendon-bone attachment, with separation of fibers. Severe sprain requires surgical repair. Often repeating (chronic) sprains are caused by overuse. Sudden (acute) sprains are caused by direct injury or over-use. DIAGNOSIS  Diagnosis of this condition is usually by your own observation. If problems continue, a caregiver may be required for further evaluation and treatment. X-rays may be required to make sure there are not breaks in the bones (fractures) present. Continued problems may require physical therapy for treatment. PREVENTION Use strength and conditioning exercises appropriate for your sport. Warm up properly prior to working out. Use athletic shoes that are made for the sport you are participating in. Allow adequate time for healing. Early return to activities makes repeat injury more likely, and can lead to an unstable arthritic foot that can result in prolonged disability. Mild sprains generally heal in 3 to 10 days, with moderate and severe sprains taking 2 to 10 weeks. Your caregiver can help you determine the proper time required for healing. HOME CARE INSTRUCTIONS  Apply ice to the injury for 15-20 minutes, 03-04 times per day. Put the ice in a plastic bag and place a towel between the bag of ice and your skin. An elastic  wrap (like an Ace bandage) may be used to keep swelling down. Keep foot above the level of the heart, or at least raised on a footstool, when swelling and pain are present. Try to avoid use other than gentle range of motion while the foot is painful. Do not resume use until instructed by your caregiver. Then begin use gradually, not increasing use to the point of pain. If pain does develop, decrease use and continue the above measures, gradually increasing activities that do not cause discomfort, until you gradually achieve normal use. Use crutches if and as instructed, and for the length of time instructed. Keep injured foot and ankle wrapped between treatments. Massage foot and ankle for comfort and to keep swelling down. Massage from the toes up towards the knee. Only take over-the-counter or prescription medicines for pain, discomfort, or fever as directed by your caregiver. SEEK IMMEDIATE MEDICAL CARE IF:  Your pain and swelling increase, or pain is not controlled with medications. You have loss of feeling in your foot or your foot turns cold or blue. You develop new, unexplained symptoms, or an increase of the symptoms that brought you to your caregiver. MAKE SURE YOU:  Understand these instructions. Will watch your condition. Will get help right away if you are not doing well or get worse. Document Released: 07/13/2001 Document Revised: 04/15/2011 Document Reviewed: 09/10/2007 [facility] Patient Information 2015 [place], [place]. This information is not intended to replace advice given to you by your health care provider. Make sure you discuss any questions you have with your health care provider. Contusion A contusion is a deep bruise. Contusions are the result of an injury that caused bleeding under the skin. The contusion may turn  blue, purple, or yellow. Minor injuries will give you a painless contusion, but more severe contusions may stay painful and swollen for a few weeks.  CAUSES  A  contusion is usually caused by a blow, trauma, or direct force to an area of the body. SYMPTOMS  Swelling and redness of the injured area. Bruising of the injured area. Tenderness and soreness of the injured area. Pain. DIAGNOSIS  The diagnosis can be made by taking a history and physical exam. An X-ray, CT scan, or MRI may be needed to determine if there were any associated injuries, such as fractures. TREATMENT  Specific treatment will depend on what area of the body was injured. In general, the best treatment for a contusion is resting, icing, elevating, and applying cold compresses to the injured area. Over-the-counter medicines may also be recommended for pain control. Ask your caregiver what the best treatment is for your contusion. HOME CARE INSTRUCTIONS  Put ice on the injured area. Put ice in a plastic bag. Place a towel between your skin and the bag. Leave the ice on for 15-20 minutes, 3-4 times a day, or as directed by your health care provider. Only take over-the-counter or prescription medicines for pain, discomfort, or fever as directed by your caregiver. Your caregiver may recommend avoiding anti-inflammatory medicines (aspirin, ibuprofen, and naproxen) for 48 hours because these medicines may increase bruising. Rest the injured area. If possible, elevate the injured area to reduce swelling. SEEK IMMEDIATE MEDICAL CARE IF:  You have increased bruising or swelling. You have pain that is getting worse. Your swelling or pain is not relieved with medicines. MAKE SURE YOU:  Understand these instructions. Will watch your condition. Will get help right away if you are not doing well or get worse. Document Released: 10/31/2004 Document Revised: 01/26/2013 Document Reviewed: 11/26/2010 [facility] Patient Information 2015 [place], [place]. This information is not intended to replace advice given to you by your health care provider. Make sure you discuss any questions you have with your  health care provider.  If instructed by your physician, physical therapist, or athletic trainer, add weight to the end of the towel. Repeat __________ times. Complete this exercise __________ times per day. Document Released: 08/22/2004 Document Revised: 06/07/2013 Document Reviewed: 05/05/2008 [facility] Patient Information 2015 [place], [place]. This information is not intended to replace advice given to you by your health care provider. Make sure you discuss any questions you have with your health care provider. Combined Knee Ligament Sprain Combined knee ligament sprain is a tear of more than one of the major ligaments of the knee. The four knee ligaments are the anterior cruciate ligament (ACL), posterior cruciate ligament (PCL), medial collateral ligament (MCL) and lateral collateral ligament (LCL). Ligaments connect bones. They often cross a joint to hold the bones together. The ligaments of the knee keep the thigh bone (femur) and shinbone (tibia) in alignment. These ligaments allow the joint to move within a certain range of motion. Movement outside this range causes a ligament strain. Injury to multiple ligaments at the same time results in difficulty playing sports and in daily living. The most common multiple knee ligament injury involves the ACL and MCL. SYMPTOMS   A "popping" sound heard or felt at the time of injury.  Inability to continue activity after injury.  Inflammation of the knee within 6 hours after injury.  Possibly, deformity of the knee.  Inability to straighten the knee.  Feeling of the knee giving way or buckling.  Sometimes, locking of  the knee, if the joint cartilage (meniscus) is injured.  Rarely, numbness, weakness, paralysis, discoloration, or coldness, due to nerve or blood vessel injury. CAUSES  Spraining of multiple ligaments occurs when a force is placed on the ligaments that exceeds their strength. This is often caused by a direct hit (trauma). It may  also be caused by a non-contact injury (hyperextending the knee while twisting it).  RISK INCREASES WITH:  Contact sports (football, rugby, lacrosse). Sports that involve pivoting, jumping, cutting, or changing direction (basketball, gymnastics, soccer, volleyball). Sports on uneven ground (cross-country running, soccer).  Poor strength and/or flexibility.  Improper fitted or padded equipment. PREVENTION  Warm up and stretch properly before activity.  Maintain physical fitness:  Thigh, leg, and knee flexibility.  Muscle strength and endurance.  Learn and use proper exercise technique.  Wear proper and well fitting equipment (correct length of cleats for surface). PROGNOSIS  Without treatment, the knee will continue to give way and become vulnerable to recurring injury. Recurring injury can happen during athletics or daily living. If the injury includes damage to a nerve or artery, the chance of a poor outcome increases. Surgery is often needed to regain stability of the knee. RELATED COMPLICATIONS  Frequently recurring symptoms, including:  Knee giving way.  Joint instability.  Inflammation.  Injury to the joint cartilage (meniscus). This may result in locking and/or swelling of the knee.  Injury to joint (articular) cartilage of the thigh bone or shinbone. This may result in arthritis of the knee.  Injury to other ligaments of the knee.  Knee stiffness (loss of knee motion).  Permanent injury to nerves (numbness, weakness, or paralysis) or arteries.  Removal (amputation) of the leg, due to nerve or artery injury. TREATMENT  Treatment first involves medicine and ice, to reduce pain and inflammation. Crutches may be advised, to decrease pain while walking. The knee may be restrained. Rehabilitation focuses on reducing swelling, regaining range of motion, and regaining muscle control and strength. It may also include receiving proper use training, wearing a brace, and  education. (Avoid sports that involve pivoting, cutting, changing direction, jumping and landing). Surgery often offers the best chance for full recovery. Surgery from combined ACL/MCL injury involves replacement (reconstruction) of the ACL. This also allows for MCL healing. Despite surgery, some athletes may never return to their prior level of competition. The ability to return to sports depends on the related injuries and demands of the sport.  MEDICATION   If pain medicine is needed, nonsteroidal anti-inflammatory medicines (aspirin and ibuprofen), or other minor pain relievers (acetaminophen), are often advised.  Do not take pain medicine for 7 days before surgery.  Stronger pain relievers may be prescribed. Use only as directed and only as much as you need.  Contact your caregiver immediately if any bleeding, stomach upset, or signs of an allergic reaction occur. COLD THERAPY  Cold treatment (icing) should be applied for 10 to 15 minutes every 2 to 3 hours for inflammation and pain, and immediately after activity that aggravates your symptoms. Use ice packs or an ice massage. SEEK MEDICAL CARE IF:   Symptoms get worse or do not improve in 6 weeks, despite treatment.  After injury or surgery, any of the following occur:  Pain, numbness, coldness, or a blue, gray, or dark color occurs in the foot or toenails.  Increased pain, swelling, redness, drainage of fluids, or bleeding in the affected area.  Signs of infection (headache, muscle aches, dizziness, or a general ill feeling with  fever).  New, unexplained symptoms develop. (Drugs used in treatment may produce side effects.) Document Released: 01/21/2005 Document Revised: 04/15/2011 Document Reviewed: 05/05/2008 [facility] Patient Information 2015 [place], [place]. This information is not intended to replace advice given to you by your health care provider. Make sure you discuss any questions you have with your health care  provider. Cellulitis Cellulitis is an infection of the skin and the tissue beneath it. The infected area is usually red and tender. Cellulitis occurs most often in the arms and lower legs.  CAUSES  Cellulitis is caused by bacteria that enter the skin through cracks or cuts in the skin. The most common types of bacteria that cause cellulitis are staphylococci and streptococci. SIGNS AND SYMPTOMS   Redness and warmth.  Swelling.  Tenderness or pain.  Fever. DIAGNOSIS  Your health care provider can usually determine what is wrong based on a physical exam. Blood tests may also be done. TREATMENT  Treatment usually involves taking an antibiotic medicine. HOME CARE INSTRUCTIONS   Take your antibiotic medicine as directed by your health care provider. Finish the antibiotic even if you start to feel better.  Keep the infected arm or leg elevated to reduce swelling.  Apply a warm cloth to the affected area up to 4 times per day to relieve pain.  Take medicines only as directed by your health care provider.  Keep all follow-up visits as directed by your health care provider. SEEK MEDICAL CARE IF:   You notice red streaks coming from the infected area.  Your red area gets larger or turns dark in color.  Your bone or joint underneath the infected area becomes painful after the skin has healed.  Your infection returns in the same area or another area.  You notice a swollen bump in the infected area.  You develop new symptoms.  You have a fever. SEEK IMMEDIATE MEDICAL CARE IF:   You feel very sleepy.  You develop vomiting or diarrhea.  You have a general ill feeling (malaise) with muscle aches and pains. MAKE SURE YOU:   Understand these instructions.  Will watch your condition.  Will get help right away if you are not doing well or get worse. Document Released: 10/31/2004 Document Revised: 06/07/2013 Document Reviewed: 04/08/2011 [facility] Patient Information 2015  [place], [place]. This information is not intended to replace advice given to you by your health care provider. Make sure you discuss any questions you have with your health care provider.

## 2014-10-18 NOTE — ED Provider Notes (Signed)
CSN: [id number]     Arrival date & time 10/16/14  1531 History   First MD Initiated Contact with Patient 10/16/14 1609     Chief Complaint  Patient presents with  . 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  Right leg/knee have been bruised and swollen but now knee red and lower leg redness/pain.  Husband accompanied patient and she uses cane. Patient able to put on shoes but feet swollen.   Denied loss of consciousness/hitting head, cuts on legs.  Patient is a 81 y.o. female presenting with fall. The history is provided by the patient and the spouse.  Fall This is a new problem. The current episode started more than 1 week ago. The problem has been gradually worsening. Pertinent negatives include no chest pain, no abdominal pain, no headaches and no shortness of breath. The symptoms are aggravated by walking. Nothing relieves the symptoms. She has tried acetaminophen, food, water, a cold compress and rest for the symptoms.    Past Medical History  Diagnosis Date  . Cancer     Ovarian    History reviewed. No pertinent past surgical history. No family history on file. Social History  Substance Use Topics  . Smoking status: Never Smoker   . Smokeless tobacco: None  . Alcohol Use: No   OB History    No data available     Review of Systems  Constitutional: Negative for fever, chills, diaphoresis, activity change, appetite change and fatigue.  HENT: Negative for congestion, dental problem, drooling, ear discharge, ear pain, facial swelling, trouble swallowing and voice change.   Eyes: Negative for photophobia, pain, discharge, redness, itching and visual disturbance.  Respiratory: Negative for cough, choking, shortness of breath, wheezing and stridor.     Cardiovascular: Negative for chest pain, palpitations and leg swelling.  Gastrointestinal: Negative for nausea, vomiting, abdominal pain, diarrhea, constipation, blood in stool and abdominal distention.  Endocrine: Negative for cold intolerance and heat intolerance.  Genitourinary: Negative for dysuria.  Musculoskeletal: Positive for myalgias, joint swelling, arthralgias and gait problem. Negative for back pain, neck pain and neck stiffness.  Skin: Positive for color change and rash. Negative for pallor and wound.  Allergic/Immunologic: Positive for environmental allergies. Negative for food allergies.  Neurological: Negative for dizziness, tremors, seizures, syncope, facial asymmetry, speech difficulty, weakness, light-headedness, numbness and headaches.  Hematological: Negative for adenopathy. Does not bruise/bleed easily.  Psychiatric/Behavioral: Negative for behavioral problems, confusion, sleep disturbance and agitation.    Allergies  Risedronate sodium; Nitrofurantoin; and Sulfa antibiotics  Home Medications   Prior to Admission medications   Medication Sig Start Date End Date Taking? Authorizing Provider  aspirin EC 81 MG tablet Take 81 mg by mouth.   Yes Historical Provider, MD  cetirizine (ZYRTEC) 10 MG tablet Take by mouth.   Yes Historical Provider, MD  Cholecalciferol (VITAMIN D3) 2000 UNITS capsule Take by mouth.   Yes Historical Provider, MD  docusate sodium (COLACE) 100 MG capsule Take 100 mg by mouth.   Yes Historical Provider, MD  doxycycline (VIBRA-TABS) 100 MG tablet Take 1 tablet (100 mg total) by mouth 2 (two) times daily. 10/13/14  Yes Forest Gleason, MD  fluticasone (FLONASE) 50 MCG/ACT nasal spray Place into the nose. 04/26/14  Yes Historical Provider, MD  gabapentin (NEURONTIN) 600 MG tablet Take 300 mg by mouth. 03/02/12  Yes Historical Provider, MD  HYDROcodone-acetaminophen (NORCO/VICODIN) 5-325 MG per tablet Take by mouth. 06/20/14  Yes Historical Provider, MD   metoprolol succinate (TOPROL-XL) 50 MG 24 hr tablet Take 50 mg by mouth. 04/12/14 04/12/15 Yes Historical Provider, MD  ondansetron (ZOFRAN) 4 MG tablet TAKE (1) TABLET BY MOUTH EVERY 8 HOURS AS NEEDED FOR NAUSEA/VOMITING 07/10/14  Yes Forest Gleason, MD  oxybutynin (DITROPAN) 5 MG tablet Take 5 mg by mouth.   Yes Historical Provider, MD  pantoprazole (PROTONIX) 40 MG tablet Take 40 mg by mouth. 10/18/11  Yes Historical Provider, MD  phenazopyridine (PYRIDIUM) 100 MG tablet Take 1 tablet (100 mg total) by mouth 3 (three) times daily with meals. 08/18/14  Yes Forest Gleason, MD  acetaminophen (TYLENOL) 500 MG tablet Take 1 tablet (500 mg total) by mouth every 6 (six) hours as needed. 10/16/14   Olen Cordial, NP  cephALEXin (KEFLEX) 500 MG capsule Take 1 capsule (500 mg total) by mouth 2 (two) times daily. 10/16/14 10/23/14  Olen Cordial, NP  lisinopril (PRINIVIL,ZESTRIL) 5 MG tablet  03/26/14   Historical Provider, MD   Meds Ordered and Administered this Visit  Medications - No data to display  BP 121/65 mmHg  Pulse 65  Temp(Src) 98 F (36.7 C) (Oral)  Ht 5' (1.524 m)  Wt 139 lb (63.05 kg)  BMI 27.15 kg/m2  SpO2 98% No data found.   Physical Exam  Constitutional: She is oriented to person, place, and time. Vital signs are normal. She appears well-developed and well-nourished. No distress.  HENT:  Head: Normocephalic and atraumatic.  Right Ear: Hearing, tympanic membrane, external ear and ear canal normal.  Left Ear: Hearing, tympanic membrane, external ear and ear canal normal.  Nose: Nose normal. No mucosal edema or rhinorrhea. No epistaxis.  Mouth/Throat: Uvula is midline, oropharynx is clear and moist and mucous membranes are normal. She does not have dentures. No oral lesions. No trismus in the jaw. Normal dentition. No dental abscesses, uvula swelling, lacerations or dental caries. No oropharyngeal exudate.  Eyes: Conjunctivae, EOM and lids are normal. Pupils are equal, round, and  reactive to light. Right eye exhibits no discharge. Left eye exhibits no discharge. No scleral icterus.  Neck: Trachea normal and normal range of motion. Neck supple. No tracheal tenderness, no spinous process tenderness and no muscular tenderness present. No rigidity. No tracheal deviation, no edema, no erythema and normal range of motion present. No thyroid mass and no thyromegaly present.  Cardiovascular: Normal rate, regular rhythm, normal heart sounds and intact distal pulses.  Exam reveals no gallop and no friction rub.   No murmur heard. Pulses:      Popliteal pulses are 2+ on the right side, and 2+ on the left side.       Dorsalis pedis pulses are 2+ on the right side, and 2+ on the left side.  Pulmonary/Chest: Effort normal and breath sounds normal. No accessory muscle usage or stridor. No respiratory distress. She has no decreased breath sounds. She has no wheezes. She has no rhonchi. She has no rales. She exhibits no tenderness.  Abdominal: Soft. Bowel sounds are normal. She exhibits no distension. There is no tenderness. There is no guarding.  Musculoskeletal: She exhibits edema and tenderness.       Right elbow: Normal.      Left elbow: Normal.       Right hip: Normal.       Left hip: Normal.       Right knee:  She exhibits decreased range of motion, swelling, effusion, ecchymosis, erythema and bony tenderness. She exhibits no deformity, no laceration, normal alignment, no LCL laxity, normal patellar mobility, normal meniscus and no MCL laxity. Tenderness found. Medial joint line, lateral joint line and patellar tendon tenderness noted. No MCL and no LCL tenderness noted.       Left knee: Normal.       Right ankle: She exhibits decreased range of motion, swelling and ecchymosis. She exhibits no deformity, no laceration and normal pulse. Tenderness. Lateral malleolus and proximal fibula tenderness found. Achilles tendon normal. Achilles tendon exhibits no pain and no defect.       Left  ankle: She exhibits swelling. She exhibits normal range of motion, no ecchymosis, no deformity, no laceration and normal pulse. No tenderness. Achilles tendon normal. Achilles tendon exhibits no pain and no defect.       Lumbar back: Normal.       Right hand: Normal.       Left hand: Normal.       Right upper leg: Normal.       Left upper leg: She exhibits tenderness and swelling. She exhibits no bony tenderness, no edema, no deformity and no laceration.       Right lower leg: She exhibits tenderness, swelling and edema. She exhibits no bony tenderness, no deformity and no laceration.       Left lower leg: She exhibits swelling and edema. She exhibits no tenderness, no bony tenderness, no deformity and no laceration.       Legs:      Right foot: There is swelling. There is normal range of motion, no tenderness, no bony tenderness, normal capillary refill, no crepitus, no deformity and no laceration.       Left foot: There is tenderness and swelling. There is normal range of motion, no bony tenderness, normal capillary refill, no crepitus, no deformity and no laceration.  Full arom gait slow in hallway to/from bathroom/radiology; patella TTP bony and prepatellar soft tissue right; anterior shin superior to malleolus with macular erythema TTP  Lymphadenopathy:    She has no cervical adenopathy.  Neurological: She is alert and oriented to person, place, and time. She exhibits normal muscle tone. Coordination normal.  Skin: Skin is warm, dry and intact. Rash noted. She is not diaphoretic. There is erythema.  Psychiatric: She has a normal mood and affect. Her speech is normal and behavior is normal. Judgment and thought content normal. Cognition and memory are normal.  Nursing note and vitals reviewed.   ED Course  Procedures (including critical care time)  Labs Review Labs Reviewed - No data to display  Imaging Review Dg Ankle Complete Right  10/16/2014   CLINICAL DATA:  Fall 8-9 days ago  because of leg numbness. Right ankle, foot, and lower leg swelling. Right lateral ankle pain. Initial encounter.  EXAM: RIGHT ANKLE - COMPLETE 3+ VIEW  COMPARISON:  None.  FINDINGS: There is prominent diffuse soft tissue edema throughout the visualized lower leg and circumferentially about the ankle. No acute fracture or dislocation is identified. Soft tissue swelling extends into the foot. Small, scattered soft tissue calcifications are present in the anterior lower leg, nonspecific.  IMPRESSION: Diffuse soft tissue swelling without acute osseous abnormality identified.   Electronically Signed   By: Logan Bores M.D.   On: 10/16/2014 17:26   Dg Knee Complete 4 Views Right  10/16/2014   CLINICAL DATA:  Fall 1 week ago with persistent knee pain  and swelling, initial encounter  EXAM: RIGHT KNEE - COMPLETE 4+ VIEW  COMPARISON:  None.  FINDINGS: No acute fracture or dislocation is noted. Degenerative changes are noted in the patellofemoral space laterally. There is thickening of the infrapatellar ligament and soft tissue swelling which this suggests possible ligamentous injury. No other focal abnormality is noted.  IMPRESSION: Thickening of the infrapatellar ligament which may be related to some ligamentous injury. No other focal acute abnormality is seen.  Degenerative changes in the lateral patellofemoral space.   Electronically Signed   By: Inez Catalina M.D.   On: 10/16/2014 17:20   Dg Foot Complete Right  10/16/2014   CLINICAL DATA:  Recent fall with lateral right foot pain, initial encounter  EXAM: RIGHT FOOT COMPLETE - 3+ VIEW  COMPARISON:  None.  FINDINGS: Considerable soft tissue swelling is noted in the metatarsal region although no acute fracture or dislocation is seen.  IMPRESSION: Generalized soft tissue swelling over the metatarsals without acute bony abnormality.   Electronically Signed   By: Inez Catalina M.D.   On: 10/16/2014 17:36    1800 Discussed xray results with patient and given copy of  radiology report and images on disk given to patient as she is to follow up with [facility] consider orthopedics evaluation if no improvement in swelling over the next two weeks. Consider reimaging at that time.   Patient and spouse verbalized understanding of information/instructions, agreed with plan of care and had no further questions at this time.  MDM   1. Contusion   2. Ankle sprain, right, initial encounter   3. Knee sprain and strain, right, initial encounter   4. Foot sprain, right, initial encounter   5. Toe sprain, initial encounter   6. Cellulitis of right lower extremity    Patient was instructed to rest, ice and elevate the ankle as much as possible.  Activity as tolerated and work on ROM exercises.  Patient is to take tylenol 500mg  po QID prn.  Discussed at risk to reinjure ankle over the next year and to wear supportive footwear/ankle sleeve/ace bandage.  Wear ted hose.  Elevate legs when sitting.  Calf pumps if prolonged sitting/exercises and gentle range of motion exercises.  exitcare handouts on knee sprain, ankle sprain, toe and foot sprain with rehab exercises.  Discussed patient has bone and soft tissue contusions healing.  Follow up with PCM if symptoms persist greater than 4 weeks for re-evaluation. Spouse and  Patient verbalized agreement and understanding of treatment plan.   P2:  Injury Prevention and Fitness.  Sulfa allergy given Rx for keflex 500mg  po BID x 10 days.  Follow up if worsening erythema spreading up thigh from knee.  Redness should be resolving after 48 hours on antibiotics.  Exitcare handout on skin infection given to patient.  No identified abrasion suspect mild abrasion that has healed was port of entry staph infection right leg s/p trauma/fall.  RTC if worsening erythema, pain, purulent discharge, fever.  Wash towels, washcloths, sheets in hot water with bleach every couple of days until infection resolved.  Patient and spouse verbalized understanding, agreed with  plan of care and had no further questions at this time.    Olen Cordial, NP 10/18/14 1138

## 2014-10-20 ENCOUNTER — Other Ambulatory Visit: Payer: Self-pay | Admitting: Internal Medicine

## 2014-10-20 ENCOUNTER — Ambulatory Visit
Admission: RE | Admit: 2014-10-20 | Discharge: 2014-10-20 | Disposition: A | Discharge status: Home / Self Care | Payer: Medicare Other | Source: Ambulatory Visit | Attending: Internal Medicine | Admitting: Internal Medicine

## 2014-10-20 DIAGNOSIS — R600 Localized edema: Secondary | ICD-10-CM | POA: Diagnosis not present

## 2014-10-27 DIAGNOSIS — I340 Nonrheumatic mitral (valve) insufficiency: Secondary | ICD-10-CM | POA: Insufficient documentation

## 2014-10-27 HISTORY — DX: Nonrheumatic mitral (valve) insufficiency: I34.0

## 2014-11-07 ENCOUNTER — Encounter: Payer: Self-pay | Admitting: *Deleted

## 2014-11-07 ENCOUNTER — Ambulatory Visit (INDEPENDENT_AMBULATORY_CARE_PROVIDER_SITE_OTHER): Payer: Medicare Other | Admitting: Obstetrics and Gynecology

## 2014-11-07 ENCOUNTER — Encounter: Payer: Self-pay | Admitting: Obstetrics and Gynecology

## 2014-11-07 VITALS — BP 117/76 | HR 64 | Resp 16 | Ht 68.0 in | Wt 135.6 lb

## 2014-11-07 DIAGNOSIS — N390 Urinary tract infection, site not specified: Secondary | ICD-10-CM | POA: Diagnosis not present

## 2014-11-07 DIAGNOSIS — N8111 Cystocele, midline: Secondary | ICD-10-CM | POA: Diagnosis not present

## 2014-11-07 DIAGNOSIS — N3941 Urge incontinence: Secondary | ICD-10-CM

## 2014-11-07 DIAGNOSIS — K900 Celiac disease: Secondary | ICD-10-CM | POA: Insufficient documentation

## 2014-11-07 LAB — BLADDER SCAN AMB NON-IMAGING

## 2014-11-07 LAB — URINALYSIS, COMPLETE
BILIRUBIN UA: NEGATIVE
Glucose, UA: NEGATIVE
Ketones, UA: NEGATIVE
Nitrite, UA: POSITIVE — AB
PH UA: 7.5 (ref 5.0–7.5)
PROTEIN UA: NEGATIVE
SPEC GRAV UA: 1.015 (ref 1.005–1.030)
Urobilinogen, Ur: 0.2 mg/dL (ref 0.2–1.0)

## 2014-11-07 LAB — MICROSCOPIC EXAMINATION
RBC, UA: NONE SEEN /hpf (ref 0–?)
WBC, UA: >30 /hpf — ABNORMAL HIGH (ref 0–?)

## 2014-11-07 NOTE — Progress Notes (Signed)
11/07/2014 8:05 AM   Nicole Clayton [birth year] [id number]  Referring provider: Ezequiel Kayser, MD [place] [place], [place] [address]  Chief Complaint  Patient presents with  . Urinary Incontinence  . Establish Care    HPI: Patient is an 81 year old female with a history of metastatic ovarian cancer, renal insufficiency, and HTN presenting today to establish care. She has previously received management of her urinary issues at [facility] urology.  Bladder wall thickening was noted on a recent CT scan raising concern for possible in of metastatic ovarian cancer. Deep bladder biopsies obtained by Dr. Edrick Oh 08/12/14, with no evidence of malignancy. CT not available for review.  Urinary complaints include recurrent urinary tract infections, urgency, frequency, urgency incontinence and enuresis.  Bladder surgery in [place] >10 years ago    Cystoscopy Note: 05/20/14 Edrick Oh, MD The 17-French flexible cystoscope was inserted without difficulty. The urethra was inspected in its entirety. This demonstrated normal mucosa with no significant abnormalities. The external sphincter demonstrated normal tone. The bladder mucosa was inspected in its entirety. The mucosa demonstrated prominent areas of the regular mucosa in nodular pattern was noted over much of the bladder base and lateral walls as well as posterior bladder wall. A portion of the anterior bladder wall up to the dome demonstrates relatively normal mucosa. As the bladder was filled, there was not a uniform shape to the bladder. This indicates external compression from another source. This is all worrisome for ingrowth of ovarian cancer into the perivesical space and detrusor muscle. The ureteral orifice(s) demonstrated no significant abnormalities bilaterally, normal position. Retroflex view demonstrated no significant abnormalities. Vaginal examination demonstrated: No significant vaginal abnormalities were noted were noted.    PMH: Past Medical History  Diagnosis Date  . Cancer (HCC)     Ovarian   . MI (mitral incompetence) 10/27/2014    Overview:  MODERATE   . Bladder infection, chronic 10/19/2011  . 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 Claiborne Rigg and Delorise Shiner Choksi Overview:  Drs. Claiborne Rigg and Forest Gleason     Surgical History: Past Surgical History  Procedure Laterality Date  . Abdominal hysterectomy    . Laparoscopic bilateral salpingo oopherectomy  2011    Home Medications:    Medication List       This list is accurate as of: 11/07/14 11:59 PM.  Always use your most recent med list.               acetaminophen 500 MG tablet  Commonly known as:  TYLENOL  Take 1 tablet (500 mg total) by mouth every 6 (six) hours as needed.     aspirin EC 81 MG tablet  Take 81 mg by mouth.     cetirizine 10 MG tablet  Commonly known as:  ZYRTEC  Take by mouth.     docusate sodium 100 MG capsule  Commonly known as:  COLACE  Take 100 mg by mouth.     fluticasone 50 MCG/ACT nasal spray  Commonly known as:  FLONASE  Place into the nose.     gabapentin 600 MG tablet  Commonly known as:  NEURONTIN  Take 300 mg by mouth.     HYDROcodone-acetaminophen 5-325 MG tablet  Commonly known as:  NORCO/VICODIN  Take by mouth.     loratadine 10 MG tablet  Commonly known as:  CLARITIN  Take by mouth.     metoprolol succinate 50 MG 24  hr tablet  Commonly known as:  TOPROL-XL  Take 50 mg by mouth.     nitrofurantoin (macrocrystal-monohydrate) 100 MG capsule  Commonly known as:  MACROBID     ondansetron 4 MG tablet  Commonly known as:  ZOFRAN  TAKE (1) TABLET BY MOUTH EVERY 8 HOURS AS NEEDED FOR NAUSEA/VOMITING     oxybutynin 5 MG tablet  Commonly known as:  DITROPAN  Take 5 mg by mouth.     pantoprazole 40 MG tablet  Commonly known as:  PROTONIX  Take 40 mg by mouth.     phenazopyridine 100 MG tablet  Commonly known as:  PYRIDIUM  Take 1  tablet (100 mg total) by mouth 3 (three) times daily with meals.     Vitamin D3 2000 UNITS capsule  Take by mouth.        Allergies:  Allergies  Allergen Reactions  . Alendronate Sodium     Other reaction(s): Other (See Comments) Dysphagia  . Duloxetine Hcl Diarrhea  . Risedronate Sodium Rash    Aching, dysphagia  . Nitrofurantoin Other (See Comments)  . Lovastatin     Other reaction(s): Other (See Comments) GI upset  . Sulfa Antibiotics Rash    Family History: Family History  Problem Relation Age of Onset  . Family history unknown: Yes    Social History:  reports that she has never smoked. She does not have any smokeless tobacco history on file. She reports that she does not drink alcohol. Her drug history is not on file.  ROS: UROLOGY Frequent Urination?: Yes Hard to postpone urination?: Yes Burning/pain with urination?: Yes Get up at night to urinate?: Yes Leakage of urine?: Yes Urine stream starts and stops?: No Trouble starting stream?: Yes Do you have to strain to urinate?: Yes Blood in urine?: No Urinary tract infection?: Yes Sexually transmitted disease?: No Injury to kidneys or bladder?: No Painful intercourse?: No Weak stream?: No Currently pregnant?: Yes Vaginal bleeding?: No Last menstrual period?: n  Gastrointestinal Nausea?: Yes Vomiting?: No Indigestion/heartburn?: Yes Diarrhea?: No Constipation?: No  Constitutional Fever: No Night sweats?: No Weight loss?: No Fatigue?: No  Skin Skin rash/lesions?: No Itching?: No  Eyes Blurred vision?: No Double vision?: No  Ears/Nose/Throat Sore throat?: No Sinus problems?: Yes  Hematologic/Lymphatic Swollen glands?: No Easy bruising?: No  Cardiovascular Leg swelling?: Yes Chest pain?: No  Respiratory Cough?: Yes Shortness of breath?: No  Endocrine Excessive thirst?: Yes  Musculoskeletal Back pain?: No Joint pain?: Yes  Neurological Headaches?: No Dizziness?:  No  Psychologic Depression?: No Anxiety?: No  Physical Exam: BP 117/76 mmHg  Pulse 64  Resp 16  Ht 5\' 8"  (1.727 m)  Wt 135 lb 9.6 oz (61.508 kg)  BMI 20.62 kg/m2  Constitutional:  Alert and oriented, No acute distress. HEENT: [place] AT, moist mucus membranes.  Trachea midline, no masses. Cardiovascular: No clubbing, cyanosis, or edema. Respiratory: Normal respiratory effort, no increased work of breathing. GI: Abdomen is soft, nontender, nondistended, no abdominal masses GU: No CVA tenderness.  Pelvic:  Vaginal atrophy with Grade 2 cystocele, urethral caruncle present Skin: No rashes, bruises or suspicious lesions. Lymph: No cervical or inguinal adenopathy. Neurologic: Grossly intact, no focal deficits, moving all 4 extremities. Psychiatric: Normal mood and affect.  Laboratory Data: Results for orders placed or performed in visit on 11/07/14  Microscopic Examination  Result Value Ref Range   WBC, UA >30 (H) 0 -  5 /hpf   RBC, UA None seen 0 -  2 /hpf  Epithelial Cells (non renal) 0-10 0 - 10 /hpf   Mucus, UA Present (A) Not Estab.   Bacteria, UA Many (A) None seen/Few  Urinalysis, Complete  Result Value Ref Range   Specific Gravity, UA 1.015 1.005 - 1.030   pH, UA 7.5 5.0 - 7.5   Color, UA Yellow Yellow   Appearance Ur Clear Clear   Leukocytes, UA 3+ (A) Negative   Protein, UA Negative Negative/Trace   Glucose, UA Negative Negative   Ketones, UA Negative Negative   RBC, UA Trace (A) Negative   Bilirubin, UA Negative Negative   Urobilinogen, Ur 0.2 0.2 - 1.0 mg/dL   Nitrite, UA Positive (A) Negative   Microscopic Examination See below:   BLADDER SCAN AMB NON-IMAGING  Result Value Ref Range   Scan Result 377ml      Lab Results  Component Value Date   CREATININE 1.28* 10/13/2014    No results found for: PSA  No results found for: TESTOSTERONE  No results found for: HGBA1C  Urinalysis    Component Value Date/Time   COLORURINE Yellow 04/28/2014 0958    APPEARANCEUR Cloudy 04/28/2014 0958   LABSPEC 1.016 04/28/2014 0958   PHURINE 6.0 04/28/2014 0958   GLUCOSEU Negative 11/07/2014 1344   GLUCOSEU Negative 04/28/2014 0958   HGBUR 1+ 04/28/2014 0958   BILIRUBINUR Negative 11/07/2014 1344   BILIRUBINUR Negative 04/28/2014 0958   KETONESUR Negative 04/28/2014 0958   PROTEINUR >=500 04/28/2014 0958   NITRITE Positive* 11/07/2014 1344   NITRITE Negative 04/28/2014 0958   LEUKOCYTESUR 3+* 11/07/2014 1344   LEUKOCYTESUR 3+ 04/28/2014 0958    Pertinent Imaging:  Assessment & Plan:    1. Urge incontinence- PVR 329mL. Mild urge incontinence, most likely overflow related to incomplete bladder emptying. - Urinalysis, Complete - BLADDER SCAN AMB NON-IMAGING  2. Recurrent UTI- History of pan resistant UTIs in the past. Cath specimen obtained today and sent for culture.  May need a consult with ID in the future pending culture results.  We will recheck after incomplete emptying addressed. She may also benefit from suppressive antibiotic therapy.   3. Cystocele- History of cystocele repair >10years ago. Grade 2 with Valsalva. Referral to Gyn for pessary evaluation.  4. Incomplete bladder emptying- Most likely due to cystocele vs complications related to external compression from another source as suspected on recent cystoscopy.  Will recheck PVR at f/u after patient has seen Gyn. Referral placed.  4. H/O ovarian cancer- [facility] by [facility].  5. Vaginal Atrophy-  Not a candidate for HRT with history of ovarian cancer.  Will refer to Dr. Ola Spurr pending urine culture .   Return in about 4 months (around 03/10/2015) for recheck recurrent UTI and incomplete bladder emptying.  Herbert Moors, [facility] [address], [place], [place] [address] [phone]

## 2014-11-09 LAB — CULTURE, URINE COMPREHENSIVE

## 2014-11-11 ENCOUNTER — Telehealth: Payer: Self-pay

## 2014-11-11 DIAGNOSIS — N390 Urinary tract infection, site not specified: Secondary | ICD-10-CM

## 2014-11-11 MED ORDER — AMOXICILLIN-POT CLAVULANATE 875-125 MG PO TABS: 1.0000 | ORAL_TABLET | Freq: Two times a day (BID) | ORAL | Status: AC

## 2014-11-11 NOTE — Telephone Encounter (Signed)
-----   Message from Roda Shutters, [place] sent at 11/10/2014  3:37 PM EDT ----- Please notify patient that her urine culture was positive for infection. Has however it is susceptible to most antibiotics. Please send in a prescription for Augmentin 875 twice daily 1 week. We will hold off on her referral to infectious disease. thanks

## 2014-11-11 NOTE — Telephone Encounter (Signed)
Spoke with pt in reference to +ucx. Pt was concerned about rx abx. Reinforced with pt the medication was not a medication in her allergy list or related to one. Reinforced with pt if she develops any reactions to stop the medication STAT and give [place] a call or if the reaction is so severe she develops breathing problems to go straight to the ER. Pt voiced understanding.

## 2014-11-24 ENCOUNTER — Inpatient Hospital Stay: Payer: Medicare Other | Attending: Oncology

## 2014-11-24 ENCOUNTER — Inpatient Hospital Stay (HOSPITAL_BASED_OUTPATIENT_CLINIC_OR_DEPARTMENT_OTHER): Payer: Medicare Other | Admitting: Oncology

## 2014-11-24 VITALS — BP 134/79 | HR 68 | Temp 95.6°F | Wt 135.2 lb

## 2014-11-24 DIAGNOSIS — Z923 Personal history of irradiation: Secondary | ICD-10-CM | POA: Diagnosis not present

## 2014-11-24 DIAGNOSIS — C774 Secondary and unspecified malignant neoplasm of inguinal and lower limb lymph nodes: Secondary | ICD-10-CM

## 2014-11-24 DIAGNOSIS — Z9221 Personal history of antineoplastic chemotherapy: Secondary | ICD-10-CM | POA: Insufficient documentation

## 2014-11-24 DIAGNOSIS — Z86718 Personal history of other venous thrombosis and embolism: Secondary | ICD-10-CM | POA: Diagnosis not present

## 2014-11-24 DIAGNOSIS — M7989 Other specified soft tissue disorders: Secondary | ICD-10-CM

## 2014-11-24 DIAGNOSIS — Z8543 Personal history of malignant neoplasm of ovary: Secondary | ICD-10-CM

## 2014-11-24 DIAGNOSIS — M1990 Unspecified osteoarthritis, unspecified site: Secondary | ICD-10-CM | POA: Diagnosis not present

## 2014-11-24 DIAGNOSIS — Z8673 Personal history of transient ischemic attack (TIA), and cerebral infarction without residual deficits: Secondary | ICD-10-CM

## 2014-11-24 DIAGNOSIS — R32 Unspecified urinary incontinence: Secondary | ICD-10-CM

## 2014-11-24 DIAGNOSIS — C5700 Malignant neoplasm of unspecified fallopian tube: Secondary | ICD-10-CM

## 2014-11-24 DIAGNOSIS — I10 Essential (primary) hypertension: Secondary | ICD-10-CM | POA: Insufficient documentation

## 2014-11-24 LAB — CBC WITH DIFFERENTIAL/PLATELET
Basophils Absolute: 0 10*3/uL (ref 0–0.1)
Basophils Relative: 1 %
EOS ABS: 0 10*3/uL (ref 0–0.7)
EOS PCT: 1 %
HCT: 34.4 % — ABNORMAL LOW (ref 35.0–47.0)
Hemoglobin: 11.2 g/dL — ABNORMAL LOW (ref 12.0–16.0)
LYMPHS ABS: 1.6 10*3/uL (ref 1.0–3.6)
Lymphocytes Relative: 40 %
MCH: 28.8 pg (ref 26.0–34.0)
MCHC: 32.6 g/dL (ref 32.0–36.0)
MCV: 88.5 fL (ref 80.0–100.0)
MONOS PCT: 12 %
Monocytes Absolute: 0.5 10*3/uL (ref 0.2–0.9)
Neutro Abs: 1.9 10*3/uL (ref 1.4–6.5)
Neutrophils Relative %: 46 %
PLATELETS: 409 10*3/uL (ref 150–440)
RBC: 3.89 MIL/uL (ref 3.80–5.20)
RDW: 15.3 % — ABNORMAL HIGH (ref 11.5–14.5)
WBC: 4.1 10*3/uL (ref 3.6–11.0)

## 2014-11-24 LAB — COMPREHENSIVE METABOLIC PANEL
ALT: 11 U/L — ABNORMAL LOW (ref 14–54)
ANION GAP: 7 (ref 5–15)
AST: 26 U/L (ref 15–41)
Albumin: 3.8 g/dL (ref 3.5–5.0)
Alkaline Phosphatase: 60 U/L (ref 38–126)
BUN: 20 mg/dL (ref 6–20)
CHLORIDE: 105 mmol/L (ref 101–111)
CO2: 28 mmol/L (ref 22–32)
Calcium: 8.5 mg/dL — ABNORMAL LOW (ref 8.9–10.3)
Creatinine, Ser: 0.96 mg/dL (ref 0.44–1.00)
GFR calc non Af Amer: 53 mL/min — ABNORMAL LOW (ref >60)
Glucose, Bld: 81 mg/dL (ref 65–99)
POTASSIUM: 4.2 mmol/L (ref 3.5–5.1)
SODIUM: 140 mmol/L (ref 135–145)
Total Bilirubin: 0.5 mg/dL (ref 0.3–1.2)
Total Protein: 7.3 g/dL (ref 6.5–8.1)

## 2014-11-25 LAB — CA 125: CA 125: 26.2 U/mL (ref 0.0–38.1)

## 2014-11-26 ENCOUNTER — Encounter: Payer: Self-pay | Admitting: Oncology

## 2014-11-26 NOTE — Progress Notes (Signed)
[place] @ [facility] [phone]  Fax:(336) [place] OB: [birth year]  MR#: [id number]  TZG#:[id number]  Patient Care Team: Ezequiel Kayser, [name]  CHIEF COMPLAINT:  Chief Complaint  Patient presents with  . OTHER   Oncology History   . 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  Adequate TRS, no macroscopic residual. IP/IV chemotherapy with DDP and paclitaxel on GOG protocol, chemo and Bev consolidation completed in 03/2011 2. 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 November 14, 2011- Patient was started on carboplatin and gemcitabine 4. Finished 6 cycles of chemotherapy in April of 2014 with carboplatin and gemcitabine. Tolerance was fairly good except for neutropenia and thrombocytopenia. 5.recurrent disease by CT scan February of 2015 6.radiation therapy to pelvis  May of 2015 7.upper extremity  . and deep vein thrombosis associated with port.(June of 2015) patient started on   [place] had port removed and had   thrombectomy September 06, 2013. 9.progressing disease in the right inguinal area 10.  Patient was started on Taxol and Avastin on a weekly basis 11.  Patient is   On chemotherapy holidays because of persistent urinary problem   12Recurrent carcinoma of fallopian tube.  Presently on observation        INTERVAL HISTORY:  81 year old lady with recurrent ovarian cancer treated with chemotherapy and radiation therapy to the right inguinal area.  Complains of swelling of the right lower extremity where patient had a significant injury.  Tenderness present.  No nausea.  No vomiting.  Patient continues to have problem with bladder being evaluated by Dr. Richrd Humbles, gynecologist.  No chills.  No fever.  No abdominal pain. REVIEW OF SYSTEMS:   GENERAL:  Feels good.  Active.  No fevers, sweats or weight loss. Recent had a recent fall    PERFORMANCE STATUS (ECOG):01 HEENT:  No visual changes, runny nose, sore throat, mouth sores or tenderness. Lungs: No shortness of breath or cough.  No hemoptysis. Cardiac:  No chest pain, palpitations, orthopnea, or PND. GI:  No nausea, vomiting, diarrhea, constipation, melena or hematochezia. GU:  Continues to have incontinence of urine.  Intermittent burning.  Recently had cystoscopy which revealed no evidence of malignancy.  Musculoskeletal:  No back pain.  No joint pain.  No muscle tenderness.Swelling of the right lower extremity ecchymosis.  To follow  Extremities:  No pain or swelling. Skin:  No rashes or skin changes. Neuro:  No headache, numbness or weakness, balance or coordination issues. Endocrine:  No diabetes, thyroid issues, hot flashes or night sweats. Psych:  No mood changes, depression or anxiety. Pain:  No focal pain. Review of systems:  All other systems reviewed and found to be negative. As per HPI. Otherwise, a complete review of systems is negatve.    Smoking History: Smoking History Never Smoked.  PFSH: Family History: noncontributory  Comments: no maligancies  Social History: negative alcohol, negative tobacco  Additional Past Medical and Surgical History: Med:   HTN for 9 years             celiac disease, not active,              seasonal allergies              arthritis of the small joints,  TIA 20 years ago no further symptoms.    Mitral wall prolapse without any significant cardiac arrhythmias or  congestive heart failure.    Surg:     1963  hysterectomy for descensus               1996  cholecystectomy through the scope,               2000  sling procedure for incontinence (Dr. Kathyrn Lass)   [place]:  Patient does have advance healthcare directive, Patient   does not desire to make any changes HEALTH MAINTENANCE: Social History  Substance Use Topics  . Smoking status: Never Smoker   . Smokeless tobacco: None  . Alcohol Use: No       Allergies  Allergen Reactions  . Alendronate Sodium     Other reaction(s): Other (See Comments) Dysphagia  . Duloxetine Hcl Diarrhea  . Risedronate Sodium Rash    Aching, dysphagia  . Nitrofurantoin Other (See Comments)  . Lovastatin     Other reaction(s): Other (See Comments) GI upset  . Sulfa Antibiotics Rash     OBJECTIVE:  Filed Vitals:   11/24/14 1104  BP: 134/79  Pulse: 68  Temp: 95.6 F (35.3 C)     Body mass index is 20.57 kg/(m^2).    ECOG FS:1 - Symptomatic but completely ambulatory  PHYSICAL EXAM: General  status: Performance status is good.  Patient has not lost significant weight HEENT: No evidence of stomatitis. Sclera and conjunctivae :: No jaundice.   pale looking. Lungs: Air  entry equal on both sides.  No rhonchi.  No rales.  Cardiac: Heart sounds are normal.  No pericardial rub.  No murmur. Lymphatic system: Cervical, axillary, inguinal, lymph nodes not palpable GI: Abdomen is soft.  No ascites.  Liver spleen not palpable.  No tenderness.  Bowel sounds are within normal limit Lower extremity: Right lower extremity ecchymosis swelling.  Is improved but tenderness persist Neurological system: Higher functions, cranial nerves intact no evidence of peripheral neuropathy. Skin: No rash.  No ecchymosis.Marland Kitchen\   LAB RESULTS:  Appointment on 11/24/2014  Component Date Value Ref Range Status  . WBC 11/24/2014 4.1  3.6 - 11.0 K/uL Final  . RBC 11/24/2014 3.89  3.80 - 5.20 MIL/uL Final  . Hemoglobin 11/24/2014 11.2* 12.0 - 16.0 g/dL Final  . HCT 11/24/2014 34.4* 35.0 - 47.0 % Final  . MCV 11/24/2014 88.5  80.0 - 100.0 fL Final  . MCH 11/24/2014 28.8  26.0 - 34.0 pg Final  . MCHC 11/24/2014 32.6  32.0 - 36.0 g/dL Final  . RDW 11/24/2014 15.3* 11.5 - 14.5 % Final  . Platelets 11/24/2014 409  150 - 440 K/uL Final  . Neutrophils Relative % 11/24/2014 46   Final  . Neutro Abs 11/24/2014 1.9  1.4 - 6.5 K/uL Final  . Lymphocytes Relative 11/24/2014 40   Final   . Lymphs Abs 11/24/2014 1.6  1.0 - 3.6 K/uL Final  . Monocytes Relative 11/24/2014 12   Final  . Monocytes Absolute 11/24/2014 0.5  0.2 - 0.9 K/uL Final  . Eosinophils Relative 11/24/2014 1   Final  . Eosinophils Absolute 11/24/2014 0.0  0 - 0.7 K/uL Final  . Basophils Relative 11/24/2014 1   Final  . Basophils Absolute 11/24/2014 0.0  0 - 0.1 K/uL Final  . Sodium 11/24/2014 140  135 - 145 mmol/L Final  . Potassium 11/24/2014 4.2  3.5 - 5.1 mmol/L Final  . Chloride 11/24/2014 105  101 - 111 mmol/L Final  . CO2 11/24/2014 28  22 - 32 mmol/L Final  . Glucose, Bld 11/24/2014  81  65 - 99 mg/dL Final  . BUN 11/24/2014 20  6 - 20 mg/dL Final  . Creatinine, Ser 11/24/2014 0.96  0.44 - 1.00 mg/dL Final  . Calcium 11/24/2014 8.5* 8.9 - 10.3 mg/dL Final  . Total Protein 11/24/2014 7.3  6.5 - 8.1 g/dL Final  . Albumin 11/24/2014 3.8  3.5 - 5.0 g/dL Final  . AST 11/24/2014 26  15 - 41 U/L Final  . ALT 11/24/2014 11* 14 - 54 U/L Final  . Alkaline Phosphatase 11/24/2014 60  38 - 126 U/L Final  . Total Bilirubin 11/24/2014 0.5  0.3 - 1.2 mg/dL Final  . GFR calc non Af Amer 11/24/2014 53* >60 mL/min Final  . GFR calc Af Amer 11/24/2014 >60  >60 mL/min Final   Comment: (NOTE) The eGFR has been calculated using the CKD EPI equation. This calculation has not been validated in all clinical situations. eGFR's persistently <60 mL/min signify possible Chronic Kidney Disease.   . Anion gap 11/24/2014 7  5 - 15 Final  . CA 125 11/24/2014 26.2  0.0 - 38.1 U/mL Final   Comment: (NOTE) Roche ECLIA methodology Performed At: [facility] [place], [place] [id number] Lindon Romp MD RA:[id number]    Component     Latest Ref Rng 09/03/2013 11/01/2013 01/11/2014 02/10/2014 02/24/2014  CA 125     0.0 - 38.1 U/mL 37.8 (H) 59.4 (H) 59.6 (H) 32.1 27.9   Component     Latest Ref Rng 05/12/2014 06/02/2014 08/18/2014 10/13/2014  CA 125     0.0 - 38.1 U/mL 30.7 30.3 28.1 21.4   Dx:  Carcinoma  of fallopian tube, unspecif...          Ref Range 2d ago    CA 125 0.0 - 38.1 U/mL 26.2   Comments: (NOTE)              ASSESSMENT: 81 year old lady with recurrent ovarian cancer. Review of tumor markers shows stable disease.  No clinical evidence of recurrent disease.   Continuing bladder problems and had a recent cystoscopy negative for malignancy This and was seen by urologist locally as well as was referred to gynecologist.  Was found to have prolapse of the uterus All lab data in tumor markers have been reviewed There is no evidence of recurrent or progressive disease MEDICAL DECISION MAKING:  There is no evidence of recurrent or progressive ovarian cancer All lab data has been reviewed tumor markers have been reviewed physical examination Anemia has resolved. Tenderness in the right lower extremity is most likely from previous injury   Patient expressed understanding and was in agreement with this plan. She also understands that She can call clinic at any time with any questions, concerns, or complaints.    No matching staging information was found for the patient.  Forest Gleason, MD   11/26/2014 8:15 AM

## 2014-12-15 ENCOUNTER — Ambulatory Visit (INDEPENDENT_AMBULATORY_CARE_PROVIDER_SITE_OTHER): Payer: Medicare Other | Admitting: Obstetrics and Gynecology

## 2014-12-15 ENCOUNTER — Encounter: Payer: Self-pay | Admitting: Obstetrics and Gynecology

## 2014-12-15 VITALS — BP 119/67 | HR 75 | Ht 60.0 in | Wt 136.2 lb

## 2014-12-15 DIAGNOSIS — IMO0002 Reserved for concepts with insufficient information to code with codable children: Principal | ICD-10-CM

## 2014-12-15 DIAGNOSIS — C5700 Malignant neoplasm of unspecified fallopian tube: Secondary | ICD-10-CM | POA: Diagnosis not present

## 2014-12-15 DIAGNOSIS — Z90721 Acquired absence of ovaries, unilateral: Secondary | ICD-10-CM

## 2014-12-15 DIAGNOSIS — Z90710 Acquired absence of both cervix and uterus: Secondary | ICD-10-CM | POA: Diagnosis not present

## 2014-12-15 DIAGNOSIS — N8110 Cystocele, unspecified: Secondary | ICD-10-CM

## 2014-12-15 DIAGNOSIS — N8111 Cystocele, midline: Secondary | ICD-10-CM | POA: Insufficient documentation

## 2014-12-15 DIAGNOSIS — IMO0001 Reserved for inherently not codable concepts without codable children: Secondary | ICD-10-CM

## 2014-12-15 DIAGNOSIS — N952 Postmenopausal atrophic vaginitis: Secondary | ICD-10-CM

## 2014-12-15 DIAGNOSIS — R339 Retention of urine, unspecified: Secondary | ICD-10-CM

## 2014-12-15 NOTE — Progress Notes (Signed)
Patient ID: Nicole Clayton, female   DOB: Jun 29, 1928, 81 y.o.   MRN: [id number] Refer from LO cystocele Recurrent uti  Chief complaint: 1.  Cystocele. 2.  Incomplete bladder emptying. 3.  Recurrent UTIs. 4.  History of fallopian tube cancer. 5.  Status post hysterectomy BSO.Marland Kitchen  Patient is referred from urology for consideration of possible pessary fitting.  Due to above problems.  She has recurrent bladder infections and has been identified as having incomplete bladder emptying associated with a second-degree cystocele.  She is status post bladder repair 2 in the past; last bladder repair was greater than 10 years ago.  Past medical history, past surgical history: Past problem list, medications, and allergies are reviewed.  OBJECTIVE: BP 119/67 mmHg  Pulse 75  Ht 5' (1.524 m)  Wt 136 lb 3.2 oz (61.78 kg)  BMI 26.60 kg/m2 Pleasant elderly female in no acute distress. Abdomen: Soft, nontender, without palpable masses. Pelvic exam: External genitalia-atrophic changes. BUS-urethral caruncle. Vagina-moderate atrophy; second-degree cystocele with good support of the bladder neck; no significant rectocele. Bimanual exam-no palpable adnexal masses. Rectovaginal-normal sphincter tone,; no rectal masses; firm stool in the rectal vault.  PROCEDURE: Pessary fitting. #2 ring with support-Success  ASSESSMENT: 1.  Incomplete bladder emptying. 2.  Second-degree cystocele. 3.  Recurrent UTI. 4.  Moderate vaginal atrophy. 5.  History of fallopian tube cancer   PLAN: 1.  Pessary trial. 2.  Premarin cream 1/2 g intravaginal twice a week. 3.  We will call patient when Pessary arrives for insertion.  A total of 30 minutes were spent face-to-face with the patient during the encounter with greater than 50% dealing with counseling and coordination of care.   Brayton Mars, MD

## 2014-12-15 NOTE — Patient Instructions (Signed)
1.  Premarin cream intravaginal 1/2 g twice a week. 2.  Patient will be notified by phone when pessary arrives, to come in for insertion

## 2014-12-22 ENCOUNTER — Ambulatory Visit (INDEPENDENT_AMBULATORY_CARE_PROVIDER_SITE_OTHER): Payer: Medicare Other | Admitting: Obstetrics and Gynecology

## 2014-12-22 ENCOUNTER — Encounter: Payer: Self-pay | Admitting: Obstetrics and Gynecology

## 2014-12-22 VITALS — BP 115/68 | HR 70 | Ht 60.0 in | Wt 136.5 lb

## 2014-12-22 DIAGNOSIS — C5700 Malignant neoplasm of unspecified fallopian tube: Secondary | ICD-10-CM

## 2014-12-22 DIAGNOSIS — IMO0002 Reserved for concepts with insufficient information to code with codable children: Principal | ICD-10-CM

## 2014-12-22 DIAGNOSIS — N952 Postmenopausal atrophic vaginitis: Secondary | ICD-10-CM | POA: Diagnosis not present

## 2014-12-22 DIAGNOSIS — R339 Retention of urine, unspecified: Secondary | ICD-10-CM | POA: Diagnosis not present

## 2014-12-22 DIAGNOSIS — N8110 Cystocele, unspecified: Secondary | ICD-10-CM | POA: Diagnosis not present

## 2014-12-22 DIAGNOSIS — Z90710 Acquired absence of both cervix and uterus: Secondary | ICD-10-CM | POA: Diagnosis not present

## 2014-12-22 DIAGNOSIS — IMO0001 Reserved for inherently not codable concepts without codable children: Secondary | ICD-10-CM

## 2014-12-22 DIAGNOSIS — Z90721 Acquired absence of ovaries, unilateral: Secondary | ICD-10-CM | POA: Diagnosis not present

## 2014-12-22 NOTE — Progress Notes (Signed)
Chief complaint: 1. Pessary insertion  Patient is an 81 year old white female with history of fallopian tube cancer, status post TAH/BSO, with grade 2 cystocele, incomplete bladder emptying, and history of recurrent UTI. She has been fitted for a pessary for insertion.  OBJECTIVE: BP 115/68 mmHg  Pulse 70  Ht 5' (1.524 m)  Wt 136 lb 8 oz (61.916 kg)  BMI 26.66 kg/m2 Pleasant elderly female in no acute distress Abdomen: Soft, nontender, without palpable masses. Pelvic exam: External genitalia-atrophic changes. BUS-urethral caruncle. Vagina-moderate atrophy; second-degree cystocele with good support of the bladder neck; no significant rectocele. Bimanual exam-no palpable adnexal masses. Rectovaginal-normal sphincter tone,; no rectal masses; firm stool in the rectal vault.  PROCEDURE: Pessary Insertion-#2 ring with support  ASSESSMENT: 1. Incomplete bladder emptying. 2. Second-degree cystocele. 3. Recurrent UTI. 4. Moderate vaginal atrophy. 5. History of fallopian tube cancer  PLAN: 1. #2 ring with support pessary is inserted  2 Premarin cream 1/2 g intravaginally twice a week 3. TRimo San gel ntravaginal weekly 4. Return in 2 weeks for pessary check  A total of 15 minutes were spent face-to-face with the patient during this encounter and over half of that time dealt with counseling and coordination of care.   Brayton Mars, MD  Note: This dictation was prepared with Dragon dictation along with smaller phrase technology. Any transcriptional errors that result from this process are unintentional.

## 2015-01-04 ENCOUNTER — Encounter: Payer: Self-pay | Admitting: Obstetrics and Gynecology

## 2015-01-04 ENCOUNTER — Telehealth: Payer: Self-pay | Admitting: Obstetrics and Gynecology

## 2015-01-04 ENCOUNTER — Ambulatory Visit (INDEPENDENT_AMBULATORY_CARE_PROVIDER_SITE_OTHER): Payer: Medicare Other | Admitting: Obstetrics and Gynecology

## 2015-01-04 VITALS — BP 106/67 | HR 73 | Ht 60.0 in | Wt 137.6 lb

## 2015-01-04 DIAGNOSIS — K529 Noninfective gastroenteritis and colitis, unspecified: Secondary | ICD-10-CM | POA: Diagnosis not present

## 2015-01-04 DIAGNOSIS — Z4689 Encounter for fitting and adjustment of other specified devices: Secondary | ICD-10-CM | POA: Diagnosis not present

## 2015-01-04 DIAGNOSIS — IMO0002 Reserved for concepts with insufficient information to code with codable children: Secondary | ICD-10-CM

## 2015-01-04 DIAGNOSIS — C5700 Malignant neoplasm of unspecified fallopian tube: Secondary | ICD-10-CM | POA: Diagnosis not present

## 2015-01-04 DIAGNOSIS — N8110 Cystocele, unspecified: Secondary | ICD-10-CM | POA: Diagnosis not present

## 2015-01-04 DIAGNOSIS — IMO0001 Reserved for inherently not codable concepts without codable children: Secondary | ICD-10-CM

## 2015-01-04 MED ORDER — NYSTATIN-TRIAMCINOLONE 100000-0.1 UNIT/GM-% EX CREA: 1.0000 "application " | TOPICAL_CREAM | Freq: Two times a day (BID) | CUTANEOUS | Status: DC

## 2015-01-04 MED ORDER — TRIAMCINOLONE ACETONIDE 0.1 % EX OINT: 1.0000 "application " | TOPICAL_OINTMENT | Freq: Two times a day (BID) | CUTANEOUS | Status: DC

## 2015-01-04 MED ORDER — NYSTATIN 100000 UNIT/GM EX OINT: 1.0000 "application " | TOPICAL_OINTMENT | Freq: Two times a day (BID) | CUTANEOUS | Status: DC

## 2015-01-04 NOTE — Progress Notes (Signed)
Patient ID: Nicole Clayton, female   DOB: 1928-03-23, 81 y.o.   MRN: [id number]   c/o of loose stool since insertion  rt sided pelvic pain  At times nauseous  Chief complaint: 1.  2 week pessary check- ring w/support 2.  Loose stool. 3.  Right-sided pelvic pain   Patient is an 81 year old white female with history of fallopian tube cancer, status post TAH/BSO, with grade 2 cystocele, incomplete bladder emptying, and history of recurrent UTI. She has been fitted for a pessary; The ring with support pessary was placed 2 weeks ago and she is here for follow-up with the above complaints.  Chronic diarrhea has been troublesome.  She has significant rectal inflammation.  She also is complaining of enlarging mass in right groin consistent with previously identified enlarged lymph nodes.  The patient has been experiencing some incontinence at night and urgency during the day.  Past medical history, past surgical history, problem list, medications, and allergies are reviewed.  Review of systems: Per HPI.  OBJECTIVE: BP 106/67 mmHg  Pulse 73  Ht 5' (1.524 m)  Wt 137 lb 9.6 oz (62.415 kg)  BMI 26.87 kg/m2 Pleasant elderly, frail white female in no acute distress.  She is alert and oriented. Abdomen is soft and nontender without organomegaly. Pelvic exam: Inguinal region: Right side Notable for a matted grouping of lymph nodes measuring 4 x 6 cm, 2/4 tender; left side Normal External genitalia-normal BUS-normal; urethral caruncle present. Vagina-introitus is normal with the ring pessary being identified in appropriate location; no significant discharge. Rectovaginal-moderate hyperemia with satellite lesions in a symmetric pattern around the anus; diarrhea stool likewise soiling the perianal region.  ASSESSMENT: 1. 1.  Right inguinal lymph node group enlargement, 4 x 6 cm, 2/4 tender. 2.  History of fallopian tube cancer without recent chemotherapy. 3.  New onset diarrhea with chronic perianal  inflammation. 4.  Cystocele, second-degree, status post insertion of ring with support pessary.  PLAN: 1.  Nystatin triamcinolone C, denies cream to be applied topically twice a day for working days. 2.  Kaopectate may be utilized for loose stools. 3.  Obtain a peri-bottle to help clean her perineum.  Following bowel movements. 4.  Recommend hairdryer blow drying perineum after Cleansing following bowel movement. 5.  Apply Desitin salve to perianal region to protect skin from irritation from diarrhea. 6.  Keep appointment with Dr. Dwaine Deter as scheduled tomorrow. 7.  Return in 2 weeks for pessary check.  A total of 25 minutes were spent face-to-face with the patient during this encounter and over half of that time involved counseling and coordination of care.  Brayton Mars, MD  Note: This dictation was prepared with Dragon dictation along with smaller phrase technology. Any transcriptional errors that result from this process are unintentional.

## 2015-01-04 NOTE — Patient Instructions (Addendum)
1. Nystatin/triamcinolone ointment is prescribed for skin inflammation around the rectum. I recommend topical application twice a day (once in the morning and once at night for 10-14 days.) 2. Consider taking Kaopectate to decrease loose stools. 3. Obtain Desitin at the pharmacy (diaper rash cream) and apply it topically to the rectal area to protect the skin. This medicine can be applied after cleansing following bowel movements. 4. Obtain a peribottle at the pharmacy (squirt bottle) and use warm water to clean perineum without excessive scrubbing. Scrubbing/rubbing can make the inflammation worse. 5. After cleaning your perineum with water, use a hairdryer with warm heat to blow dry your bottom. Then applied Desitin. 6. Return in 2 weeks for follow-up 7. The lymph nodes in right groin or enlarged and I recommend follow-up as scheduled with Dr. Oliva Bustard tomorrow.

## 2015-01-04 NOTE — Telephone Encounter (Signed)
[facility] says that ins wont pay fo ra combination of that cream nystatin and triamcinolone, can you call and see what esle can be done, please let pt know when it is fixed,

## 2015-01-04 NOTE — Telephone Encounter (Signed)
Called [facility] - they do not have rx for pt- [facility] filled rx but had to send in separately. Pt aware.

## 2015-01-04 NOTE — Addendum Note (Signed)
Addended by: Elouise Munroe on: 01/04/2015 05:04 PM   Modules accepted: Orders

## 2015-01-05 ENCOUNTER — Inpatient Hospital Stay (HOSPITAL_BASED_OUTPATIENT_CLINIC_OR_DEPARTMENT_OTHER): Payer: Medicare Other | Admitting: Oncology

## 2015-01-05 ENCOUNTER — Inpatient Hospital Stay: Payer: Medicare Other | Attending: Oncology

## 2015-01-05 ENCOUNTER — Encounter: Payer: Self-pay | Admitting: Oncology

## 2015-01-05 VITALS — BP 116/72 | HR 64 | Temp 98.1°F | Wt 138.0 lb

## 2015-01-05 DIAGNOSIS — Z9221 Personal history of antineoplastic chemotherapy: Secondary | ICD-10-CM | POA: Insufficient documentation

## 2015-01-05 DIAGNOSIS — R600 Localized edema: Secondary | ICD-10-CM | POA: Insufficient documentation

## 2015-01-05 DIAGNOSIS — Z86718 Personal history of other venous thrombosis and embolism: Secondary | ICD-10-CM | POA: Insufficient documentation

## 2015-01-05 DIAGNOSIS — Z90710 Acquired absence of both cervix and uterus: Secondary | ICD-10-CM | POA: Insufficient documentation

## 2015-01-05 DIAGNOSIS — N329 Bladder disorder, unspecified: Secondary | ICD-10-CM | POA: Diagnosis not present

## 2015-01-05 DIAGNOSIS — R978 Other abnormal tumor markers: Secondary | ICD-10-CM | POA: Insufficient documentation

## 2015-01-05 DIAGNOSIS — Z923 Personal history of irradiation: Secondary | ICD-10-CM | POA: Diagnosis not present

## 2015-01-05 DIAGNOSIS — Z9049 Acquired absence of other specified parts of digestive tract: Secondary | ICD-10-CM | POA: Insufficient documentation

## 2015-01-05 DIAGNOSIS — C5700 Malignant neoplasm of unspecified fallopian tube: Secondary | ICD-10-CM | POA: Insufficient documentation

## 2015-01-05 DIAGNOSIS — R5383 Other fatigue: Secondary | ICD-10-CM

## 2015-01-05 DIAGNOSIS — Z452 Encounter for adjustment and management of vascular access device: Secondary | ICD-10-CM | POA: Insufficient documentation

## 2015-01-05 DIAGNOSIS — C774 Secondary and unspecified malignant neoplasm of inguinal and lower limb lymph nodes: Secondary | ICD-10-CM | POA: Insufficient documentation

## 2015-01-05 DIAGNOSIS — R197 Diarrhea, unspecified: Secondary | ICD-10-CM

## 2015-01-05 DIAGNOSIS — M1990 Unspecified osteoarthritis, unspecified site: Secondary | ICD-10-CM | POA: Insufficient documentation

## 2015-01-05 DIAGNOSIS — Z8543 Personal history of malignant neoplasm of ovary: Secondary | ICD-10-CM

## 2015-01-05 DIAGNOSIS — R590 Localized enlarged lymph nodes: Secondary | ICD-10-CM | POA: Insufficient documentation

## 2015-01-05 DIAGNOSIS — N814 Uterovaginal prolapse, unspecified: Secondary | ICD-10-CM | POA: Diagnosis not present

## 2015-01-05 LAB — CBC WITH DIFFERENTIAL/PLATELET
BASOS ABS: 0.1 10*3/uL (ref 0–0.1)
Basophils Relative: 1 %
EOS PCT: 1 %
Eosinophils Absolute: 0 10*3/uL (ref 0–0.7)
HEMATOCRIT: 33 % — AB (ref 35.0–47.0)
Hemoglobin: 10.7 g/dL — ABNORMAL LOW (ref 12.0–16.0)
Lymphocytes Relative: 27 %
Lymphs Abs: 1.3 10*3/uL (ref 1.0–3.6)
MCH: 28.7 pg (ref 26.0–34.0)
MCHC: 32.5 g/dL (ref 32.0–36.0)
MCV: 88.2 fL (ref 80.0–100.0)
MONO ABS: 0.8 10*3/uL (ref 0.2–0.9)
Monocytes Relative: 17 %
NEUTROS ABS: 2.6 10*3/uL (ref 1.4–6.5)
NEUTROS PCT: 54 %
PLATELETS: 365 10*3/uL (ref 150–440)
RBC: 3.74 MIL/uL — AB (ref 3.80–5.20)
RDW: 15 % — AB (ref 11.5–14.5)
WBC: 4.8 10*3/uL (ref 3.6–11.0)

## 2015-01-05 LAB — COMPREHENSIVE METABOLIC PANEL
ALT: 10 U/L — AB (ref 14–54)
AST: 19 U/L (ref 15–41)
Albumin: 3.6 g/dL (ref 3.5–5.0)
Alkaline Phosphatase: 61 U/L (ref 38–126)
Anion gap: 7 (ref 5–15)
BILIRUBIN TOTAL: 0.4 mg/dL (ref 0.3–1.2)
BUN: 19 mg/dL (ref 6–20)
CO2: 28 mmol/L (ref 22–32)
CREATININE: 1.09 mg/dL — AB (ref 0.44–1.00)
Calcium: 8.5 mg/dL — ABNORMAL LOW (ref 8.9–10.3)
Chloride: 101 mmol/L (ref 101–111)
GFR, EST AFRICAN AMERICAN: 52 mL/min — AB (ref >60)
GFR, EST NON AFRICAN AMERICAN: 45 mL/min — AB (ref >60)
Glucose, Bld: 80 mg/dL (ref 65–99)
Potassium: 3.9 mmol/L (ref 3.5–5.1)
Sodium: 136 mmol/L (ref 135–145)
TOTAL PROTEIN: 7.1 g/dL (ref 6.5–8.1)

## 2015-01-05 NOTE — Progress Notes (Signed)
[place] @ [facility] [phone]  Fax:([phone]) [place] OB: 21-Aug-1928  MR#: [id number]  EML#:[id number]  Patient Care Team: Ezequiel Kayser, [name]  CHIEF COMPLAINT:  Chief Complaint  Patient presents with  . carcinoma of fallopian tube  . Fatigue  . Diarrhea   Oncology History   . 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  Adequate TRS, no macroscopic residual. IP/IV chemotherapy with DDP and paclitaxel on GOG protocol, chemo and Bev consolidation completed in 03/2011 2. 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 November 14, 2011- Patient was started on carboplatin and gemcitabine 4. Finished 6 cycles of chemotherapy in April of 2014 with carboplatin and gemcitabine. Tolerance was fairly good except for neutropenia and thrombocytopenia. 5.recurrent disease by CT scan February of 2015 6.radiation therapy to pelvis  May of 2015 7.upper extremity  . and deep vein thrombosis associated with port.(June of 2015) patient started on   [place] had port removed and had   thrombectomy September 06, 2013. 9.progressing disease in the right inguinal area 10.  Patient was started on Taxol and Avastin on a weekly basis 11.  Patient is   On chemotherapy holidays because of persistent urinary problem   12Recurrent carcinoma of fallopian tube.  Presently on observation        INTERVAL HISTORY:  81 year old lady with recurrent ovarian cancer treated with chemotherapy and radiation therapy to the right inguinal area.  Complains of swelling of the right lower extremity where patient had a significant injury.  Tenderness present.  No nausea.  No vomiting.  Patient continues to have problem with bladder being evaluated by Dr. Richrd Humbles, gynecologist.  No chills.  No fever.  No abdominal pain.  Patient had a GYN evaluation by Dr. Richrd Humbles.  Bladder problems  are getting gradually better She has noticed some swelling in the right inguinal area.  Here for further follow-up and treatment consideration  REVIEW OF SYSTEMS:   GENERAL:  Feels good.  Active.  No fevers, sweats or weight loss. Recent had a recent fall  PERFORMANCE STATUS (ECOG):01 HEENT:  No visual changes, runny nose, sore throat, mouth sores or tenderness. Lungs: No shortness of breath or cough.  No hemoptysis. Cardiac:  No chest pain, palpitations, orthopnea, or PND. GI:  No nausea, vomiting, diarrhea, constipation, melena or hematochezia. GU:  Continues to have incontinence of urine.  Intermittent burning.  Recently had cystoscopy which revealed no evidence of malignancy.  Musculoskeletal:  No back pain.  No joint pain.  No muscle tenderness.Swelling of the right lower extremity ecchymosis.  To follow  Extremities:  No pain or swelling. Skin:  No rashes or skin changes. Neuro:  No headache, numbness or weakness, balance or coordination issues. Endocrine:  No diabetes, thyroid issues, hot flashes or night sweats. Psych:  No mood changes, depression or anxiety. Pain:  No focal pain. Review of systems:  All other systems reviewed and found to be negative. As per HPI. Otherwise, a complete review of systems is negatve.    Smoking History: Smoking History Never Smoked.  PFSH: Family History: noncontributory  Comments: no maligancies  Social History: negative alcohol, negative tobacco  Additional Past Medical and Surgical History: Med:   HTN for 9 years             celiac disease, not active,  seasonal allergies              arthritis of the small joints,  TIA 20 years ago no further symptoms.    Mitral wall prolapse without any significant cardiac arrhythmias or congestive heart failure.    Surg:     1963  hysterectomy for descensus               1996  cholecystectomy through the scope,               2000  sling procedure for incontinence (Dr. Kathyrn Lass)    Fellows:  Patient does have advance healthcare directive, Patient   does not desire to make any changes HEALTH MAINTENANCE: Social History  Substance Use Topics  . Smoking status: Never Smoker   . Smokeless tobacco: None  . Alcohol Use: No      Allergies  Allergen Reactions  . Alendronate Sodium     Other reaction(s): Other (See Comments) Dysphagia  . Duloxetine Hcl Diarrhea  . Risedronate Sodium Rash    Aching, dysphagia  . Nitrofurantoin Other (See Comments)  . Lovastatin     Other reaction(s): Other (See Comments) GI upset  . Sulfa Antibiotics Rash     OBJECTIVE:  Filed Vitals:   01/05/15 1104  BP: 116/72  Pulse: 64  Temp: 98.1 F (36.7 C)     Body mass index is 26.95 kg/(m^2).    ECOG FS:1 - Symptomatic but completely ambulatory  PHYSICAL EXAM: General  status: Performance status is good.  Patient has not lost significant weight HEENT: No evidence of stomatitis. Sclera and conjunctivae :: No jaundice.   pale looking. Lungs: Air  entry equal on both sides.  No rhonchi.  No rales.  Cardiac: Heart sounds are normal.  No pericardial rub.  No murmur. Lymphatic system: Cervical, axillary, inguinal, lymph nodes not palpable Right inguinal lymph node has increased in size  GI: Abdomen is soft.  No ascites.  Liver spleen not palpable.  No tenderness.  Bowel sounds are within normal limit Lower extremity: Right lower extremity ecchymosis swelling.  Is improved but tenderness persist Neurological system: Higher functions, cranial nerves intact no evidence of peripheral neuropathy. Skin: No rash.  No ecchymosis.Marland Kitchen\   LAB RESULTS:  Appointment on 01/05/2015  Component Date Value Ref Range Status  . WBC 01/05/2015 4.8  3.6 - 11.0 K/uL Final  . RBC 01/05/2015 3.74* 3.80 - 5.20 MIL/uL Final  . Hemoglobin 01/05/2015 10.7* 12.0 - 16.0 g/dL Final  . HCT 01/05/2015 33.0* 35.0 - 47.0 % Final  . MCV 01/05/2015 88.2  80.0 - 100.0 fL Final  . MCH 01/05/2015 28.7   26.0 - 34.0 pg Final  . MCHC 01/05/2015 32.5  32.0 - 36.0 g/dL Final  . RDW 01/05/2015 15.0* 11.5 - 14.5 % Final  . Platelets 01/05/2015 365  150 - 440 K/uL Final  . Neutrophils Relative % 01/05/2015 54   Final  . Neutro Abs 01/05/2015 2.6  1.4 - 6.5 K/uL Final  . Lymphocytes Relative 01/05/2015 27   Final  . Lymphs Abs 01/05/2015 1.3  1.0 - 3.6 K/uL Final  . Monocytes Relative 01/05/2015 17   Final  . Monocytes Absolute 01/05/2015 0.8  0.2 - 0.9 K/uL Final  . Eosinophils Relative 01/05/2015 1   Final  . Eosinophils Absolute 01/05/2015 0.0  0 - 0.7 K/uL Final  . Basophils Relative 01/05/2015 1   Final  . Basophils Absolute 01/05/2015 0.1  0 - 0.1 K/uL Final  .  Sodium 01/05/2015 136  135 - 145 mmol/L Final  . Potassium 01/05/2015 3.9  3.5 - 5.1 mmol/L Final  . Chloride 01/05/2015 101  101 - 111 mmol/L Final  . CO2 01/05/2015 28  22 - 32 mmol/L Final  . Glucose, Bld 01/05/2015 80  65 - 99 mg/dL Final  . BUN 01/05/2015 19  6 - 20 mg/dL Final  . Creatinine, Ser 01/05/2015 1.09* 0.44 - 1.00 mg/dL Final  . Calcium 01/05/2015 8.5* 8.9 - 10.3 mg/dL Final  . Total Protein 01/05/2015 7.1  6.5 - 8.1 g/dL Final  . Albumin 01/05/2015 3.6  3.5 - 5.0 g/dL Final  . AST 01/05/2015 19  15 - 41 U/L Final  . ALT 01/05/2015 10* 14 - 54 U/L Final  . Alkaline Phosphatase 01/05/2015 61  38 - 126 U/L Final  . Total Bilirubin 01/05/2015 0.4  0.3 - 1.2 mg/dL Final  . GFR calc non Af Amer 01/05/2015 45* >60 mL/min Final  . GFR calc Af Amer 01/05/2015 52* >60 mL/min Final   Comment: (NOTE) The eGFR has been calculated using the CKD EPI equation. This calculation has not been validated in all clinical situations. eGFR's persistently <60 mL/min signify possible Chronic Kidney Disease.   . Anion gap 01/05/2015 7  5 - 15 Final   Component     Latest Ref Rng 09/03/2013 11/01/2013 01/11/2014 02/10/2014 02/24/2014  CA 125     0.0 - 38.1 U/mL 37.8 (H) 59.4 (H) 59.6 (H) 32.1 27.9   Component     Latest Ref Rng  05/12/2014 06/02/2014 08/18/2014 10/13/2014  CA 125     0.0 - 38.1 U/mL 30.7 30.3 28.1 21.4   Dx:  Carcinoma of fallopian tube, unspecif...          Ref Range 2d ago    CA 125 0.0 - 38.1 U/mL 26.2   Comments: (NOTE)              ASSESSMENT: 81 year old lady with recurrent ovarian cancer. Review of tumor markers shows stable disease.  No clinical evidence of recurrent disease.   Continuing bladder problems and had a recent cystoscopy negative for malignancy This and was seen by urologist locally as well as was referred to gynecologist.  Was found to have prolapse of the uterus All lab data in tumor markers have been reviewed There is no evidence of recurrent or progressive disease MEDICAL DECISION MAKING:  Patient is recurrent and progressive ovarian cancer with increase in the size of the right inguinal lymph node  Recheck tumor markers CT scan of abdomen and pelvis for further staging workup Reevaluate patient after that information is available   Patient expressed understanding and was in agreement with this plan. She also understands that She can call clinic at any time with any questions, concerns, or complaints.    No matching staging information was found for the patient.  Forest Gleason, MD   01/05/2015 8:53 PM

## 2015-01-06 LAB — CA 125: CA 125: 44.9 U/mL — ABNORMAL HIGH (ref 0.0–38.1)

## 2015-01-11 ENCOUNTER — Ambulatory Visit
Admission: RE | Admit: 2015-01-11 | Discharge: 2015-01-11 | Disposition: A | Discharge status: Home / Self Care | Payer: Medicare Other | Source: Ambulatory Visit | Attending: Oncology | Admitting: Oncology

## 2015-01-11 DIAGNOSIS — C5700 Malignant neoplasm of unspecified fallopian tube: Secondary | ICD-10-CM | POA: Diagnosis present

## 2015-01-11 MED ORDER — IOHEXOL 300 MG/ML  SOLN
50.0000 mL | Freq: Once | INTRAMUSCULAR | Status: AC | PRN
  Administered 2015-01-11: 100 mL via INTRAVENOUS

## 2015-01-12 ENCOUNTER — Inpatient Hospital Stay: Payer: Medicare Other

## 2015-01-12 ENCOUNTER — Inpatient Hospital Stay (HOSPITAL_BASED_OUTPATIENT_CLINIC_OR_DEPARTMENT_OTHER): Payer: Medicare Other | Admitting: Oncology

## 2015-01-12 ENCOUNTER — Encounter: Payer: Self-pay | Admitting: Oncology

## 2015-01-12 VITALS — BP 120/75 | HR 66 | Temp 95.9°F | Resp 18 | Wt 136.6 lb

## 2015-01-12 DIAGNOSIS — R590 Localized enlarged lymph nodes: Secondary | ICD-10-CM

## 2015-01-12 DIAGNOSIS — Z9221 Personal history of antineoplastic chemotherapy: Secondary | ICD-10-CM | POA: Diagnosis not present

## 2015-01-12 DIAGNOSIS — R600 Localized edema: Secondary | ICD-10-CM

## 2015-01-12 DIAGNOSIS — Z923 Personal history of irradiation: Secondary | ICD-10-CM

## 2015-01-12 DIAGNOSIS — Z8543 Personal history of malignant neoplasm of ovary: Secondary | ICD-10-CM

## 2015-01-12 DIAGNOSIS — C5700 Malignant neoplasm of unspecified fallopian tube: Secondary | ICD-10-CM

## 2015-01-12 DIAGNOSIS — R5383 Other fatigue: Secondary | ICD-10-CM

## 2015-01-12 DIAGNOSIS — C774 Secondary and unspecified malignant neoplasm of inguinal and lower limb lymph nodes: Secondary | ICD-10-CM

## 2015-01-12 DIAGNOSIS — C569 Malignant neoplasm of unspecified ovary: Secondary | ICD-10-CM

## 2015-01-12 DIAGNOSIS — Z86718 Personal history of other venous thrombosis and embolism: Secondary | ICD-10-CM

## 2015-01-12 DIAGNOSIS — R978 Other abnormal tumor markers: Secondary | ICD-10-CM

## 2015-01-12 DIAGNOSIS — N329 Bladder disorder, unspecified: Secondary | ICD-10-CM

## 2015-01-12 DIAGNOSIS — N814 Uterovaginal prolapse, unspecified: Secondary | ICD-10-CM

## 2015-01-12 MED ORDER — HYDROCODONE-ACETAMINOPHEN 5-325 MG PO TABS: 1.0000 | ORAL_TABLET | Freq: Four times a day (QID) | ORAL | Status: DC | PRN

## 2015-01-12 MED ORDER — GABAPENTIN 600 MG PO TABS: 600.0000 mg | ORAL_TABLET | Freq: Three times a day (TID) | ORAL | Status: DC

## 2015-01-12 MED ORDER — HEPARIN SOD (PORK) LOCK FLUSH 100 UNIT/ML IV SOLN
500.0000 [IU] | Freq: Once | INTRAVENOUS | Status: AC
  Administered 2015-01-12: 500 [IU] via INTRAVENOUS
  Filled 2015-01-12: qty 5

## 2015-01-12 MED ORDER — SODIUM CHLORIDE 0.9 % IJ SOLN
10.0000 mL | INTRAMUSCULAR | Status: AC | PRN
  Administered 2015-01-12: 10 mL via INTRAVENOUS
  Filled 2015-01-12: qty 10

## 2015-01-12 NOTE — Progress Notes (Signed)
Patient requesting refill for Gabapentin.  Also states she continues to have some right sided abdominal pain.

## 2015-01-13 ENCOUNTER — Encounter: Payer: Self-pay | Admitting: Oncology

## 2015-01-13 NOTE — Progress Notes (Signed)
[place] @ [facility] [phone]  Fax:([phone] [place] OB: Jul 28, 1928  MR#: [id number]  QVZ#:[id number]  Patient Care Team: Ezequiel Kayser, [name]  CHIEF COMPLAINT:  Chief Complaint  Patient presents with  . Fallopian tube cancer   Oncology History   . 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  Adequate TRS, no macroscopic residual. IP/IV chemotherapy with DDP and paclitaxel on GOG protocol, chemo and Bev consolidation completed in 03/2011 2. 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 November 14, 2011- Patient was started on carboplatin and gemcitabine 4. Finished 6 cycles of chemotherapy in April of 2014 with carboplatin and gemcitabine. Tolerance was fairly good except for neutropenia and thrombocytopenia. 5.recurrent disease by CT scan February of 2015 6.radiation therapy to pelvis  May of 2015 7.upper extremity  . and deep vein thrombosis associated with port.(June of 2015) patient started on   [place] had port removed and had   thrombectomy September 06, 2013. 9.progressing disease in the right inguinal area 10.  Patient was started on Taxol and Avastin on a weekly basis 11.  Patient is   On chemotherapy holidays because of persistent urinary problem   12Recurrent carcinoma of fallopian tube.  Presently on observation        INTERVAL HISTORY:  81 year old lady with recurrent ovarian cancer treated with chemotherapy and radiation therapy to the right inguinal area.  Complains of swelling of the right lower extremity where patient had a significant injury.  Tenderness present.  No nausea.  No vomiting.  Patient continues to have problem with bladder being evaluated by Dr. Richrd Humbles, gynecologist.  No chills.  No fever.  No abdominal pain.  Patient has noticed progressive enlargement of the right inguinal area.  A CT scan was done which  has been independently reviewed.  Tumor markers are rising.  Patient and family here to discuss the results and further planning of treatment  No chills or fever. REVIEW OF SYSTEMS:   GENERAL:  Feels good.  Active.  No fevers, sweats or weight loss. Recent had a recent fall  PERFORMANCE STATUS (ECOG):01 HEENT:  No visual changes, runny nose, sore throat, mouth sores or tenderness. Lungs: No shortness of breath or cough.  No hemoptysis. Cardiac:  No chest pain, palpitations, orthopnea, or PND. GI:  No nausea, vomiting, diarrhea, constipation, melena or hematochezia. GU:  Continues to have incontinence of urine.  Intermittent burning.  Recently had cystoscopy which revealed no evidence of malignancy.  Musculoskeletal:  No back pain.  No joint pain.  No muscle tenderness.Swelling of the right lower extremity ecchymosis.  To follow  Extremities:  No pain or swelling. Skin:  No rashes or skin changes. Neuro:  No headache, numbness or weakness, balance or coordination issues. Endocrine:  No diabetes, thyroid issues, hot flashes or night sweats. Psych:  No mood changes, depression or anxiety. Pain:  No focal pain. Review of systems:  All other systems reviewed and found to be negative. As per HPI. Otherwise, a complete review of systems is negatve.    Smoking History: Smoking History Never Smoked.  PFSH: Family History: noncontributory  Comments: no maligancies  Social History: negative alcohol, negative tobacco  Additional Past Medical and Surgical History: Med:   HTN for 9 years             celiac disease, not active,  seasonal allergies              arthritis of the small joints,  TIA 20 years ago no further symptoms.    Mitral wall prolapse without any significant cardiac arrhythmias or congestive heart failure.    Surg:     1963  hysterectomy for descensus               1996  cholecystectomy through the scope,               2000  sling procedure for incontinence (Dr.  Kathyrn Lass)   [place]:  Patient does have advance healthcare directive, Patient   does not desire to make any changes HEALTH MAINTENANCE: Social History  Substance Use Topics  . Smoking status: Never Smoker   . Smokeless tobacco: None  . Alcohol Use: No      Allergies  Allergen Reactions  . Alendronate Sodium     Other reaction(s): Other (See Comments) Dysphagia  . Duloxetine Hcl Diarrhea  . Risedronate Sodium Rash    Aching, dysphagia  . Nitrofurantoin Other (See Comments)  . Lovastatin     Other reaction(s): Other (See Comments) GI upset  . Sulfa Antibiotics Rash     OBJECTIVE:  Filed Vitals:   01/12/15 1152  BP: 120/75  Pulse: 66  Temp: 95.9 F (35.5 C)  Resp: 18     Body mass index is 26.67 kg/(m^2).    ECOG FS:1 - Symptomatic but completely ambulatory  PHYSICAL EXAM: General  status: Performance status is good.  Patient has not lost significant weight HEENT: No evidence of stomatitis. Sclera and conjunctivae :: No jaundice.   pale looking. Lungs: Air  entry equal on both sides.  No rhonchi.  No rales.  Cardiac: Heart sounds are normal.  No pericardial rub.  No murmur. Lymphatic system: Cervical, axillary, inguinal, lymph nodes not palpable Right inguinal lymph node has increased in size  GI: Abdomen is soft.  No ascites.  Liver spleen not palpable.  No tenderness.  Bowel sounds are within normal limit Lower extremity: Right lower extremity ecchymosis swelling.  Is improved but tenderness persist Neurological system: Higher functions, cranial nerves intact no evidence of peripheral neuropathy. Skin: No rash.  No ecchymosis.Marland Kitchen\   LAB RESULTS:  No visits with results within 2 Day(s) from this visit. Latest known visit with results is:  Appointment on 01/05/2015  Component Date Value Ref Range Status  . WBC 01/05/2015 4.8  3.6 - 11.0 K/uL Final  . RBC 01/05/2015 3.74* 3.80 - 5.20 MIL/uL Final  . Hemoglobin 01/05/2015 10.7* 12.0 - 16.0 g/dL  Final  . HCT 01/05/2015 33.0* 35.0 - 47.0 % Final  . MCV 01/05/2015 88.2  80.0 - 100.0 fL Final  . MCH 01/05/2015 28.7  26.0 - 34.0 pg Final  . MCHC 01/05/2015 32.5  32.0 - 36.0 g/dL Final  . RDW 01/05/2015 15.0* 11.5 - 14.5 % Final  . Platelets 01/05/2015 365  150 - 440 K/uL Final  . Neutrophils Relative % 01/05/2015 54   Final  . Neutro Abs 01/05/2015 2.6  1.4 - 6.5 K/uL Final  . Lymphocytes Relative 01/05/2015 27   Final  . Lymphs Abs 01/05/2015 1.3  1.0 - 3.6 K/uL Final  . Monocytes Relative 01/05/2015 17   Final  . Monocytes Absolute 01/05/2015 0.8  0.2 - 0.9 K/uL Final  . Eosinophils Relative 01/05/2015 1   Final  . Eosinophils Absolute 01/05/2015 0.0  0 - 0.7 K/uL Final  .  Basophils Relative 01/05/2015 1   Final  . Basophils Absolute 01/05/2015 0.1  0 - 0.1 K/uL Final  . Sodium 01/05/2015 136  135 - 145 mmol/L Final  . Potassium 01/05/2015 3.9  3.5 - 5.1 mmol/L Final  . Chloride 01/05/2015 101  101 - 111 mmol/L Final  . CO2 01/05/2015 28  22 - 32 mmol/L Final  . Glucose, Bld 01/05/2015 80  65 - 99 mg/dL Final  . BUN 01/05/2015 19  6 - 20 mg/dL Final  . Creatinine, Ser 01/05/2015 1.09* 0.44 - 1.00 mg/dL Final  . Calcium 01/05/2015 8.5* 8.9 - 10.3 mg/dL Final  . Total Protein 01/05/2015 7.1  6.5 - 8.1 g/dL Final  . Albumin 01/05/2015 3.6  3.5 - 5.0 g/dL Final  . AST 01/05/2015 19  15 - 41 U/L Final  . ALT 01/05/2015 10* 14 - 54 U/L Final  . Alkaline Phosphatase 01/05/2015 61  38 - 126 U/L Final  . Total Bilirubin 01/05/2015 0.4  0.3 - 1.2 mg/dL Final  . GFR calc non Af Amer 01/05/2015 45* >60 mL/min Final  . GFR calc Af Amer 01/05/2015 52* >60 mL/min Final   Comment: (NOTE) The eGFR has been calculated using the CKD EPI equation. This calculation has not been validated in all clinical situations. eGFR's persistently <60 mL/min signify possible Chronic Kidney Disease.   . Anion gap 01/05/2015 7  5 - 15 Final  . CA 125 01/05/2015 44.9* 0.0 - 38.1 U/mL Final   Comment:  (NOTE) Roche ECLIA methodology Performed At: [facility] [place], [place] [id number] Lindon Romp MD PJ:[id number]      CA 125 (last week) was 44.9 which is rising from previous 1 month ago which was 26.2     ASSESSMENT: 81 year old lady with recurrent ovarian cancer. Review of tumor markers shows stable disease.  No clinical evidence of recurrent disease.   Continuing bladder problems and had a recent cystoscopy negative for malignancy This and was seen by urologist locally as well as was referred to gynecologist.  Was found to have prolapse of the uterus All lab data in tumor markers have been reviewed There is no evidence of recurrent or progressive disease MEDICAL DECISION MAKING:  Patient is recurrent and progressive ovarian cancer with increase in the size of the right inguinal lymph node  CT scan has been reviewed independently sows progressive enlargement of right inguinal adenopathy   CT scan has been reviewed with the patient and family.  Rising tumor markers and CT scan appearance suggests progressive disease.  Patient's last Taxol chemotherapy 2 weeks patient responded very well was approximately 9 months ago so we will go back on Taxol and Avastin chemotherapy at present time. Intent of chemotherapy is palliation and relief in symptoms and extending survival All the side effects of chemotherapy including myelosuppression, alopecia, nausea vomiting fatigue weakness.  Secondary infection, and   peripheral neuropathy .  Has been discussed in details. Informal consent has been obtained and will be documented by nurses in the chart  Duration of visit is 25 minutes and 50% of time was spent discussing VARIOUS  options including chemotherapy and observation has been discussed..  Patient expressed understanding and was in agreement with this plan. She also understands that She can call clinic at any time with any questions, concerns, or complaints.      No matching staging information was found for the patient.  Forest Gleason, MD   01/13/2015 2:30 PM

## 2015-01-18 ENCOUNTER — Encounter: Payer: Self-pay | Admitting: Obstetrics and Gynecology

## 2015-01-18 ENCOUNTER — Ambulatory Visit (INDEPENDENT_AMBULATORY_CARE_PROVIDER_SITE_OTHER): Payer: Medicare Other | Admitting: Obstetrics and Gynecology

## 2015-01-18 VITALS — BP 124/71 | HR 76 | Ht 60.0 in | Wt 138.0 lb

## 2015-01-18 DIAGNOSIS — Z4689 Encounter for fitting and adjustment of other specified devices: Secondary | ICD-10-CM | POA: Diagnosis not present

## 2015-01-18 DIAGNOSIS — IMO0002 Reserved for concepts with insufficient information to code with codable children: Secondary | ICD-10-CM

## 2015-01-18 DIAGNOSIS — N8110 Cystocele, unspecified: Secondary | ICD-10-CM | POA: Diagnosis not present

## 2015-01-18 DIAGNOSIS — R339 Retention of urine, unspecified: Secondary | ICD-10-CM

## 2015-01-18 DIAGNOSIS — N952 Postmenopausal atrophic vaginitis: Secondary | ICD-10-CM

## 2015-01-18 DIAGNOSIS — IMO0001 Reserved for inherently not codable concepts without codable children: Secondary | ICD-10-CM

## 2015-01-18 NOTE — Progress Notes (Signed)
Chief complaint: 1.  Pessary maintenance. 2.  Grade 2 cystocele. 3.  Incomplete bladder emptying. 4.  Vaginal atrophy.  Patient presents for follow-up.  Chronic diarrhea has resolved temporarily.  The nystatin/triamcinolone cream did close wonders" for clearing up the perianal irritation.  Patient has started chemotherapy for recurrent fallopian tube cancer identified in the right inguinal lymph node mass.  Past mental history, past surgical history, medications, allergies are reviewed.  Review of systems: Per HPI.  OBJECTIVE: BP 124/71 mmHg  Pulse 76  Ht 5' (1.524 m)  Wt 138 lb (62.596 kg)  BMI 26.95 kg/m2 Pleasant elderly female in no acute distress. Abdomen: Soft, nontender. Pelvic: Right inguinal mass 8 x 5 cm, 1/4 tender, mobile. External genitalia-normal BUS-urethral caruncle. Vagina-second-degree cystocele; vaginal atrophy, moderate; no lesions. Bimanual exam-no masses or tenderness. RV-perianal region is healthy appearing and inflammation is resolved; internal exam not done.  PROCEDURE: Ring with support pessary is removed, cleaned, and reinserted  ASSESSMENT: 1. Pessary maintenance completed. 2.  Grade 2 cystocele, stable. 3.  Incomplete bladder emptying, stable. 4.  Vaginal atrophy, stable. 5.  Chronic diarrhea with perianal inflammation, resolved at present. 6.  Fallopian tube cancer recurrence with 8 x 5 cm right inguinal mass;  Taxol chemotherapy is to be restarted  PLAN: 1.  Pessary is removed, cleaned, and reinserted. 2.  Return in 3 months for follow-up. 3.  Use the nystatin/triamcinolone cream as needed when chronic diarrhea Flare recurs.  A total of 15 minutes were spent face-to-face with the patient during this encounter and over half of that time dealt with counseling and coordination of care.  Brayton Mars, MD  Note: This dictation was prepared with Dragon dictation along with smaller phrase technology. Any transcriptional errors that  result from this process are unintentional.

## 2015-01-18 NOTE — Patient Instructions (Signed)
1.  Patient may continue to use the nystatin/triamcinolone as needed for perianal irritation. 2.  Return in 3 months for follow-up on pessary.

## 2015-02-07 ENCOUNTER — Telehealth: Payer: Self-pay | Admitting: *Deleted

## 2015-02-07 NOTE — Telephone Encounter (Signed)
Broke her tooth and has appt with dentist to have it pulled, they want our ok to pull it

## 2015-02-07 NOTE — Telephone Encounter (Signed)
Per Dr Oliva Bustard, ok to get tooth pulled today. Pt informed

## 2015-02-09 ENCOUNTER — Encounter: Payer: Self-pay | Admitting: Oncology

## 2015-02-09 ENCOUNTER — Inpatient Hospital Stay: Payer: Medicare HMO | Attending: Oncology

## 2015-02-09 ENCOUNTER — Inpatient Hospital Stay (HOSPITAL_BASED_OUTPATIENT_CLINIC_OR_DEPARTMENT_OTHER): Payer: Medicare HMO | Admitting: Oncology

## 2015-02-09 ENCOUNTER — Inpatient Hospital Stay: Payer: Medicare HMO

## 2015-02-09 VITALS — BP 145/83 | HR 72 | Temp 96.0°F | Wt 137.2 lb

## 2015-02-09 DIAGNOSIS — M1990 Unspecified osteoarthritis, unspecified site: Secondary | ICD-10-CM | POA: Diagnosis not present

## 2015-02-09 DIAGNOSIS — R600 Localized edema: Secondary | ICD-10-CM | POA: Insufficient documentation

## 2015-02-09 DIAGNOSIS — Z86718 Personal history of other venous thrombosis and embolism: Secondary | ICD-10-CM | POA: Diagnosis not present

## 2015-02-09 DIAGNOSIS — K900 Celiac disease: Secondary | ICD-10-CM | POA: Diagnosis not present

## 2015-02-09 DIAGNOSIS — C569 Malignant neoplasm of unspecified ovary: Secondary | ICD-10-CM

## 2015-02-09 DIAGNOSIS — Z87828 Personal history of other (healed) physical injury and trauma: Secondary | ICD-10-CM | POA: Insufficient documentation

## 2015-02-09 DIAGNOSIS — R599 Enlarged lymph nodes, unspecified: Secondary | ICD-10-CM

## 2015-02-09 DIAGNOSIS — I10 Essential (primary) hypertension: Secondary | ICD-10-CM

## 2015-02-09 DIAGNOSIS — Z9223 Personal history of estrogen therapy: Secondary | ICD-10-CM

## 2015-02-09 DIAGNOSIS — Z8543 Personal history of malignant neoplasm of ovary: Secondary | ICD-10-CM | POA: Insufficient documentation

## 2015-02-09 DIAGNOSIS — R978 Other abnormal tumor markers: Secondary | ICD-10-CM | POA: Insufficient documentation

## 2015-02-09 DIAGNOSIS — Z9221 Personal history of antineoplastic chemotherapy: Secondary | ICD-10-CM

## 2015-02-09 DIAGNOSIS — Z5112 Encounter for antineoplastic immunotherapy: Secondary | ICD-10-CM | POA: Diagnosis not present

## 2015-02-09 DIAGNOSIS — Z8673 Personal history of transient ischemic attack (TIA), and cerebral infarction without residual deficits: Secondary | ICD-10-CM | POA: Insufficient documentation

## 2015-02-09 DIAGNOSIS — R262 Difficulty in walking, not elsewhere classified: Secondary | ICD-10-CM | POA: Diagnosis not present

## 2015-02-09 DIAGNOSIS — Z5111 Encounter for antineoplastic chemotherapy: Secondary | ICD-10-CM | POA: Insufficient documentation

## 2015-02-09 DIAGNOSIS — Z79899 Other long term (current) drug therapy: Secondary | ICD-10-CM | POA: Diagnosis not present

## 2015-02-09 DIAGNOSIS — C774 Secondary and unspecified malignant neoplasm of inguinal and lower limb lymph nodes: Secondary | ICD-10-CM | POA: Insufficient documentation

## 2015-02-09 DIAGNOSIS — C5700 Malignant neoplasm of unspecified fallopian tube: Secondary | ICD-10-CM

## 2015-02-09 DIAGNOSIS — R32 Unspecified urinary incontinence: Secondary | ICD-10-CM | POA: Insufficient documentation

## 2015-02-09 LAB — CBC WITH DIFFERENTIAL/PLATELET
BASOS ABS: 0.1 10*3/uL (ref 0–0.1)
BASOS PCT: 3 %
Eosinophils Absolute: 0.1 10*3/uL (ref 0–0.7)
Eosinophils Relative: 2 %
HEMATOCRIT: 34.8 % — AB (ref 35.0–47.0)
HEMOGLOBIN: 11.1 g/dL — AB (ref 12.0–16.0)
Lymphocytes Relative: 36 %
Lymphs Abs: 1.3 10*3/uL (ref 1.0–3.6)
MCH: 27.9 pg (ref 26.0–34.0)
MCHC: 32 g/dL (ref 32.0–36.0)
MCV: 87.1 fL (ref 80.0–100.0)
MONO ABS: 0.5 10*3/uL (ref 0.2–0.9)
Monocytes Relative: 14 %
NEUTROS ABS: 1.6 10*3/uL (ref 1.4–6.5)
NEUTROS PCT: 45 %
Platelets: 394 10*3/uL (ref 150–440)
RBC: 3.99 MIL/uL (ref 3.80–5.20)
RDW: 15.7 % — AB (ref 11.5–14.5)
WBC: 3.6 10*3/uL (ref 3.6–11.0)

## 2015-02-09 LAB — COMPREHENSIVE METABOLIC PANEL
ALBUMIN: 3.9 g/dL (ref 3.5–5.0)
ALT: 11 U/L — ABNORMAL LOW (ref 14–54)
AST: 25 U/L (ref 15–41)
Alkaline Phosphatase: 61 U/L (ref 38–126)
Anion gap: 6 (ref 5–15)
BILIRUBIN TOTAL: 0.4 mg/dL (ref 0.3–1.2)
BUN: 22 mg/dL — AB (ref 6–20)
CO2: 28 mmol/L (ref 22–32)
Calcium: 9.2 mg/dL (ref 8.9–10.3)
Chloride: 104 mmol/L (ref 101–111)
Creatinine, Ser: 0.94 mg/dL (ref 0.44–1.00)
GFR calc Af Amer: >60 mL/min (ref >60)
GFR calc non Af Amer: 54 mL/min — ABNORMAL LOW (ref >60)
GLUCOSE: 96 mg/dL (ref 65–99)
POTASSIUM: 4.6 mmol/L (ref 3.5–5.1)
SODIUM: 138 mmol/L (ref 135–145)
TOTAL PROTEIN: 7.3 g/dL (ref 6.5–8.1)

## 2015-02-09 MED ORDER — SODIUM CHLORIDE 0.9 % IJ SOLN
10.0000 mL | INTRAMUSCULAR | Status: DC | PRN
  Administered 2015-02-09: 10 mL via INTRAVENOUS
  Filled 2015-02-09: qty 10

## 2015-02-09 MED ORDER — DIPHENHYDRAMINE HCL 50 MG/ML IJ SOLN
50.0000 mg | Freq: Once | INTRAMUSCULAR | Status: DC
  Filled 2015-02-09: qty 1

## 2015-02-09 MED ORDER — FAMOTIDINE IN NACL 20-0.9 MG/50ML-% IV SOLN
20.0000 mg | Freq: Once | INTRAVENOUS | Status: AC
  Administered 2015-02-09: 20 mg via INTRAVENOUS
  Filled 2015-02-09: qty 50

## 2015-02-09 MED ORDER — HEPARIN SOD (PORK) LOCK FLUSH 100 UNIT/ML IV SOLN
500.0000 [IU] | Freq: Once | INTRAVENOUS | Status: AC
  Administered 2015-02-09: 500 [IU] via INTRAVENOUS
  Filled 2015-02-09: qty 5

## 2015-02-09 MED ORDER — SODIUM CHLORIDE 0.9 % IV SOLN
Freq: Once | INTRAVENOUS | Status: AC
  Administered 2015-02-09: 11:00:00 via INTRAVENOUS
  Filled 2015-02-09: qty 1000

## 2015-02-09 MED ORDER — PACLITAXEL CHEMO INJECTION 300 MG/50ML
80.0000 mg/m2 | Freq: Once | INTRAVENOUS | Status: AC
  Administered 2015-02-09: 132 mg via INTRAVENOUS
  Filled 2015-02-09: qty 22

## 2015-02-09 MED ORDER — SODIUM CHLORIDE 0.9 % IV SOLN
10.0000 mg/kg | Freq: Once | INTRAVENOUS | Status: AC
  Administered 2015-02-09: 625 mg via INTRAVENOUS
  Filled 2015-02-09: qty 21

## 2015-02-09 MED ORDER — SODIUM CHLORIDE 0.9 % IV SOLN
Freq: Once | INTRAVENOUS | Status: AC
  Administered 2015-02-09: 12:00:00 via INTRAVENOUS
  Filled 2015-02-09: qty 8

## 2015-02-09 NOTE — Progress Notes (Signed)
[place] @ [facility] [phone]) [phone]  Fax:(336) [place] OB: 04-17-1928  MR#: [id number]  JGG#:[id number]  Patient Care Team: Ezequiel Kayser, [name]  CHIEF COMPLAINT:  Chief Complaint  Patient presents with  . Ovarian Cancer   Oncology History   . 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  Adequate TRS, no macroscopic residual. IP/IV chemotherapy with DDP and paclitaxel on GOG protocol, chemo and Bev consolidation completed in 03/2011 2. 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 November 14, 2011- Patient was started on carboplatin and gemcitabine 4. Finished 6 cycles of chemotherapy in April of 2014 with carboplatin and gemcitabine. Tolerance was fairly good except for neutropenia and thrombocytopenia. 5.recurrent disease by CT scan February of 2015 6.radiation therapy to pelvis  May of 2015 7.upper extremity  . and deep vein thrombosis associated with port.(June of 2015) patient started on   [place] had port removed and had   thrombectomy September 06, 2013. 9.progressing disease in the right inguinal area 10.  Patient was started on Taxol and Avastin on a weekly basis 11.  Patient is   On chemotherapy holidays because of persistent urinary problem   12Recurrent carcinoma of fallopian tube.  Presently on observation    13.  Progressive right inguinal lymph node enlargement so patient was started on Taxol and Avastin (January, 2016)    INTERVAL HISTORY:  82 year old lady with recurrent ovarian cancer treated with chemotherapy and radiation therapy to the right inguinal area.  Complains of swelling of the right lower extremity where patient had a significant injury.  Tenderness present.  No nausea.  No vomiting.  Patient continues to have problem with bladder being evaluated by Dr. Richrd Humbles, gynecologist.  No chills.  No fever.  No abdominal  pain.  Patient has noticed progressive enlargement of the right inguinal area.  A CT scan was done which has been independently reviewed.  Tumor markers are rising.  Patient and family here to discuss the results and further planning of treatment  Patient came today further follow-up as a progressive enlargement of the right inguinal lymph node with increasing swelling of the right lower extremity and increasing pain.  So patient is being started on Taxol and Avastin No chills or fever. REVIEW OF SYSTEMS:   GENERAL:  Feels good.  Active.  No fevers, sweats or weight loss. Recent had a recent fall  PERFORMANCE STATUS (ECOG):01 HEENT:  No visual changes, runny nose, sore throat, mouth sores or tenderness. Lungs: No shortness of breath or cough.  No hemoptysis. Cardiac:  No chest pain, palpitations, orthopnea, or PND. GI:  No nausea, vomiting, diarrhea, constipation, melena or hematochezia. GU:  Continues to have incontinence of urine.  Intermittent burning.  Recently had cystoscopy which revealed no evidence of malignancy.  Musculoskeletal:  No back pain.  No joint pain.  No muscle tenderness.Swelling of the right lower extremity ecchymosis.  To follow  Extremities:  No pain or swelling. Skin:  No rashes or skin changes. Neuro:  No headache, numbness or weakness, balance or coordination issues. Endocrine:  No diabetes, thyroid issues, hot flashes or night sweats. Psych:  No mood changes, depression or anxiety. Pain:  No focal pain. Review of systems:  All other systems reviewed and found to be negative. As per HPI. Otherwise, a complete review of systems is negatve.    Smoking History: Smoking History Never Smoked.  PFSH: Family History: noncontributory  Comments: no maligancies  Social History: negative alcohol, negative tobacco  Additional Past Medical and Surgical History: Med:   HTN for 9 years             celiac disease, not active,              seasonal allergies               arthritis of the small joints,  TIA 20 years ago no further symptoms.    Mitral wall prolapse without any significant cardiac arrhythmias or congestive heart failure.    Surg:     1963  hysterectomy for descensus               1996  cholecystectomy through the scope,               2000  sling procedure for incontinence (Dr. Kathyrn Lass)   [place]:  Patient does have advance healthcare directive, Patient   does not desire to make any changes HEALTH MAINTENANCE: Social History  Substance Use Topics  . Smoking status: Never Smoker   . Smokeless tobacco: None  . Alcohol Use: No      Allergies  Allergen Reactions  . Alendronate Sodium     Other reaction(s): Other (See Comments) Dysphagia  . Duloxetine Hcl Diarrhea  . Risedronate Sodium Rash    Aching, dysphagia  . Nitrofurantoin Other (See Comments)  . Lovastatin     Other reaction(s): Other (See Comments) GI upset  . Sulfa Antibiotics Rash     OBJECTIVE:  There were no vitals filed for this visit.   There is no weight on file to calculate BMI.    ECOG FS:1 - Symptomatic but completely ambulatory  PHYSICAL EXAM: General  status: Performance status is good.  Patient has not lost significant weight HEENT: No evidence of stomatitis. Sclera and conjunctivae :: No jaundice.   pale looking. Lungs: Air  entry equal on both sides.  No rhonchi.  No rales.  Cardiac: Heart sounds are normal.  No pericardial rub.  No murmur. Lymphatic system: Cervical, axillary, inguinal, lymph nodes not palpable Right inguinal lymph node has increased in size  GI: Abdomen is soft.  No ascites.  Liver spleen not palpable.  No tenderness.  Bowel sounds are within normal limit Lower extremity: Right lower extremity ecchymosis swelling.  Is improved but tenderness persist Neurological system: Higher functions, cranial nerves intact no evidence of peripheral neuropathy. Skin: No rash.  No ecchymosis.Marland Kitchen\   LAB RESULTS:  Infusion on 02/09/2015   Component Date Value Ref Range Status  . WBC 02/09/2015 3.6  3.6 - 11.0 K/uL Final  . RBC 02/09/2015 3.99  3.80 - 5.20 MIL/uL Final  . Hemoglobin 02/09/2015 11.1* 12.0 - 16.0 g/dL Final  . HCT 02/09/2015 34.8* 35.0 - 47.0 % Final  . MCV 02/09/2015 87.1  80.0 - 100.0 fL Final  . MCH 02/09/2015 27.9  26.0 - 34.0 pg Final  . MCHC 02/09/2015 32.0  32.0 - 36.0 g/dL Final  . RDW 02/09/2015 15.7* 11.5 - 14.5 % Final  . Platelets 02/09/2015 394  150 - 440 K/uL Final  . Neutrophils Relative % 02/09/2015 45   Final  . Neutro Abs 02/09/2015 1.6  1.4 - 6.5 K/uL Final  . Lymphocytes Relative 02/09/2015 36   Final  . Lymphs Abs 02/09/2015 1.3  1.0 - 3.6 K/uL Final  . Monocytes Relative 02/09/2015 14   Final  . Monocytes Absolute 02/09/2015  0.5  0.2 - 0.9 K/uL Final  . Eosinophils Relative 02/09/2015 2   Final  . Eosinophils Absolute 02/09/2015 0.1  0 - 0.7 K/uL Final  . Basophils Relative 02/09/2015 3   Final  . Basophils Absolute 02/09/2015 0.1  0 - 0.1 K/uL Final  . Sodium 02/09/2015 138  135 - 145 mmol/L Final  . Potassium 02/09/2015 4.6  3.5 - 5.1 mmol/L Final   HEMOLYSIS AT THIS LEVEL MAY AFFECT RESULT  . Chloride 02/09/2015 104  101 - 111 mmol/L Final  . CO2 02/09/2015 28  22 - 32 mmol/L Final  . Glucose, Bld 02/09/2015 96  65 - 99 mg/dL Final  . BUN 02/09/2015 22* 6 - 20 mg/dL Final  . Creatinine, Ser 02/09/2015 0.94  0.44 - 1.00 mg/dL Final  . Calcium 02/09/2015 9.2  8.9 - 10.3 mg/dL Final  . Total Protein 02/09/2015 7.3  6.5 - 8.1 g/dL Final  . Albumin 02/09/2015 3.9  3.5 - 5.0 g/dL Final  . AST 02/09/2015 25  15 - 41 U/L Final  . ALT 02/09/2015 11* 14 - 54 U/L Final  . Alkaline Phosphatase 02/09/2015 61  38 - 126 U/L Final  . Total Bilirubin 02/09/2015 0.4  0.3 - 1.2 mg/dL Final  . GFR calc non Af Amer 02/09/2015 54* >60 mL/min Final  . GFR calc Af Amer 02/09/2015 >60  >60 mL/min Final   Comment: (NOTE) The eGFR has been calculated using the CKD EPI equation. This calculation has  not been validated in all clinical situations. eGFR's persistently <60 mL/min signify possible Chronic Kidney Disease.   . Anion gap 02/09/2015 6  5 - 15 Final     CA 125 (last week) was 44.9 which is rising from previous 1 month ago which was 26.2     ASSESSMENT: 82 year old lady with recurrent ovarian cancer. CT scan and clinical examination shows progressive disease. Start patient on Taxol and Avastin therapy because of progressive swelling of right lower extremity as well as pain MEDICAL DECISION MAKING:  Patient is recurrent and progressive ovarian cancer with increase in the size of the right inguinal lymph node  CT scan has been reviewed independently sows progressive enlargement of right inguinal adenopathy   CT scan has been reviewed with the patient and family.  Rising tumor markers and CT scan appearance suggests progressive disease.  Patient's last Taxol chemotherapy 2 weeks patient responded very well was approximately 9 months ago so we will go back on Taxol and Avastin chemotherapy at present time. Intent of chemotherapy is palliation and relief in symptoms and extending survival All the side effects of chemotherapy including myelosuppression, alopecia, nausea vomiting fatigue weakness.  Secondary infection, and   peripheral neuropathy .  Has been discussed in details. Informal consent has been obtained and will be documented by nurses in the chart All lab data has been reviewed will start chemotherapy Patient expressed understanding and was in agreement with this plan. She also understands that She can call clinic at any time with any questions, concerns, or complaints.    No matching staging information was found for the patient.  Forest Gleason, MD   02/09/2015 10:22 AM

## 2015-02-10 ENCOUNTER — Encounter: Payer: Self-pay | Admitting: Oncology

## 2015-02-10 DIAGNOSIS — G629 Polyneuropathy, unspecified: Secondary | ICD-10-CM | POA: Insufficient documentation

## 2015-02-16 ENCOUNTER — Encounter: Payer: Self-pay | Admitting: Oncology

## 2015-02-16 ENCOUNTER — Inpatient Hospital Stay (HOSPITAL_BASED_OUTPATIENT_CLINIC_OR_DEPARTMENT_OTHER): Payer: Medicare HMO

## 2015-02-16 ENCOUNTER — Inpatient Hospital Stay: Payer: Medicare HMO

## 2015-02-16 ENCOUNTER — Inpatient Hospital Stay (HOSPITAL_BASED_OUTPATIENT_CLINIC_OR_DEPARTMENT_OTHER): Payer: Medicare HMO | Admitting: Oncology

## 2015-02-16 VITALS — BP 133/81 | HR 72 | Temp 96.7°F | Resp 18 | Wt 137.6 lb

## 2015-02-16 DIAGNOSIS — Z9221 Personal history of antineoplastic chemotherapy: Secondary | ICD-10-CM

## 2015-02-16 DIAGNOSIS — C569 Malignant neoplasm of unspecified ovary: Secondary | ICD-10-CM

## 2015-02-16 DIAGNOSIS — R32 Unspecified urinary incontinence: Secondary | ICD-10-CM

## 2015-02-16 DIAGNOSIS — Z9223 Personal history of estrogen therapy: Secondary | ICD-10-CM

## 2015-02-16 DIAGNOSIS — Z8543 Personal history of malignant neoplasm of ovary: Secondary | ICD-10-CM

## 2015-02-16 DIAGNOSIS — R599 Enlarged lymph nodes, unspecified: Secondary | ICD-10-CM | POA: Diagnosis not present

## 2015-02-16 DIAGNOSIS — R600 Localized edema: Secondary | ICD-10-CM | POA: Diagnosis not present

## 2015-02-16 DIAGNOSIS — C774 Secondary and unspecified malignant neoplasm of inguinal and lower limb lymph nodes: Secondary | ICD-10-CM | POA: Diagnosis not present

## 2015-02-16 DIAGNOSIS — C5700 Malignant neoplasm of unspecified fallopian tube: Secondary | ICD-10-CM

## 2015-02-16 DIAGNOSIS — K900 Celiac disease: Secondary | ICD-10-CM

## 2015-02-16 DIAGNOSIS — I10 Essential (primary) hypertension: Secondary | ICD-10-CM

## 2015-02-16 DIAGNOSIS — M1990 Unspecified osteoarthritis, unspecified site: Secondary | ICD-10-CM

## 2015-02-16 DIAGNOSIS — R978 Other abnormal tumor markers: Secondary | ICD-10-CM

## 2015-02-16 LAB — CBC WITH DIFFERENTIAL/PLATELET
BASOS ABS: 0 10*3/uL (ref 0–0.1)
BASOS PCT: 1 %
EOS ABS: 0.1 10*3/uL (ref 0–0.7)
EOS PCT: 2 %
HCT: 32.4 % — ABNORMAL LOW (ref 35.0–47.0)
Hemoglobin: 10.6 g/dL — ABNORMAL LOW (ref 12.0–16.0)
Lymphocytes Relative: 34 %
Lymphs Abs: 1.3 10*3/uL (ref 1.0–3.6)
MCH: 28.2 pg (ref 26.0–34.0)
MCHC: 32.6 g/dL (ref 32.0–36.0)
MCV: 86.6 fL (ref 80.0–100.0)
MONO ABS: 0.4 10*3/uL (ref 0.2–0.9)
Monocytes Relative: 11 %
Neutro Abs: 2 10*3/uL (ref 1.4–6.5)
Neutrophils Relative %: 52 %
PLATELETS: 393 10*3/uL (ref 150–440)
RBC: 3.74 MIL/uL — AB (ref 3.80–5.20)
RDW: 15.4 % — AB (ref 11.5–14.5)
WBC: 3.7 10*3/uL (ref 3.6–11.0)

## 2015-02-16 MED ORDER — DIPHENHYDRAMINE HCL 50 MG/ML IJ SOLN
50.0000 mg | Freq: Once | INTRAMUSCULAR | Status: DC
  Filled 2015-02-16: qty 1

## 2015-02-16 MED ORDER — HEPARIN SOD (PORK) LOCK FLUSH 100 UNIT/ML IV SOLN
500.0000 [IU] | Freq: Once | INTRAVENOUS | Status: AC
  Administered 2015-02-16: 500 [IU] via INTRAVENOUS
  Filled 2015-02-16: qty 5

## 2015-02-16 MED ORDER — SODIUM CHLORIDE 0.9 % IJ SOLN
10.0000 mL | INTRAMUSCULAR | Status: AC | PRN
  Administered 2015-02-16: 10 mL via INTRAVENOUS
  Filled 2015-02-16: qty 10

## 2015-02-16 MED ORDER — PACLITAXEL CHEMO INJECTION 300 MG/50ML
80.0000 mg/m2 | Freq: Once | INTRAVENOUS | Status: AC
  Administered 2015-02-16: 132 mg via INTRAVENOUS
  Filled 2015-02-16: qty 22

## 2015-02-16 MED ORDER — FAMOTIDINE IN NACL 20-0.9 MG/50ML-% IV SOLN
20.0000 mg | Freq: Once | INTRAVENOUS | Status: AC
  Administered 2015-02-16: 20 mg via INTRAVENOUS
  Filled 2015-02-16: qty 50

## 2015-02-16 MED ORDER — SODIUM CHLORIDE 0.9 % IV SOLN
Freq: Once | INTRAVENOUS | Status: AC
  Administered 2015-02-16: 12:00:00 via INTRAVENOUS
  Filled 2015-02-16: qty 1000

## 2015-02-16 MED ORDER — SODIUM CHLORIDE 0.9 % IV SOLN
Freq: Once | INTRAVENOUS | Status: AC
  Administered 2015-02-16: 12:00:00 via INTRAVENOUS
  Filled 2015-02-16: qty 8

## 2015-02-16 NOTE — Progress Notes (Signed)
Patient states neuropathy is bothering her.

## 2015-02-17 NOTE — Progress Notes (Signed)
[place] @ [facility] [phone]  Fax:(336) [place] OB: [birth year]  MR#: [id number]  [id number]  Patient Care Team: Ezequiel Kayser, [name]  CHIEF COMPLAINT:  Chief Complaint  Patient presents with  . Ovarian Cancer   Oncology History   . 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  Adequate TRS, no macroscopic residual. IP/IV chemotherapy with DDP and paclitaxel on GOG protocol, chemo and Bev consolidation completed in 03/2011 2. 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 November 14, 2011- Patient was started on carboplatin and gemcitabine 4. Finished 6 cycles of chemotherapy in April of 2014 with carboplatin and gemcitabine. Tolerance was fairly good except for neutropenia and thrombocytopenia. 5.recurrent disease by CT scan February of 2015 6.radiation therapy to pelvis  May of 2015 7.upper extremity  . and deep vein thrombosis associated with port.(June of 2015) patient started on   [place] had port removed and had   thrombectomy September 06, 2013. 9.progressing disease in the right inguinal area 10.  Patient was started on Taxol and Avastin on a weekly basis 11.  Patient is   On chemotherapy holidays because of persistent urinary problem   12Recurrent carcinoma of fallopian tube.  Presently on observation    13.  Progressive right inguinal lymph node enlargement so patient was started on Taxol and Avastin (January, 2016)    INTERVAL HISTORY:  82 year old lady with recurrent ovarian cancer treated with chemotherapy and radiation therapy to the right inguinal area.  Complains of swelling of the right lower extremity where patient had a significant injury.  Tenderness present.  No nausea.  No vomiting.  Patient continues to have problem with bladder being evaluated by Dr. Richrd Humbles, gynecologist.  No chills.  No fever.  No abdominal  pain.  Patient has noticed progressive enlargement of the right inguinal area.  A CT scan was done which has been independently reviewed.  Tumor markers are rising.  Patient and family here to discuss the results and further planning of treatment  Patient came today further follow-up as a progressive enlargement of the right inguinal lymph node with increasing swelling of the right lower extremity and increasing pain.  So patient is being started on Taxol and Avastin No chills or fever. Hent is here for day 8 chemotherapy no chills no fever.  Swelling in the right lower extremity has decreased.  No tingling.  No numbness. REVIEW OF SYSTEMS:   GENERAL:  Feels good.  Active.  No fevers, sweats or weight loss. Recent had a recent fall  PERFORMANCE STATUS (ECOG):01 HEENT:  No visual changes, runny nose, sore throat, mouth sores or tenderness. Lungs: No shortness of breath or cough.  No hemoptysis. Cardiac:  No chest pain, palpitations, orthopnea, or PND. GI:  No nausea, vomiting, diarrhea, constipation, melena or hematochezia. GU:  Continues to have incontinence of urine.  Intermittent burning.  Recently had cystoscopy which revealed no evidence of malignancy.  Musculoskeletal:  No back pain.  No joint pain.  No muscle tenderness.Swelling of the right lower extremity ecchymosis.  To follow  Extremities:  No pain or swelling. Skin:  No rashes or skin changes. Neuro:  No headache, numbness or weakness, balance or coordination issues. Endocrine:  No diabetes, thyroid issues, hot flashes or night sweats. Psych:  No mood changes, depression or anxiety. Pain:  No focal pain. Review of systems:  All other systems  reviewed and found to be negative. As per HPI. Otherwise, a complete review of systems is negatve.    Smoking History: Smoking History Never Smoked.  PFSH: Family History: noncontributory  Comments: no maligancies  Social History: negative alcohol, negative tobacco  Additional Past  Medical and Surgical History: Med:   HTN for 9 years             celiac disease, not active,              seasonal allergies              arthritis of the small joints,  TIA 20 years ago no further symptoms.    Mitral wall prolapse without any significant cardiac arrhythmias or congestive heart failure.    Surg:     1963  hysterectomy for descensus               1996  cholecystectomy through the scope,               2000  sling procedure for incontinence (Dr. Kathyrn Lass)   [place]:  Patient does have advance healthcare directive, Patient   does not desire to make any changes HEALTH MAINTENANCE: Social History  Substance Use Topics  . Smoking status: Never Smoker   . Smokeless tobacco: None  . Alcohol Use: No      Allergies  Allergen Reactions  . Benadryl [Diphenhydramine] Shortness Of Breath    Sob, rash, swelling  . Alendronate Sodium     Other reaction(s): Other (See Comments) Dysphagia  . Duloxetine Hcl Diarrhea  . Risedronate Sodium Rash    Aching, dysphagia  . Nitrofurantoin Other (See Comments)  . Lovastatin     Other reaction(s): Other (See Comments) GI upset  . Sulfa Antibiotics Rash     OBJECTIVE:  Filed Vitals:   02/16/15 1034  BP: 133/81  Pulse: 72  Temp: 96.7 F (35.9 C)  Resp: 18     Body mass index is 26.87 kg/(m^2).    ECOG FS:1 - Symptomatic but completely ambulatory  PHYSICAL EXAM: General  status: Performance status is good.  Patient has not lost significant weight HEENT: No evidence of stomatitis. Sclera and conjunctivae :: No jaundice.   pale looking. Lungs: Air  entry equal on both sides.  No rhonchi.  No rales.  Cardiac: Heart sounds are normal.  No pericardial rub.  No murmur. Lymphatic system: Cervical, axillary, inguinal, lymph nodes not palpable Right inguinal lymph node has increased in size  GI: Abdomen is soft.  No ascites.  Liver spleen not palpable.  No tenderness.  Bowel sounds are within normal limit Lower  extremity: Right lower extremity ecchymosis swelling.  Is improved but tenderness persist Neurological system: Higher functions, cranial nerves intact no evidence of peripheral neuropathy. Skin: No rash.  No ecchymosis.Marland Kitchen\   LAB RESULTS:  Infusion on 02/16/2015  Component Date Value Ref Range Status  . WBC 02/16/2015 3.7  3.6 - 11.0 K/uL Final  . RBC 02/16/2015 3.74* 3.80 - 5.20 MIL/uL Final  . Hemoglobin 02/16/2015 10.6* 12.0 - 16.0 g/dL Final  . HCT 02/16/2015 32.4* 35.0 - 47.0 % Final  . MCV 02/16/2015 86.6  80.0 - 100.0 fL Final  . MCH 02/16/2015 28.2  26.0 - 34.0 pg Final  . MCHC 02/16/2015 32.6  32.0 - 36.0 g/dL Final  . RDW 02/16/2015 15.4* 11.5 - 14.5 % Final  . Platelets 02/16/2015 393  150 - 440 K/uL Final  . Neutrophils Relative % 02/16/2015  52   Final  . Neutro Abs 02/16/2015 2.0  1.4 - 6.5 K/uL Final  . Lymphocytes Relative 02/16/2015 34   Final  . Lymphs Abs 02/16/2015 1.3  1.0 - 3.6 K/uL Final  . Monocytes Relative 02/16/2015 11   Final  . Monocytes Absolute 02/16/2015 0.4  0.2 - 0.9 K/uL Final  . Eosinophils Relative 02/16/2015 2   Final  . Eosinophils Absolute 02/16/2015 0.1  0 - 0.7 K/uL Final  . Basophils Relative 02/16/2015 1   Final  . Basophils Absolute 02/16/2015 0.0  0 - 0.1 K/uL Final     CA 125 (last week) was 44.9 which is rising from previous 1 month ago which was 26.2     ASSESSMENT: 83 year old lady with recurrent ovarian cancer. CT scan and clinical examination shows progressive disease. Start patient on Taxol and Avastin therapy because of progressive swelling of right lower extremity as well as pain MEDICAL DECISION MAKING:  Patient is recurrent and progressive ovarian cancer with increase in the size of the right inguinal lymph node  And tolerated first cycle of chemotherapy without any significant side effect.  Right inguinal lymph node is decrease in size.  All lab data has been reviewed.  Continue Taxol day 8.  Patient will get day 15  off. All lab data has been reviewed Patient expressed understanding and was in agreement with this plan. She also understands that She can call clinic at any time with any questions, concerns, or complaints.    No matching staging information was found for the patient.  Forest Gleason, MD   02/17/2015 8:02 AM

## 2015-02-24 ENCOUNTER — Encounter: Payer: Self-pay | Admitting: Oncology

## 2015-03-02 ENCOUNTER — Encounter: Payer: Self-pay | Admitting: Oncology

## 2015-03-02 ENCOUNTER — Inpatient Hospital Stay: Payer: Medicare HMO

## 2015-03-02 ENCOUNTER — Inpatient Hospital Stay (HOSPITAL_BASED_OUTPATIENT_CLINIC_OR_DEPARTMENT_OTHER): Payer: Medicare HMO | Admitting: Oncology

## 2015-03-02 VITALS — BP 120/71 | HR 66 | Temp 96.3°F | Resp 18 | Wt 140.5 lb

## 2015-03-02 VITALS — BP 112/69 | HR 61 | Resp 18

## 2015-03-02 DIAGNOSIS — Z9221 Personal history of antineoplastic chemotherapy: Secondary | ICD-10-CM

## 2015-03-02 DIAGNOSIS — R600 Localized edema: Secondary | ICD-10-CM

## 2015-03-02 DIAGNOSIS — Z79899 Other long term (current) drug therapy: Secondary | ICD-10-CM

## 2015-03-02 DIAGNOSIS — Z87828 Personal history of other (healed) physical injury and trauma: Secondary | ICD-10-CM

## 2015-03-02 DIAGNOSIS — R599 Enlarged lymph nodes, unspecified: Secondary | ICD-10-CM

## 2015-03-02 DIAGNOSIS — I10 Essential (primary) hypertension: Secondary | ICD-10-CM

## 2015-03-02 DIAGNOSIS — C569 Malignant neoplasm of unspecified ovary: Secondary | ICD-10-CM

## 2015-03-02 DIAGNOSIS — Z9223 Personal history of estrogen therapy: Secondary | ICD-10-CM

## 2015-03-02 DIAGNOSIS — C5700 Malignant neoplasm of unspecified fallopian tube: Secondary | ICD-10-CM

## 2015-03-02 DIAGNOSIS — R978 Other abnormal tumor markers: Secondary | ICD-10-CM

## 2015-03-02 DIAGNOSIS — C774 Secondary and unspecified malignant neoplasm of inguinal and lower limb lymph nodes: Secondary | ICD-10-CM

## 2015-03-02 DIAGNOSIS — R262 Difficulty in walking, not elsewhere classified: Secondary | ICD-10-CM

## 2015-03-02 DIAGNOSIS — Z8543 Personal history of malignant neoplasm of ovary: Secondary | ICD-10-CM

## 2015-03-02 LAB — COMPREHENSIVE METABOLIC PANEL
ALK PHOS: 67 U/L (ref 38–126)
ALT: 11 U/L — AB (ref 14–54)
AST: 16 U/L (ref 15–41)
Albumin: 3.4 g/dL — ABNORMAL LOW (ref 3.5–5.0)
Anion gap: 6 (ref 5–15)
BUN: 21 mg/dL — AB (ref 6–20)
CALCIUM: 8.5 mg/dL — AB (ref 8.9–10.3)
CHLORIDE: 105 mmol/L (ref 101–111)
CO2: 25 mmol/L (ref 22–32)
CREATININE: 0.88 mg/dL (ref 0.44–1.00)
GFR calc Af Amer: >60 mL/min (ref >60)
GFR, EST NON AFRICAN AMERICAN: 59 mL/min — AB (ref >60)
Glucose, Bld: 82 mg/dL (ref 65–99)
Potassium: 3.6 mmol/L (ref 3.5–5.1)
Sodium: 136 mmol/L (ref 135–145)
TOTAL PROTEIN: 6.9 g/dL (ref 6.5–8.1)
Total Bilirubin: 0.3 mg/dL (ref 0.3–1.2)

## 2015-03-02 LAB — PROTEIN, URINE, RANDOM: Total Protein, Urine: 9 mg/dL

## 2015-03-02 LAB — CBC WITH DIFFERENTIAL/PLATELET
BASOS ABS: 0.1 10*3/uL (ref 0–0.1)
Basophils Relative: 1 %
Eosinophils Absolute: 0 10*3/uL (ref 0–0.7)
Eosinophils Relative: 1 %
HEMATOCRIT: 31.9 % — AB (ref 35.0–47.0)
Hemoglobin: 10.5 g/dL — ABNORMAL LOW (ref 12.0–16.0)
LYMPHS ABS: 1.5 10*3/uL (ref 1.0–3.6)
LYMPHS PCT: 36 %
MCH: 28.5 pg (ref 26.0–34.0)
MCHC: 32.7 g/dL (ref 32.0–36.0)
MCV: 86.9 fL (ref 80.0–100.0)
Monocytes Absolute: 0.6 10*3/uL (ref 0.2–0.9)
Monocytes Relative: 14 %
NEUTROS ABS: 2.1 10*3/uL (ref 1.4–6.5)
Neutrophils Relative %: 48 %
Platelets: 509 10*3/uL — ABNORMAL HIGH (ref 150–440)
RBC: 3.68 MIL/uL — AB (ref 3.80–5.20)
RDW: 16.5 % — ABNORMAL HIGH (ref 11.5–14.5)
WBC: 4.3 10*3/uL (ref 3.6–11.0)

## 2015-03-02 LAB — MAGNESIUM: MAGNESIUM: 1.9 mg/dL (ref 1.7–2.4)

## 2015-03-02 MED ORDER — SODIUM CHLORIDE 0.9 % IV SOLN
Freq: Once | INTRAVENOUS | Status: AC
  Administered 2015-03-02: 11:00:00 via INTRAVENOUS
  Filled 2015-03-02: qty 1000

## 2015-03-02 MED ORDER — HEPARIN SOD (PORK) LOCK FLUSH 100 UNIT/ML IV SOLN
500.0000 [IU] | Freq: Once | INTRAVENOUS | Status: AC
  Administered 2015-03-02: 500 [IU] via INTRAVENOUS
  Filled 2015-03-02: qty 5

## 2015-03-02 MED ORDER — PACLITAXEL CHEMO INJECTION 300 MG/50ML
80.0000 mg/m2 | Freq: Once | INTRAVENOUS | Status: AC
  Administered 2015-03-02: 132 mg via INTRAVENOUS
  Filled 2015-03-02: qty 22

## 2015-03-02 MED ORDER — SODIUM CHLORIDE 0.9% FLUSH
10.0000 mL | INTRAVENOUS | Status: DC | PRN
  Administered 2015-03-02: 10 mL via INTRAVENOUS
  Filled 2015-03-02: qty 10

## 2015-03-02 MED ORDER — SODIUM CHLORIDE 0.9 % IV SOLN
Freq: Once | INTRAVENOUS | Status: AC
  Administered 2015-03-02: 12:00:00 via INTRAVENOUS
  Filled 2015-03-02: qty 8

## 2015-03-02 MED ORDER — SODIUM CHLORIDE 0.9 % IV SOLN
10.0000 mg/kg | Freq: Once | INTRAVENOUS | Status: AC
  Administered 2015-03-02: 625 mg via INTRAVENOUS
  Filled 2015-03-02: qty 20

## 2015-03-02 MED ORDER — FAMOTIDINE IN NACL 20-0.9 MG/50ML-% IV SOLN
20.0000 mg | Freq: Once | INTRAVENOUS | Status: AC
  Administered 2015-03-02: 20 mg via INTRAVENOUS
  Filled 2015-03-02: qty 50

## 2015-03-02 MED ORDER — DIPHENHYDRAMINE HCL 50 MG/ML IJ SOLN
50.0000 mg | Freq: Once | INTRAMUSCULAR | Status: DC
  Filled 2015-03-02: qty 1

## 2015-03-02 NOTE — Progress Notes (Signed)
Patient states she is feeling lightheaded today.  Brought urine specimen today.

## 2015-03-03 ENCOUNTER — Encounter: Payer: Self-pay | Admitting: Oncology

## 2015-03-03 LAB — CA 125: CA 125: 29.4 U/mL (ref 0.0–38.1)

## 2015-03-03 NOTE — Progress Notes (Signed)
[place] @ [facility] [phone]  Fax:(336) [place] OB: 09/29/1928  MR#: [id number]  NIO#:[id number]  Patient Care Team: Ezequiel Kayser, [name]  CHIEF COMPLAINT:  Chief Complaint  Patient presents with  . Ovarian Cancer   Oncology History   . 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  Adequate TRS, no macroscopic residual. IP/IV chemotherapy with DDP and paclitaxel on GOG protocol, chemo and Bev consolidation completed in 03/2011 2. 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 November 14, 2011- Patient was started on carboplatin and gemcitabine 4. Finished 6 cycles of chemotherapy in April of 2014 with carboplatin and gemcitabine. Tolerance was fairly good except for neutropenia and thrombocytopenia. 5.recurrent disease by CT scan February of 2015 6.radiation therapy to pelvis  May of 2015 7.upper extremity  . and deep vein thrombosis associated with port.(June of 2015) patient started on   [place] had port removed and had   thrombectomy September 06, 2013. 9.progressing disease in the right inguinal area 10.  Patient was started on Taxol and Avastin on a weekly basis 11.  Patient is   On chemotherapy holidays because of persistent urinary problem   12Recurrent carcinoma of fallopian tube.  Presently on observation    13.  Progressive right inguinal lymph node enlargement so patient was started on Taxol and Avastin (January, 2016)    INTERVAL HISTORY:  82 year old lady with recurrent ovarian cancer treated with chemotherapy and radiation therapy to the right inguinal area.  Complains of swelling of the right lower extremity where patient had a significant injury.  Tenderness present.  No nausea.  No vomiting.  Patient continues to have problem with bladder being evaluated by Dr. Richrd Humbles, gynecologist.  No chills.  No fever.  No abdominal  pain.  Patient has noticed progressive enlargement of the right inguinal area.  A CT scan was done which has been independently reviewed.  Tumor markers are rising.  Patient and family here to discuss the results and further planning of treatment  Patient came today further follow-up as a progressive enlargement of the right inguinal lymph node with increasing swelling of the right lower extremity and increasing pain.  So patient is being started on Taxol and Avastin No chills or fever. Patient has some difficulty walking.  Patient has noticed more difficulty after stopping Neurontin. Aid in the right inguinal area persist REVIEW OF SYSTEMS:   GENERAL:  Feels good.  Active.  No fevers, sweats or weight loss. Recent had a recent fall  PERFORMANCE STATUS (ECOG):01 HEENT:  No visual changes, runny nose, sore throat, mouth sores or tenderness. Lungs: No shortness of breath or cough.  No hemoptysis. Cardiac:  No chest pain, palpitations, orthopnea, or PND. GI:  No nausea, vomiting, diarrhea, constipation, melena or hematochezia. GU:  Continues to have incontinence of urine.  Intermittent burning.  Recently had cystoscopy which revealed no evidence of malignancy.  Musculoskeletal:  No back pain.  No joint pain.  No muscle tenderness.Swelling of the right lower extremity ecchymosis.  To follow  Extremities:  No pain or swelling. Skin:  No rashes or skin changes. Neuro:  No headache, numbness or weakness, balance or coordination issues. Endocrine:  No diabetes, thyroid issues, hot flashes or night sweats. Psych:  No mood changes, depression or anxiety. Pain:  No focal pain. Review of systems:  All other systems reviewed and found to be  negative. As per HPI. Otherwise, a complete review of systems is negatve.    Smoking History: Smoking History Never Smoked.  PFSH: Family History: noncontributory  Comments: no maligancies  Social History: negative alcohol, negative tobacco  Additional Past  Medical and Surgical History: Med:   HTN for 9 years             celiac disease, not active,              seasonal allergies              arthritis of the small joints,  TIA 20 years ago no further symptoms.    Mitral wall prolapse without any significant cardiac arrhythmias or congestive heart failure.    Surg:     1963  hysterectomy for descensus               1996  cholecystectomy through the scope,               2000  sling procedure for incontinence (Dr. Kathyrn Lass)   [place]:  Patient does have advance healthcare directive, Patient   does not desire to make any changes HEALTH MAINTENANCE: Social History  Substance Use Topics  . Smoking status: Never Smoker   . Smokeless tobacco: None  . Alcohol Use: No      Allergies  Allergen Reactions  . Benadryl [Diphenhydramine] Shortness Of Breath    Sob, rash, swelling  . Alendronate Sodium     Other reaction(s): Other (See Comments) Dysphagia  . Duloxetine Hcl Diarrhea  . Risedronate Sodium Rash    Aching, dysphagia  . Nitrofurantoin Other (See Comments)  . Lovastatin     Other reaction(s): Other (See Comments) GI upset  . Sulfa Antibiotics Rash     OBJECTIVE:  Filed Vitals:   03/02/15 1002  BP: 120/71  Pulse: 66  Temp: 96.3 F (35.7 C)  Resp: 18     Body mass index is 27.44 kg/(m^2).    ECOG FS:1 - Symptomatic but completely ambulatory  PHYSICAL EXAM: General  status: Performance status is good.  Patient has not lost significant weight HEENT: No evidence of stomatitis. Sclera and conjunctivae :: No jaundice.   pale looking. Lungs: Air  entry equal on both sides.  No rhonchi.  No rales.  Cardiac: Heart sounds are normal.  No pericardial rub.  No murmur. Lymphatic system: Cervical, axillary, inguinal, lymph nodes not palpable Right inguinal lymph node has increased in size  GI: Abdomen is soft.  No ascites.  Liver spleen not palpable.  No tenderness.  Bowel sounds are within normal limit Lower  extremity: Right lower extremity ecchymosis swelling.  Is improved but tenderness persist Neurological system: Higher functions, cranial nerves intact no evidence of peripheral neuropathy. Skin: No rash.  No ecchymosis.Marland Kitchen\   LAB RESULTS:  Infusion on 03/02/2015  Component Date Value Ref Range Status  . WBC 03/02/2015 4.3  3.6 - 11.0 K/uL Final  . RBC 03/02/2015 3.68* 3.80 - 5.20 MIL/uL Final  . Hemoglobin 03/02/2015 10.5* 12.0 - 16.0 g/dL Final  . HCT 03/02/2015 31.9* 35.0 - 47.0 % Final  . MCV 03/02/2015 86.9  80.0 - 100.0 fL Final  . MCH 03/02/2015 28.5  26.0 - 34.0 pg Final  . MCHC 03/02/2015 32.7  32.0 - 36.0 g/dL Final  . RDW 03/02/2015 16.5* 11.5 - 14.5 % Final  . Platelets 03/02/2015 509* 150 - 440 K/uL Final  . Neutrophils Relative % 03/02/2015 48   Final  .  Neutro Abs 03/02/2015 2.1  1.4 - 6.5 K/uL Final  . Lymphocytes Relative 03/02/2015 36   Final  . Lymphs Abs 03/02/2015 1.5  1.0 - 3.6 K/uL Final  . Monocytes Relative 03/02/2015 14   Final  . Monocytes Absolute 03/02/2015 0.6  0.2 - 0.9 K/uL Final  . Eosinophils Relative 03/02/2015 1   Final  . Eosinophils Absolute 03/02/2015 0.0  0 - 0.7 K/uL Final  . Basophils Relative 03/02/2015 1   Final  . Basophils Absolute 03/02/2015 0.1  0 - 0.1 K/uL Final  . Sodium 03/02/2015 136  135 - 145 mmol/L Final  . Potassium 03/02/2015 3.6  3.5 - 5.1 mmol/L Final  . Chloride 03/02/2015 105  101 - 111 mmol/L Final  . CO2 03/02/2015 25  22 - 32 mmol/L Final  . Glucose, Bld 03/02/2015 82  65 - 99 mg/dL Final  . BUN 03/02/2015 21* 6 - 20 mg/dL Final  . Creatinine, Ser 03/02/2015 0.88  0.44 - 1.00 mg/dL Final  . Calcium 03/02/2015 8.5* 8.9 - 10.3 mg/dL Final  . Total Protein 03/02/2015 6.9  6.5 - 8.1 g/dL Final  . Albumin 03/02/2015 3.4* 3.5 - 5.0 g/dL Final  . AST 03/02/2015 16  15 - 41 U/L Final  . ALT 03/02/2015 11* 14 - 54 U/L Final  . Alkaline Phosphatase 03/02/2015 67  38 - 126 U/L Final  . Total Bilirubin 03/02/2015 0.3  0.3 -  1.2 mg/dL Final  . GFR calc non Af Amer 03/02/2015 59* >60 mL/min Final  . GFR calc Af Amer 03/02/2015 >60  >60 mL/min Final   Comment: (NOTE) The eGFR has been calculated using the CKD EPI equation. This calculation has not been validated in all clinical situations. eGFR's persistently <60 mL/min signify possible Chronic Kidney Disease.   . Anion gap 03/02/2015 6  5 - 15 Final  . Magnesium 03/02/2015 1.9  1.7 - 2.4 mg/dL Final  . CA 125 03/02/2015 29.4  0.0 - 38.1 U/mL Final   Comment: (NOTE) Roche ECLIA methodology Performed At: [facility] [place], [place] [id number] Lindon Romp MD [id number]   Appointment on 03/02/2015  Component Date Value Ref Range Status  . Total Protein, Urine 03/02/2015 9   Final   NO NORMAL RANGE ESTABLISHED FOR THIS TEST     CA 125 (last week) was 44.9 which is rising from previous 1 month ago which was 26.2     ASSESSMENT: 82 year old lady with recurrent ovarian cancer. CT scan and clinical examination shows progressive disease. Start patient on Taxol and Avastin therapy because of progressive swelling of right lower extremity as well as pain by tumor markers.  [place] @ [facility] [phone]  Fax:(336) [place] OB: 01-Feb-1929  MR#: [id number]  EXB#:[id number]  Patient Care Team: Ezequiel Kayser, [name]  CHIEF COMPLAINT:  Chief Complaint  Patient presents with  . Ovarian Cancer   Oncology History   . 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  Adequate TRS, no macroscopic residual. IP/IV chemotherapy with DDP and paclitaxel on GOG protocol, chemo and Bev consolidation completed in 03/2011 2. 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 November 14, 2011- Patient was started on carboplatin and gemcitabine 4. Finished 6 cycles of chemotherapy in April of 2014 with carboplatin  and gemcitabine. Tolerance was fairly good except for neutropenia and thrombocytopenia. 5.recurrent disease by  CT scan February of 2015 6.radiation therapy to pelvis  May of 2015 7.upper extremity  . and deep vein thrombosis associated with port.(June of 2015) patient started on   [place] had port removed and had   thrombectomy September 06, 2013. 9.progressing disease in the right inguinal area 10.  Patient was started on Taxol and Avastin on a weekly basis 11.  Patient is   On chemotherapy holidays because of persistent urinary problem   12Recurrent carcinoma of fallopian tube.  Presently on observation        INTERVAL HISTORY:  82 year old lady with recurrent ovarian cancer treated with chemotherapy and radiation therapy to the right inguinal area.  Complains of swelling of the right lower extremity where patient had a significant injury.  Tenderness present.  No nausea.  No vomiting.  Patient continues to have problem with bladder being evaluated by Dr. Richrd Humbles, gynecologist.  No chills.  No fever.  No abdominal pain.  Patient had a GYN evaluation by Dr. Richrd Humbles.  Bladder problems are getting gradually better She has noticed some swelling in the right inguinal area.  Here for further follow-up and treatment consideration  REVIEW OF SYSTEMS:   GENERAL:  Feels good.  Active.  No fevers, sweats or weight loss. Recent had a recent fall  PERFORMANCE STATUS (ECOG):01 HEENT:  No visual changes, runny nose, sore throat, mouth sores or tenderness. Lungs: No shortness of breath or cough.  No hemoptysis. Cardiac:  No chest pain, palpitations, orthopnea, or PND. GI:  No nausea, vomiting, diarrhea, constipation, melena or hematochezia. GU:  Continues to have incontinence of urine.  Intermittent burning.  Recently had cystoscopy which revealed no evidence of malignancy.  Musculoskeletal:  No back pain.  No joint pain.  No muscle tenderness.Swelling of the right lower extremity  ecchymosis.  To follow  Extremities:  No pain or swelling. Skin:  No rashes or skin changes. Neuro:  No headache, numbness or weakness, balance or coordination issues. Endocrine:  No diabetes, thyroid issues, hot flashes or night sweats. Psych:  No mood changes, depression or anxiety. Pain:  No focal pain. Review of systems:  All other systems reviewed and found to be negative. As per HPI. Otherwise, a complete review of systems is negatve.    Smoking History: Smoking History Never Smoked.  PFSH: Family History: noncontributory  Comments: no maligancies  Social History: negative alcohol, negative tobacco  Additional Past Medical and Surgical History: Med:   HTN for 9 years             celiac disease, not active,              seasonal allergies              arthritis of the small joints,  TIA 20 years ago no further symptoms.    Mitral wall prolapse without any significant cardiac arrhythmias or congestive heart failure.    Surg:     1963  hysterectomy for descensus               1996  cholecystectomy through the scope,               2000  sling procedure for incontinence (Dr. Kathyrn Lass)   [place]:  Patient does have advance healthcare directive, Patient   does not desire to make any changes HEALTH MAINTENANCE: Social History  Substance Use Topics  . Smoking status: Never Smoker   . Smokeless tobacco: None  . Alcohol Use: No  Allergies  Allergen Reactions  . Benadryl [Diphenhydramine] Shortness Of Breath    Sob, rash, swelling  . Alendronate Sodium     Other reaction(s): Other (See Comments) Dysphagia  . Duloxetine Hcl Diarrhea  . Risedronate Sodium Rash    Aching, dysphagia  . Nitrofurantoin Other (See Comments)  . Lovastatin     Other reaction(s): Other (See Comments) GI upset  . Sulfa Antibiotics Rash     OBJECTIVE:  Filed Vitals:   03/02/15 1002  BP: 120/71  Pulse: 66  Temp: 96.3 F (35.7 C)  Resp: 18     Body mass index is 27.44  kg/(m^2).    ECOG FS:1 - Symptomatic but completely ambulatory  PHYSICAL EXAM: General  status: Performance status is good.  Patient has not lost significant weight HEENT: No evidence of stomatitis. Sclera and conjunctivae :: No jaundice.   pale looking. Lungs: Air  entry equal on both sides.  No rhonchi.  No rales.  Cardiac: Heart sounds are normal.  No pericardial rub.  No murmur. Lymphatic system: Cervical, axillary, inguinal, lymph nodes not palpable Right inguinal lymph node has increased in size  GI: Abdomen is soft.  No ascites.  Liver spleen not palpable.  No tenderness.  Bowel sounds are within normal limit Lower extremity: Right lower extremity ecchymosis swelling.  Is improved but tenderness persist Neurological system: Higher functions, cranial nerves intact no evidence of peripheral neuropathy. Skin: No rash.  No ecchymosis.Marland Kitchen\   LAB RESULTS:  Infusion on 03/02/2015  Component Date Value Ref Range Status  . WBC 03/02/2015 4.3  3.6 - 11.0 K/uL Final  . RBC 03/02/2015 3.68* 3.80 - 5.20 MIL/uL Final  . Hemoglobin 03/02/2015 10.5* 12.0 - 16.0 g/dL Final  . HCT 03/02/2015 31.9* 35.0 - 47.0 % Final  . MCV 03/02/2015 86.9  80.0 - 100.0 fL Final  . MCH 03/02/2015 28.5  26.0 - 34.0 pg Final  . MCHC 03/02/2015 32.7  32.0 - 36.0 g/dL Final  . RDW 03/02/2015 16.5* 11.5 - 14.5 % Final  . Platelets 03/02/2015 509* 150 - 440 K/uL Final  . Neutrophils Relative % 03/02/2015 48   Final  . Neutro Abs 03/02/2015 2.1  1.4 - 6.5 K/uL Final  . Lymphocytes Relative 03/02/2015 36   Final  . Lymphs Abs 03/02/2015 1.5  1.0 - 3.6 K/uL Final  . Monocytes Relative 03/02/2015 14   Final  . Monocytes Absolute 03/02/2015 0.6  0.2 - 0.9 K/uL Final  . Eosinophils Relative 03/02/2015 1   Final  . Eosinophils Absolute 03/02/2015 0.0  0 - 0.7 K/uL Final  . Basophils Relative 03/02/2015 1   Final  . Basophils Absolute 03/02/2015 0.1  0 - 0.1 K/uL Final  . Sodium 03/02/2015 136  135 - 145 mmol/L Final  .  Potassium 03/02/2015 3.6  3.5 - 5.1 mmol/L Final  . Chloride 03/02/2015 105  101 - 111 mmol/L Final  . CO2 03/02/2015 25  22 - 32 mmol/L Final  . Glucose, Bld 03/02/2015 82  65 - 99 mg/dL Final  . BUN 03/02/2015 21* 6 - 20 mg/dL Final  . Creatinine, Ser 03/02/2015 0.88  0.44 - 1.00 mg/dL Final  . Calcium 03/02/2015 8.5* 8.9 - 10.3 mg/dL Final  . Total Protein 03/02/2015 6.9  6.5 - 8.1 g/dL Final  . Albumin 03/02/2015 3.4* 3.5 - 5.0 g/dL Final  . AST 03/02/2015 16  15 - 41 U/L Final  . ALT 03/02/2015 11* 14 - 54 U/L Final  . Alkaline Phosphatase  03/02/2015 67  38 - 126 U/L Final  . Total Bilirubin 03/02/2015 0.3  0.3 - 1.2 mg/dL Final  . GFR calc non Af Amer 03/02/2015 59* >60 mL/min Final  . GFR calc Af Amer 03/02/2015 >60  >60 mL/min Final   Comment: (NOTE) The eGFR has been calculated using the CKD EPI equation. This calculation has not been validated in all clinical situations. eGFR's persistently <60 mL/min signify possible Chronic Kidney Disease.   . Anion gap 03/02/2015 6  5 - 15 Final  . Magnesium 03/02/2015 1.9  1.7 - 2.4 mg/dL Final  . CA 125 03/02/2015 29.4  0.0 - 38.1 U/mL Final   Comment: (NOTE) Roche ECLIA methodology Performed At: [facility] [place], [place] [id number] Lindon Romp MD [id number]   Appointment on 03/02/2015  Component Date Value Ref Range Status  . Total Protein, Urine 03/02/2015 9   Final   NO NORMAL RANGE ESTABLISHED FOR THIS TEST   Component     Latest Ref Rng 09/03/2013 11/01/2013 01/11/2014 02/10/2014 02/24/2014  CA 125     0.0 - 38.1 U/mL 37.8 (H) 59.4 (H) 59.6 (H) 32.1 27.9   Component     Latest Ref Rng 05/12/2014 06/02/2014 08/18/2014 10/13/2014  CA 125     0.0 - 38.1 U/mL 30.7 30.3 28.1 21.4   Dx:  Carcinoma of fallopian tube, unspecif...          Ref Range 2d ago    CA 125 0.0 - 38.1 U/mL 26.2   Comments: (NOTE)              ASSESSMENT: 82 year old lady with recurrent ovarian  cancer. Patient has not recurrent and progressive disease now responding to chemotherapy by tumor markers as well as clinical examination Neuropathy is a problem After next cycle of chemotherapy will reevaluate with CT scan and assess neuropathy  disease MEDICAL DECISION MAKING:  Patient is recurrent and progressive ovarian cancer with increase in the size of the right inguinal lymph node  Progressive neuropathy would be carefully monitored.  If needed treatment would be changed  Patient expressed understanding and was in agreement with this plan. She also understands that She can call clinic at any time with any questions, concerns, or complaints.    No matching staging information was found for the patient.  Forest Gleason, MD   03/03/2015 7:30 AM  MEDICAL DECISION MAKING:  Patient is recurrent and progressive ovarian cancer with increase in the size of the right inguinal lymph node  And tolerated first cycle of chemotherapy without any significant side effect.  Right inguinal lymph node is decrease in size.  All lab data has been reviewed.  Continue Taxol day 8.  Patient will get day 15 off. All lab data has been reviewed Patient expressed understanding and was in agreement with this plan. She also understands that She can call clinic at any time with any questions, concerns, or complaints.    No matching staging information was found for the patient.  Forest Gleason, MD   03/03/2015 7:26 AM

## 2015-03-06 ENCOUNTER — Telehealth: Payer: Self-pay | Admitting: *Deleted

## 2015-03-06 MED ORDER — CIPROFLOXACIN HCL 500 MG PO TABS: 500.0000 mg | ORAL_TABLET | Freq: Two times a day (BID) | ORAL | Status: DC

## 2015-03-06 NOTE — Telephone Encounter (Signed)
Called to request a refill on her Cipro given this past summer for a UTI or if she needs to go through the urologist for it. I called her to ask what sx she has painful urination, difficult to get started voiding when she goes. Denies visible blood, but it does smell. Reports that she had a treatment Friday

## 2015-03-06 NOTE — Telephone Encounter (Signed)
Per Dr Oliva Bustard, Cipro 500 mg q 12 h times 10 days e scribed and pt requested it be sent to [facility]

## 2015-03-09 ENCOUNTER — Inpatient Hospital Stay: Payer: Medicare HMO | Attending: Oncology

## 2015-03-09 ENCOUNTER — Inpatient Hospital Stay: Payer: Medicare HMO

## 2015-03-09 VITALS — BP 128/80 | HR 60 | Temp 97.0°F | Resp 18

## 2015-03-09 DIAGNOSIS — G629 Polyneuropathy, unspecified: Secondary | ICD-10-CM | POA: Diagnosis not present

## 2015-03-09 DIAGNOSIS — Z8543 Personal history of malignant neoplasm of ovary: Secondary | ICD-10-CM | POA: Insufficient documentation

## 2015-03-09 DIAGNOSIS — Z79899 Other long term (current) drug therapy: Secondary | ICD-10-CM | POA: Insufficient documentation

## 2015-03-09 DIAGNOSIS — R599 Enlarged lymph nodes, unspecified: Secondary | ICD-10-CM | POA: Diagnosis not present

## 2015-03-09 DIAGNOSIS — Z923 Personal history of irradiation: Secondary | ICD-10-CM | POA: Diagnosis not present

## 2015-03-09 DIAGNOSIS — Z5111 Encounter for antineoplastic chemotherapy: Secondary | ICD-10-CM | POA: Diagnosis present

## 2015-03-09 DIAGNOSIS — Z9181 History of falling: Secondary | ICD-10-CM | POA: Diagnosis not present

## 2015-03-09 DIAGNOSIS — Z9223 Personal history of estrogen therapy: Secondary | ICD-10-CM | POA: Diagnosis not present

## 2015-03-09 DIAGNOSIS — Z86718 Personal history of other venous thrombosis and embolism: Secondary | ICD-10-CM | POA: Diagnosis not present

## 2015-03-09 DIAGNOSIS — Z9221 Personal history of antineoplastic chemotherapy: Secondary | ICD-10-CM | POA: Insufficient documentation

## 2015-03-09 DIAGNOSIS — R32 Unspecified urinary incontinence: Secondary | ICD-10-CM | POA: Diagnosis not present

## 2015-03-09 DIAGNOSIS — Z87440 Personal history of urinary (tract) infections: Secondary | ICD-10-CM | POA: Diagnosis not present

## 2015-03-09 DIAGNOSIS — C569 Malignant neoplasm of unspecified ovary: Secondary | ICD-10-CM

## 2015-03-09 DIAGNOSIS — Z5112 Encounter for antineoplastic immunotherapy: Secondary | ICD-10-CM | POA: Diagnosis not present

## 2015-03-09 DIAGNOSIS — I10 Essential (primary) hypertension: Secondary | ICD-10-CM | POA: Diagnosis not present

## 2015-03-09 DIAGNOSIS — C774 Secondary and unspecified malignant neoplasm of inguinal and lower limb lymph nodes: Secondary | ICD-10-CM | POA: Insufficient documentation

## 2015-03-09 DIAGNOSIS — Z7901 Long term (current) use of anticoagulants: Secondary | ICD-10-CM | POA: Insufficient documentation

## 2015-03-09 DIAGNOSIS — C5700 Malignant neoplasm of unspecified fallopian tube: Secondary | ICD-10-CM

## 2015-03-09 DIAGNOSIS — N329 Bladder disorder, unspecified: Secondary | ICD-10-CM | POA: Insufficient documentation

## 2015-03-09 DIAGNOSIS — Z8673 Personal history of transient ischemic attack (TIA), and cerebral infarction without residual deficits: Secondary | ICD-10-CM | POA: Insufficient documentation

## 2015-03-09 LAB — COMPREHENSIVE METABOLIC PANEL
ALT: 11 U/L — AB (ref 14–54)
AST: 16 U/L (ref 15–41)
Albumin: 3.4 g/dL — ABNORMAL LOW (ref 3.5–5.0)
Alkaline Phosphatase: 61 U/L (ref 38–126)
Anion gap: 6 (ref 5–15)
BUN: 21 mg/dL — ABNORMAL HIGH (ref 6–20)
CHLORIDE: 106 mmol/L (ref 101–111)
CO2: 25 mmol/L (ref 22–32)
CREATININE: 0.94 mg/dL (ref 0.44–1.00)
Calcium: 8.5 mg/dL — ABNORMAL LOW (ref 8.9–10.3)
GFR calc non Af Amer: 54 mL/min — ABNORMAL LOW (ref >60)
Glucose, Bld: 97 mg/dL (ref 65–99)
POTASSIUM: 3.5 mmol/L (ref 3.5–5.1)
SODIUM: 137 mmol/L (ref 135–145)
Total Bilirubin: 0.3 mg/dL (ref 0.3–1.2)
Total Protein: 6.6 g/dL (ref 6.5–8.1)

## 2015-03-09 LAB — CBC WITH DIFFERENTIAL/PLATELET
BASOS ABS: 0.1 10*3/uL (ref 0–0.1)
Basophils Relative: 1 %
EOS ABS: 0.1 10*3/uL (ref 0–0.7)
EOS PCT: 1 %
HCT: 32.2 % — ABNORMAL LOW (ref 35.0–47.0)
Hemoglobin: 10.4 g/dL — ABNORMAL LOW (ref 12.0–16.0)
LYMPHS ABS: 1.3 10*3/uL (ref 1.0–3.6)
Lymphocytes Relative: 31 %
MCH: 28 pg (ref 26.0–34.0)
MCHC: 32.4 g/dL (ref 32.0–36.0)
MCV: 86.3 fL (ref 80.0–100.0)
Monocytes Absolute: 0.4 10*3/uL (ref 0.2–0.9)
Monocytes Relative: 10 %
Neutro Abs: 2.3 10*3/uL (ref 1.4–6.5)
Neutrophils Relative %: 57 %
PLATELETS: 425 10*3/uL (ref 150–440)
RBC: 3.73 MIL/uL — AB (ref 3.80–5.20)
RDW: 15.9 % — ABNORMAL HIGH (ref 11.5–14.5)
WBC: 4.2 10*3/uL (ref 3.6–11.0)

## 2015-03-09 MED ORDER — SODIUM CHLORIDE 0.9 % IV SOLN
Freq: Once | INTRAVENOUS | Status: AC
  Administered 2015-03-09: 11:00:00 via INTRAVENOUS
  Filled 2015-03-09: qty 1000

## 2015-03-09 MED ORDER — DEXTROSE 5 % IV SOLN
80.0000 mg/m2 | Freq: Once | INTRAVENOUS | Status: AC
  Administered 2015-03-09: 132 mg via INTRAVENOUS
  Filled 2015-03-09: qty 22

## 2015-03-09 MED ORDER — FAMOTIDINE IN NACL 20-0.9 MG/50ML-% IV SOLN
20.0000 mg | Freq: Once | INTRAVENOUS | Status: AC
  Administered 2015-03-09: 20 mg via INTRAVENOUS
  Filled 2015-03-09: qty 50

## 2015-03-09 MED ORDER — DEXAMETHASONE SODIUM PHOSPHATE 100 MG/10ML IJ SOLN
Freq: Once | INTRAMUSCULAR | Status: AC
  Administered 2015-03-09: 11:00:00 via INTRAVENOUS
  Filled 2015-03-09: qty 8

## 2015-03-09 MED ORDER — HEPARIN SOD (PORK) LOCK FLUSH 100 UNIT/ML IV SOLN
500.0000 [IU] | Freq: Once | INTRAVENOUS | Status: AC | PRN
  Administered 2015-03-09: 500 [IU]
  Filled 2015-03-09: qty 5

## 2015-03-09 MED ORDER — SODIUM CHLORIDE 0.9 % IJ SOLN
10.0000 mL | INTRAMUSCULAR | Status: DC | PRN
  Administered 2015-03-09: 10 mL
  Filled 2015-03-09: qty 10

## 2015-03-10 ENCOUNTER — Ambulatory Visit: Payer: Medicare Other | Admitting: Obstetrics and Gynecology

## 2015-03-23 ENCOUNTER — Encounter: Payer: Self-pay | Admitting: Oncology

## 2015-03-23 ENCOUNTER — Inpatient Hospital Stay: Payer: Medicare HMO

## 2015-03-23 ENCOUNTER — Inpatient Hospital Stay (HOSPITAL_BASED_OUTPATIENT_CLINIC_OR_DEPARTMENT_OTHER): Payer: Medicare HMO | Admitting: Oncology

## 2015-03-23 ENCOUNTER — Other Ambulatory Visit: Payer: Self-pay | Admitting: *Deleted

## 2015-03-23 VITALS — BP 128/68 | HR 66 | Temp 95.5°F | Resp 18 | Wt 141.6 lb

## 2015-03-23 DIAGNOSIS — Z9223 Personal history of estrogen therapy: Secondary | ICD-10-CM

## 2015-03-23 DIAGNOSIS — C5700 Malignant neoplasm of unspecified fallopian tube: Secondary | ICD-10-CM | POA: Diagnosis not present

## 2015-03-23 DIAGNOSIS — C569 Malignant neoplasm of unspecified ovary: Secondary | ICD-10-CM

## 2015-03-23 DIAGNOSIS — Z9221 Personal history of antineoplastic chemotherapy: Secondary | ICD-10-CM | POA: Diagnosis not present

## 2015-03-23 DIAGNOSIS — Z7901 Long term (current) use of anticoagulants: Secondary | ICD-10-CM

## 2015-03-23 DIAGNOSIS — C774 Secondary and unspecified malignant neoplasm of inguinal and lower limb lymph nodes: Secondary | ICD-10-CM

## 2015-03-23 DIAGNOSIS — N329 Bladder disorder, unspecified: Secondary | ICD-10-CM

## 2015-03-23 DIAGNOSIS — Z86718 Personal history of other venous thrombosis and embolism: Secondary | ICD-10-CM

## 2015-03-23 DIAGNOSIS — Z8543 Personal history of malignant neoplasm of ovary: Secondary | ICD-10-CM

## 2015-03-23 DIAGNOSIS — R32 Unspecified urinary incontinence: Secondary | ICD-10-CM

## 2015-03-23 DIAGNOSIS — Z5111 Encounter for antineoplastic chemotherapy: Secondary | ICD-10-CM | POA: Diagnosis not present

## 2015-03-23 DIAGNOSIS — R599 Enlarged lymph nodes, unspecified: Secondary | ICD-10-CM

## 2015-03-23 DIAGNOSIS — Z923 Personal history of irradiation: Secondary | ICD-10-CM | POA: Diagnosis not present

## 2015-03-23 DIAGNOSIS — G629 Polyneuropathy, unspecified: Secondary | ICD-10-CM

## 2015-03-23 LAB — COMPREHENSIVE METABOLIC PANEL
ALK PHOS: 60 U/L (ref 38–126)
ALT: 10 U/L — AB (ref 14–54)
AST: 17 U/L (ref 15–41)
Albumin: 3.4 g/dL — ABNORMAL LOW (ref 3.5–5.0)
Anion gap: 5 (ref 5–15)
BUN: 23 mg/dL — AB (ref 6–20)
CALCIUM: 8.4 mg/dL — AB (ref 8.9–10.3)
CO2: 25 mmol/L (ref 22–32)
CREATININE: 0.9 mg/dL (ref 0.44–1.00)
Chloride: 106 mmol/L (ref 101–111)
GFR calc Af Amer: >60 mL/min (ref >60)
GFR calc non Af Amer: 57 mL/min — ABNORMAL LOW (ref >60)
Glucose, Bld: 113 mg/dL — ABNORMAL HIGH (ref 65–99)
Potassium: 4 mmol/L (ref 3.5–5.1)
SODIUM: 136 mmol/L (ref 135–145)
Total Bilirubin: 0.4 mg/dL (ref 0.3–1.2)
Total Protein: 6.7 g/dL (ref 6.5–8.1)

## 2015-03-23 LAB — MAGNESIUM: MAGNESIUM: 1.9 mg/dL (ref 1.7–2.4)

## 2015-03-23 LAB — CBC WITH DIFFERENTIAL/PLATELET
BASOS ABS: 0.1 10*3/uL (ref 0–0.1)
BASOS PCT: 2 %
Eosinophils Absolute: 0 10*3/uL (ref 0–0.7)
Eosinophils Relative: 1 %
HEMATOCRIT: 30.6 % — AB (ref 35.0–47.0)
HEMOGLOBIN: 10.2 g/dL — AB (ref 12.0–16.0)
Lymphocytes Relative: 38 %
Lymphs Abs: 1.7 10*3/uL (ref 1.0–3.6)
MCH: 28.9 pg (ref 26.0–34.0)
MCHC: 33.3 g/dL (ref 32.0–36.0)
MCV: 87 fL (ref 80.0–100.0)
Monocytes Absolute: 0.6 10*3/uL (ref 0.2–0.9)
Monocytes Relative: 15 %
NEUTROS ABS: 2 10*3/uL (ref 1.4–6.5)
NEUTROS PCT: 44 %
Platelets: 504 10*3/uL — ABNORMAL HIGH (ref 150–440)
RBC: 3.51 MIL/uL — AB (ref 3.80–5.20)
RDW: 17.4 % — AB (ref 11.5–14.5)
WBC: 4.4 10*3/uL (ref 3.6–11.0)

## 2015-03-23 LAB — PROTEIN, URINE, RANDOM: Total Protein, Urine: 14 mg/dL

## 2015-03-23 MED ORDER — SODIUM CHLORIDE 0.9 % IV SOLN
Freq: Once | INTRAVENOUS | Status: AC
  Administered 2015-03-23: 12:00:00 via INTRAVENOUS
  Filled 2015-03-23: qty 1000

## 2015-03-23 MED ORDER — HEPARIN SOD (PORK) LOCK FLUSH 100 UNIT/ML IV SOLN
500.0000 [IU] | Freq: Once | INTRAVENOUS | Status: AC
  Administered 2015-03-23: 500 [IU] via INTRAVENOUS
  Filled 2015-03-23: qty 5

## 2015-03-23 MED ORDER — SODIUM CHLORIDE 0.9 % IV SOLN
10.0000 mg/kg | Freq: Once | INTRAVENOUS | Status: AC
  Administered 2015-03-23: 625 mg via INTRAVENOUS
  Filled 2015-03-23: qty 20

## 2015-03-23 MED ORDER — SODIUM CHLORIDE 0.9% FLUSH
10.0000 mL | Freq: Once | INTRAVENOUS | Status: AC
  Administered 2015-03-23: 10 mL via INTRAVENOUS
  Filled 2015-03-23: qty 10

## 2015-03-23 NOTE — Progress Notes (Signed)
Nicole Clayton @ [facility] [phone]  Fax:([phone]) [place] OB: 1928-03-06  MR#: [id number]  NGE#:[id number]  Patient Care Team: Ezequiel Kayser, [name]  CHIEF COMPLAINT:  Chief Complaint  Patient presents with  . Ovarian Cancer   Oncology History   . 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  Adequate TRS, no macroscopic residual. IP/IV chemotherapy with DDP and paclitaxel on GOG protocol, chemo and Bev consolidation completed in 03/2011 2. 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 November 14, 2011- Patient was started on carboplatin and gemcitabine 4. Finished 6 cycles of chemotherapy in April of 2014 with carboplatin and gemcitabine. Tolerance was fairly good except for neutropenia and thrombocytopenia. 5.recurrent disease by CT scan February of 2015 6.radiation therapy to pelvis  May of 2015 7.upper extremity  . and deep vein thrombosis associated with port.(June of 2015) patient started on   [place] had port removed and had   thrombectomy September 06, 2013. 9.progressing disease in the right inguinal area 10.  Patient was started on Taxol and Avastin on a weekly basis 11.  Patient is   On chemotherapy holidays because of persistent urinary problem   12Recurrent carcinoma of fallopian tube.  Presently on observation    13.  Progressive right inguinal lymph node enlargement so patient was started on Taxol and Avastin (January, 2016) 14.  Somatic mutation for BRCA1 was negative (February, 2017) Germline mutation for BRCA1 was negative in the past    INTERVAL HISTORY:  82 year old lady with recurrent ovarian cancer treated with chemotherapy and radiation therapy to the right inguinal area.  Complains of swelling of the right lower extremity where patient had a significant injury.  Tenderness present.  No nausea.  No vomiting.  Patient  continues to have problem with bladder being evaluated by Dr. Richrd Humbles, gynecologist.  No chills.  No fever.  No abdominal pain. She has problem with numbness and tingling in both lower extremity and upper extremity which continues to bother the patient in spite of taking Neurontin.  REVIEW OF SYSTEMS:   GENERAL:  Feels good.  Active.  No fevers, sweats or weight loss. Recent had a recent fall  PERFORMANCE STATUS (ECOG):01 HEENT:  No visual changes, runny nose, sore throat, mouth sores or tenderness. Lungs: No shortness of breath or cough.  No hemoptysis. Cardiac:  No chest pain, palpitations, orthopnea, or PND. GI:  No nausea, vomiting, diarrhea, constipation, melena or hematochezia. GU:  Continues to have incontinence of urine.  Intermittent burning.  Recently had cystoscopy which revealed no evidence of malignancy.  Musculoskeletal:  No back pain.  No joint pain.  No muscle tenderness.Swelling of the right lower extremity ecchymosis.  To follow  Extremities:  No pain or swelling. Skin:  No rashes or skin changes. Neuro:  No headache, numbness or weakness, balance or coordination issues. Endocrine:  No diabetes, thyroid issues, hot flashes or night sweats. Psych:  No mood changes, depression or anxiety. Pain:  No focal pain. Review of systems:  All other systems reviewed and found to be negative. As per HPI. Otherwise, a complete review of systems is negatve.    Smoking History: Smoking History Never Smoked.  PFSH: Family History: noncontributory  Comments: no maligancies  Social History: negative alcohol, negative tobacco  Additional Past Medical and Surgical History: Med:   HTN for 9 years  celiac disease, not active,              seasonal allergies              arthritis of the small joints,  TIA 20 years ago no further symptoms.    Mitral wall prolapse without any significant cardiac arrhythmias or congestive heart failure.    Surg:     1963  hysterectomy for  descensus               1996  cholecystectomy through the scope,               2000  sling procedure for incontinence (Dr. Kathyrn Lass)   [place]:  Patient does have advance healthcare directive, Patient   does not desire to make any changes HEALTH MAINTENANCE: Social History  Substance Use Topics  . Smoking status: Never Smoker   . Smokeless tobacco: None  . Alcohol Use: No      Allergies  Allergen Reactions  . Benadryl [Diphenhydramine] Shortness Of Breath    Sob, rash, swelling  . Alendronate Sodium     Other reaction(s): Other (See Comments) Dysphagia  . Duloxetine Hcl Diarrhea  . Risedronate Sodium Rash    Aching, dysphagia  . Nitrofurantoin Other (See Comments)  . Lovastatin     Other reaction(s): Other (See Comments) GI upset  . Sulfa Antibiotics Rash     OBJECTIVE:  Filed Vitals:   03/23/15 1103  BP: 128/68  Pulse: 66  Temp: 95.5 F (35.3 C)  Resp: 18     Body mass index is 27.65 kg/(m^2).    ECOG FS:1 - Symptomatic but completely ambulatory  PHYSICAL EXAM: General  status: Performance status is good.  Patient has not lost significant weight HEENT: No evidence of stomatitis. Sclera and conjunctivae :: No jaundice.   pale looking. Lungs: Air  entry equal on both sides.  No rhonchi.  No rales.  Cardiac: Heart sounds are normal.  No pericardial rub.  No murmur. Lymphatic system: Cervical, axillary, inguinal, lymph nodes not palpable Right inguinal lymph node has increased in size  GI: Abdomen is soft.  No ascites.  Liver spleen not palpable.  No tenderness.  Bowel sounds are within normal limit Lower extremity: Right lower extremity ecchymosis swelling.  Is improved but tenderness persist Neurological system: Higher functions, cranial nerves intact no evidence of peripheral neuropathy. Skin: No rash.  No ecchymosis.Marland Kitchen\   LAB RESULTS:  Infusion on 03/23/2015  Component Date Value Ref Range Status  . WBC 03/23/2015 4.4  3.6 - 11.0 K/uL Final    . RBC 03/23/2015 3.51* 3.80 - 5.20 MIL/uL Final  . Hemoglobin 03/23/2015 10.2* 12.0 - 16.0 g/dL Final  . HCT 03/23/2015 30.6* 35.0 - 47.0 % Final  . MCV 03/23/2015 87.0  80.0 - 100.0 fL Final  . MCH 03/23/2015 28.9  26.0 - 34.0 pg Final  . MCHC 03/23/2015 33.3  32.0 - 36.0 g/dL Final  . RDW 03/23/2015 17.4* 11.5 - 14.5 % Final  . Platelets 03/23/2015 504* 150 - 440 K/uL Final  . Neutrophils Relative % 03/23/2015 44   Final  . Neutro Abs 03/23/2015 2.0  1.4 - 6.5 K/uL Final  . Lymphocytes Relative 03/23/2015 38   Final  . Lymphs Abs 03/23/2015 1.7  1.0 - 3.6 K/uL Final  . Monocytes Relative 03/23/2015 15   Final  . Monocytes Absolute 03/23/2015 0.6  0.2 - 0.9 K/uL Final  . Eosinophils Relative 03/23/2015 1   Final  .  Eosinophils Absolute 03/23/2015 0.0  0 - 0.7 K/uL Final  . Basophils Relative 03/23/2015 2   Final  . Basophils Absolute 03/23/2015 0.1  0 - 0.1 K/uL Final  . Magnesium 03/23/2015 1.9  1.7 - 2.4 mg/dL Final  . Sodium 03/23/2015 136  135 - 145 mmol/L Final  . Potassium 03/23/2015 4.0  3.5 - 5.1 mmol/L Final  . Chloride 03/23/2015 106  101 - 111 mmol/L Final  . CO2 03/23/2015 25  22 - 32 mmol/L Final  . Glucose, Bld 03/23/2015 113* 65 - 99 mg/dL Final  . BUN 03/23/2015 23* 6 - 20 mg/dL Final  . Creatinine, Ser 03/23/2015 0.90  0.44 - 1.00 mg/dL Final  . Calcium 03/23/2015 8.4* 8.9 - 10.3 mg/dL Final  . Total Protein 03/23/2015 6.7  6.5 - 8.1 g/dL Final  . Albumin 03/23/2015 3.4* 3.5 - 5.0 g/dL Final  . AST 03/23/2015 17  15 - 41 U/L Final  . ALT 03/23/2015 10* 14 - 54 U/L Final  . Alkaline Phosphatase 03/23/2015 60  38 - 126 U/L Final  . Total Bilirubin 03/23/2015 0.4  0.3 - 1.2 mg/dL Final  . GFR calc non Af Amer 03/23/2015 57* >60 mL/min Final  . GFR calc Af Amer 03/23/2015 >60  >60 mL/min Final   Comment: (NOTE) The eGFR has been calculated using the CKD EPI equation. This calculation has not been validated in all clinical situations. eGFR's persistently <60  mL/min signify possible Chronic Kidney Disease.   . Anion gap 03/23/2015 5  5 - 15 Final     CA 125 (last week) was 44.9 which is rising from previous 1 month ago which was 26.2     ASSESSMENT: 82 year old lady with recurrent ovarian cancer. CT scan and clinical examination shows progressive disease. Start patient on Taxol and Avastin therapy because of progressive swelling of right lower extremity as well as pain by tumor markers.  Nicole Clayton @ [facility] Telephone:([phone]  Fax:(336) [place] OB: 1928/09/10  MR#: [id number]  JYN#:[id number]  Patient Care Team: Ezequiel Kayser, [name]  CHIEF COMPLAINT:  Chief Complaint  Patient presents with  . Ovarian Cancer   Oncology History   . 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  Adequate TRS, no macroscopic residual. IP/IV chemotherapy with DDP and paclitaxel on GOG protocol, chemo and Bev consolidation completed in 03/2011 2. 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 November 14, 2011- Patient was started on carboplatin and gemcitabine 4. Finished 6 cycles of chemotherapy in April of 2014 with carboplatin and gemcitabine. Tolerance was fairly good except for neutropenia and thrombocytopenia. 5.recurrent disease by CT scan February of 2015 6.radiation therapy to pelvis  May of 2015 7.upper extremity  . and deep vein thrombosis associated with port.(June of 2015) patient started on   [place] had port removed and had   thrombectomy September 06, 2013. 9.progressing disease in the right inguinal area 10.  Patient was started on Taxol and Avastin on a weekly basis 11.  Patient is   On chemotherapy holidays because of persistent urinary problem   12Recurrent carcinoma of fallopian tube.  Presently on observation   13.  Because of persistent and progressive neuropathy which is becoming in tolerable we  have decided to quit Taxol at present time and continue Avastin (March 24, 2015)      INTERVAL HISTORY:  82 year old lady with  recurrent ovarian cancer treated with chemotherapy and radiation therapy to the right inguinal area.  Complains of swelling of the right lower extremity where patient had a significant injury.  Tenderness present.  No nausea.  No vomiting.  Patient continues to have problem with bladder being evaluated by Dr. Richrd Humbles, gynecologist.  No chills.  No fever.  No abdominal pain.  Patient had a GYN evaluation by Dr. Richrd Humbles.  Bladder problems are getting gradually better She has noticed some swelling in the right inguinal area.  Here for further follow-up and treatment consideration Recurrent urinary tract infection  REVIEW OF SYSTEMS:   GENERAL:  Feels good.  Active.  No fevers, sweats or weight loss. Recent had a recent fall  PERFORMANCE STATUS (ECOG):01 HEENT:  No visual changes, runny nose, sore throat, mouth sores or tenderness. Lungs: No shortness of breath or cough.  No hemoptysis. Cardiac:  No chest pain, palpitations, orthopnea, or PND. GI:  No nausea, vomiting, diarrhea, constipation, melena or hematochezia. GU:  Continues to have incontinence of urine.  Intermittent burning.  Recently had cystoscopy which revealed no evidence of malignancy.  Musculoskeletal:  No back pain.  No joint pain.  No muscle tenderness.Swelling of the right lower extremity ecchymosis.  To follow  Extremities:  No pain or swelling. Skin:  No rashes or skin changes. Neuro:  No headache, numbness or weakness, balance or coordination issues. Endocrine:  No diabetes, thyroid issues, hot flashes or night sweats. Psych:  No mood changes, depression or anxiety. Pain:  No focal pain. Review of systems:  All other systems reviewed and found to be negative. As per HPI. Otherwise, a complete review of systems is negatve.    Smoking History: Smoking History Never  Smoked.  PFSH: Family History: noncontributory  Comments: no maligancies  Social History: negative alcohol, negative tobacco  Additional Past Medical and Surgical History: Med:   HTN for 9 years             celiac disease, not active,              seasonal allergies              arthritis of the small joints,  TIA 20 years ago no further symptoms.    Mitral wall prolapse without any significant cardiac arrhythmias or congestive heart failure.    Surg:     1963  hysterectomy for descensus               1996  cholecystectomy through the scope,               2000  sling procedure for incontinence (Dr. Kathyrn Lass)   Nicole [place]:  Patient does have advance healthcare directive, Patient   does not desire to make any changes HEALTH MAINTENANCE: Social History  Substance Use Topics  . Smoking status: Never Smoker   . Smokeless tobacco: None  . Alcohol Use: No      Allergies  Allergen Reactions  . Benadryl [Diphenhydramine] Shortness Of Breath    Sob, rash, swelling  . Alendronate Sodium     Other reaction(s): Other (See Comments) Dysphagia  . Duloxetine Hcl Diarrhea  . Risedronate Sodium Rash    Aching, dysphagia  . Nitrofurantoin Other (See Comments)  . Lovastatin     Other reaction(s): Other (See Comments) GI upset  . Sulfa Antibiotics Rash     OBJECTIVE:  Filed Vitals:   03/23/15 1103  BP: 128/68  Pulse: 66  Temp: 95.5 F (  35.3 C)  Resp: 18     Body mass index is 27.65 kg/(m^2).    ECOG FS:1 - Symptomatic but completely ambulatory  PHYSICAL EXAM: General  status: Performance status is good.  Patient has not lost significant weight HEENT: No evidence of stomatitis. Sclera and conjunctivae :: No jaundice.   pale looking. Lungs: Air  entry equal on both sides.  No rhonchi.  No rales.  Cardiac: Heart sounds are normal.  No pericardial rub.  No murmur. Lymphatic system: Cervical, axillary, inguinal, lymph nodes not palpable Right inguinal lymph node has  increased in size  GI: Abdomen is soft.  No ascites.  Liver spleen not palpable.  No tenderness.  Bowel sounds are within normal limit Lower extremity: Right lower extremity ecchymosis swelling.  Is improved but tenderness persist Neurological system: Higher functions, cranial nerves intact no evidence of peripheral neuropathy. Skin: No rash.  No ecchymosis.Marland Kitchen\   LAB RESULTS:  Infusion on 03/23/2015  Component Date Value Ref Range Status  . WBC 03/23/2015 4.4  3.6 - 11.0 K/uL Final  . RBC 03/23/2015 3.51* 3.80 - 5.20 MIL/uL Final  . Hemoglobin 03/23/2015 10.2* 12.0 - 16.0 g/dL Final  . HCT 03/23/2015 30.6* 35.0 - 47.0 % Final  . MCV 03/23/2015 87.0  80.0 - 100.0 fL Final  . MCH 03/23/2015 28.9  26.0 - 34.0 pg Final  . MCHC 03/23/2015 33.3  32.0 - 36.0 g/dL Final  . RDW 03/23/2015 17.4* 11.5 - 14.5 % Final  . Platelets 03/23/2015 504* 150 - 440 K/uL Final  . Neutrophils Relative % 03/23/2015 44   Final  . Neutro Abs 03/23/2015 2.0  1.4 - 6.5 K/uL Final  . Lymphocytes Relative 03/23/2015 38   Final  . Lymphs Abs 03/23/2015 1.7  1.0 - 3.6 K/uL Final  . Monocytes Relative 03/23/2015 15   Final  . Monocytes Absolute 03/23/2015 0.6  0.2 - 0.9 K/uL Final  . Eosinophils Relative 03/23/2015 1   Final  . Eosinophils Absolute 03/23/2015 0.0  0 - 0.7 K/uL Final  . Basophils Relative 03/23/2015 2   Final  . Basophils Absolute 03/23/2015 0.1  0 - 0.1 K/uL Final  . Magnesium 03/23/2015 1.9  1.7 - 2.4 mg/dL Final  . Sodium 03/23/2015 136  135 - 145 mmol/L Final  . Potassium 03/23/2015 4.0  3.5 - 5.1 mmol/L Final  . Chloride 03/23/2015 106  101 - 111 mmol/L Final  . CO2 03/23/2015 25  22 - 32 mmol/L Final  . Glucose, Bld 03/23/2015 113* 65 - 99 mg/dL Final  . BUN 03/23/2015 23* 6 - 20 mg/dL Final  . Creatinine, Ser 03/23/2015 0.90  0.44 - 1.00 mg/dL Final  . Calcium 03/23/2015 8.4* 8.9 - 10.3 mg/dL Final  . Total Protein 03/23/2015 6.7  6.5 - 8.1 g/dL Final  . Albumin 03/23/2015 3.4* 3.5 - 5.0  g/dL Final  . AST 03/23/2015 17  15 - 41 U/L Final  . ALT 03/23/2015 10* 14 - 54 U/L Final  . Alkaline Phosphatase 03/23/2015 60  38 - 126 U/L Final  . Total Bilirubin 03/23/2015 0.4  0.3 - 1.2 mg/dL Final  . GFR calc non Af Amer 03/23/2015 57* >60 mL/min Final  . GFR calc Af Amer 03/23/2015 >60  >60 mL/min Final   Comment: (NOTE) The eGFR has been calculated using the CKD EPI equation. This calculation has not been validated in all clinical situations. eGFR's persistently <60 mL/min signify possible Chronic Kidney Disease.   Georgiann Hahn gap 03/23/2015  5  5 - 15 Final   Component     Latest Ref Rng 09/03/2013 11/01/2013 01/11/2014 02/10/2014 02/24/2014  CA 125     0.0 - 38.1 U/mL 37.8 (H) 59.4 (H) 59.6 (H) 32.1 27.9   Component     Latest Ref Rng 05/12/2014 06/02/2014 08/18/2014 10/13/2014  CA 125     0.0 - 38.1 U/mL 30.7 30.3 28.1 21.4   Dx:  Carcinoma of fallopian tube, unspecif...          Ref Range 2d ago    CA 125 0.0 - 38.1 U/mL 26.2   Comments: (NOTE)              ASSESSMENT: 82 year old lady with recurrent ovarian cancer. Patient has not recurrent and progressive disease now responding to chemotherapy by tumor markers as well as clinical examination Neuropathy is a problem After next cycle of chemotherapy will reevaluate with CT scan and assess neuropathy  disease MEDICAL DECISION MAKING:  Patient is recurrent and progressive ovarian cancer with increase in the size of the right inguinal lymph node  Progressive neuropathy would be carefully monitored.  If needed treatment would be changed  Patient expressed understanding and was in agreement with this plan. She also understands that She can call clinic at any time with any questions, concerns, or complaints.    No matching staging information was found for the patient.  Forest Gleason, MD   03/23/2015 12:01 PM  MEDICAL DECISION MAKING:  Patient is recurrent and progressive ovarian cancer with increase in the size  of the right inguinal lymph node  A marker shows declining level  Repeat tumor markers is pending  C during neuropathy which is progressive and interfering with quality of life.  Taxol continue Avastin and will make some decision about further chemotherapy option at some point in time  .  Patient would be reevaluated in 3 weeks .  was in agreement with this plan. She also understands that She can call clinic at any time with any questions, concerns, or complaints.    No matching staging information was found for the patient.  Forest Gleason, MD   03/23/2015 12:01 PM

## 2015-03-24 LAB — CA 125: CA 125: 28.8 U/mL (ref 0.0–38.1)

## 2015-03-31 ENCOUNTER — Encounter: Payer: Self-pay | Admitting: Oncology

## 2015-04-13 ENCOUNTER — Inpatient Hospital Stay: Payer: Medicare HMO | Attending: Oncology

## 2015-04-13 ENCOUNTER — Encounter: Payer: Self-pay | Admitting: Oncology

## 2015-04-13 ENCOUNTER — Inpatient Hospital Stay (HOSPITAL_BASED_OUTPATIENT_CLINIC_OR_DEPARTMENT_OTHER): Payer: Medicare HMO | Admitting: Oncology

## 2015-04-13 ENCOUNTER — Inpatient Hospital Stay: Payer: Medicare HMO

## 2015-04-13 VITALS — BP 136/83 | HR 66 | Temp 96.6°F | Resp 18 | Wt 138.0 lb

## 2015-04-13 VITALS — BP 132/78 | HR 62

## 2015-04-13 DIAGNOSIS — Z923 Personal history of irradiation: Secondary | ICD-10-CM | POA: Insufficient documentation

## 2015-04-13 DIAGNOSIS — I10 Essential (primary) hypertension: Secondary | ICD-10-CM | POA: Insufficient documentation

## 2015-04-13 DIAGNOSIS — N3020 Other chronic cystitis without hematuria: Secondary | ICD-10-CM

## 2015-04-13 DIAGNOSIS — Z5112 Encounter for antineoplastic immunotherapy: Secondary | ICD-10-CM | POA: Insufficient documentation

## 2015-04-13 DIAGNOSIS — Z86718 Personal history of other venous thrombosis and embolism: Secondary | ICD-10-CM | POA: Diagnosis not present

## 2015-04-13 DIAGNOSIS — Z9221 Personal history of antineoplastic chemotherapy: Secondary | ICD-10-CM | POA: Diagnosis not present

## 2015-04-13 DIAGNOSIS — R599 Enlarged lymph nodes, unspecified: Secondary | ICD-10-CM | POA: Insufficient documentation

## 2015-04-13 DIAGNOSIS — C569 Malignant neoplasm of unspecified ovary: Secondary | ICD-10-CM

## 2015-04-13 DIAGNOSIS — K900 Celiac disease: Secondary | ICD-10-CM

## 2015-04-13 DIAGNOSIS — C5700 Malignant neoplasm of unspecified fallopian tube: Secondary | ICD-10-CM

## 2015-04-13 DIAGNOSIS — M1990 Unspecified osteoarthritis, unspecified site: Secondary | ICD-10-CM | POA: Diagnosis not present

## 2015-04-13 DIAGNOSIS — Z8543 Personal history of malignant neoplasm of ovary: Secondary | ICD-10-CM | POA: Diagnosis not present

## 2015-04-13 DIAGNOSIS — I890 Lymphedema, not elsewhere classified: Secondary | ICD-10-CM | POA: Insufficient documentation

## 2015-04-13 DIAGNOSIS — C774 Secondary and unspecified malignant neoplasm of inguinal and lower limb lymph nodes: Secondary | ICD-10-CM | POA: Diagnosis not present

## 2015-04-13 DIAGNOSIS — F418 Other specified anxiety disorders: Secondary | ICD-10-CM

## 2015-04-13 DIAGNOSIS — Z8673 Personal history of transient ischemic attack (TIA), and cerebral infarction without residual deficits: Secondary | ICD-10-CM | POA: Insufficient documentation

## 2015-04-13 LAB — CBC WITH DIFFERENTIAL/PLATELET
BASOS PCT: 2 %
Basophils Absolute: 0.1 10*3/uL (ref 0–0.1)
EOS ABS: 0.1 10*3/uL (ref 0–0.7)
EOS PCT: 2 %
HCT: 33.8 % — ABNORMAL LOW (ref 35.0–47.0)
Hemoglobin: 11.1 g/dL — ABNORMAL LOW (ref 12.0–16.0)
LYMPHS ABS: 1.3 10*3/uL (ref 1.0–3.6)
Lymphocytes Relative: 31 %
MCH: 28.3 pg (ref 26.0–34.0)
MCHC: 32.8 g/dL (ref 32.0–36.0)
MCV: 86.4 fL (ref 80.0–100.0)
MONO ABS: 0.5 10*3/uL (ref 0.2–0.9)
MONOS PCT: 13 %
Neutro Abs: 2.2 10*3/uL (ref 1.4–6.5)
Neutrophils Relative %: 52 %
Platelets: 367 10*3/uL (ref 150–440)
RBC: 3.91 MIL/uL (ref 3.80–5.20)
RDW: 17.4 % — AB (ref 11.5–14.5)
WBC: 4.1 10*3/uL (ref 3.6–11.0)

## 2015-04-13 LAB — MAGNESIUM: Magnesium: 1.8 mg/dL (ref 1.7–2.4)

## 2015-04-13 LAB — COMPREHENSIVE METABOLIC PANEL
ALBUMIN: 3.5 g/dL (ref 3.5–5.0)
ALT: 10 U/L — ABNORMAL LOW (ref 14–54)
ANION GAP: 5 (ref 5–15)
AST: 19 U/L (ref 15–41)
Alkaline Phosphatase: 62 U/L (ref 38–126)
BUN: 21 mg/dL — ABNORMAL HIGH (ref 6–20)
CALCIUM: 8.7 mg/dL — AB (ref 8.9–10.3)
CO2: 27 mmol/L (ref 22–32)
Chloride: 105 mmol/L (ref 101–111)
Creatinine, Ser: 0.86 mg/dL (ref 0.44–1.00)
GFR calc non Af Amer: >60 mL/min (ref >60)
GLUCOSE: 98 mg/dL (ref 65–99)
POTASSIUM: 3.7 mmol/L (ref 3.5–5.1)
SODIUM: 137 mmol/L (ref 135–145)
TOTAL PROTEIN: 7.1 g/dL (ref 6.5–8.1)
Total Bilirubin: 0.4 mg/dL (ref 0.3–1.2)

## 2015-04-13 MED ORDER — SODIUM CHLORIDE 0.9 % IV SOLN
Freq: Once | INTRAVENOUS | Status: AC
  Administered 2015-04-13: 11:00:00 via INTRAVENOUS
  Filled 2015-04-13: qty 1000

## 2015-04-13 MED ORDER — SODIUM CHLORIDE 0.9 % IJ SOLN
10.0000 mL | INTRAMUSCULAR | Status: DC | PRN
  Filled 2015-04-13: qty 10

## 2015-04-13 MED ORDER — BEVACIZUMAB CHEMO INJECTION 400 MG/16ML
10.0000 mg/kg | Freq: Once | INTRAVENOUS | Status: AC
  Administered 2015-04-13: 625 mg via INTRAVENOUS
  Filled 2015-04-13: qty 20

## 2015-04-13 MED ORDER — HEPARIN SOD (PORK) LOCK FLUSH 100 UNIT/ML IV SOLN
500.0000 [IU] | Freq: Once | INTRAVENOUS | Status: AC | PRN
  Administered 2015-04-13: 500 [IU]

## 2015-04-13 NOTE — Progress Notes (Signed)
Patient states she feels lifeless and her heart is racing.

## 2015-04-13 NOTE — Progress Notes (Signed)
[place] @ [facility] Telephone:(336) [phone]  Fax:(336) [place] OB: 30-Apr-1928  MR#: [id number]  PQZ#:[id number]  Patient Care Team: Ezequiel Kayser, [name]  CHIEF COMPLAINT:  Chief Complaint  Patient presents with  . Carcinoma of fallopian tube   Oncology History   . 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  Adequate TRS, no macroscopic residual. IP/IV chemotherapy with DDP and paclitaxel on GOG protocol, chemo and Bev consolidation completed in 03/2011 2. 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 November 14, 2011- Patient was started on carboplatin and gemcitabine 4. Finished 6 cycles of chemotherapy in April of 2014 with carboplatin and gemcitabine. Tolerance was fairly good except for neutropenia and thrombocytopenia. 5.recurrent disease by CT scan February of 2015 6.radiation therapy to pelvis  May of 2015 7.upper extremity  . and deep vein thrombosis associated with port.(June of 2015) patient started on   [place] had port removed and had   thrombectomy September 06, 2013. 9.progressing disease in the right inguinal area 10.  Patient was started on Taxol and Avastin on a weekly basis 11.  Patient is   On chemotherapy holidays because of persistent urinary problem   12Recurrent carcinoma of fallopian tube.  Presently on observation    13.  Progressive right inguinal lymph node enlargement so patient was started on Taxol and Avastin (January, 2016) 14.  Somatic mutation for BRCA1 was negative (February, 2017) Germline mutation for BRCA1 was negative in the past    INTERVAL HISTORY:  82 year old lady with recurrent ovarian cancer treated with chemotherapy and radiation therapy to the right inguinal area.  Complains of swelling of the right lower extremity where patient had a significant injury.  Tenderness present.  No nausea.  No vomiting.   Patient continues to have problem with bladder being evaluated by Dr. Richrd Humbles, gynecologist.  No chills.  No fever.  No abdominal pain. She has problem with numbness and tingling in both lower extremity and upper extremity which continues to bother the patient in spite of taking Neurontin.  REVIEW OF SYSTEMS:   GENERAL:  Feels good.  Active.  No fevers, sweats or weight loss. Recent had a recent fall  PERFORMANCE STATUS (ECOG):01 HEENT:  No visual changes, runny nose, sore throat, mouth sores or tenderness. Lungs: No shortness of breath or cough.  No hemoptysis. Cardiac:  No chest pain, palpitations, orthopnea, or PND. GI:  No nausea, vomiting, diarrhea, constipation, melena or hematochezia. GU:  Continues to have incontinence of urine.  Intermittent burning.  Recently had cystoscopy which revealed no evidence of malignancy.  Musculoskeletal:  No back pain.  No joint pain.  No muscle tenderness.Swelling of the right lower extremity ecchymosis.  To follow  Extremities:  No pain or swelling. Skin:  No rashes or skin changes. Neuro:  No headache, numbness or weakness, balance or coordination issues. Endocrine:  No diabetes, thyroid issues, hot flashes or night sweats. Psych:  No mood changes, depression or anxiety. Pain:  No focal pain. Review of systems:  All other systems reviewed and found to be negative. As per HPI. Otherwise, a complete review of systems is negatve.    Smoking History: Smoking History Never Smoked.  PFSH: Family History: noncontributory  Comments: no maligancies  Social History: negative alcohol, negative tobacco  Additional Past Medical and Surgical History: Med:   HTN for 9 years  celiac disease, not active,              seasonal allergies              arthritis of the small joints,  TIA 20 years ago no further symptoms.    Mitral wall prolapse without any significant cardiac arrhythmias or congestive heart failure.    Surg:     1963   hysterectomy for descensus               1996  cholecystectomy through the scope,               2000  sling procedure for incontinence (Dr. Kathyrn Lass)   [place]:  Patient does have advance healthcare directive, Patient   does not desire to make any changes HEALTH MAINTENANCE: Social History  Substance Use Topics  . Smoking status: Never Smoker   . Smokeless tobacco: None  . Alcohol Use: No      Allergies  Allergen Reactions  . Benadryl [Diphenhydramine] Shortness Of Breath    Sob, rash, swelling  . Alendronate Sodium     Other reaction(s): Other (See Comments) Dysphagia  . Duloxetine Hcl Diarrhea  . Risedronate Sodium Rash    Aching, dysphagia  . Nitrofurantoin Other (See Comments)  . Lovastatin     Other reaction(s): Other (See Comments) GI upset  . Sulfa Antibiotics Rash     OBJECTIVE:  Filed Vitals:   04/13/15 0950  BP: 136/83  Pulse: 66  Temp: 96.6 F (35.9 C)  Resp: 18     Body mass index is 26.95 kg/(m^2).    ECOG FS:1 - Symptomatic but completely ambulatory  PHYSICAL EXAM: General  status: Performance status is good.  Patient has not lost significant weight HEENT: No evidence of stomatitis. Sclera and conjunctivae :: No jaundice.   pale looking. Lungs: Air  entry equal on both sides.  No rhonchi.  No rales.  Cardiac: Heart sounds are normal.  No pericardial rub.  No murmur. Lymphatic system: Cervical, axillary, inguinal, lymph nodes not palpable Right inguinal lymph node has increased in size  GI: Abdomen is soft.  No ascites.  Liver spleen not palpable.  No tenderness.  Bowel sounds are within normal limit Lower extremity: Right lower extremity ecchymosis swelling.  Is improved but tenderness persist Neurological system: Higher functions, cranial nerves intact no evidence of peripheral neuropathy. Skin: No rash.  No ecchymosis.Marland Kitchen\   LAB RESULTS:  Infusion on 04/13/2015  Component Date Value Ref Range Status  . WBC 04/13/2015 4.1  3.6 -  11.0 K/uL Final  . RBC 04/13/2015 3.91  3.80 - 5.20 MIL/uL Final  . Hemoglobin 04/13/2015 11.1* 12.0 - 16.0 g/dL Final  . HCT 04/13/2015 33.8* 35.0 - 47.0 % Final  . MCV 04/13/2015 86.4  80.0 - 100.0 fL Final  . MCH 04/13/2015 28.3  26.0 - 34.0 pg Final  . MCHC 04/13/2015 32.8  32.0 - 36.0 g/dL Final  . RDW 04/13/2015 17.4* 11.5 - 14.5 % Final  . Platelets 04/13/2015 367  150 - 440 K/uL Final  . Neutrophils Relative % 04/13/2015 52   Final  . Neutro Abs 04/13/2015 2.2  1.4 - 6.5 K/uL Final  . Lymphocytes Relative 04/13/2015 31   Final  . Lymphs Abs 04/13/2015 1.3  1.0 - 3.6 K/uL Final  . Monocytes Relative 04/13/2015 13   Final  . Monocytes Absolute 04/13/2015 0.5  0.2 - 0.9 K/uL Final  . Eosinophils Relative 04/13/2015 2   Final  .  Eosinophils Absolute 04/13/2015 0.1  0 - 0.7 K/uL Final  . Basophils Relative 04/13/2015 2   Final  . Basophils Absolute 04/13/2015 0.1  0 - 0.1 K/uL Final  . Sodium 04/13/2015 137  135 - 145 mmol/L Final  . Potassium 04/13/2015 3.7  3.5 - 5.1 mmol/L Final  . Chloride 04/13/2015 105  101 - 111 mmol/L Final  . CO2 04/13/2015 27  22 - 32 mmol/L Final  . Glucose, Bld 04/13/2015 98  65 - 99 mg/dL Final  . BUN 04/13/2015 21* 6 - 20 mg/dL Final  . Creatinine, Ser 04/13/2015 0.86  0.44 - 1.00 mg/dL Final  . Calcium 04/13/2015 8.7* 8.9 - 10.3 mg/dL Final  . Total Protein 04/13/2015 7.1  6.5 - 8.1 g/dL Final  . Albumin 04/13/2015 3.5  3.5 - 5.0 g/dL Final  . AST 04/13/2015 19  15 - 41 U/L Final  . ALT 04/13/2015 10* 14 - 54 U/L Final  . Alkaline Phosphatase 04/13/2015 62  38 - 126 U/L Final  . Total Bilirubin 04/13/2015 0.4  0.3 - 1.2 mg/dL Final  . GFR calc non Af Amer 04/13/2015 >60  >60 mL/min Final  . GFR calc Af Amer 04/13/2015 >60  >60 mL/min Final   Comment: (NOTE) The eGFR has been calculated using the CKD EPI equation. This calculation has not been validated in all clinical situations. eGFR's persistently <60 mL/min signify possible Chronic  Kidney Disease.   . Anion gap 04/13/2015 5  5 - 15 Final  . Magnesium 04/13/2015 1.8  1.7 - 2.4 mg/dL Final     CA 125 (last week) was 44.9 which is rising from previous 1 month ago which was 26.2     ASSESSMENT: 82 year old lady with recurrent ovarian cancer. CT scan and clinical examination shows progressive disease. Start patient on Taxol and Avastin therapy because of progressive swelling of right lower extremity as well as pain by tumor markers.  [place] @ [facility] [phone]  Fax:(336) [place] OB: [birth year]  MR#: [id number]  GUY#:[id number]  Patient Care Team: Ezequiel Kayser, [name]  CHIEF COMPLAINT:  Chief Complaint  Patient presents with  . Carcinoma of fallopian tube   Oncology History   . 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  Adequate TRS, no macroscopic residual. IP/IV chemotherapy with DDP and paclitaxel on GOG protocol, chemo and Bev consolidation completed in 03/2011 2. 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 November 14, 2011- Patient was started on carboplatin and gemcitabine 4. Finished 6 cycles of chemotherapy in April of 2014 with carboplatin and gemcitabine. Tolerance was fairly good except for neutropenia and thrombocytopenia. 5.recurrent disease by CT scan February of 2015 6.radiation therapy to pelvis  May of 2015 7.upper extremity  . and deep vein thrombosis associated with port.(June of 2015) patient started on   [place] had port removed and had   thrombectomy September 06, 2013. 9.progressing disease in the right inguinal area 10.  Patient was started on Taxol and Avastin on a weekly basis 11.  Patient is   On chemotherapy holidays because of persistent urinary problem   12Recurrent carcinoma of fallopian tube.  Presently on observation   13.  Because of persistent and progressive neuropathy  which is becoming in tolerable we have decided to quit Taxol at present time and continue Avastin (March 24, 2015)      INTERVAL  HISTORY:  82 year old lady with recurrent ovarian cancer treated with chemotherapy and radiation therapy to the right inguinal area. 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 Patient also has question regarding continuing aspirin 81 mg which patient was previously taking but has been discontinued when her platelet count dropped it to Taxol chemotherapy Swelling in the right lower extremity has somewhat improved but still persistent patient had some question regarding that. Patient also has a metastases to the right inguinal lymph node and pain in that area has improved.  Her bladder symptoms are stable. Here for further evaluation and treatment consideration    REVIEW OF SYSTEMS:   GENERAL:  Patient is somewhat distress and depressed because of continuing treatment.  But overall general status has improved.   PERFORMANCE STATUS (ECOG):01 HEENT:  No visual changes, runny nose, sore throat, mouth sores or tenderness. Lungs: No shortness of breath or cough.  No hemoptysis. Cardiac:  No chest pain, palpitations, orthopnea, or PND. GI:  No nausea, vomiting, diarrhea, constipation, melena or hematochezia. GU:  Bladder symptoms have improved  Extremities: Persistent right lower extremity swelling and right inguinal enlarging lymph node  Skin:  No rashes or skin changes. Neuro:  No headache, numbness or weakness, balance or coordination issues. Endocrine:  No diabetes, thyroid issues, hot flashes or night sweats. Psych:  No mood changes, depression or anxiety. Pain:  No focal pain. Review of systems:  All other  systems reviewed and found to be negative. As per HPI. Otherwise, a complete review of systems is negatve.    Smoking History: Smoking History Never Smoked.  PFSH: Family History: noncontributory  Comments: no maligancies  Social History: negative alcohol, negative tobacco  Additional Past Medical and Surgical History: Med:   HTN for 9 years             celiac disease, not active,              seasonal allergies              arthritis of the small joints,  TIA 20 years ago no further symptoms.    Mitral wall prolapse without any significant cardiac arrhythmias or congestive heart failure.    Surg:     1963  hysterectomy for descensus               1996  cholecystectomy through the scope,               2000  sling procedure for incontinence (Dr. Kathyrn Lass)   [place]:  Patient does have advance healthcare directive, Patient   does not desire to make any changes HEALTH MAINTENANCE: Social History  Substance Use Topics  . Smoking status: Never Smoker   . Smokeless tobacco: None  . Alcohol Use: No      Allergies  Allergen Reactions  . Benadryl [Diphenhydramine] Shortness Of Breath    Sob, rash, swelling  . Alendronate Sodium     Other reaction(s): Other (See Comments) Dysphagia  . Duloxetine Hcl Diarrhea  . Risedronate Sodium Rash    Aching, dysphagia  . Nitrofurantoin Other (See Comments)  . Lovastatin     Other reaction(s): Other (See Comments) GI upset  . Sulfa Antibiotics Rash     OBJECTIVE:  Filed Vitals:   04/13/15 0950  BP:  136/83  Pulse: 66  Temp: 96.6 F (35.9 C)  Resp: 18     Body mass index is 26.95 kg/(m^2).    ECOG FS:1 - Symptomatic but completely ambulatory  PHYSICAL EXAM: General  status: Performance status is good.  Patient has not lost significant weight HEENT: No evidence of stomatitis. Sclera and conjunctivae :: No jaundice.   pale looking. Lungs: Air  entry equal on both sides.  No rhonchi.  No rales.  Cardiac: Heart sounds  are normal.  No pericardial rub.  No murmur. Lymphatic system: Cervical, axillary, inguinal, lymph nodes not palpable Right inguinal lymph node has increased in size  GI: Abdomen is soft.  No ascites.  Liver spleen not palpable.  No tenderness.  Bowel sounds are within normal limit Lower extremity: Right lower extremity ecchymosis swelling.  Is improved but tenderness persist Neurological system: Higher functions, cranial nerves intact no evidence of peripheral neuropathy. Skin: No rash.  No ecchymosis.Marland Kitchen\   LAB RESULTS:  Infusion on 04/13/2015  Component Date Value Ref Range Status  . WBC 04/13/2015 4.1  3.6 - 11.0 K/uL Final  . RBC 04/13/2015 3.91  3.80 - 5.20 MIL/uL Final  . Hemoglobin 04/13/2015 11.1* 12.0 - 16.0 g/dL Final  . HCT 04/13/2015 33.8* 35.0 - 47.0 % Final  . MCV 04/13/2015 86.4  80.0 - 100.0 fL Final  . MCH 04/13/2015 28.3  26.0 - 34.0 pg Final  . MCHC 04/13/2015 32.8  32.0 - 36.0 g/dL Final  . RDW 04/13/2015 17.4* 11.5 - 14.5 % Final  . Platelets 04/13/2015 367  150 - 440 K/uL Final  . Neutrophils Relative % 04/13/2015 52   Final  . Neutro Abs 04/13/2015 2.2  1.4 - 6.5 K/uL Final  . Lymphocytes Relative 04/13/2015 31   Final  . Lymphs Abs 04/13/2015 1.3  1.0 - 3.6 K/uL Final  . Monocytes Relative 04/13/2015 13   Final  . Monocytes Absolute 04/13/2015 0.5  0.2 - 0.9 K/uL Final  . Eosinophils Relative 04/13/2015 2   Final  . Eosinophils Absolute 04/13/2015 0.1  0 - 0.7 K/uL Final  . Basophils Relative 04/13/2015 2   Final  . Basophils Absolute 04/13/2015 0.1  0 - 0.1 K/uL Final  . Sodium 04/13/2015 137  135 - 145 mmol/L Final  . Potassium 04/13/2015 3.7  3.5 - 5.1 mmol/L Final  . Chloride 04/13/2015 105  101 - 111 mmol/L Final  . CO2 04/13/2015 27  22 - 32 mmol/L Final  . Glucose, Bld 04/13/2015 98  65 - 99 mg/dL Final  . BUN 04/13/2015 21* 6 - 20 mg/dL Final  . Creatinine, Ser 04/13/2015 0.86  0.44 - 1.00 mg/dL Final  . Calcium 04/13/2015 8.7* 8.9 - 10.3 mg/dL  Final  . Total Protein 04/13/2015 7.1  6.5 - 8.1 g/dL Final  . Albumin 04/13/2015 3.5  3.5 - 5.0 g/dL Final  . AST 04/13/2015 19  15 - 41 U/L Final  . ALT 04/13/2015 10* 14 - 54 U/L Final  . Alkaline Phosphatase 04/13/2015 62  38 - 126 U/L Final  . Total Bilirubin 04/13/2015 0.4  0.3 - 1.2 mg/dL Final  . GFR calc non Af Amer 04/13/2015 >60  >60 mL/min Final  . GFR calc Af Amer 04/13/2015 >60  >60 mL/min Final   Comment: (NOTE) The eGFR has been calculated using the CKD EPI equation. This calculation has not been validated in all clinical situations. eGFR's persistently <60 mL/min signify possible Chronic Kidney Disease.   Georgiann Hahn  gap 04/13/2015 5  5 - 15 Final  . Magnesium 04/13/2015 1.8  1.7 - 2.4 mg/dL Final          ASSESSMENT: 82 year old lady with recurrent ovarian cancer. Patient has not recurrent and progressive disease now responding to chemotherapy by tumor markers as well as clinical examination Patient was responding to the Taxol but because of neuropathy which was impairing her quality of life patient has been taken off Taxol and now on maintenance Avastin therapy  A blood pressure is stable. There is no significant side effect of Avastin at present time Last tumor markers continues to show downward trend.  Tumor markers now pending 2.  Right lower quadrant swelling is due to lymphedema.  There is persistent enlargement of writing while lymph node which is not increased in size 3.  Bladder problem due to chronic cystitis has been stable       All lab data has been reviewed.  Patient has responded to Taxol now stable disease continue maintenance Herceptin.  Repeat tumor markers pending.  Reevaluate with another CT scan in tumor marker shows rising trend.  Continue Avastin today as well as in 3 weeks and reevaluate patient in 6 weeks  No matching staging information was found for the patient.  Forest Gleason, MD   04/13/2015 12:02 PM

## 2015-04-14 LAB — CA 125: CA 125: 26.5 U/mL (ref 0.0–38.1)

## 2015-04-18 ENCOUNTER — Encounter: Payer: Self-pay | Admitting: Obstetrics and Gynecology

## 2015-04-18 ENCOUNTER — Ambulatory Visit (INDEPENDENT_AMBULATORY_CARE_PROVIDER_SITE_OTHER): Payer: Medicare HMO | Admitting: Obstetrics and Gynecology

## 2015-04-18 VITALS — BP 114/72 | HR 74 | Ht 60.0 in | Wt 139.0 lb

## 2015-04-18 DIAGNOSIS — Z4689 Encounter for fitting and adjustment of other specified devices: Secondary | ICD-10-CM

## 2015-04-18 DIAGNOSIS — R339 Retention of urine, unspecified: Secondary | ICD-10-CM | POA: Diagnosis not present

## 2015-04-18 DIAGNOSIS — K5900 Constipation, unspecified: Secondary | ICD-10-CM

## 2015-04-18 DIAGNOSIS — IMO0001 Reserved for inherently not codable concepts without codable children: Secondary | ICD-10-CM

## 2015-04-18 DIAGNOSIS — IMO0002 Reserved for concepts with insufficient information to code with codable children: Secondary | ICD-10-CM

## 2015-04-18 DIAGNOSIS — N8110 Cystocele, unspecified: Secondary | ICD-10-CM | POA: Diagnosis not present

## 2015-04-18 DIAGNOSIS — N952 Postmenopausal atrophic vaginitis: Secondary | ICD-10-CM

## 2015-04-18 NOTE — Patient Instructions (Signed)
1. Return in 3 months for pessary check

## 2015-04-18 NOTE — Progress Notes (Signed)
GYN ENCOUNTER NOTE  Subjective:       Nicole Clayton is a 82 y.o. G50P3003 female is here for gynecologic evaluation of the following issues:  1. Pessary check, cleaning, and reinsertion.   2. Sensation of pelvic heaviness    Gynecologic History No LMP recorded. Patient has had a hysterectomy. Contraception: post menopausal status Last Pap: unknown. Results were: N/A Last mammogram: 2014. Results were: normal  Obstetric History OB History  Gravida Para Term Preterm AB SAB TAB Ectopic Multiple Living  3 3 3       3     # Outcome Date GA Lbr Len/2nd Weight Sex Delivery Anes PTL Lv  3 Term 1962   9 lb 0.8 oz (4.105 kg) M Vag-Spont   Y  2 Term 1960   8 lb 8 oz (3.856 kg) F Vag-Spont   Y  1 Term 1953   8 lb (3.629 kg) F Vag-Spont   Y      Past Medical History  Diagnosis Date  . MI (mitral incompetence) 10/27/2014    Overview:  MODERATE   . Bladder infection, chronic 10/19/2011  . Cancer (HCC)     Ovarian   . 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 Claiborne Rigg and Delorise Shiner Choksi Overview:  Drs. Claiborne Rigg and [employer]   . GERD (gastroesophageal reflux disease)   . Tachycardia   . OAB (overactive bladder)     Past Surgical History  Procedure Laterality Date  . Laparoscopic bilateral salpingo oopherectomy  2011  . Abdominal hysterectomy    . Cholecystectomy      Current Outpatient Prescriptions on File Prior to Visit  Medication Sig Dispense Refill  . acetaminophen (TYLENOL) 500 MG tablet Take 1 tablet (500 mg total) by mouth every 6 (six) hours as needed. 30 tablet 0  . Ascorbic Acid (VITAMIN C) 100 MG tablet Take 100 mg by mouth daily.    Marland Kitchen aspirin EC 81 MG tablet Take 81 mg by mouth.    . cetirizine (ZYRTEC) 10 MG tablet Take by mouth.    . Cholecalciferol (VITAMIN D3) 2000 UNITS capsule Take by mouth.    . Cranberry 1000 MG CAPS Take by mouth.    . docusate sodium (COLACE) 100 MG capsule Take 100 mg by mouth.    .  fluticasone (FLONASE) 50 MCG/ACT nasal spray Place into the nose.    . gabapentin (NEURONTIN) 600 MG tablet Take 1 tablet (600 mg total) by mouth 3 (three) times daily. 180 tablet 3  . HYDROcodone-acetaminophen (NORCO/VICODIN) 5-325 MG tablet Take 1 tablet by mouth every 6 (six) hours as needed for moderate pain. 30 tablet 0  . loratadine (CLARITIN) 10 MG tablet Take by mouth.    . nystatin ointment (MYCOSTATIN) Apply 1 application topically 2 (two) times daily. 30 g 0  . ondansetron (ZOFRAN) 4 MG tablet TAKE (1) TABLET BY MOUTH EVERY 8 HOURS AS NEEDED FOR NAUSEA/VOMITING 30 tablet 3  . pantoprazole (PROTONIX) 40 MG tablet Take 40 mg by mouth.    . triamcinolone ointment (KENALOG) 0.1 % Apply 1 application topically 2 (two) times daily. 30 g 0  . metoprolol succinate (TOPROL-XL) 50 MG 24 hr tablet Take 50 mg by mouth.     Current Facility-Administered Medications on File Prior to Visit  Medication Dose Route Frequency Provider Last Rate Last Dose  . diphenhydrAMINE (BENADRYL) injection 50 mg  50 mg Intravenous Once Forest Gleason, MD  50 mg at 02/16/15 1157  . sodium chloride 0.9 % injection 10 mL  10 mL Intravenous PRN Forest Gleason, MD   10 mL at 01/12/15 1116  . sodium chloride 0.9 % injection 10 mL  10 mL Intravenous PRN Forest Gleason, MD   10 mL at 02/16/15 1025    Allergies  Allergen Reactions  . Benadryl [Diphenhydramine] Shortness Of Breath    Sob, rash, swelling  . Alendronate Sodium     Other reaction(s): Other (See Comments) Dysphagia  . Duloxetine Hcl Diarrhea  . Risedronate Sodium Rash    Aching, dysphagia  . Nitrofurantoin Other (See Comments)  . Lovastatin     Other reaction(s): Other (See Comments) GI upset  . Sulfa Antibiotics Rash    Social History   Social History  . Marital Status: Married    Spouse Name: N/A  . Number of Children: N/A  . Years of Education: N/A   Occupational History  . Not on file.   Social History Main Topics  . Smoking status: Never  Smoker   . Smokeless tobacco: Not on file  . Alcohol Use: No  . Drug Use: No  . Sexual Activity: Not Currently    Birth Control/ Protection: Surgical   Other Topics Concern  . Not on file   Social History Narrative    Family History  Problem Relation Age of Onset  . Breast cancer Neg Hx   . Colon cancer Neg Hx   . Diabetes Neg Hx   . Heart disease Neg Hx   . Ovarian cancer Other     The following portions of the patient's history were reviewed and updated as appropriate: allergies, current medications, past family history, past medical history, past social history, past surgical history and problem list.  Review of Systems Review of Systems - General ROS: negative for - chills, fatigue, fever, hot flashes, malaise or night sweats Hematological and Lymphatic ROS: negative for - bleeding problems. Positive for swollen, painless rt inguinal lymph node Gastrointestinal ROS: negative for - abdominal pain, blood in stools, change in bowel habits and nausea/vomiting Musculoskeletal ROS: negative for - joint pain, muscle pain or muscular weakness Genito-Urinary ROS: negative for - change in menstrual cycle, dysmenorrhea, dyspareunia, dysuria, genital discharge, genital ulcers, hematuria, irregular/heavy menses, nocturia or pelvic pain. Positive for - urinary incontinence  Objective:   BP 114/72 mmHg  Pulse 74  Ht 5' (1.524 m)  Wt 139 lb (63.05 kg)  BMI 27.15 kg/m2 CONSTITUTIONAL: Well-developed, well-nourished female in no acute distress.  HENT:  Normocephalic, atraumatic.  NECK: Normal range of motion, supple, no masses.  Normal thyroid.  SKIN: Skin is warm and dry. No rash noted. Not diaphoretic. No erythema. No pallor. [place]: Alert and oriented to person, place, and time. PSYCHIATRIC: Normal mood and affect. Normal behavior. Normal judgment and thought content. CARDIOVASCULAR:Not Examined RESPIRATORY: Not Examined BREASTS: Not Examined ABDOMEN: Soft, non distended; Non  tender.  No Organomegaly. Right inguinal mass palpably diminished in size from 8 x 5 cm at last visit to 1 x 2 cm currently PELVIC:  External Genitalia: urethral caruncle; mild epidermal discoloration surrounding anus  BUS: Normal  Vagina: moderate atrophy, vaginal cuff intact  Cervix: surgically absent  Uterus: surgically absent  Adnexa: no palpable masses  RV: Normal; considerable amount of soft stool in rectal vault  Bladder: Nontender MUSCULOSKELETAL: Normal range of motion. No tenderness.  No cyanosis, clubbing, or edema.     Assessment:   1. Cystocele, grade 2: stable 2.  Pessary maintenance: completed; pessary was removed, cleaned, and reinserted. No evidence of infection 3. Incomplete bladder emptying: improved 4. Vaginal atrophy: stable 5. Constipation, unspecified constipation type: chronic constipation may be contributing to fullness/heaviness noted by patient. 6.Chronic diarrhea with perianal inflammation: resolved currently 7. Fallopian tube cancer recurrence: right inguinal mass reduced from last visit; Taxol chemotherapy regulated by patient's oncologist 8. Sensation of pelvic heaviness: no evidence of tumor or other anatomical anomaly on exam     Plan:   1. Pessary is removed, cleaned, and reinserted. 2. Return in 3 months for follow-up. 3. Continue to use the nystatin/triamcinolone cream as needed when chronic diarrhea flare recurs.  A total of 15 minutes were spent face-to-face with the patient during this encounter and over half of that time dealt with counseling and coordination of care.   Olene Floss, PA-S Brayton Mars, MD   I have seen, interviewed, and examined the patient in conjunction with the [facility].A. student and affirm the diagnosis and management plan. Atina Feeley A. Rosaleen Mazer, MD, FACOG   Note: This dictation was prepared with Dragon dictation along with smaller phrase technology. Any transcriptional errors that result  from this process are unintentional.

## 2015-05-01 DIAGNOSIS — R7303 Prediabetes: Secondary | ICD-10-CM | POA: Insufficient documentation

## 2015-05-04 ENCOUNTER — Other Ambulatory Visit: Payer: Self-pay | Admitting: Oncology

## 2015-05-04 ENCOUNTER — Inpatient Hospital Stay: Payer: Medicare HMO

## 2015-05-04 VITALS — BP 123/73 | HR 62 | Temp 96.7°F | Resp 18

## 2015-05-04 DIAGNOSIS — Z5112 Encounter for antineoplastic immunotherapy: Secondary | ICD-10-CM | POA: Diagnosis not present

## 2015-05-04 DIAGNOSIS — C569 Malignant neoplasm of unspecified ovary: Secondary | ICD-10-CM

## 2015-05-04 DIAGNOSIS — C5700 Malignant neoplasm of unspecified fallopian tube: Secondary | ICD-10-CM

## 2015-05-04 LAB — CBC WITH DIFFERENTIAL/PLATELET
BASOS ABS: 0 10*3/uL (ref 0–0.1)
Basophils Relative: 1 %
EOS PCT: 2 %
Eosinophils Absolute: 0.1 10*3/uL (ref 0–0.7)
HCT: 31.7 % — ABNORMAL LOW (ref 35.0–47.0)
Hemoglobin: 10.5 g/dL — ABNORMAL LOW (ref 12.0–16.0)
LYMPHS ABS: 1.8 10*3/uL (ref 1.0–3.6)
LYMPHS PCT: 36 %
MCH: 28.6 pg (ref 26.0–34.0)
MCHC: 33.2 g/dL (ref 32.0–36.0)
MCV: 86.2 fL (ref 80.0–100.0)
MONO ABS: 0.8 10*3/uL (ref 0.2–0.9)
Monocytes Relative: 16 %
NEUTROS ABS: 2.2 10*3/uL (ref 1.4–6.5)
NEUTROS PCT: 45 %
PLATELETS: 369 10*3/uL (ref 150–440)
RBC: 3.67 MIL/uL — ABNORMAL LOW (ref 3.80–5.20)
RDW: 17.3 % — AB (ref 11.5–14.5)
WBC: 5 10*3/uL (ref 3.6–11.0)

## 2015-05-04 LAB — COMPREHENSIVE METABOLIC PANEL
ALT: 11 U/L — ABNORMAL LOW (ref 14–54)
AST: 20 U/L (ref 15–41)
Albumin: 3.5 g/dL (ref 3.5–5.0)
Alkaline Phosphatase: 52 U/L (ref 38–126)
Anion gap: 5 (ref 5–15)
BUN: 19 mg/dL (ref 6–20)
CHLORIDE: 105 mmol/L (ref 101–111)
CO2: 27 mmol/L (ref 22–32)
Calcium: 8.2 mg/dL — ABNORMAL LOW (ref 8.9–10.3)
Creatinine, Ser: 1 mg/dL (ref 0.44–1.00)
GFR, EST AFRICAN AMERICAN: 58 mL/min — AB (ref >60)
GFR, EST NON AFRICAN AMERICAN: 50 mL/min — AB (ref >60)
Glucose, Bld: 97 mg/dL (ref 65–99)
POTASSIUM: 4 mmol/L (ref 3.5–5.1)
Sodium: 137 mmol/L (ref 135–145)
Total Bilirubin: 0.3 mg/dL (ref 0.3–1.2)
Total Protein: 7 g/dL (ref 6.5–8.1)

## 2015-05-04 LAB — MAGNESIUM: MAGNESIUM: 1.8 mg/dL (ref 1.7–2.4)

## 2015-05-04 MED ORDER — HEPARIN SOD (PORK) LOCK FLUSH 100 UNIT/ML IV SOLN
500.0000 [IU] | Freq: Once | INTRAVENOUS | Status: AC | PRN
  Administered 2015-05-04: 500 [IU]

## 2015-05-04 MED ORDER — BEVACIZUMAB CHEMO INJECTION 400 MG/16ML
10.0000 mg/kg | Freq: Once | INTRAVENOUS | Status: AC
  Administered 2015-05-04: 625 mg via INTRAVENOUS
  Filled 2015-05-04: qty 20

## 2015-05-04 MED ORDER — SODIUM CHLORIDE 0.9 % IV SOLN
Freq: Once | INTRAVENOUS | Status: AC
  Administered 2015-05-04: 15:00:00 via INTRAVENOUS
  Filled 2015-05-04: qty 1000

## 2015-05-04 NOTE — Progress Notes (Signed)
Per Andria Rhein  (pharmacy) and Myrtie Neither, RN UA protein not needed prior to this treatment.

## 2015-05-23 ENCOUNTER — Other Ambulatory Visit: Payer: Self-pay | Admitting: Oncology

## 2015-05-25 ENCOUNTER — Inpatient Hospital Stay: Payer: Medicare HMO

## 2015-05-25 ENCOUNTER — Inpatient Hospital Stay: Payer: Medicare HMO | Attending: Oncology

## 2015-05-25 ENCOUNTER — Inpatient Hospital Stay (HOSPITAL_BASED_OUTPATIENT_CLINIC_OR_DEPARTMENT_OTHER): Payer: Medicare HMO | Admitting: Oncology

## 2015-05-25 VITALS — BP 143/80 | HR 68 | Resp 20

## 2015-05-25 VITALS — BP 119/82 | HR 60 | Temp 96.1°F | Resp 18 | Wt 137.4 lb

## 2015-05-25 DIAGNOSIS — C5700 Malignant neoplasm of unspecified fallopian tube: Secondary | ICD-10-CM

## 2015-05-25 DIAGNOSIS — Z8543 Personal history of malignant neoplasm of ovary: Secondary | ICD-10-CM | POA: Diagnosis not present

## 2015-05-25 DIAGNOSIS — R978 Other abnormal tumor markers: Secondary | ICD-10-CM | POA: Insufficient documentation

## 2015-05-25 DIAGNOSIS — C569 Malignant neoplasm of unspecified ovary: Secondary | ICD-10-CM

## 2015-05-25 DIAGNOSIS — Z9221 Personal history of antineoplastic chemotherapy: Secondary | ICD-10-CM | POA: Diagnosis not present

## 2015-05-25 DIAGNOSIS — Z8673 Personal history of transient ischemic attack (TIA), and cerebral infarction without residual deficits: Secondary | ICD-10-CM

## 2015-05-25 DIAGNOSIS — I341 Nonrheumatic mitral (valve) prolapse: Secondary | ICD-10-CM

## 2015-05-25 DIAGNOSIS — Z5112 Encounter for antineoplastic immunotherapy: Secondary | ICD-10-CM | POA: Insufficient documentation

## 2015-05-25 DIAGNOSIS — C774 Secondary and unspecified malignant neoplasm of inguinal and lower limb lymph nodes: Secondary | ICD-10-CM | POA: Diagnosis not present

## 2015-05-25 DIAGNOSIS — Z79899 Other long term (current) drug therapy: Secondary | ICD-10-CM | POA: Diagnosis not present

## 2015-05-25 DIAGNOSIS — R58 Hemorrhage, not elsewhere classified: Secondary | ICD-10-CM | POA: Insufficient documentation

## 2015-05-25 DIAGNOSIS — M7989 Other specified soft tissue disorders: Secondary | ICD-10-CM | POA: Diagnosis not present

## 2015-05-25 DIAGNOSIS — Z923 Personal history of irradiation: Secondary | ICD-10-CM | POA: Insufficient documentation

## 2015-05-25 DIAGNOSIS — R32 Unspecified urinary incontinence: Secondary | ICD-10-CM | POA: Diagnosis not present

## 2015-05-25 DIAGNOSIS — Z86718 Personal history of other venous thrombosis and embolism: Secondary | ICD-10-CM | POA: Diagnosis not present

## 2015-05-25 LAB — COMPREHENSIVE METABOLIC PANEL
ALK PHOS: 53 U/L (ref 38–126)
ALT: 11 U/L — ABNORMAL LOW (ref 14–54)
ANION GAP: 6 (ref 5–15)
AST: 23 U/L (ref 15–41)
Albumin: 3.6 g/dL (ref 3.5–5.0)
BILIRUBIN TOTAL: 0.2 mg/dL — AB (ref 0.3–1.2)
BUN: 20 mg/dL (ref 6–20)
CALCIUM: 8.9 mg/dL (ref 8.9–10.3)
CO2: 27 mmol/L (ref 22–32)
Chloride: 106 mmol/L (ref 101–111)
Creatinine, Ser: 0.81 mg/dL (ref 0.44–1.00)
GFR calc Af Amer: >60 mL/min (ref >60)
Glucose, Bld: 114 mg/dL — ABNORMAL HIGH (ref 65–99)
Potassium: 3.8 mmol/L (ref 3.5–5.1)
Sodium: 139 mmol/L (ref 135–145)
TOTAL PROTEIN: 6.8 g/dL (ref 6.5–8.1)

## 2015-05-25 LAB — CBC WITH DIFFERENTIAL/PLATELET
Basophils Absolute: 0 10*3/uL (ref 0–0.1)
Basophils Relative: 1 %
Eosinophils Absolute: 0 10*3/uL (ref 0–0.7)
Eosinophils Relative: 1 %
HEMATOCRIT: 33.4 % — AB (ref 35.0–47.0)
HEMOGLOBIN: 11 g/dL — AB (ref 12.0–16.0)
LYMPHS ABS: 1.3 10*3/uL (ref 1.0–3.6)
LYMPHS PCT: 36 %
MCH: 28 pg (ref 26.0–34.0)
MCHC: 32.9 g/dL (ref 32.0–36.0)
MCV: 85.3 fL (ref 80.0–100.0)
MONO ABS: 0.5 10*3/uL (ref 0.2–0.9)
MONOS PCT: 15 %
NEUTROS ABS: 1.7 10*3/uL (ref 1.4–6.5)
NEUTROS PCT: 47 %
Platelets: 413 10*3/uL (ref 150–440)
RBC: 3.91 MIL/uL (ref 3.80–5.20)
RDW: 17.5 % — AB (ref 11.5–14.5)
WBC: 3.6 10*3/uL (ref 3.6–11.0)

## 2015-05-25 LAB — MAGNESIUM: MAGNESIUM: 1.8 mg/dL (ref 1.7–2.4)

## 2015-05-25 LAB — PROTEIN, URINE, RANDOM: Total Protein, Urine: 6 mg/dL

## 2015-05-25 MED ORDER — BEVACIZUMAB CHEMO INJECTION 400 MG/16ML
10.0000 mg/kg | Freq: Once | INTRAVENOUS | Status: AC
  Administered 2015-05-25: 625 mg via INTRAVENOUS
  Filled 2015-05-25: qty 20

## 2015-05-25 MED ORDER — SODIUM CHLORIDE 0.9 % IV SOLN
Freq: Once | INTRAVENOUS | Status: AC
  Administered 2015-05-25: 13:00:00 via INTRAVENOUS
  Filled 2015-05-25: qty 1000

## 2015-05-25 MED ORDER — HEPARIN SOD (PORK) LOCK FLUSH 100 UNIT/ML IV SOLN
500.0000 [IU] | Freq: Once | INTRAVENOUS | Status: AC | PRN
  Administered 2015-05-25: 500 [IU]
  Filled 2015-05-25: qty 5

## 2015-05-26 ENCOUNTER — Encounter: Payer: Self-pay | Admitting: Oncology

## 2015-05-26 LAB — CA 125: CA 125: 26.8 U/mL (ref 0.0–38.1)

## 2015-05-26 NOTE — Progress Notes (Signed)
[place] @ [facility] [phone]  Fax:([phone]) [place] OB: 1928-07-05  MR#: [id number]  VOZ#:[id number]  Patient Care Team: Ezequiel Kayser, [name]  CHIEF COMPLAINT:  Chief Complaint  Patient presents with  . Ovarian Cancer   Oncology History   . 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  Adequate TRS, no macroscopic residual. IP/IV chemotherapy with DDP and paclitaxel on GOG protocol, chemo and Bev consolidation completed in 03/2011 2. 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 November 14, 2011- Patient was started on carboplatin and gemcitabine 4. Finished 6 cycles of chemotherapy in April of 2014 with carboplatin and gemcitabine. Tolerance was fairly good except for neutropenia and thrombocytopenia. 5.recurrent disease by CT scan February of 2015 6.radiation therapy to pelvis  May of 2015 7.upper extremity  . and deep vein thrombosis associated with port.(June of 2015) patient started on   [place] had port removed and had   thrombectomy September 06, 2013. 9.progressing disease in the right inguinal area 10.  Patient was started on Taxol and Avastin on a weekly basis 11.  Patient is   On chemotherapy holidays because of persistent urinary problem   12Recurrent carcinoma of fallopian tube.  Presently on observation    13.  Progressive right inguinal lymph node enlargement so patient was started on Taxol and Avastin (January, 2016) 14.  Somatic mutation for BRCA1 was negative (February, 2017) Germline mutation for BRCA1 was negative in the past    INTERVAL HISTORY:  82 year old lady with recurrent ovarian cancer treated with chemotherapy and radiation therapy to the right inguinal area.  Complains of swelling of the right lower extremity where patient had a significant injury.  Tenderness present.  No nausea.  No vomiting.  Patient  continues to have problem with bladder being evaluated by Dr. Richrd Humbles, gynecologist.  No chills.  No fever.  No abdominal pain. She has problem with numbness and tingling in both lower extremity and upper extremity which continues to bother the patient in spite of taking Neurontin.Marland Kitchen PATIENT  is here for ongoing evaluation and continuation of treatment. Swelling in the right lower extremity persist but has not increased.  Patient's urinary problem has improved.  No hematuria or hematemesis.  RIGHT INGUINAL pain has improved  REVIEW OF SYSTEMS:   GENERAL:  Feels good.  Active.  No fevers, sweats or weight loss. Recent had a recent fall  PERFORMANCE STATUS (ECOG):01 HEENT:  No visual changes, runny nose, sore throat, mouth sores or tenderness. Lungs: No shortness of breath or cough.  No hemoptysis. Cardiac:  No chest pain, palpitations, orthopnea, or PND. GI:  No nausea, vomiting, diarrhea, constipation, melena or hematochezia. GU:  Continues to have incontinence of urine.  Intermittent burning.  Recently had cystoscopy which revealed no evidence of malignancy.  Musculoskeletal:  No back pain.  No joint pain.  No muscle tenderness.Swelling of the right lower extremity ecchymosis.  To follow  Extremities:  No pain or swelling. Skin:  No rashes or skin changes. Neuro:  No headache, numbness or weakness, balance or coordination issues. Endocrine:  No diabetes, thyroid issues, hot flashes or night sweats. Psych:  No mood changes, depression or anxiety. Pain:  No focal pain. Review of systems:  All other systems reviewed and found to be negative. As per HPI. Otherwise, a complete review of systems is negatve.    Smoking History: Smoking History  Never Smoked.  PFSH: Family History: noncontributory  Comments: no maligancies  Social History: negative alcohol, negative tobacco  Additional Past Medical and Surgical History: Med:   HTN for 9 years             celiac disease, not active,               seasonal allergies              arthritis of the small joints,  TIA 20 years ago no further symptoms.    Mitral wall prolapse without any significant cardiac arrhythmias or congestive heart failure.    Surg:     1963  hysterectomy for descensus               1996  cholecystectomy through the scope,               2000  sling procedure for incontinence (Dr. Kathyrn Lass)   [place]:  Patient does have advance healthcare directive, Patient   does not desire to make any changes HEALTH MAINTENANCE: Social History  Substance Use Topics  . Smoking status: Never Smoker   . Smokeless tobacco: None  . Alcohol Use: No      Allergies  Allergen Reactions  . Benadryl [Diphenhydramine] Shortness Of Breath    Sob, rash, swelling  . Alendronate Sodium     Other reaction(s): Other (See Comments) Dysphagia  . Duloxetine Hcl Diarrhea  . Risedronate Sodium Rash    Aching, dysphagia  . Nitrofurantoin Other (See Comments)  . Lovastatin     Other reaction(s): Other (See Comments) GI upset  . Sulfa Antibiotics Rash     OBJECTIVE:  Filed Vitals:   05/25/15 1147  BP: 119/82  Pulse: 60  Temp: 96.1 F (35.6 C)  Resp: 18     Body mass index is 26.83 kg/(m^2).    ECOG FS:1 - Symptomatic but completely ambulatory  PHYSICAL EXAM: General  status: Performance status is good.  Patient has not lost significant weight HEENT: No evidence of stomatitis. Sclera and conjunctivae :: No jaundice.   pale looking. Lungs: Air  entry equal on both sides.  No rhonchi.  No rales.  Cardiac: Heart sounds are normal.  No pericardial rub.  No murmur. Lymphatic system: Cervical, axillary, inguinal, lymph nodes not palpable Right inguinal lymph node has increased in size  GI: Abdomen is soft.  No ascites.  Liver spleen not palpable.  No tenderness.  Bowel sounds are within normal limit Lower extremity: Right lower extremity ecchymosis swelling.  Is improved but tenderness persist Neurological  system: Higher functions, cranial nerves intact no evidence of peripheral neuropathy. Skin: No rash.  No ecchymosis.Marland Kitchen\   LAB RESULTS:  Infusion on 05/25/2015  Component Date Value Ref Range Status  . Total Protein, Urine 05/25/2015 6   Final   NO NORMAL RANGE ESTABLISHED FOR THIS TEST  Appointment on 05/25/2015  Component Date Value Ref Range Status  . WBC 05/25/2015 3.6  3.6 - 11.0 K/uL Final  . RBC 05/25/2015 3.91  3.80 - 5.20 MIL/uL Final  . Hemoglobin 05/25/2015 11.0* 12.0 - 16.0 g/dL Final  . HCT 05/25/2015 33.4* 35.0 - 47.0 % Final  . MCV 05/25/2015 85.3  80.0 - 100.0 fL Final  . MCH 05/25/2015 28.0  26.0 - 34.0 pg Final  . MCHC 05/25/2015 32.9  32.0 - 36.0 g/dL Final  . RDW 05/25/2015 17.5* 11.5 - 14.5 % Final  . Platelets 05/25/2015 413  150 - 440 K/uL  Final  . Neutrophils Relative % 05/25/2015 47   Final  . Neutro Abs 05/25/2015 1.7  1.4 - 6.5 K/uL Final  . Lymphocytes Relative 05/25/2015 36   Final  . Lymphs Abs 05/25/2015 1.3  1.0 - 3.6 K/uL Final  . Monocytes Relative 05/25/2015 15   Final  . Monocytes Absolute 05/25/2015 0.5  0.2 - 0.9 K/uL Final  . Eosinophils Relative 05/25/2015 1   Final  . Eosinophils Absolute 05/25/2015 0.0  0 - 0.7 K/uL Final  . Basophils Relative 05/25/2015 1   Final  . Basophils Absolute 05/25/2015 0.0  0 - 0.1 K/uL Final  . Sodium 05/25/2015 139  135 - 145 mmol/L Final  . Potassium 05/25/2015 3.8  3.5 - 5.1 mmol/L Final  . Chloride 05/25/2015 106  101 - 111 mmol/L Final  . CO2 05/25/2015 27  22 - 32 mmol/L Final  . Glucose, Bld 05/25/2015 114* 65 - 99 mg/dL Final  . BUN 05/25/2015 20  6 - 20 mg/dL Final  . Creatinine, Ser 05/25/2015 0.81  0.44 - 1.00 mg/dL Final  . Calcium 05/25/2015 8.9  8.9 - 10.3 mg/dL Final  . Total Protein 05/25/2015 6.8  6.5 - 8.1 g/dL Final  . Albumin 05/25/2015 3.6  3.5 - 5.0 g/dL Final  . AST 05/25/2015 23  15 - 41 U/L Final  . ALT 05/25/2015 11* 14 - 54 U/L Final  . Alkaline Phosphatase 05/25/2015 53  38 -  126 U/L Final  . Total Bilirubin 05/25/2015 0.2* 0.3 - 1.2 mg/dL Final  . GFR calc non Af Amer 05/25/2015 >60  >60 mL/min Final  . GFR calc Af Amer 05/25/2015 >60  >60 mL/min Final   Comment: (NOTE) The eGFR has been calculated using the CKD EPI equation. This calculation has not been validated in all clinical situations. eGFR's persistently <60 mL/min signify possible Chronic Kidney Disease.   . Anion gap 05/25/2015 6  5 - 15 Final  . Magnesium 05/25/2015 1.8  1.7 - 2.4 mg/dL Final  . CA 125 05/25/2015 26.8  0.0 - 38.1 U/mL Final   Comment: (NOTE) [employer] ECLIA methodology Performed At: [facility] [place], [place] [id number] Lindon Romp MD ZY:[id number]           ASSESSMENT: 82 year old lady with recurrent ovarian cancer. CT scan and clinical examination shows progressive disease. Start patient on Taxol and Avastin therapy because of progressive swelling of right lower extremity as well as pain \  Tumor markers have been reviewed and shows continuing downward trend Continue Avastin therapy

## 2015-06-15 ENCOUNTER — Inpatient Hospital Stay (HOSPITAL_BASED_OUTPATIENT_CLINIC_OR_DEPARTMENT_OTHER): Payer: Medicare HMO | Admitting: Oncology

## 2015-06-15 ENCOUNTER — Encounter: Payer: Self-pay | Admitting: Oncology

## 2015-06-15 ENCOUNTER — Other Ambulatory Visit: Payer: Self-pay

## 2015-06-15 ENCOUNTER — Inpatient Hospital Stay: Payer: Medicare HMO

## 2015-06-15 ENCOUNTER — Inpatient Hospital Stay: Payer: Medicare HMO | Attending: Oncology

## 2015-06-15 VITALS — BP 132/75 | HR 56

## 2015-06-15 VITALS — BP 133/85 | HR 66 | Temp 95.8°F | Resp 18 | Wt 138.2 lb

## 2015-06-15 DIAGNOSIS — I10 Essential (primary) hypertension: Secondary | ICD-10-CM

## 2015-06-15 DIAGNOSIS — C5700 Malignant neoplasm of unspecified fallopian tube: Secondary | ICD-10-CM

## 2015-06-15 DIAGNOSIS — R32 Unspecified urinary incontinence: Secondary | ICD-10-CM | POA: Insufficient documentation

## 2015-06-15 DIAGNOSIS — Z9221 Personal history of antineoplastic chemotherapy: Secondary | ICD-10-CM | POA: Diagnosis not present

## 2015-06-15 DIAGNOSIS — Z7901 Long term (current) use of anticoagulants: Secondary | ICD-10-CM | POA: Diagnosis not present

## 2015-06-15 DIAGNOSIS — R410 Disorientation, unspecified: Secondary | ICD-10-CM | POA: Diagnosis not present

## 2015-06-15 DIAGNOSIS — Z86718 Personal history of other venous thrombosis and embolism: Secondary | ICD-10-CM | POA: Diagnosis not present

## 2015-06-15 DIAGNOSIS — Z5112 Encounter for antineoplastic immunotherapy: Secondary | ICD-10-CM | POA: Diagnosis present

## 2015-06-15 DIAGNOSIS — R309 Painful micturition, unspecified: Secondary | ICD-10-CM

## 2015-06-15 DIAGNOSIS — Z8673 Personal history of transient ischemic attack (TIA), and cerebral infarction without residual deficits: Secondary | ICD-10-CM

## 2015-06-15 DIAGNOSIS — C774 Secondary and unspecified malignant neoplasm of inguinal and lower limb lymph nodes: Secondary | ICD-10-CM | POA: Insufficient documentation

## 2015-06-15 DIAGNOSIS — Z8543 Personal history of malignant neoplasm of ovary: Secondary | ICD-10-CM | POA: Diagnosis not present

## 2015-06-15 DIAGNOSIS — Z923 Personal history of irradiation: Secondary | ICD-10-CM | POA: Diagnosis not present

## 2015-06-15 DIAGNOSIS — M7989 Other specified soft tissue disorders: Secondary | ICD-10-CM | POA: Diagnosis not present

## 2015-06-15 DIAGNOSIS — C569 Malignant neoplasm of unspecified ovary: Secondary | ICD-10-CM

## 2015-06-15 LAB — CBC WITH DIFFERENTIAL/PLATELET
Basophils Absolute: 0.1 10*3/uL (ref 0–0.1)
Basophils Relative: 1 %
Eosinophils Absolute: 0 10*3/uL (ref 0–0.7)
Eosinophils Relative: 1 %
HCT: 33.2 % — ABNORMAL LOW (ref 35.0–47.0)
HEMOGLOBIN: 11 g/dL — AB (ref 12.0–16.0)
LYMPHS ABS: 1.6 10*3/uL (ref 1.0–3.6)
Lymphocytes Relative: 35 %
MCH: 28.5 pg (ref 26.0–34.0)
MCHC: 33.1 g/dL (ref 32.0–36.0)
MCV: 86.1 fL (ref 80.0–100.0)
Monocytes Absolute: 0.7 10*3/uL (ref 0.2–0.9)
Monocytes Relative: 16 %
NEUTROS PCT: 47 %
Neutro Abs: 2.2 10*3/uL (ref 1.4–6.5)
Platelets: 414 10*3/uL (ref 150–440)
RBC: 3.86 MIL/uL (ref 3.80–5.20)
RDW: 17.9 % — ABNORMAL HIGH (ref 11.5–14.5)
WBC: 4.7 10*3/uL (ref 3.6–11.0)

## 2015-06-15 LAB — PROTEIN, URINE, RANDOM: Total Protein, Urine: 14 mg/dL

## 2015-06-15 LAB — COMPREHENSIVE METABOLIC PANEL
ALK PHOS: 55 U/L (ref 38–126)
ALT: 16 U/L (ref 14–54)
AST: 24 U/L (ref 15–41)
Albumin: 3.7 g/dL (ref 3.5–5.0)
Anion gap: 6 (ref 5–15)
BUN: 19 mg/dL (ref 6–20)
CALCIUM: 8.8 mg/dL — AB (ref 8.9–10.3)
CO2: 28 mmol/L (ref 22–32)
CREATININE: 0.9 mg/dL (ref 0.44–1.00)
Chloride: 103 mmol/L (ref 101–111)
GFR, EST NON AFRICAN AMERICAN: 57 mL/min — AB (ref >60)
Glucose, Bld: 122 mg/dL — ABNORMAL HIGH (ref 65–99)
Potassium: 3.9 mmol/L (ref 3.5–5.1)
Sodium: 137 mmol/L (ref 135–145)
Total Bilirubin: 0.2 mg/dL — ABNORMAL LOW (ref 0.3–1.2)
Total Protein: 7.1 g/dL (ref 6.5–8.1)

## 2015-06-15 LAB — MAGNESIUM: MAGNESIUM: 2.1 mg/dL (ref 1.7–2.4)

## 2015-06-15 MED ORDER — SODIUM CHLORIDE 0.9 % IJ SOLN
10.0000 mL | INTRAMUSCULAR | Status: DC | PRN
  Administered 2015-06-15: 10 mL
  Filled 2015-06-15: qty 10

## 2015-06-15 MED ORDER — SODIUM CHLORIDE 0.9 % IV SOLN
10.0000 mg/kg | Freq: Once | INTRAVENOUS | Status: AC
  Administered 2015-06-15: 625 mg via INTRAVENOUS
  Filled 2015-06-15: qty 20

## 2015-06-15 MED ORDER — HEPARIN SOD (PORK) LOCK FLUSH 100 UNIT/ML IV SOLN
500.0000 [IU] | Freq: Once | INTRAVENOUS | Status: AC | PRN
  Administered 2015-06-15: 500 [IU]
  Filled 2015-06-15: qty 5

## 2015-06-15 MED ORDER — SODIUM CHLORIDE 0.9 % IV SOLN
Freq: Once | INTRAVENOUS | Status: AC
  Administered 2015-06-15: 16:00:00 via INTRAVENOUS
  Filled 2015-06-15: qty 1000

## 2015-06-15 MED ORDER — ONDANSETRON HCL 4 MG PO TABS: ORAL_TABLET | ORAL | Status: DC

## 2015-06-15 NOTE — Progress Notes (Signed)
Patient accompanied by daughter today who states "patient is a little out of it today".  Requesting refill for Zofran.

## 2015-06-16 LAB — CA 125: CA 125: 27.5 U/mL (ref 0.0–38.1)

## 2015-06-17 ENCOUNTER — Encounter: Payer: Self-pay | Admitting: Oncology

## 2015-06-17 NOTE — Progress Notes (Signed)
[place] @ [facility] [phone]) [phone]  Fax:(336) [place] OB: [birth year]  MR#: [id number]  JYN#:[id number]  Patient Care Team: Ezequiel Kayser, [name]  CHIEF COMPLAINT:  Chief Complaint  Patient presents with  . Ovarian Cancer   Oncology History   . 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  Adequate TRS, no macroscopic residual. IP/IV chemotherapy with DDP and paclitaxel on GOG protocol, chemo and Bev consolidation completed in 03/2011 2. 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 November 14, 2011- Patient was started on carboplatin and gemcitabine 4. Finished 6 cycles of chemotherapy in April of 2014 with carboplatin and gemcitabine. Tolerance was fairly good except for neutropenia and thrombocytopenia. 5.recurrent disease by CT scan February of 2015 6.radiation therapy to pelvis  May of 2015 7.upper extremity  . and deep vein thrombosis associated with port.(June of 2015) patient started on   [place] had port removed and had   thrombectomy September 06, 2013. 9.progressing disease in the right inguinal area 10.  Patient was started on Taxol and Avastin on a weekly basis 11.  Patient is   On chemotherapy holidays because of persistent urinary problem   12Recurrent carcinoma of fallopian tube.  Presently on observation    13.  Progressive right inguinal lymph node enlargement so patient was started on Taxol and Avastin (January, 2016) 14.  Somatic mutation for BRCA1 was negative (February, 2017) Germline mutation for BRCA1 was negative in the past    INTERVAL HISTORY:  82 year old lady with recurrent ovarian cancer treated with chemotherapy and radiation therapy to the right inguinal area.  Complains of swelling of the right lower extremity where patient had a significant injury.  Tenderness present.  No nausea.  No vomiting.  Patient  continues to have problem with bladder being evaluated by Dr. Richrd Humbles, gynecologist.  No chills.  No fever.  No abdominal pain. She has problem with numbness and tingling in both lower extremity and upper extremity which continues to bother the patient in spite of taking Neurontin.Marland Kitchen PATIENT  is here for ongoing evaluation and continuation of treatment. Swelling in the right lower extremity persist but has not increased.  Patient's urinary problem has improved.  No hematuria or hematemesis.  RIGHT INGUINAL pain has improved She is here for ongoing evaluation regarding carcinoma of ovary.  Recurrent disease on maintenance Avastin.  Swelling in the right lower extremity persist but has not increased.  Pain has improved.  No nausea no vomiting no diarrhea Patient accompanied by daughter today who states "patient is a little out of it today". Requesting refill for Zofran    REVIEW OF SYSTEMS:   GENERAL:  Feels good.  Active.  No fevers, sweats or weight loss. Recent had a recent fall  PERFORMANCE STATUS (ECOG):01 HEENT:  No visual changes, runny nose, sore throat, mouth sores or tenderness. Lungs: No shortness of breath or cough.  No hemoptysis. Cardiac:  No chest pain, palpitations, orthopnea, or PND. GI:  No nausea, vomiting, diarrhea, constipation, melena or hematochezia. GU:  Continues to have incontinence of urine.  Intermittent burning.  Recently had cystoscopy which revealed no evidence of malignancy.  Musculoskeletal:  No back pain.  No joint pain.  No muscle tenderness.Swelling of the right lower extremity ecchymosis.  To follow  Extremities:  No pain or swelling. Skin:  No rashes or skin changes. Neuro:  No headache, numbness or  weakness, balance or coordination issues. Endocrine:  No diabetes, thyroid issues, hot flashes or night sweats. Psych:  No mood changes, depression or anxiety. Pain:  No focal pain. Review of systems:  All other systems reviewed and found to be negative. As  per HPI. Otherwise, a complete review of systems is negatve.    Smoking History: Smoking History Never Smoked.  PFSH: Family History: noncontributory  Comments: no maligancies  Social History: negative alcohol, negative tobacco  Additional Past Medical and Surgical History: Med:   HTN for 9 years             celiac disease, not active,              seasonal allergies              arthritis of the small joints,  TIA 20 years ago no further symptoms.    Mitral wall prolapse without any significant cardiac arrhythmias or congestive heart failure.    Surg:     1963  hysterectomy for descensus               1996  cholecystectomy through the scope,               2000  sling procedure for incontinence (Dr. Kathyrn Lass)   [place]:  Patient does have advance healthcare directive, Patient   does not desire to make any changes HEALTH MAINTENANCE: Social History  Substance Use Topics  . Smoking status: Never Smoker   . Smokeless tobacco: None  . Alcohol Use: No      Allergies  Allergen Reactions  . Benadryl [Diphenhydramine] Shortness Of Breath    Sob, rash, swelling  . Alendronate Sodium     Other reaction(s): Other (See Comments) Dysphagia  . Duloxetine Hcl Diarrhea  . Risedronate Sodium Rash    Aching, dysphagia  . Nitrofurantoin Other (See Comments)  . Lovastatin     Other reaction(s): Other (See Comments) GI upset  . Sulfa Antibiotics Rash     OBJECTIVE:  Filed Vitals:   06/15/15 1415  BP: 133/85  Pulse: 66  Temp: 95.8 F (35.4 C)  Resp: 18     Body mass index is 26.99 kg/(m^2).    ECOG FS:1 - Symptomatic but completely ambulatory  PHYSICAL EXAM: General  status: Performance status is good.  Patient has not lost significant weight HEENT: No evidence of stomatitis. Sclera and conjunctivae :: No jaundice.   pale looking. Lungs: Air  entry equal on both sides.  No rhonchi.  No rales.  Cardiac: Heart sounds are normal.  No pericardial rub.  No  murmur. Lymphatic system: Cervical, axillary, inguinal, lymph nodes not palpable Right inguinal lymph node has increased in size  GI: Abdomen is soft.  No ascites.  Liver spleen not palpable.  No tenderness.  Bowel sounds are within normal limit Lower extremity: Right lower extremity ecchymosis swelling.  Is improved but tenderness persist Neurological system: Higher functions, cranial nerves intact no evidence of peripheral neuropathy. Skin: No rash.  No ecchymosis.Marland Kitchen\   LAB RESULTS:  Orders Only on 06/15/2015  Component Date Value Ref Range Status  . Total Protein, Urine 06/15/2015 14   Final   NO NORMAL RANGE ESTABLISHED FOR THIS TEST  Appointment on 06/15/2015  Component Date Value Ref Range Status  . WBC 06/15/2015 4.7  3.6 - 11.0 K/uL Final  . RBC 06/15/2015 3.86  3.80 - 5.20 MIL/uL Final  . Hemoglobin 06/15/2015 11.0* 12.0 - 16.0 g/dL Final  .  HCT 06/15/2015 33.2* 35.0 - 47.0 % Final  . MCV 06/15/2015 86.1  80.0 - 100.0 fL Final  . MCH 06/15/2015 28.5  26.0 - 34.0 pg Final  . MCHC 06/15/2015 33.1  32.0 - 36.0 g/dL Final  . RDW 06/15/2015 17.9* 11.5 - 14.5 % Final  . Platelets 06/15/2015 414  150 - 440 K/uL Final  . Neutrophils Relative % 06/15/2015 47   Final  . Neutro Abs 06/15/2015 2.2  1.4 - 6.5 K/uL Final  . Lymphocytes Relative 06/15/2015 35   Final  . Lymphs Abs 06/15/2015 1.6  1.0 - 3.6 K/uL Final  . Monocytes Relative 06/15/2015 16   Final  . Monocytes Absolute 06/15/2015 0.7  0.2 - 0.9 K/uL Final  . Eosinophils Relative 06/15/2015 1   Final  . Eosinophils Absolute 06/15/2015 0.0  0 - 0.7 K/uL Final  . Basophils Relative 06/15/2015 1   Final  . Basophils Absolute 06/15/2015 0.1  0 - 0.1 K/uL Final  . Sodium 06/15/2015 137  135 - 145 mmol/L Final  . Potassium 06/15/2015 3.9  3.5 - 5.1 mmol/L Final  . Chloride 06/15/2015 103  101 - 111 mmol/L Final  . CO2 06/15/2015 28  22 - 32 mmol/L Final  . Glucose, Bld 06/15/2015 122* 65 - 99 mg/dL Final  . BUN 06/15/2015 19   6 - 20 mg/dL Final  . Creatinine, Ser 06/15/2015 0.90  0.44 - 1.00 mg/dL Final  . Calcium 06/15/2015 8.8* 8.9 - 10.3 mg/dL Final  . Total Protein 06/15/2015 7.1  6.5 - 8.1 g/dL Final  . Albumin 06/15/2015 3.7  3.5 - 5.0 g/dL Final  . AST 06/15/2015 24  15 - 41 U/L Final  . ALT 06/15/2015 16  14 - 54 U/L Final  . Alkaline Phosphatase 06/15/2015 55  38 - 126 U/L Final  . Total Bilirubin 06/15/2015 0.2* 0.3 - 1.2 mg/dL Final  . GFR calc non Af Amer 06/15/2015 57* >60 mL/min Final  . GFR calc Af Amer 06/15/2015 >60  >60 mL/min Final   Comment: (NOTE) The eGFR has been calculated using the CKD EPI equation. This calculation has not been validated in all clinical situations. eGFR's persistently <60 mL/min signify possible Chronic Kidney Disease.   . Anion gap 06/15/2015 6  5 - 15 Final  . Magnesium 06/15/2015 2.1  1.7 - 2.4 mg/dL Final  . CA 125 06/15/2015 27.5  0.0 - 38.1 U/mL Final   Comment: (NOTE) Roche ECLIA methodology Performed At: [facility] [place], [place] [id number] Lindon Romp MD RW:[id number]           ASSESSMENT: 82 year old lady with recurrent ovarian cancer. Patient is presently on maintenance Avastin.  CA-125 has been stable We will continue with Avastin.  On clinical ground there is no evidence of recurrent or progressive disease. Last CT scan was done in December and will be repeated in June of 2017 Continue with Avastin

## 2015-07-04 ENCOUNTER — Telehealth: Payer: Self-pay | Admitting: *Deleted

## 2015-07-04 MED ORDER — CIPROFLOXACIN HCL 500 MG PO TABS: 500.0000 mg | ORAL_TABLET | Freq: Two times a day (BID) | ORAL | Status: DC

## 2015-07-04 NOTE — Telephone Encounter (Signed)
States she has another UTI and is asking that Cipro be called in for her. Please advise.

## 2015-07-04 NOTE — Telephone Encounter (Signed)
Cipro 500 mg bid e scribed per VO L Herring, AGNP-C. Pt informed

## 2015-07-06 ENCOUNTER — Inpatient Hospital Stay (HOSPITAL_BASED_OUTPATIENT_CLINIC_OR_DEPARTMENT_OTHER): Payer: Medicare HMO | Admitting: Oncology

## 2015-07-06 ENCOUNTER — Encounter: Payer: Self-pay | Admitting: Oncology

## 2015-07-06 ENCOUNTER — Inpatient Hospital Stay: Payer: Medicare HMO

## 2015-07-06 ENCOUNTER — Inpatient Hospital Stay: Payer: Medicare HMO | Attending: Oncology

## 2015-07-06 VITALS — BP 147/85 | HR 64 | Temp 96.0°F | Resp 18 | Wt 137.4 lb

## 2015-07-06 DIAGNOSIS — C774 Secondary and unspecified malignant neoplasm of inguinal and lower limb lymph nodes: Secondary | ICD-10-CM | POA: Insufficient documentation

## 2015-07-06 DIAGNOSIS — C5700 Malignant neoplasm of unspecified fallopian tube: Secondary | ICD-10-CM | POA: Diagnosis not present

## 2015-07-06 DIAGNOSIS — C5701 Malignant neoplasm of right fallopian tube: Secondary | ICD-10-CM | POA: Diagnosis not present

## 2015-07-06 DIAGNOSIS — R5383 Other fatigue: Secondary | ICD-10-CM | POA: Insufficient documentation

## 2015-07-06 DIAGNOSIS — B9620 Unspecified Escherichia coli [E. coli] as the cause of diseases classified elsewhere: Secondary | ICD-10-CM | POA: Insufficient documentation

## 2015-07-06 DIAGNOSIS — Z86718 Personal history of other venous thrombosis and embolism: Secondary | ICD-10-CM | POA: Insufficient documentation

## 2015-07-06 DIAGNOSIS — Z923 Personal history of irradiation: Secondary | ICD-10-CM | POA: Diagnosis not present

## 2015-07-06 DIAGNOSIS — Z87440 Personal history of urinary (tract) infections: Secondary | ICD-10-CM | POA: Diagnosis not present

## 2015-07-06 DIAGNOSIS — R32 Unspecified urinary incontinence: Secondary | ICD-10-CM

## 2015-07-06 DIAGNOSIS — Z8543 Personal history of malignant neoplasm of ovary: Secondary | ICD-10-CM | POA: Insufficient documentation

## 2015-07-06 DIAGNOSIS — Z5112 Encounter for antineoplastic immunotherapy: Secondary | ICD-10-CM | POA: Insufficient documentation

## 2015-07-06 DIAGNOSIS — Z8673 Personal history of transient ischemic attack (TIA), and cerebral infarction without residual deficits: Secondary | ICD-10-CM

## 2015-07-06 DIAGNOSIS — N3020 Other chronic cystitis without hematuria: Secondary | ICD-10-CM

## 2015-07-06 DIAGNOSIS — R1031 Right lower quadrant pain: Secondary | ICD-10-CM

## 2015-07-06 DIAGNOSIS — M7989 Other specified soft tissue disorders: Secondary | ICD-10-CM | POA: Diagnosis not present

## 2015-07-06 DIAGNOSIS — Z9221 Personal history of antineoplastic chemotherapy: Secondary | ICD-10-CM

## 2015-07-06 DIAGNOSIS — Z87828 Personal history of other (healed) physical injury and trauma: Secondary | ICD-10-CM | POA: Diagnosis not present

## 2015-07-06 DIAGNOSIS — R309 Painful micturition, unspecified: Secondary | ICD-10-CM | POA: Diagnosis not present

## 2015-07-06 DIAGNOSIS — N390 Urinary tract infection, site not specified: Secondary | ICD-10-CM | POA: Insufficient documentation

## 2015-07-06 LAB — CBC WITH DIFFERENTIAL/PLATELET
BASOS ABS: 0 10*3/uL (ref 0–0.1)
Basophils Relative: 1 %
EOS ABS: 0 10*3/uL (ref 0–0.7)
EOS PCT: 1 %
HCT: 34.5 % — ABNORMAL LOW (ref 35.0–47.0)
Hemoglobin: 11.3 g/dL — ABNORMAL LOW (ref 12.0–16.0)
LYMPHS PCT: 37 %
Lymphs Abs: 1.7 10*3/uL (ref 1.0–3.6)
MCH: 28.7 pg (ref 26.0–34.0)
MCHC: 32.9 g/dL (ref 32.0–36.0)
MCV: 87.3 fL (ref 80.0–100.0)
Monocytes Absolute: 0.8 10*3/uL (ref 0.2–0.9)
Monocytes Relative: 16 %
Neutro Abs: 2.1 10*3/uL (ref 1.4–6.5)
Neutrophils Relative %: 45 %
PLATELETS: 403 10*3/uL (ref 150–440)
RBC: 3.95 MIL/uL (ref 3.80–5.20)
RDW: 18.4 % — ABNORMAL HIGH (ref 11.5–14.5)
WBC: 4.7 10*3/uL (ref 3.6–11.0)

## 2015-07-06 LAB — COMPREHENSIVE METABOLIC PANEL
ALT: 16 U/L (ref 14–54)
AST: 25 U/L (ref 15–41)
Albumin: 3.9 g/dL (ref 3.5–5.0)
Alkaline Phosphatase: 57 U/L (ref 38–126)
Anion gap: 6 (ref 5–15)
BUN: 20 mg/dL (ref 6–20)
CHLORIDE: 105 mmol/L (ref 101–111)
CO2: 26 mmol/L (ref 22–32)
CREATININE: 0.91 mg/dL (ref 0.44–1.00)
Calcium: 8.9 mg/dL (ref 8.9–10.3)
GFR calc Af Amer: >60 mL/min (ref >60)
GFR calc non Af Amer: 56 mL/min — ABNORMAL LOW (ref >60)
GLUCOSE: 101 mg/dL — AB (ref 65–99)
Potassium: 3.8 mmol/L (ref 3.5–5.1)
SODIUM: 137 mmol/L (ref 135–145)
Total Bilirubin: 0.5 mg/dL (ref 0.3–1.2)
Total Protein: 7.3 g/dL (ref 6.5–8.1)

## 2015-07-06 MED ORDER — SODIUM CHLORIDE 0.9 % IV SOLN
10.0000 mg/kg | Freq: Once | INTRAVENOUS | Status: AC
  Administered 2015-07-06: 625 mg via INTRAVENOUS
  Filled 2015-07-06: qty 25

## 2015-07-06 MED ORDER — SODIUM CHLORIDE 0.9 % IJ SOLN
10.0000 mL | INTRAMUSCULAR | Status: DC | PRN
  Administered 2015-07-06: 10 mL
  Filled 2015-07-06: qty 10

## 2015-07-06 MED ORDER — HEPARIN SOD (PORK) LOCK FLUSH 100 UNIT/ML IV SOLN
500.0000 [IU] | Freq: Once | INTRAVENOUS | Status: AC | PRN
  Administered 2015-07-06: 500 [IU]
  Filled 2015-07-06: qty 5

## 2015-07-06 MED ORDER — SODIUM CHLORIDE 0.9 % IV SOLN
Freq: Once | INTRAVENOUS | Status: AC
  Administered 2015-07-06: 15:00:00 via INTRAVENOUS
  Filled 2015-07-06: qty 1000

## 2015-07-06 NOTE — Progress Notes (Signed)
Patient states she has a bladder infection and is currently on Cipro.

## 2015-07-06 NOTE — Progress Notes (Signed)
[place] @ [facility] Telephone:([phone]  Fax:(336) [place] OB: 1928/07/07  MR#: [id number]  OVZ#:[id number]  Patient Care Team: Ezequiel Kayser, [name]  CHIEF COMPLAINT:  Chief Complaint  Patient presents with  . Ovarian Cancer   Oncology History   . 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  Adequate TRS, no macroscopic residual. IP/IV chemotherapy with DDP and paclitaxel on GOG protocol, chemo and Bev consolidation completed in 03/2011 2. 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 November 14, 2011- Patient was started on carboplatin and gemcitabine 4. Finished 6 cycles of chemotherapy in April of 2014 with carboplatin and gemcitabine. Tolerance was fairly good except for neutropenia and thrombocytopenia. 5.recurrent disease by CT scan February of 2015 6.radiation therapy to pelvis  May of 2015 7.upper extremity  . and deep vein thrombosis associated with port.(June of 2015) patient started on   [place] had port removed and had   thrombectomy September 06, 2013. 9.progressing disease in the right inguinal area 10.  Patient was started on Taxol and Avastin on a weekly basis 11.  Patient is   On chemotherapy holidays because of persistent urinary problem   12Recurrent carcinoma of fallopian tube.  Presently on observation    13.  Progressive right inguinal lymph node enlargement so patient was started on Taxol and Avastin (January, 2016) 14.  Somatic mutation for BRCA1 was negative (February, 2017) Germline mutation for BRCA1 was negative in the past    INTERVAL HISTORY:  82 year old lady with recurrent ovarian cancer treated with chemotherapy and radiation therapy to the right inguinal area.  Complains of swelling of the right lower extremity where patient had a significant injury.  Tenderness present.  No nausea.  No vomiting.  Patient  continues to have problem with bladder being evaluated by Dr. Richrd Humbles, gynecologist.  No chills.  No fever.  No abdominal pain. She has problem with numbness and tingling in both lower extremity and upper extremity which continues to bother the patient in spite of taking Neurontin.Nicole Clayton PATIENT  is here for ongoing evaluation and continuation of treatment. Swelling in the right lower extremity persist but has not increased.  Patient's urinary problem has improved.  No hematuria or hematemesis.  RIGHT INGUINAL pain has improved Patient is here for ongoing evaluation and continuation of treatment. No abdominal pain no nausea no vomiting no diarrhea.    REVIEW OF SYSTEMS:   GENERAL:  Feels good.  Active.  No fevers, sweats or weight loss. Recent had a recent fall  PERFORMANCE STATUS (ECOG):01 HEENT:  No visual changes, runny nose, sore throat, mouth sores or tenderness. Lungs: No shortness of breath or cough.  No hemoptysis. Cardiac:  No chest pain, palpitations, orthopnea, or PND. GI:  No nausea, vomiting, diarrhea, constipation, melena or hematochezia. GU:  Continues to have incontinence of urine.  Intermittent burning.  Recently had cystoscopy which revealed no evidence of malignancy.  Musculoskeletal:  No back pain.  No joint pain.  No muscle tenderness.Swelling of the right lower extremity ecchymosis.  To follow  Extremities:  No pain or swelling. Skin:  No rashes or skin changes. Neuro:  No headache, numbness or weakness, balance or coordination issues. Endocrine:  No diabetes, thyroid issues, hot flashes or night sweats. Psych:  No mood changes, depression or anxiety. Pain:  No focal pain. Review of systems:  All other systems reviewed and found  to be negative. As per HPI. Otherwise, a complete review of systems is negatve.    Smoking History: Smoking History Never Smoked.  PFSH: Family History: noncontributory  Comments: no maligancies  Social History: negative alcohol,  negative tobacco  Additional Past Medical and Surgical History: Med:   HTN for 9 years             celiac disease, not active,              seasonal allergies              arthritis of the small joints,  TIA 20 years ago no further symptoms.    Mitral wall prolapse without any significant cardiac arrhythmias or congestive heart failure.    Surg:     1963  hysterectomy for descensus               1996  cholecystectomy through the scope,               2000  sling procedure for incontinence (Dr. Kathyrn Lass)   [place]:  Patient does have advance healthcare directive, Patient   does not desire to make any changes HEALTH MAINTENANCE: Social History  Substance Use Topics  . Smoking status: Never Smoker   . Smokeless tobacco: None  . Alcohol Use: No      Allergies  Allergen Reactions  . Benadryl [Diphenhydramine] Shortness Of Breath    Sob, rash, swelling  . Alendronate Sodium     Other reaction(s): Other (See Comments) Dysphagia  . Duloxetine Hcl Diarrhea  . Risedronate Sodium Rash    Aching, dysphagia  . Nitrofurantoin Other (See Comments)  . Lovastatin     Other reaction(s): Other (See Comments) GI upset  . Sulfa Antibiotics Rash     OBJECTIVE:  Filed Vitals:   07/06/15 1407  BP: 147/85  Pulse: 64  Temp: 96 F (35.6 C)  Resp: 18     Body mass index is 26.83 kg/(m^2).    ECOG FS:1 - Symptomatic but completely ambulatory  PHYSICAL EXAM: General  status: Performance status is good.  Patient has not lost significant weight HEENT: No evidence of stomatitis. Sclera and conjunctivae :: No jaundice.   pale looking. Lungs: Air  entry equal on both sides.  No rhonchi.  No rales.  Cardiac: Heart sounds are normal.  No pericardial rub.  No murmur. Lymphatic system: Cervical, axillary, inguinal, lymph nodes not palpable Right inguinal lymph node has increased in size  GI: Abdomen is soft.  No ascites.  Liver spleen not palpable.  No tenderness.  Bowel sounds are  within normal limit Lower extremity: Right lower extremity ecchymosis swelling.  Is improved but tenderness persist Neurological system: Higher functions, cranial nerves intact no evidence of peripheral neuropathy. Skin: No rash.  No ecchymosis.Nicole Clayton\   LAB RESULTS:  Infusion on 07/06/2015  Component Date Value Ref Range Status  . WBC 07/06/2015 4.7  3.6 - 11.0 K/uL Final  . RBC 07/06/2015 3.95  3.80 - 5.20 MIL/uL Final  . Hemoglobin 07/06/2015 11.3* 12.0 - 16.0 g/dL Final  . HCT 07/06/2015 34.5* 35.0 - 47.0 % Final  . MCV 07/06/2015 87.3  80.0 - 100.0 fL Final  . MCH 07/06/2015 28.7  26.0 - 34.0 pg Final  . MCHC 07/06/2015 32.9  32.0 - 36.0 g/dL Final  . RDW 07/06/2015 18.4* 11.5 - 14.5 % Final  . Platelets 07/06/2015 403  150 - 440 K/uL Final  . Neutrophils Relative % 07/06/2015 45  Final  . Neutro Abs 07/06/2015 2.1  1.4 - 6.5 K/uL Final  . Lymphocytes Relative 07/06/2015 37   Final  . Lymphs Abs 07/06/2015 1.7  1.0 - 3.6 K/uL Final  . Monocytes Relative 07/06/2015 16   Final  . Monocytes Absolute 07/06/2015 0.8  0.2 - 0.9 K/uL Final  . Eosinophils Relative 07/06/2015 1   Final  . Eosinophils Absolute 07/06/2015 0.0  0 - 0.7 K/uL Final  . Basophils Relative 07/06/2015 1   Final  . Basophils Absolute 07/06/2015 0.0  0 - 0.1 K/uL Final  . Sodium 07/06/2015 137  135 - 145 mmol/L Final  . Potassium 07/06/2015 3.8  3.5 - 5.1 mmol/L Final  . Chloride 07/06/2015 105  101 - 111 mmol/L Final  . CO2 07/06/2015 26  22 - 32 mmol/L Final  . Glucose, Bld 07/06/2015 101* 65 - 99 mg/dL Final  . BUN 07/06/2015 20  6 - 20 mg/dL Final  . Creatinine, Ser 07/06/2015 0.91  0.44 - 1.00 mg/dL Final  . Calcium 07/06/2015 8.9  8.9 - 10.3 mg/dL Final  . Total Protein 07/06/2015 7.3  6.5 - 8.1 g/dL Final  . Albumin 07/06/2015 3.9  3.5 - 5.0 g/dL Final  . AST 07/06/2015 25  15 - 41 U/L Final  . ALT 07/06/2015 16  14 - 54 U/L Final  . Alkaline Phosphatase 07/06/2015 57  38 - 126 U/L Final  . Total  Bilirubin 07/06/2015 0.5  0.3 - 1.2 mg/dL Final  . GFR calc non Af Amer 07/06/2015 56* >60 mL/min Final  . GFR calc Af Amer 07/06/2015 >60  >60 mL/min Final   Comment: (NOTE) The eGFR has been calculated using the CKD EPI equation. This calculation has not been validated in all clinical situations. eGFR's persistently <60 mL/min signify possible Chronic Kidney Disease.   . Anion gap 07/06/2015 6  5 - 15 Final          ASSESSMENT: 82 year old lady with recurrent ovarian cancer. Patient is presently on maintenance Avastin.  Overall patient has remained stable.  Tumor markers have been reviewed.  Tumor markers remain stable.  Clinical examination shows stable disease.  Continue  Avastin.  2.  Multiple bladder infection

## 2015-07-07 LAB — CA 125: CA 125: 25.1 U/mL (ref 0.0–38.1)

## 2015-07-08 ENCOUNTER — Encounter: Payer: Self-pay | Admitting: Oncology

## 2015-07-14 ENCOUNTER — Other Ambulatory Visit: Payer: Medicare HMO

## 2015-07-14 ENCOUNTER — Telehealth (INDEPENDENT_AMBULATORY_CARE_PROVIDER_SITE_OTHER): Payer: Medicare HMO | Admitting: *Deleted

## 2015-07-14 DIAGNOSIS — R309 Painful micturition, unspecified: Secondary | ICD-10-CM

## 2015-07-14 LAB — POCT URINALYSIS DIPSTICK
Bilirubin, UA: NEGATIVE
GLUCOSE UA: NEGATIVE
KETONES UA: NEGATIVE
Nitrite, UA: POSITIVE
SPEC GRAV UA: 1.01
Urobilinogen, UA: 0.2
pH, UA: 7

## 2015-07-14 MED ORDER — LEVOFLOXACIN 250 MG PO TABS: 250.0000 mg | ORAL_TABLET | Freq: Every day | ORAL | Status: DC

## 2015-07-14 NOTE — Telephone Encounter (Signed)
Pos Uti- see u/a and culture- Per mns stop cipro and start levaquin- Jeneen Rinks (husband) aware.

## 2015-07-14 NOTE — Telephone Encounter (Signed)
Patient called and states she is having some trouble with her pessary. Patient states last night she had trouble urinating. Patient states that when she finally went to the bathroom it burn, and hurts. She has been on Cipro for a bladder infection. Patient is requesting call back. [phone].

## 2015-07-14 NOTE — Telephone Encounter (Signed)
Pt states she has had painful urination, urgency, chills off and on x 7 d.  NO fever, Vb, vd,or vo. Has been rxed cipro by Dr Jeb Levering- no u/a or culture obtained. Advised pt drop off urine sample.

## 2015-07-17 LAB — URINE CULTURE

## 2015-07-18 ENCOUNTER — Ambulatory Visit: Payer: Medicare HMO | Admitting: Obstetrics and Gynecology

## 2015-07-19 ENCOUNTER — Other Ambulatory Visit: Payer: Self-pay | Admitting: Obstetrics and Gynecology

## 2015-07-19 ENCOUNTER — Encounter: Payer: Self-pay | Admitting: *Deleted

## 2015-07-19 ENCOUNTER — Telehealth: Payer: Self-pay | Admitting: Obstetrics and Gynecology

## 2015-07-19 ENCOUNTER — Ambulatory Visit: Payer: Medicare HMO | Admitting: Obstetrics and Gynecology

## 2015-07-19 ENCOUNTER — Observation Stay
Admission: AD | Admit: 2015-07-19 | Discharge: 2015-07-21 | Disposition: A | Discharge status: Home / Self Care | Payer: Medicare HMO | Source: Ambulatory Visit | Attending: Obstetrics and Gynecology | Admitting: Obstetrics and Gynecology

## 2015-07-19 DIAGNOSIS — Z79891 Long term (current) use of opiate analgesic: Secondary | ICD-10-CM | POA: Diagnosis not present

## 2015-07-19 DIAGNOSIS — K219 Gastro-esophageal reflux disease without esophagitis: Secondary | ICD-10-CM | POA: Diagnosis not present

## 2015-07-19 DIAGNOSIS — Z8041 Family history of malignant neoplasm of ovary: Secondary | ICD-10-CM | POA: Diagnosis not present

## 2015-07-19 DIAGNOSIS — Z8544 Personal history of malignant neoplasm of other female genital organs: Secondary | ICD-10-CM | POA: Diagnosis not present

## 2015-07-19 DIAGNOSIS — Z7982 Long term (current) use of aspirin: Secondary | ICD-10-CM | POA: Diagnosis not present

## 2015-07-19 DIAGNOSIS — Z882 Allergy status to sulfonamides status: Secondary | ICD-10-CM | POA: Insufficient documentation

## 2015-07-19 DIAGNOSIS — Z79899 Other long term (current) drug therapy: Secondary | ICD-10-CM | POA: Insufficient documentation

## 2015-07-19 DIAGNOSIS — Z888 Allergy status to other drugs, medicaments and biological substances status: Secondary | ICD-10-CM | POA: Insufficient documentation

## 2015-07-19 DIAGNOSIS — Z95828 Presence of other vascular implants and grafts: Secondary | ICD-10-CM | POA: Insufficient documentation

## 2015-07-19 DIAGNOSIS — B952 Enterococcus as the cause of diseases classified elsewhere: Secondary | ICD-10-CM | POA: Diagnosis present

## 2015-07-19 DIAGNOSIS — Z9049 Acquired absence of other specified parts of digestive tract: Secondary | ICD-10-CM | POA: Insufficient documentation

## 2015-07-19 DIAGNOSIS — B9620 Unspecified Escherichia coli [E. coli] as the cause of diseases classified elsewhere: Secondary | ICD-10-CM | POA: Insufficient documentation

## 2015-07-19 DIAGNOSIS — I341 Nonrheumatic mitral (valve) prolapse: Secondary | ICD-10-CM | POA: Insufficient documentation

## 2015-07-19 DIAGNOSIS — N390 Urinary tract infection, site not specified: Secondary | ICD-10-CM | POA: Diagnosis not present

## 2015-07-19 DIAGNOSIS — Z7951 Long term (current) use of inhaled steroids: Secondary | ICD-10-CM | POA: Insufficient documentation

## 2015-07-19 DIAGNOSIS — Z90722 Acquired absence of ovaries, bilateral: Secondary | ICD-10-CM | POA: Insufficient documentation

## 2015-07-19 DIAGNOSIS — Z1624 Resistance to multiple antibiotics: Secondary | ICD-10-CM | POA: Insufficient documentation

## 2015-07-19 DIAGNOSIS — N3281 Overactive bladder: Secondary | ICD-10-CM | POA: Diagnosis not present

## 2015-07-19 DIAGNOSIS — Z8543 Personal history of malignant neoplasm of ovary: Secondary | ICD-10-CM | POA: Diagnosis not present

## 2015-07-19 DIAGNOSIS — Z90710 Acquired absence of both cervix and uterus: Secondary | ICD-10-CM | POA: Insufficient documentation

## 2015-07-19 HISTORY — DX: Nonrheumatic mitral (valve) prolapse: I34.1

## 2015-07-19 LAB — BASIC METABOLIC PANEL
Anion gap: 8 (ref 5–15)
BUN: 23 mg/dL — AB (ref 6–20)
CALCIUM: 8.8 mg/dL — AB (ref 8.9–10.3)
CO2: 26 mmol/L (ref 22–32)
CREATININE: 0.97 mg/dL (ref 0.44–1.00)
Chloride: 106 mmol/L (ref 101–111)
GFR calc non Af Amer: 52 mL/min — ABNORMAL LOW (ref >60)
Glucose, Bld: 95 mg/dL (ref 65–99)
Potassium: 3.8 mmol/L (ref 3.5–5.1)
SODIUM: 140 mmol/L (ref 135–145)

## 2015-07-19 LAB — CBC WITH DIFFERENTIAL/PLATELET
BASOS PCT: 2 %
Basophils Absolute: 0.1 10*3/uL (ref 0–0.1)
EOS ABS: 0.1 10*3/uL (ref 0–0.7)
EOS PCT: 2 %
HCT: 34.6 % — ABNORMAL LOW (ref 35.0–47.0)
HEMOGLOBIN: 11.2 g/dL — AB (ref 12.0–16.0)
Lymphocytes Relative: 34 %
Lymphs Abs: 1.7 10*3/uL (ref 1.0–3.6)
MCH: 28.3 pg (ref 26.0–34.0)
MCHC: 32.4 g/dL (ref 32.0–36.0)
MCV: 87.3 fL (ref 80.0–100.0)
MONOS PCT: 16 %
Monocytes Absolute: 0.8 10*3/uL (ref 0.2–0.9)
NEUTROS PCT: 46 %
Neutro Abs: 2.3 10*3/uL (ref 1.4–6.5)
PLATELETS: 380 10*3/uL (ref 150–440)
RBC: 3.96 MIL/uL (ref 3.80–5.20)
RDW: 18.2 % — ABNORMAL HIGH (ref 11.5–14.5)
WBC: 4.8 10*3/uL (ref 3.6–11.0)

## 2015-07-19 MED ORDER — PROMETHAZINE HCL 25 MG/ML IJ SOLN: 12.5000 mg | Freq: Four times a day (QID) | INTRAMUSCULAR | Status: DC | PRN

## 2015-07-19 MED ORDER — DOCUSATE SODIUM 100 MG PO CAPS: 100.0000 mg | ORAL_CAPSULE | Freq: Two times a day (BID) | ORAL | Status: DC | PRN

## 2015-07-19 MED ORDER — DOCUSATE SODIUM 100 MG PO CAPS: 100.0000 mg | ORAL_CAPSULE | Freq: Two times a day (BID) | ORAL | Status: DC

## 2015-07-19 MED ORDER — HYDROMORPHONE HCL 1 MG/ML IJ SOLN: 0.2000 mg | INTRAMUSCULAR | Status: DC | PRN

## 2015-07-19 MED ORDER — PRENATAL MULTIVITAMIN CH
1.0000 | ORAL_TABLET | Freq: Every day | ORAL | Status: DC
  Administered 2015-07-20: 1 via ORAL
  Filled 2015-07-19 (×3): qty 1

## 2015-07-19 MED ORDER — LACTATED RINGERS IV SOLN
INTRAVENOUS | Status: DC
  Administered 2015-07-19 – 2015-07-20 (×3): via INTRAVENOUS

## 2015-07-19 MED ORDER — IMIPENEM-CILASTATIN 500 MG IV SOLR
500.0000 mg | Freq: Three times a day (TID) | INTRAVENOUS | Status: DC
  Administered 2015-07-19 – 2015-07-21 (×5): 500 mg via INTRAVENOUS
  Filled 2015-07-19 (×8): qty 500

## 2015-07-19 MED ORDER — IBUPROFEN 600 MG PO TABS: 600.0000 mg | ORAL_TABLET | Freq: Four times a day (QID) | ORAL | Status: DC | PRN

## 2015-07-19 MED ORDER — PHENAZOPYRIDINE HCL 100 MG PO TABS: 200.0000 mg | ORAL_TABLET | Freq: Three times a day (TID) | ORAL | Status: DC

## 2015-07-19 MED ORDER — MAGNESIUM HYDROXIDE 400 MG/5ML PO SUSP: 30.0000 mL | Freq: Every day | ORAL | Status: DC | PRN

## 2015-07-19 NOTE — H&P (Signed)
GYNECOLOGY HISTORY AND PHYSICAL  Nicole Clayton is an 82 y.o. G6P3003 female with h/o ovarian cancer currently receiving chemotherapy (last infusion 07/06/2015) who was admitted for IV antibiotic treatment for UTI secondary to mult-drug resistant organism (E. Coli).  Patient of note has a h/o ovarian cancer, with current port placement.  Notes that she had not been "feeling very well lately". Has had urinary symptoms for almost 2 weeks .Had been treated with two different PO antibiotics empirically prior to culture returning, but patient's symptoms did not improve.  Recent culture noted multi-drug resistant organism. Patient has a h/o recurrent UTIs.   Pertinent Gynecological History: Menses: post-menopausal Bleeding: None    Menstrual History: Menarche age:~ age 11-13  No LMP recorded. Patient has had a hysterectomy.    Past Medical History  Diagnosis Date  . MI (mitral incompetence) 10/27/2014    Overview:  MODERATE   . Bladder infection, chronic 10/19/2011  . Cancer (HCC)     Ovarian   . 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 Claiborne Rigg and Delorise Shiner Choksi Overview:  Drs. Claiborne Rigg and [employer]   . GERD (gastroesophageal reflux disease)   . Tachycardia   . OAB (overactive bladder)   . Mitral valve prolapse     Family History  Problem Relation Age of Onset  . Breast cancer Neg Hx   . Colon cancer Neg Hx   . Diabetes Neg Hx   . Heart disease Neg Hx   . Ovarian cancer Other     Past Surgical History  Procedure Laterality Date  . Laparoscopic bilateral salpingo oopherectomy  2011  . Abdominal hysterectomy    . Cholecystectomy      Social History   Social History  . Marital Status: Married    Spouse Name: N/A  . Number of Children: N/A  . Years of Education: N/A   Occupational History  . Not on file.   Social History Main Topics  . Smoking status: Never Smoker   . Smokeless tobacco: Not on file  . Alcohol Use: No   . Drug Use: No  . Sexual Activity: Not Currently    Birth Control/ Protection: Surgical   Other Topics Concern  . Not on file   Social History Narrative    Allergies:  Allergies  Allergen Reactions  . Benadryl [Diphenhydramine] Shortness Of Breath    Sob, rash, swelling  . Alendronate Sodium     Other reaction(s): Other (See Comments) Dysphagia  . Duloxetine Hcl Diarrhea  . Risedronate Sodium Rash    Aching, dysphagia  . Nitrofurantoin Other (See Comments)  . Lovastatin     Other reaction(s): Other (See Comments) GI upset  . Sulfa Antibiotics Rash    Prescriptions prior to admission  Medication Sig Dispense Refill Last Dose  . acetaminophen (TYLENOL) 500 MG tablet Take 1 tablet (500 mg total) by mouth every 6 (six) hours as needed. 30 tablet 0 Taking  . Ascorbic Acid (VITAMIN C) 100 MG tablet Take 100 mg by mouth daily.   Taking  . aspirin EC 81 MG tablet Take 81 mg by mouth.   Taking  . cetirizine (ZYRTEC) 10 MG tablet Take by mouth.   Taking  . Cholecalciferol (VITAMIN D3) 2000 UNITS capsule Take by mouth.   Taking  . ciprofloxacin (CIPRO) 500 MG tablet Take 1 tablet (500 mg total) by mouth 2 (two) times daily. 20 tablet 0 Taking  . Cranberry 1000 MG  CAPS Take by mouth.   Taking  . docusate sodium (COLACE) 100 MG capsule Take 100 mg by mouth.   Taking  . fluticasone (FLONASE) 50 MCG/ACT nasal spray Place into the nose.   Taking  . gabapentin (NEURONTIN) 600 MG tablet Take 1 tablet (600 mg total) by mouth 3 (three) times daily. 180 tablet 3 Taking  . HYDROcodone-acetaminophen (NORCO/VICODIN) 5-325 MG tablet Take 1 tablet by mouth every 6 (six) hours as needed for moderate pain. 30 tablet 0 Taking  . levofloxacin (LEVAQUIN) 250 MG tablet Take 1 tablet (250 mg total) by mouth daily. 10 tablet 0   . loratadine (CLARITIN) 10 MG tablet Take by mouth.   Taking  . metoprolol succinate (TOPROL-XL) 50 MG 24 hr tablet Take 50 mg by mouth.   Taking  . metoprolol succinate  (TOPROL-XL) 50 MG 24 hr tablet    Taking  . nystatin ointment (MYCOSTATIN) Apply 1 application topically 2 (two) times daily. 30 g 0 Taking  . ondansetron (ZOFRAN) 4 MG tablet TAKE (1) TABLET BY MOUTH EVERY 8 HOURS AS NEEDED FOR NAUSEA/VOMITING 30 tablet 3 Taking  . pantoprazole (PROTONIX) 40 MG tablet Take 40 mg by mouth.   Taking  . triamcinolone ointment (KENALOG) 0.1 % Apply 1 application topically 2 (two) times daily. 30 g 0 Taking    Review of Systems  Constitutional: Positive for chills. Negative for fever, weight loss and malaise/fatigue.  HENT: Negative.   Eyes: Negative.   Respiratory: Negative for cough, shortness of breath and wheezing.   Cardiovascular: Negative for chest pain, palpitations and leg swelling.  Gastrointestinal: Negative for heartburn, nausea, vomiting and abdominal pain.  Genitourinary: Positive for dysuria and urgency. Negative for frequency, hematuria and flank pain.  Musculoskeletal: Negative for myalgias, back pain and neck pain.  Skin: Negative for itching and rash.  Neurological: Positive for weakness. Negative for dizziness, sensory change and focal weakness.  Endo/Heme/Allergies: Negative.   Psychiatric/Behavioral: Negative.     Blood pressure 141/63, pulse 60, temperature 97.5 F (36.4 C), temperature source Oral, resp. rate 18, height 5' (1.524 m), weight 137 lb 12.8 oz (62.506 kg), SpO2 97 %. Physical Exam  Constitutional: She is oriented to person, place, and time. She appears well-developed and well-nourished. No distress.  HENT:  Head: Normocephalic and atraumatic.  Nose: Nose normal.  Mouth/Throat: Oropharynx is clear and moist. No oropharyngeal exudate.  Eyes: Conjunctivae and EOM are normal. Right eye exhibits no discharge. Left eye exhibits no discharge. No scleral icterus.  Neck: Normal range of motion. Neck supple. No thyromegaly present.  Cardiovascular: Normal rate, regular rhythm and normal heart sounds.  Exam reveals no gallop and  no friction rub.   No murmur heard. Respiratory: Effort normal and breath sounds normal. No respiratory distress.  GI: Soft. Bowel sounds are normal. She exhibits no distension and no mass. There is no tenderness. There is no rebound and no guarding.  Genitourinary:  Exam deferred  Musculoskeletal: Normal range of motion. She exhibits no edema or tenderness.  Lymphadenopathy:    She has no cervical adenopathy.  Neurological: She is alert and oriented to person, place, and time.  Skin: Skin is warm and dry. No rash noted. She is not diaphoretic. No erythema. No pallor.  Psychiatric: She has a normal mood and affect. Her behavior is normal.    Results for orders placed or performed during the hospital encounter of 07/19/15 (from the past 24 hour(s))  Basic metabolic panel     Status: Abnormal  Collection Time: 07/19/15  6:36 PM  Result Value Ref Range   Sodium 140 135 - 145 mmol/L   Potassium 3.8 3.5 - 5.1 mmol/L   Chloride 106 101 - 111 mmol/L   CO2 26 22 - 32 mmol/L   Glucose, Bld 95 65 - 99 mg/dL   BUN 23 (H) 6 - 20 mg/dL   Creatinine, Ser 0.97 0.44 - 1.00 mg/dL   Calcium 8.8 (L) 8.9 - 10.3 mg/dL   GFR calc non Af Amer 52 (L) >60 mL/min   GFR calc Af Amer >60 >60 mL/min   Anion gap 8 5 - 15  CBC WITH DIFFERENTIAL     Status: Abnormal   Collection Time: 07/19/15  6:36 PM  Result Value Ref Range   WBC 4.8 3.6 - 11.0 K/uL   RBC 3.96 3.80 - 5.20 MIL/uL   Hemoglobin 11.2 (L) 12.0 - 16.0 g/dL   HCT 34.6 (L) 35.0 - 47.0 %   MCV 87.3 80.0 - 100.0 fL   MCH 28.3 26.0 - 34.0 pg   MCHC 32.4 32.0 - 36.0 g/dL   RDW 18.2 (H) 11.5 - 14.5 %   Platelets 380 150 - 440 K/uL   Neutrophils Relative % 46 %   Neutro Abs 2.3 1.4 - 6.5 K/uL   Lymphocytes Relative 34 %   Lymphs Abs 1.7 1.0 - 3.6 K/uL   Monocytes Relative 16 %   Monocytes Absolute 0.8 0.2 - 0.9 K/uL   Eosinophils Relative 2 %   Eosinophils Absolute 0.1 0 - 0.7 K/uL   Basophils Relative 2 %   Basophils Absolute 0.1 0 - 0.1  K/uL     Urine Culture 07/14/2015:   Urine Culture, Routine Final report (A)   Urine Culture result 1 Escherichia coli (A)   Comments: Greater than 100,000 colony forming units per mL   ANTIMICROBIAL SUSCEPTIBILITY Comment   Comments:   ** S = Susceptible; I = Intermediate; R = Resistant **           P = Positive; N = Negative        MICS are expressed in micrograms per mL   Antibiotic         RSLT#1  RSLT#2  RSLT#3  RSLT#4  Amoxicillin/Clavulanic Acid  R  Ampicillin           R  Cefazolin           R  Cefepime           R  Ceftriaxone         R  Cefuroxime         R  Cephalothin         R  Ciprofloxacin        R  Ertapenem         S  Gentamicin          R  Imipenem            S  Levofloxacin         R  Nitrofurantoin          I  Piperacillin            R  Tetracycline          I  Tobramycin         R  Trimethoprim/Sulfa   S           Assessment/Plan: 82 y.o. G55P3003 y.o. female with multi-drug resistant UTI (E. Coli), h/o ovarian cancer  currently with port placement.   1. Admit for IV antibiotic therapy.  Will start on Imipenem 500 mg q 8 hrs x 48 hours.  2. Will consult Hospitalist to assess if any further recommendations offered.   3. IVF at 125 ml. 4. Regular diet 5. Pyridium for urinary symptoms.  6. SCDs for DVT prophylaxis when not ambulating.     Rubie Maid, MD [facility]

## 2015-07-19 NOTE — Telephone Encounter (Signed)
Pt aware levaquin will not treate uti. Per [facility] she will need IV imienem 500mg  iv q 6 for several days. Dawn is getting room ready for pt. Will contact me when ready.

## 2015-07-19 NOTE — Telephone Encounter (Signed)
Pt stated that she had talked to [employer] earlier and would like her to call her back.

## 2015-07-19 NOTE — Telephone Encounter (Signed)
Pt aware I have a meeting and one of the girls up front will contact her before 5. (kc and rf are aware) If she had not heard from [place] to call office before we close today.

## 2015-07-20 DIAGNOSIS — N390 Urinary tract infection, site not specified: Secondary | ICD-10-CM | POA: Diagnosis not present

## 2015-07-20 MED ORDER — PHENAZOPYRIDINE HCL 100 MG PO TABS: 200.0000 mg | ORAL_TABLET | Freq: Three times a day (TID) | ORAL | Status: DC | PRN

## 2015-07-20 NOTE — Progress Notes (Signed)
Pastoral Care and prayer provided for patient. °

## 2015-07-20 NOTE — Progress Notes (Signed)
Subjective: Patient denies complaints.  Doing ok today.   Objective: I have reviewed patient's vital signs, intake and output and medications.  Temp:  [97.5 F (36.4 C)-97.8 F (36.6 C)] 97.8 F (36.6 C) (06/15 0532) Pulse Rate:  [60-70] 70 (06/15 0532) Resp:  [18] 18 (06/15 0532) BP: (120-141)/(63-74) 138/74 mmHg (06/15 0532) SpO2:  [94 %-97 %] 96 % (06/15 0532) Weight:  [137 lb 12.8 oz (62.506 kg)] 137 lb 12.8 oz (62.506 kg) (06/14 1732)  General: alert and no distress Resp: clear to auscultation bilaterally Cardio: regular rate and rhythm, S1, S2 normal, no murmur, click, rub or gallop GI: soft, non-tender; bowel sounds normal; no masses,  no organomegaly Extremities: extremities normal, atraumatic, no cyanosis or edema Vaginal Bleeding: none   Labs:  Lab Results  Component Value Date   WBC 4.8 07/19/2015   HGB 11.2* 07/19/2015   HCT 34.6* 07/19/2015   MCV 87.3 07/19/2015   PLT 380 07/19/2015    Lab Results  Component Value Date   CREATININE 0.97 07/19/2015   BUN 23* 07/19/2015   NA 140 07/19/2015   K 3.8 07/19/2015   CL 106 07/19/2015   CO2 26 07/19/2015   Urine Culture 07/14/2015:   Urine Culture, Routine Final report (A)   Urine Culture result 1 Escherichia coli (A)   Comments: Greater than 100,000 colony forming units per mL   ANTIMICROBIAL SUSCEPTIBILITY Comment   Comments:   ** S = Susceptible; I = Intermediate; R = Resistant **           P = Positive; N = Negative        MICS are expressed in micrograms per mL   Antibiotic         RSLT#1  RSLT#2  RSLT#3  RSLT#4  Amoxicillin/Clavulanic Acid  R  Ampicillin           R  Cefazolin           R  Cefepime           R  Ceftriaxone         R  Cefuroxime         R  Cephalothin         R  Ciprofloxacin        R  Ertapenem         S  Gentamicin         R  Imipenem           S  Levofloxacin         R  Nitrofurantoin       I  Piperacillin         R  Tetracycline         I  Tobramycin         R  Trimethoprim/Sulfa   S              Assessment/Plan: 82 y.o. G21P3003 y.o. female with multi-drug resistant UTI (E. Coli), h/o ovarian cancer currently with port placement.   1. Continue IV antibiotic therapy. Currently on Imipenem 500 mg q 8 hrs x 48 hours.  2. Will consult Hospitalist to assess if any further recommendations offered.  3. IVF at 125 ml. 4. Regular diet 5. Pyridium prn for urinary symptoms.  6. SCDs for DVT prophylaxis when not ambulating.    LOS: 1 day    Rubie Maid 07/20/2015, 8:02 AM

## 2015-07-20 NOTE — Clinical Social Work Note (Signed)
CSW was consulted by physician to arrange home health for intravenous antibiotic therapy. RN CM will make arrangements for patient to have home health IV therapy. RN CM is aware. Please re-consult CSW if needed. Shela Leff MSW,LCSW [phone]

## 2015-07-20 NOTE — Care Management Obs Status (Signed)
[place] NOTIFICATION   Patient Details  Name: CIMONE KOLANO MRN: [id number] Date of Birth: Jun 14, 1928   Medicare Observation Status Notification Given:  Yes    Beverly Sessions, RN 07/20/2015, 3:18 PM

## 2015-07-20 NOTE — Care Management (Signed)
Discussed case with MD.  Plan for patient to discharge on 10 day course of IV antibiotics.  Potential for patient to receive 0600 dose and discharge home tomorrow and receive 1400 dose at home with Home health.  I have updated Corene Cornea with [facility].

## 2015-07-20 NOTE — Care Management Note (Signed)
Case Management Note  Patient Details  Name: REYN RUHNKE MRN: [id number] Date of Birth: [birth year]  Subjective/Objective:                 Admitted with UTI.  Receiving IV antibiotics.  Patient's husband and daughter at bedside.  Lives at home with her husband.  Daughter lives next door.  Patient state that at baseline she is independent. RW, and cane in the home.  She obtains her medications from [facility]. Patient currently receiving IV antibiotics via portacath.     Action/Plan: Anticipated that patient will require IV antibiotic therapy at home.  I have provided agency preference list and [facility] was selected.  I have given Corene Cornea with [facility] a heads up referral.    I have left a message for Dr. Marcelline Mates to get an update on discharge disposition.  Awaiting return call.  At discharge will need: -Order for home health RN and face to face - Order for Medication, Start date/ Stop Date, dose, line care, lab draws if indicated.  - clarification if medication will be administered via Portacath, or if PICC line will need to be placed.   Expected Discharge Date:                  Expected Discharge Plan:     In-House Referral:     Discharge planning Services     Post Acute Care Choice:    Choice offered to:     DME Arranged:    DME Agency:     HH Arranged:    [place] Agency:     Status of Service:     Medicare Important Message Given:    Date Medicare IM Given:    Medicare IM give by:    Date Additional Medicare IM Given:    Additional Medicare Important Message give by:     If discussed at [place] of Stay Meetings, dates discussed:    Additional Comments:  Beverly Sessions, RN 07/20/2015, 3:11 PM

## 2015-07-21 DIAGNOSIS — N390 Urinary tract infection, site not specified: Secondary | ICD-10-CM | POA: Diagnosis not present

## 2015-07-21 MED ORDER — SODIUM CHLORIDE 0.9 % IV SOLN: 500.0000 mg | Freq: Three times a day (TID) | INTRAVENOUS | Status: DC

## 2015-07-21 NOTE — Care Management (Signed)
Patient to discharge today.  All arrangements have been made with [facility] to administer course of IV antibiotics starting at 1400 today.  See previous RNCM note.  RNCM signing off

## 2015-07-21 NOTE — Discharge Summary (Signed)
Physician Discharge Summary  Patient ID: Nicole Clayton MRN: [id number] DOB/AGE: 1928/03/29 82 y.o.  Admit date: 07/19/2015 Discharge date: 07/21/2015  Admission Diagnoses: UTI (urinary tract infection) due to Enterococcus, Multi-drug resistant, h/o ovarian cancer currently with recurrence, receiving chemotherapy  Discharge Diagnoses:  Active Problems:   UTI (urinary tract infection) due to Enterococcus   Discharged Condition: good  Hospital Course: The patient was admitted to the Hospital and initiated on IV antibiotics (Imipenem-Cilistatin) 500 mg TID.  She was discharged home on HD#3.  She was arranged to have home health care follow up to complete 10 day course of antibiotic regimen.   Consults: Social Work, phone consult to Hospitalist and ID  Significant Diagnostic Studies: labs: CBC and microbiology: urine culture: positive for E Coli, multi-drug resistant   Results for orders placed or performed during the hospital encounter of 0000000  Basic metabolic panel  Result Value Ref Range   Sodium 140 135 - 145 mmol/L   Potassium 3.8 3.5 - 5.1 mmol/L   Chloride 106 101 - 111 mmol/L   CO2 26 22 - 32 mmol/L   Glucose, Bld 95 65 - 99 mg/dL   BUN 23 (H) 6 - 20 mg/dL   Creatinine, Ser 0.97 0.44 - 1.00 mg/dL   Calcium 8.8 (L) 8.9 - 10.3 mg/dL   GFR calc non Af Amer 52 (L) >60 mL/min   GFR calc Af Amer >60 >60 mL/min   Anion gap 8 5 - 15  CBC WITH DIFFERENTIAL  Result Value Ref Range   WBC 4.8 3.6 - 11.0 K/uL   RBC 3.96 3.80 - 5.20 MIL/uL   Hemoglobin 11.2 (L) 12.0 - 16.0 g/dL   HCT 34.6 (L) 35.0 - 47.0 %   MCV 87.3 80.0 - 100.0 fL   MCH 28.3 26.0 - 34.0 pg   MCHC 32.4 32.0 - 36.0 g/dL   RDW 18.2 (H) 11.5 - 14.5 %   Platelets 380 150 - 440 K/uL   Neutrophils Relative % 46 %   Neutro Abs 2.3 1.4 - 6.5 K/uL   Lymphocytes Relative 34 %   Lymphs Abs 1.7 1.0 - 3.6 K/uL   Monocytes Relative 16 %   Monocytes Absolute 0.8 0.2 - 0.9 K/uL   Eosinophils Relative 2 %   Eosinophils  Absolute 0.1 0 - 0.7 K/uL   Basophils Relative 2 %   Basophils Absolute 0.1 0 - 0.1 K/uL   Urine Culture 07/14/2015:   Urine Culture, Routine Final report (A)   Urine Culture result 1 Escherichia coli (A)   Comments: Greater than 100,000 colony forming units per mL   ANTIMICROBIAL SUSCEPTIBILITY Comment   Comments:   ** S = Susceptible; I = Intermediate; R = Resistant **           P = Positive; N = Negative        MICS are expressed in micrograms per mL   Antibiotic         RSLT#1  RSLT#2  RSLT#3  RSLT#4  Amoxicillin/Clavulanic Acid  R  Ampicillin           R  Cefazolin           R  Cefepime           R  Ceftriaxone         R  Cefuroxime         R  Cephalothin         R  Ciprofloxacin  R  Ertapenem         S  Gentamicin         R  Imipenem          S  Levofloxacin         R  Nitrofurantoin       I  Piperacillin         R  Tetracycline         I  Tobramycin         R  Trimethoprim/Sulfa   S                    Treatments: IV hydration and antibiotics: Imipenim  Discharge Exam: Blood pressure 149/67, pulse 77, temperature 98.2 F (36.8 C), temperature source Oral, resp. rate 20, height 5' (1.524 m), weight 137 lb 12.8 oz (62.506 kg), SpO2 95 %.  General: alert and no distress Resp: clear to auscultation bilaterally Cardio: regular rate and rhythm, S1, S2 normal, no murmur, click, rub or gallop GI: soft, non-tender; bowel sounds normal; no masses, no organomegaly Extremities: extremities normal, atraumatic, no cyanosis or edema Vaginal Bleeding: none  Disposition: 01-Home or Self Care     Medication List    STOP taking these medications        ciprofloxacin 500 MG tablet  Commonly known as:  CIPRO     levofloxacin 250 MG tablet   Commonly known as:  LEVAQUIN      TAKE these medications        acetaminophen 500 MG tablet  Commonly known as:  TYLENOL  Take 1 tablet (500 mg total) by mouth every 6 (six) hours as needed.     aspirin EC 81 MG tablet  Take 81 mg by mouth.     cetirizine 10 MG tablet  Commonly known as:  ZYRTEC  Take by mouth.     Cranberry 1000 MG Caps  Take by mouth.     docusate sodium 100 MG capsule  Commonly known as:  COLACE  Take 100 mg by mouth.     fluticasone 50 MCG/ACT nasal spray  Commonly known as:  FLONASE  Place into the nose.     gabapentin 600 MG tablet  Commonly known as:  NEURONTIN  Take 1 tablet (600 mg total) by mouth 3 (three) times daily.     HYDROcodone-acetaminophen 5-325 MG tablet  Commonly known as:  NORCO/VICODIN  Take 1 tablet by mouth every 6 (six) hours as needed for moderate pain.     imipenem-cilastatin 500 mg in sodium chloride 0.9 % 100 mL  Inject 500 mg into the vein every 8 (eight) hours. Use port cath for injections. Start 07/21/15 at 2:00 pm, end 07/29/15 at 10 pm     loratadine 10 MG tablet  Commonly known as:  CLARITIN  Take by mouth.     metoprolol succinate 50 MG 24 hr tablet  Commonly known as:  TOPROL-XL  Take 50 mg by mouth.     nystatin ointment  Commonly known as:  MYCOSTATIN  Apply 1 application topically 2 (two) times daily.     ondansetron 4 MG tablet  Commonly known as:  ZOFRAN  TAKE (1) TABLET BY MOUTH EVERY 8 HOURS AS NEEDED FOR NAUSEA/VOMITING     pantoprazole 40 MG tablet  Commonly known as:  PROTONIX  Take 40 mg by mouth.     triamcinolone ointment 0.1 %  Commonly known as:  KENALOG  Apply 1 application topically 2 (two)  times daily.     vitamin C 100 MG tablet  Take 100 mg by mouth daily.     Vitamin D3 2000 units capsule  Take by mouth.           Follow-up Information    Follow up with Brayton Mars, MD In 1 week.   Specialties:  Obstetrics and Gynecology, Radiology   Why:  Patient already has  a follow up appointment   Contact information:   [address] [place] [address] [phone]       Signed: Rubie Maid 07/21/2015, 10:21 AM

## 2015-07-21 NOTE — Progress Notes (Signed)
GYNECOLOGY CLINIC PROGRESS NOTE  Subjective: Patient denies complaints.     Objective: I have reviewed patient's vital signs, intake and output and medications.  Temp:  [97.9 F (36.6 C)-98.2 F (36.8 C)] 98.2 F (36.8 C) (06/16 0600) Pulse Rate:  [72-77] 77 (06/16 0600) Resp:  [20] 20 (06/16 0600) BP: (125-149)/(67-72) 149/67 mmHg (06/16 0600) SpO2:  [95 %-97 %] 95 % (06/16 0600)  General: alert and no distress Resp: clear to auscultation bilaterally Cardio: regular rate and rhythm, S1, S2 normal, no murmur, click, rub or gallop GI: soft, non-tender; bowel sounds normal; no masses,  no organomegaly Extremities: extremities normal, atraumatic, no cyanosis or edema Vaginal Bleeding: none    Scheduled Meds: . imipenem-cilastatin  500 mg Intravenous Q8H   Continuous Infusions: . lactated ringers 125 mL/hr at 07/20/15 1449   PRN Meds:.docusate sodium, HYDROmorphone (DILAUDID) injection, ibuprofen, magnesium hydroxide, phenazopyridine, promethazine  Labs:  Lab Results  Component Value Date   WBC 4.8 07/19/2015   HGB 11.2* 07/19/2015   HCT 34.6* 07/19/2015   MCV 87.3 07/19/2015   PLT 380 07/19/2015    Lab Results  Component Value Date   CREATININE 0.97 07/19/2015   BUN 23* 07/19/2015   NA 140 07/19/2015   K 3.8 07/19/2015   CL 106 07/19/2015   CO2 26 07/19/2015   Urine Culture 07/14/2015:   Urine Culture, Routine Final report (A)   Urine Culture result 1 Escherichia coli (A)   Comments: Greater than 100,000 colony forming units per mL   ANTIMICROBIAL SUSCEPTIBILITY Comment   Comments:   ** S = Susceptible; I = Intermediate; R = Resistant **           P = Positive; N = Negative        MICS are expressed in micrograms per mL   Antibiotic         RSLT#1  RSLT#2  RSLT#3  RSLT#4  Amoxicillin/Clavulanic Acid  R  Ampicillin           R  Cefazolin           R  Cefepime           R   Ceftriaxone         R  Cefuroxime         R  Cephalothin         R  Ciprofloxacin        R  Ertapenem         S  Gentamicin         R  Imipenem          S  Levofloxacin         R  Nitrofurantoin       I  Piperacillin         R  Tetracycline         I  Tobramycin         R  Trimethoprim/Sulfa   S              Assessment/Plan: 82 y.o. G48P3003 y.o. female with multi-drug resistant UTI (E. Coli), h/o ovarian cancer currently with port placement. HdD#3.  1. Continue IV antibiotic therapy. Currently on Imipenem 500 mg q 8 hrs.  Discussed with hospitalist and ID, patient will need to continue treatment regimen for a total of 10 days.  2. Social Work consulted, arranging for patient to receive remaining treatments at home with home health nurse.  3.  Planning for discharge today.  LOS: 2 days    Rubie Maid 07/21/2015, 10:16 AM

## 2015-07-21 NOTE — Discharge Instructions (Signed)

## 2015-07-21 NOTE — Progress Notes (Signed)
07/21/2015 12:22 PM  Earney Navy to be D/C'd Home per MD order.  Discussed prescriptions and follow up appointments with the patient. Prescriptions given to patient, medication list explained in detail. Pt verbalized understanding.    Medication List    STOP taking these medications        ciprofloxacin 500 MG tablet  Commonly known as:  CIPRO     levofloxacin 250 MG tablet  Commonly known as:  LEVAQUIN      TAKE these medications        acetaminophen 500 MG tablet  Commonly known as:  TYLENOL  Take 1 tablet (500 mg total) by mouth every 6 (six) hours as needed.     aspirin EC 81 MG tablet  Take 81 mg by mouth.     cetirizine 10 MG tablet  Commonly known as:  ZYRTEC  Take by mouth.     Cranberry 1000 MG Caps  Take by mouth.     docusate sodium 100 MG capsule  Commonly known as:  COLACE  Take 100 mg by mouth.     fluticasone 50 MCG/ACT nasal spray  Commonly known as:  FLONASE  Place into the nose.     gabapentin 600 MG tablet  Commonly known as:  NEURONTIN  Take 1 tablet (600 mg total) by mouth 3 (three) times daily.     HYDROcodone-acetaminophen 5-325 MG tablet  Commonly known as:  NORCO/VICODIN  Take 1 tablet by mouth every 6 (six) hours as needed for moderate pain.     imipenem-cilastatin 500 mg in sodium chloride 0.9 % 100 mL  Inject 500 mg into the vein every 8 (eight) hours. Use port cath for injections. Start 07/21/15 at 2:00 pm, end 07/29/15 at 10 pm     loratadine 10 MG tablet  Commonly known as:  CLARITIN  Take by mouth.     metoprolol succinate 50 MG 24 hr tablet  Commonly known as:  TOPROL-XL  Take 50 mg by mouth.     nystatin ointment  Commonly known as:  MYCOSTATIN  Apply 1 application topically 2 (two) times daily.     ondansetron 4 MG tablet  Commonly known as:  ZOFRAN  TAKE (1) TABLET BY MOUTH EVERY 8 HOURS AS NEEDED FOR NAUSEA/VOMITING     pantoprazole 40 MG tablet  Commonly known as:  PROTONIX  Take 40 mg by mouth.     triamcinolone ointment 0.1 %  Commonly known as:  KENALOG  Apply 1 application topically 2 (two) times daily.     vitamin C 100 MG tablet  Take 100 mg by mouth daily.     Vitamin D3 2000 units capsule  Take by mouth.        Filed Vitals:   07/20/15 1250 07/21/15 0600  BP: 125/72 149/67  Pulse: 72 77  Temp: 97.9 F (36.6 C) 98.2 F (36.8 C)  Resp: 20 20    Skin clean, dry and intact without evidence of skin break down, no evidence of skin tears noted. IV catheter discontinued intact. Site without signs and symptoms of complications. Dressing and pressure applied. Pt denies pain at this time. No complaints noted.  An After Visit Summary was printed and given to the patient. Patient escorted via [place], and D/C home via private auto.  Dola Argyle

## 2015-07-24 ENCOUNTER — Telehealth: Payer: Self-pay | Admitting: *Deleted

## 2015-07-24 ENCOUNTER — Other Ambulatory Visit: Payer: Self-pay | Admitting: Internal Medicine

## 2015-07-24 NOTE — Telephone Encounter (Signed)
Spoke with Nicole Clayton's daughter- she is receivieng iv atb per [facility]. Per daughter Nicole Clayton will go home over the weekend. Advised to contact office if we can do anything for her.

## 2015-07-24 NOTE — Telephone Encounter (Signed)
Called to report that she was hospitalized with severe UTI and is home on IV abx. Her PCP suggests she not get chemo this week and she is asking when the appt is to be rescheduled, Possibly next week? Please advise

## 2015-07-24 NOTE — Telephone Encounter (Signed)
Per Dr Rogue Bussing, cancel chemo, but keep lab md appt. Nicole Clayton informed and agrees to this

## 2015-07-25 ENCOUNTER — Other Ambulatory Visit: Payer: Self-pay | Admitting: *Deleted

## 2015-07-25 DIAGNOSIS — C5700 Malignant neoplasm of unspecified fallopian tube: Secondary | ICD-10-CM

## 2015-07-27 ENCOUNTER — Ambulatory Visit: Payer: Medicare HMO

## 2015-07-27 ENCOUNTER — Inpatient Hospital Stay: Payer: Medicare HMO

## 2015-07-27 ENCOUNTER — Inpatient Hospital Stay (HOSPITAL_BASED_OUTPATIENT_CLINIC_OR_DEPARTMENT_OTHER): Payer: Medicare HMO | Admitting: Internal Medicine

## 2015-07-27 VITALS — BP 143/83 | HR 61 | Temp 95.9°F | Resp 18 | Wt 138.7 lb

## 2015-07-27 DIAGNOSIS — C5701 Malignant neoplasm of right fallopian tube: Secondary | ICD-10-CM | POA: Diagnosis not present

## 2015-07-27 DIAGNOSIS — C5700 Malignant neoplasm of unspecified fallopian tube: Secondary | ICD-10-CM

## 2015-07-27 DIAGNOSIS — Z86718 Personal history of other venous thrombosis and embolism: Secondary | ICD-10-CM

## 2015-07-27 DIAGNOSIS — Z8543 Personal history of malignant neoplasm of ovary: Secondary | ICD-10-CM | POA: Diagnosis not present

## 2015-07-27 DIAGNOSIS — N390 Urinary tract infection, site not specified: Secondary | ICD-10-CM | POA: Diagnosis not present

## 2015-07-27 DIAGNOSIS — Z87440 Personal history of urinary (tract) infections: Secondary | ICD-10-CM

## 2015-07-27 DIAGNOSIS — Z79899 Other long term (current) drug therapy: Secondary | ICD-10-CM

## 2015-07-27 DIAGNOSIS — C774 Secondary and unspecified malignant neoplasm of inguinal and lower limb lymph nodes: Secondary | ICD-10-CM | POA: Diagnosis not present

## 2015-07-27 DIAGNOSIS — Z5112 Encounter for antineoplastic immunotherapy: Secondary | ICD-10-CM | POA: Diagnosis not present

## 2015-07-27 DIAGNOSIS — B9620 Unspecified Escherichia coli [E. coli] as the cause of diseases classified elsewhere: Secondary | ICD-10-CM

## 2015-07-27 DIAGNOSIS — M7989 Other specified soft tissue disorders: Secondary | ICD-10-CM

## 2015-07-27 DIAGNOSIS — R5383 Other fatigue: Secondary | ICD-10-CM

## 2015-07-27 LAB — COMPREHENSIVE METABOLIC PANEL
ALBUMIN: 3.4 g/dL — AB (ref 3.5–5.0)
ALK PHOS: 56 U/L (ref 38–126)
ALT: 14 U/L (ref 14–54)
AST: 25 U/L (ref 15–41)
Anion gap: 5 (ref 5–15)
BILIRUBIN TOTAL: 0.3 mg/dL (ref 0.3–1.2)
BUN: 15 mg/dL (ref 6–20)
CALCIUM: 8.5 mg/dL — AB (ref 8.9–10.3)
CO2: 27 mmol/L (ref 22–32)
CREATININE: 0.8 mg/dL (ref 0.44–1.00)
Chloride: 104 mmol/L (ref 101–111)
GFR calc Af Amer: >60 mL/min (ref >60)
GFR calc non Af Amer: >60 mL/min (ref >60)
GLUCOSE: 110 mg/dL — AB (ref 65–99)
Potassium: 3.7 mmol/L (ref 3.5–5.1)
Sodium: 136 mmol/L (ref 135–145)
TOTAL PROTEIN: 6.9 g/dL (ref 6.5–8.1)

## 2015-07-27 LAB — CBC WITH DIFFERENTIAL/PLATELET
BASOS ABS: 0.1 10*3/uL (ref 0–0.1)
BASOS PCT: 1 %
Eosinophils Absolute: 0.3 10*3/uL (ref 0–0.7)
Eosinophils Relative: 6 %
HEMATOCRIT: 33.4 % — AB (ref 35.0–47.0)
HEMOGLOBIN: 11.1 g/dL — AB (ref 12.0–16.0)
Lymphocytes Relative: 43 %
Lymphs Abs: 1.9 10*3/uL (ref 1.0–3.6)
MCH: 28.9 pg (ref 26.0–34.0)
MCHC: 33.3 g/dL (ref 32.0–36.0)
MCV: 87 fL (ref 80.0–100.0)
MONOS PCT: 14 %
Monocytes Absolute: 0.6 10*3/uL (ref 0.2–0.9)
NEUTROS ABS: 1.6 10*3/uL (ref 1.4–6.5)
NEUTROS PCT: 36 %
Platelets: 362 10*3/uL (ref 150–440)
RBC: 3.84 MIL/uL (ref 3.80–5.20)
RDW: 17.9 % — ABNORMAL HIGH (ref 11.5–14.5)
WBC: 4.5 10*3/uL (ref 3.6–11.0)

## 2015-07-27 NOTE — Assessment & Plan Note (Addendum)
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   I would recommend holding the treatment today [given UTI; on IV Abx] reevaluated with a CT of the abdomen and pelvis with contrast. CA 125 not helpful.  # Based upon the results of the CT scan I would recommend further treatment options including- Rupacarib vs Zejula.   # History of DVT currently not on anticoagulation.   # The above plan was discussed with the patient and her daughter in detail. They agree. Scan in approximately 2 weeks follow-up with me in 3 weeks/possible Avastin labs UA.

## 2015-07-27 NOTE — Progress Notes (Signed)
[facility] OFFICE PROGRESS NOTE  Patient Care Team: Ezequiel Kayser, [name]  No matching staging information was found for the patient.   Oncology History   . 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  Adequate TRS, no macroscopic residual. IP/IV chemotherapy with DDP and paclitaxel on GOG protocol, chemo and Bev consolidation completed in 03/2011 2. 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 November 14, 2011- Patient was started on carboplatin and gemcitabine 4. Finished 6 cycles of chemotherapy in April of 2014 with carboplatin and gemcitabine. Tolerance was fairly good except for neutropenia and thrombocytopenia. 5.recurrent disease by CT scan February of 2015 6.radiation therapy to pelvis  May of 2015 7.upper extremity  . and deep vein thrombosis associated with port.(June of 2015) patient started on   [place] had port removed and had   thrombectomy September 06, 2013. # CT DEC 2016- progressing disease in the right inguinal area # FEB-TAXOL-AVASTIN;  # FEB 2017- AVASTIN q 3W       Malignant neoplasm of right fallopian tube ([facility])   10/05/2009 Initial Diagnosis Carcinoma of fallopian tube     INTERVAL HISTORY:  Nicole Clayton 82 y.o.  female pleasant patient above history of Fallopian tube cancer metastatic currently on maintenance Avastin is here for follow-up.  Patient had multiple urinary tract infection and more recently E coli-multiple resistant- currently on imipenem IV.  Patient still fatigued. Otherwise no fever. Chills. No nausea or vomiting. Appetite is fair. She is chronic swelling of right lower extremity- to be due to her lymph node masses in the right groin.   REVIEW OF SYSTEMS:  A complete 10 point review of system is done which is negative except mentioned above/history of present illness.   PAST MEDICAL HISTORY :  Past Medical  History  Diagnosis Date  . MI (mitral incompetence) 10/27/2014    Overview:  MODERATE   . Bladder infection, chronic 10/19/2011  . Cancer (HCC)     Ovarian   . Carcinoma of fallopian tube ([place]) 10/05/2009    Overview:  Overview:  Overview:  Drs. Claiborne Rigg and Delorise Shiner Choksi Overview:  Drs. Claiborne Rigg and [employer]   . GERD (gastroesophageal reflux disease)   . Tachycardia   . OAB (overactive bladder)   . Mitral valve prolapse     PAST SURGICAL HISTORY :   Past Surgical History  Procedure Laterality Date  . Laparoscopic bilateral salpingo oopherectomy  2011  . Abdominal hysterectomy    . Cholecystectomy      FAMILY HISTORY :   Family History  Problem Relation Age of Onset  . Breast cancer Neg Hx   . Colon cancer Neg Hx   . Diabetes Neg Hx   . Heart disease Neg Hx   . Ovarian cancer Other     SOCIAL HISTORY:   Social History  Substance Use Topics  . Smoking status: Never Smoker   . Smokeless tobacco: Not on file  . Alcohol Use: No    ALLERGIES:  is allergic to benadryl; alendronate sodium; duloxetine hcl; risedronate sodium; nitrofurantoin; lovastatin; and sulfa antibiotics.  MEDICATIONS:  Current Outpatient Prescriptions  Medication Sig Dispense Refill  . acetaminophen (TYLENOL) 500 MG tablet Take 1 tablet (500 mg total) by mouth every 6 (six) hours as needed. 30 tablet 0  . Ascorbic Acid (VITAMIN C) 100 MG tablet Take 100 mg by mouth daily.    Marland Kitchen  aspirin EC 81 MG tablet Take 81 mg by mouth.    . cetirizine (ZYRTEC) 10 MG tablet Take by mouth.    . Cholecalciferol (VITAMIN D3) 2000 UNITS capsule Take by mouth.    . Cranberry 1000 MG CAPS Take by mouth.    . docusate sodium (COLACE) 100 MG capsule Take 100 mg by mouth.    . fluticasone (FLONASE) 50 MCG/ACT nasal spray Place into the nose.    . gabapentin (NEURONTIN) 600 MG tablet Take 1 tablet (600 mg total) by mouth 3 (three) times daily. 180 tablet 3  . HYDROcodone-acetaminophen (NORCO/VICODIN) 5-325 MG  tablet Take 1 tablet by mouth every 6 (six) hours as needed for moderate pain. 30 tablet 0  . imipenem-cilastatin 500 mg in sodium chloride 0.9 % 100 mL Inject 500 mg into the vein every 8 (eight) hours. Use port cath for injections. Start 07/21/15 at 2:00 pm, end 07/29/15 at 10 pm 500 mg of imipenem 26  . loratadine (CLARITIN) 10 MG tablet Take by mouth.    . metoprolol succinate (TOPROL-XL) 50 MG 24 hr tablet     . nystatin ointment (MYCOSTATIN) Apply 1 application topically 2 (two) times daily. 30 g 0  . ondansetron (ZOFRAN) 4 MG tablet TAKE (1) TABLET BY MOUTH EVERY 8 HOURS AS NEEDED FOR NAUSEA/VOMITING 30 tablet 3  . pantoprazole (PROTONIX) 40 MG tablet Take 40 mg by mouth.    . triamcinolone ointment (KENALOG) 0.1 % Apply 1 application topically 2 (two) times daily. 30 g 0  . metoprolol succinate (TOPROL-XL) 50 MG 24 hr tablet Take 50 mg by mouth.     No current facility-administered medications for this visit.   Facility-Administered Medications Ordered in Other Visits  Medication Dose Route Frequency Provider Last Rate Last Dose  . diphenhydrAMINE (BENADRYL) injection 50 mg  50 mg Intravenous Once Forest Gleason, MD   50 mg at 02/16/15 1157  . sodium chloride 0.9 % injection 10 mL  10 mL Intravenous PRN Forest Gleason, MD   10 mL at 01/12/15 1116  . sodium chloride 0.9 % injection 10 mL  10 mL Intravenous PRN Forest Gleason, MD   10 mL at 02/16/15 1025    PHYSICAL EXAMINATION: ECOG PERFORMANCE STATUS: 2 - Symptomatic, <50% confined to bed  BP 143/83 mmHg  Pulse 61  Temp(Src) 95.9 F (35.5 C) (Tympanic)  Resp 18  Wt 138 lb 10.7 oz (62.9 kg)  Filed Weights   07/27/15 1438  Weight: 138 lb 10.7 oz (62.9 kg)    GENERAL: Well-nourished well-developed; Alert, no distress and comfortable.   In a wheelchair. Accompanied by daughter. EYES: no pallor or icterus OROPHARYNX: no thrush or ulceration; good dentition  NECK: supple, no masses felt LYMPH:  no palpable lymphadenopathy in the  cervical, axillary or inguinal regions LUNGS: clear to auscultation and  No wheeze or crackles HEART/CVS: regular rate & rhythm and no murmurs; chronic right lower extremity swelling ABDOMEN:abdomen soft, non-tender and normal bowel sounds Musculoskeletal:no cyanosis of digits and no clubbing  PSYCH: alert & oriented x 3 with fluent speech NEURO: no focal motor/sensory deficits SKIN:  no rashes or significant lesions  LABORATORY DATA:  I have reviewed the data as listed    Component Value Date/Time   NA 136 07/27/2015 1340   NA 138 06/02/2014 0910   K 3.7 07/27/2015 1340   K 3.7 06/02/2014 0910   CL 104 07/27/2015 1340   CL 107 06/02/2014 0910   CO2 27 07/27/2015 1340  CO2 26 06/02/2014 0910   GLUCOSE 110* 07/27/2015 1340   GLUCOSE 106* 06/02/2014 0910   BUN 15 07/27/2015 1340   BUN 17 06/02/2014 0910   CREATININE 0.80 07/27/2015 1340   CREATININE 1.00 06/02/2014 0910   CALCIUM 8.5* 07/27/2015 1340   CALCIUM 8.6* 06/02/2014 0910   PROT 6.9 07/27/2015 1340   PROT 6.3* 06/02/2014 0910   ALBUMIN 3.4* 07/27/2015 1340   ALBUMIN 3.4* 06/02/2014 0910   AST 25 07/27/2015 1340   AST 23 06/02/2014 0910   ALT 14 07/27/2015 1340   ALT 10* 06/02/2014 0910   ALKPHOS 56 07/27/2015 1340   ALKPHOS 46 06/02/2014 0910   BILITOT 0.3 07/27/2015 1340   BILITOT 0.5 06/02/2014 0910   GFRNONAA >60 07/27/2015 1340   GFRNONAA 52* 06/02/2014 0910   GFRNONAA >60 03/10/2014 1326   GFRAA >60 07/27/2015 1340   GFRAA >60 06/02/2014 0910   GFRAA >60 03/10/2014 1326    No results found for: SPEP, UPEP  Lab Results  Component Value Date   WBC 4.5 07/27/2015   NEUTROABS 1.6 07/27/2015   HGB 11.1* 07/27/2015   HCT 33.4* 07/27/2015   MCV 87.0 07/27/2015   PLT 362 07/27/2015      Chemistry      Component Value Date/Time   NA 136 07/27/2015 1340   NA 138 06/02/2014 0910   K 3.7 07/27/2015 1340   K 3.7 06/02/2014 0910   CL 104 07/27/2015 1340   CL 107 06/02/2014 0910   CO2 27 07/27/2015  1340   CO2 26 06/02/2014 0910   BUN 15 07/27/2015 1340   BUN 17 06/02/2014 0910   CREATININE 0.80 07/27/2015 1340   CREATININE 1.00 06/02/2014 0910      Component Value Date/Time   CALCIUM 8.5* 07/27/2015 1340   CALCIUM 8.6* 06/02/2014 0910   ALKPHOS 56 07/27/2015 1340   ALKPHOS 46 06/02/2014 0910   AST 25 07/27/2015 1340   AST 23 06/02/2014 0910   ALT 14 07/27/2015 1340   ALT 10* 06/02/2014 0910   BILITOT 0.3 07/27/2015 1340   BILITOT 0.5 06/02/2014 0910       RADIOGRAPHIC STUDIES: I have personally reviewed the radiological images as listed and agreed with the findings in the report. No results found.   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   I would recommend holding the treatment today [given UTI; on IV Abx] reevaluated with a CT of the abdomen and pelvis with contrast. CA 125 not helpful.  # Based upon the results of the CT scan I would recommend further treatment options including- Rupacarib vs Zejula.   # History of DVT currently not on anticoagulation.   # The above plan was discussed with the patient and her daughter in detail. They agree. Scan in approximately 2 weeks follow-up with me in 3 weeks/possible Avastin labs UA.     Orders Placed This Encounter  Procedures  . CT ABDOMEN PELVIS W CONTRAST    Standing Status: Future     Number of Occurrences:      Standing Expiration Date: 10/25/2016    Order Specific Question:  Reason for Exam (SYMPTOM  OR DIAGNOSIS REQUIRED)    Answer:  fallopian tube cancer- metastatic    Order Specific Question:  Preferred imaging location?    Answer:  [facility]  . CBC with Differential    Standing Status: Future     Number of  Occurrences:      Standing Expiration Date: 07/26/2016  . Comprehensive metabolic panel    Standing Status: Future     Number of Occurrences:      Standing Expiration Date: 07/26/2016     Order Specific Question:  Has the patient fasted?    Answer:  No  . Urinalysis complete, with microscopic [facility])    Standing Status: Future     Number of Occurrences:      Standing Expiration Date: 07/26/2016  . CA 125    Standing Status: Future     Number of Occurrences:      Standing Expiration Date: 07/26/2016   All questions were answered. The patient knows to call the clinic with any problems, questions or concerns.      [name], MD 07/27/2015 4:19 PM

## 2015-07-27 NOTE — Progress Notes (Signed)
Patient states she currently has a UTI and is on IV antibiotics.  Also c/o weakness and fatigue.  Does not sleep well due to UTI and being up and down at night.

## 2015-07-28 LAB — CA 125: CA 125: 27.4 U/mL (ref 0.0–38.1)

## 2015-08-01 ENCOUNTER — Ambulatory Visit (INDEPENDENT_AMBULATORY_CARE_PROVIDER_SITE_OTHER): Payer: Medicare HMO | Admitting: Obstetrics and Gynecology

## 2015-08-01 ENCOUNTER — Encounter: Payer: Self-pay | Admitting: Obstetrics and Gynecology

## 2015-08-01 VITALS — BP 138/77 | HR 71 | Ht 60.0 in | Wt 139.8 lb

## 2015-08-01 DIAGNOSIS — Z4689 Encounter for fitting and adjustment of other specified devices: Secondary | ICD-10-CM

## 2015-08-01 DIAGNOSIS — IMO0002 Reserved for concepts with insufficient information to code with codable children: Secondary | ICD-10-CM

## 2015-08-01 DIAGNOSIS — N390 Urinary tract infection, site not specified: Secondary | ICD-10-CM

## 2015-08-01 DIAGNOSIS — N8110 Cystocele, unspecified: Secondary | ICD-10-CM

## 2015-08-01 DIAGNOSIS — IMO0001 Reserved for inherently not codable concepts without codable children: Secondary | ICD-10-CM

## 2015-08-01 DIAGNOSIS — C5700 Malignant neoplasm of unspecified fallopian tube: Secondary | ICD-10-CM

## 2015-08-01 DIAGNOSIS — Z90721 Acquired absence of ovaries, unilateral: Secondary | ICD-10-CM

## 2015-08-01 DIAGNOSIS — Z90710 Acquired absence of both cervix and uterus: Secondary | ICD-10-CM

## 2015-08-01 MED ORDER — FLUCONAZOLE 150 MG PO TABS: 150.0000 mg | ORAL_TABLET | Freq: Once | ORAL | Status: DC

## 2015-08-01 NOTE — Patient Instructions (Signed)
1. Return in 3 months for pessary maintenance 2. Diflucan 150 mg orally is given for Monilia vaginitis prophylaxis due to recent 2 week course of antibiotics

## 2015-08-01 NOTE — Progress Notes (Signed)
Chief complaint: 1. Pessary maintenance 2. Follow-up after treatment for multidrug resistant UTI  The patient has just completed a 10 day course of IV antibiotics  for a resistant Escherichia coli UTI. She does have a pessary for symptomatic cystocele and is here for 3 month follow-up.  Past medical history, past surgical history, problem list, medications, and allergies are reviewed  OBJECTIVE: BP 138/77 mmHg  Pulse 71  Ht 5' (1.524 m)  Wt 139 lb 12.8 oz (63.413 kg)  BMI 27.30 kg/m2 ABDOMEN: Soft, non distended; Non tender. No Organomegaly. Right inguinal mass (previously noted) palpably diminished in size from 8 x 5 cm at its largest extent to to 1 x 2 cm at last visit to [place] adenopathy today PELVIC: External Genitalia: urethral caruncle; mild epidermal discoloration surrounding anus BUS: Normal Vagina: moderate atrophy, vaginal cuff intact Cervix: surgically absent Uterus: surgically absent Adnexa: no palpable masses RV: Normal; considerable amount of soft stool in rectal vault Bladder: Nontender  ASSESSMENT: 1. Cystocele, grade 2, stable 2. Pessary maintenance, completed, with pessary being removed, cleaned, and reinserted 3. Incomplete bladder emptying, improved 4. Vaginal atrophy, stable 5. Status post 10 days of antibiotic therapy for multidrug resistant UTI 6. History of fallopian tube cancer with recurrence to right inguinal region, with ongoing follow-up at the cancer center  PLAN: 1. Pessary is removed, cleaned, reinserted 2. Diflucan 150 mg orally for one dose to help prevent Monilia infection from multi-day antibiotic therapy for UTI 3. Return in 3 months for follow-up  A total of 15 minutes were spent face-to-face with the patient during this encounter and over half of that time dealt with counseling and coordination of care.  Brayton Mars, MD   Note:  This dictation was prepared with Dragon dictation along with smaller phrase technology. Any transcriptional errors that result from this process are unintentional.

## 2015-08-03 ENCOUNTER — Telehealth: Payer: Self-pay | Admitting: Obstetrics and Gynecology

## 2015-08-03 LAB — POCT URINALYSIS DIPSTICK
Bilirubin, UA: NEGATIVE
Glucose, UA: NEGATIVE
Ketones, UA: NEGATIVE
NITRITE UA: POSITIVE
PH UA: 7
SPEC GRAV UA: 1.01
UROBILINOGEN UA: 0.2

## 2015-08-03 MED ORDER — UROGESIC-BLUE 81.6 MG PO TABS: 81.6000 mg | ORAL_TABLET | Freq: Four times a day (QID) | ORAL | Status: DC

## 2015-08-03 MED ORDER — SULFAMETHOXAZOLE-TRIMETHOPRIM 800-160 MG PO TABS: 1.0000 | ORAL_TABLET | Freq: Two times a day (BID) | ORAL | Status: DC

## 2015-08-03 NOTE — Telephone Encounter (Signed)
U/a pos for UTI. Culture sent. Pt states she is having back pain, burning with urination, lower abd pain, nocturia, and incontinence. NO fever. Per mad she can go to er to get IV atb or she can take bactrim(has sulfa allergy). Pt states she doesn't recall a allergy to sulfa maybe as a child. Pt prefers to try bactrim. Allergic to IV benadryl can take po. Advised rx sent in for bactrim and Urogesic blue samples left up front for p/u. If pt sx gets worse or she gets fever she will go to er. Aware to call back on Monday before noon for urine culture results.

## 2015-08-03 NOTE — Addendum Note (Signed)
Addended by: Elouise Munroe on: 08/03/2015 04:35 PM   Modules accepted: Orders

## 2015-08-03 NOTE — Telephone Encounter (Signed)
Pt left a VM on my phone to give her a call.

## 2015-08-04 ENCOUNTER — Other Ambulatory Visit (INDEPENDENT_AMBULATORY_CARE_PROVIDER_SITE_OTHER): Payer: Medicare HMO

## 2015-08-04 DIAGNOSIS — N390 Urinary tract infection, site not specified: Secondary | ICD-10-CM

## 2015-08-07 LAB — URINE CULTURE

## 2015-08-09 ENCOUNTER — Ambulatory Visit
Admission: RE | Admit: 2015-08-09 | Discharge: 2015-08-09 | Disposition: A | Discharge status: Home / Self Care | Payer: Medicare HMO | Source: Ambulatory Visit | Attending: Internal Medicine | Admitting: Internal Medicine

## 2015-08-09 DIAGNOSIS — K449 Diaphragmatic hernia without obstruction or gangrene: Secondary | ICD-10-CM | POA: Insufficient documentation

## 2015-08-09 DIAGNOSIS — R590 Localized enlarged lymph nodes: Secondary | ICD-10-CM | POA: Insufficient documentation

## 2015-08-09 DIAGNOSIS — C5700 Malignant neoplasm of unspecified fallopian tube: Secondary | ICD-10-CM | POA: Insufficient documentation

## 2015-08-09 DIAGNOSIS — Z9049 Acquired absence of other specified parts of digestive tract: Secondary | ICD-10-CM | POA: Insufficient documentation

## 2015-08-09 MED ORDER — IOPAMIDOL (ISOVUE-300) INJECTION 61%
85.0000 mL | Freq: Once | INTRAVENOUS | Status: AC | PRN
  Administered 2015-08-09: 85 mL via INTRAVENOUS

## 2015-08-10 ENCOUNTER — Telehealth: Payer: Self-pay | Admitting: Obstetrics and Gynecology

## 2015-08-10 NOTE — Telephone Encounter (Signed)
[facility].. Sabeen Poehlman FELL AND IS ON ANTIBITICS ALSO UROGESIC IS MAKING HER DIZZY SO SHE QIUT TAKING IT. SHERRI WOULD LIKE FOR YOU TO CALL HER SHE FEELS Rache NEEDS SOME PHYSICAL THERAPY AND SOME CARE. WILL YOU CALL HER PLEASE.[phone]

## 2015-08-11 NOTE — Telephone Encounter (Signed)
Sherri aware ok to d/c urogesic. Pt did fall Sunday d/t dizzy spell. Home health is coming in 2 x a week. Is still taking bactrim with no s/e per Sherri. Sherri aware we need to recollect urine sample. Sherri will collect urine and send up results. Advised Sherri to contact pcp for PT referral.

## 2015-08-17 ENCOUNTER — Inpatient Hospital Stay (HOSPITAL_BASED_OUTPATIENT_CLINIC_OR_DEPARTMENT_OTHER): Payer: Medicare HMO | Admitting: Internal Medicine

## 2015-08-17 ENCOUNTER — Other Ambulatory Visit: Payer: Self-pay

## 2015-08-17 ENCOUNTER — Inpatient Hospital Stay: Payer: Medicare HMO | Attending: Internal Medicine

## 2015-08-17 ENCOUNTER — Encounter: Payer: Self-pay | Admitting: Obstetrics and Gynecology

## 2015-08-17 ENCOUNTER — Inpatient Hospital Stay: Payer: Medicare HMO

## 2015-08-17 VITALS — BP 149/94 | HR 79 | Temp 96.9°F | Resp 18 | Wt 134.9 lb

## 2015-08-17 VITALS — BP 145/83 | HR 67 | Resp 18

## 2015-08-17 DIAGNOSIS — C774 Secondary and unspecified malignant neoplasm of inguinal and lower limb lymph nodes: Secondary | ICD-10-CM | POA: Insufficient documentation

## 2015-08-17 DIAGNOSIS — C5701 Malignant neoplasm of right fallopian tube: Secondary | ICD-10-CM

## 2015-08-17 DIAGNOSIS — Z87440 Personal history of urinary (tract) infections: Secondary | ICD-10-CM | POA: Diagnosis not present

## 2015-08-17 DIAGNOSIS — I341 Nonrheumatic mitral (valve) prolapse: Secondary | ICD-10-CM | POA: Diagnosis not present

## 2015-08-17 DIAGNOSIS — R5383 Other fatigue: Secondary | ICD-10-CM | POA: Insufficient documentation

## 2015-08-17 DIAGNOSIS — C5700 Malignant neoplasm of unspecified fallopian tube: Secondary | ICD-10-CM

## 2015-08-17 DIAGNOSIS — Z79899 Other long term (current) drug therapy: Secondary | ICD-10-CM

## 2015-08-17 DIAGNOSIS — Z7982 Long term (current) use of aspirin: Secondary | ICD-10-CM | POA: Insufficient documentation

## 2015-08-17 DIAGNOSIS — Z8543 Personal history of malignant neoplasm of ovary: Secondary | ICD-10-CM | POA: Diagnosis not present

## 2015-08-17 DIAGNOSIS — K219 Gastro-esophageal reflux disease without esophagitis: Secondary | ICD-10-CM

## 2015-08-17 DIAGNOSIS — Z90722 Acquired absence of ovaries, bilateral: Secondary | ICD-10-CM

## 2015-08-17 DIAGNOSIS — Z5112 Encounter for antineoplastic immunotherapy: Secondary | ICD-10-CM | POA: Diagnosis not present

## 2015-08-17 DIAGNOSIS — N390 Urinary tract infection, site not specified: Secondary | ICD-10-CM

## 2015-08-17 DIAGNOSIS — Z923 Personal history of irradiation: Secondary | ICD-10-CM | POA: Insufficient documentation

## 2015-08-17 DIAGNOSIS — R490 Dysphonia: Secondary | ICD-10-CM

## 2015-08-17 DIAGNOSIS — Z86718 Personal history of other venous thrombosis and embolism: Secondary | ICD-10-CM | POA: Diagnosis not present

## 2015-08-17 LAB — COMPREHENSIVE METABOLIC PANEL
ALBUMIN: 3.8 g/dL (ref 3.5–5.0)
ALT: 17 U/L (ref 14–54)
ANION GAP: 5 (ref 5–15)
AST: 26 U/L (ref 15–41)
Alkaline Phosphatase: 55 U/L (ref 38–126)
BILIRUBIN TOTAL: 0.6 mg/dL (ref 0.3–1.2)
BUN: 23 mg/dL — ABNORMAL HIGH (ref 6–20)
CHLORIDE: 104 mmol/L (ref 101–111)
CO2: 27 mmol/L (ref 22–32)
Calcium: 8.9 mg/dL (ref 8.9–10.3)
Creatinine, Ser: 0.79 mg/dL (ref 0.44–1.00)
GFR calc Af Amer: >60 mL/min (ref >60)
GLUCOSE: 102 mg/dL — AB (ref 65–99)
POTASSIUM: 4.1 mmol/L (ref 3.5–5.1)
Sodium: 136 mmol/L (ref 135–145)
TOTAL PROTEIN: 7.6 g/dL (ref 6.5–8.1)

## 2015-08-17 LAB — CBC WITH DIFFERENTIAL/PLATELET
BASOS ABS: 0.1 10*3/uL (ref 0–0.1)
BASOS PCT: 1 %
EOS PCT: 1 %
Eosinophils Absolute: 0.1 10*3/uL (ref 0–0.7)
HEMATOCRIT: 36 % (ref 35.0–47.0)
Hemoglobin: 12.1 g/dL (ref 12.0–16.0)
Lymphocytes Relative: 40 %
Lymphs Abs: 2.1 10*3/uL (ref 1.0–3.6)
MCH: 29.3 pg (ref 26.0–34.0)
MCHC: 33.7 g/dL (ref 32.0–36.0)
MCV: 86.9 fL (ref 80.0–100.0)
MONO ABS: 0.8 10*3/uL (ref 0.2–0.9)
MONOS PCT: 15 %
NEUTROS ABS: 2.3 10*3/uL (ref 1.4–6.5)
Neutrophils Relative %: 43 %
PLATELETS: 389 10*3/uL (ref 150–440)
RBC: 4.14 MIL/uL (ref 3.80–5.20)
RDW: 17.8 % — AB (ref 11.5–14.5)
WBC: 5.3 10*3/uL (ref 3.6–11.0)

## 2015-08-17 MED ORDER — SODIUM CHLORIDE 0.9 % IV SOLN: 1.0000 g | Freq: Every day | INTRAVENOUS | Status: DC

## 2015-08-17 MED ORDER — NIRAPARIB TOSYLATE 100 MG PO CAPS: 300.0000 mg | ORAL_CAPSULE | Freq: Once | ORAL | Status: DC

## 2015-08-17 MED ORDER — SODIUM CHLORIDE 0.9% FLUSH
10.0000 mL | INTRAVENOUS | Status: DC | PRN
  Administered 2015-08-17: 10 mL via INTRAVENOUS
  Filled 2015-08-17: qty 10

## 2015-08-17 MED ORDER — HEPARIN SOD (PORK) LOCK FLUSH 100 UNIT/ML IV SOLN
500.0000 [IU] | Freq: Once | INTRAVENOUS | Status: AC
  Administered 2015-08-17: 500 [IU] via INTRAVENOUS

## 2015-08-17 MED ORDER — HEPARIN SOD (PORK) LOCK FLUSH 100 UNIT/ML IV SOLN
INTRAVENOUS | Status: AC
  Filled 2015-08-17: qty 5

## 2015-08-17 MED ORDER — SODIUM CHLORIDE 0.9 % IV SOLN
10.0000 mg/kg | Freq: Once | INTRAVENOUS | Status: AC
  Administered 2015-08-17: 625 mg via INTRAVENOUS
  Filled 2015-08-17: qty 5

## 2015-08-17 MED ORDER — SODIUM CHLORIDE 0.9 % IV SOLN
Freq: Once | INTRAVENOUS | Status: AC
  Administered 2015-08-17: 15:00:00 via INTRAVENOUS
  Filled 2015-08-17: qty 1000

## 2015-08-17 NOTE — Assessment & Plan Note (Addendum)
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 July 2017 CT scan shows- improved inguinal adenopathy. Recommend continued Avastin every 3 weeks; discussed the concerns of potential side effects of Avastin especially at her age.  We discussed the use of Zejula- maintenance therapy. Discussed the potential side effects including GI side effects nausea vomiting diarrhea; and anemia and thrombocytopenia.   # Hoasrseness of voice- unclear etiology; discussed ENT evaluation patient declined  # History of DVT currently not on anticoagulation.   # The above plan was discussed with the patient and her daughter in detail. Follow-up with me in approximately 3 weeks.   I reviewed the images myself and with the patient and family in detail. A copy of this report was given.  # 25 minutes face-to-face with the patient discussing the above plan of care; more than 50% of time spent on prognosis/ natural history; counseling and coordination.

## 2015-08-17 NOTE — Progress Notes (Signed)
[place] OFFICE PROGRESS NOTE  Patient Care Team: Ezequiel Kayser, [name]  No matching staging information was found for the patient.   Oncology History   . 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  Adequate TRS, no macroscopic residual. IP/IV chemotherapy with DDP and paclitaxel on GOG protocol, chemo and Bev consolidation completed in 03/2011 2. 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 November 14, 2011- Patient was started on carboplatin and gemcitabine 4. Finished 6 cycles of chemotherapy in April of 2014 with carboplatin and gemcitabine. Tolerance was fairly good except for neutropenia and thrombocytopenia. 5.recurrent disease by CT scan February of 2015 6.radiation therapy to pelvis  May of 2015 7.upper extremity  . and deep vein thrombosis associated with port.(June of 2015) patient started on   [place] had port removed and had   thrombectomy September 06, 2013. # CT DEC 2016- progressing disease in the right inguinal area # FEB-TAXOL-AVASTIN;  # FEB 2017- AVASTIN q 3W; July 2017- CT- Improved Right Inguinal LN;        Malignant neoplasm of right fallopian tube ([place])   10/05/2009 Initial Diagnosis Carcinoma of fallopian tube     INTERVAL HISTORY:  Nicole Clayton 82 y.o.  female pleasant patient above history of Fallopian tube cancer metastatic currently on maintenance Avastin is here for follow-up. 3 weeks ago Avastin was held because of UTI. She is here to review the results of her restaging CAT scan.  Patient states that she has been started on antibiotic for recurrent UTI. Patient still fatigued. Otherwise no fever. Chills. No nausea or vomiting. Appetite is fair. She is chronic swelling of right lower extremity- to be due to her lymph node masses in the right groin.   REVIEW OF SYSTEMS:  A complete 10 point review of system is done which is  negative except mentioned above/history of present illness. Complains of hoarseness of voice.  PAST MEDICAL HISTORY :  Past Medical History  Diagnosis Date  . MI (mitral incompetence) 10/27/2014    Overview:  MODERATE   . Bladder infection, chronic 10/19/2011  . Cancer (HCC)     Ovarian   . Carcinoma of fallopian tube ([place]) 10/05/2009    Overview:  Overview:  Overview:  Drs. Claiborne Rigg and Delorise Shiner [place]ok[place] Overview:  Drs. Claiborne Rigg and [employer]   . GERD (gastroesophageal reflux disease)   . Tachycardia   . OAB (overactive bladder)   . Mitral valve prolapse     PAST SURGICAL HISTORY :   Past Surgical History  Procedure Laterality Date  . Laparoscopic bilateral salpingo oopherectomy  2011  . Abdominal hysterectomy    . Cholecystectomy      FAMILY HISTORY :   Family History  Problem Relation Age of Onset  . Breast cancer Neg Hx   . Colon cancer Neg Hx   . Diabetes Neg Hx   . Heart disease Neg Hx   . Ovarian cancer Other     SOCIAL HISTORY:   Social History  Substance Use Topics  . Smoking status: Never Smoker   . Smokeless tobacco: Not on file  . Alcohol Use: No    ALLERGIES:  is allergic to benadryl; alendronate sodium; duloxetine hcl; risedronate sodium; nitrofurantoin; lovastatin; and sulfa antibiotics.  MEDICATIONS:  Current Outpatient Prescriptions  Medication Sig Dispense Refill  . acetaminophen (TYLENOL) 500 MG tablet Take 1 tablet (500 mg total) by  mouth every 6 (six) hours as needed. 30 tablet 0  . Ascorbic Acid (VITAMIN C) 100 MG tablet Take 100 mg by mouth daily.    Marland Kitchen aspirin EC 81 MG tablet Take 81 mg by mouth.    . cetirizine (ZYRTEC) 10 MG tablet Take by mouth.    . Cholecalciferol (VITAMIN D3) 2000 UNITS capsule Take by mouth.    . Cranberry 1000 MG CAPS Take by mouth.    . docusate sodium (COLACE) 100 MG capsule Take 100 mg by mouth.    . ertapenem 1 g in sodium chloride 0.9 % 50 mL Inject 1 g into the vein daily. For 7 days 7 g 0  .  fluconazole (DIFLUCAN) 150 MG tablet Take 1 tablet (150 mg total) by mouth once. Can take additional dose three days later if symptoms persist 1 tablet 3  . fluticasone (FLONASE) 50 MCG/ACT nasal spray Place into the nose.    . gabapentin (NEURONTIN) 600 MG tablet Take 1 tablet (600 mg total) by mouth 3 (three) times daily. 180 tablet 3  . imipenem-cilastatin 500 mg in sodium chloride 0.9 % 100 mL Inject 500 mg into the vein every 8 (eight) hours. Use port cath for injections. Start 07/21/15 at 2:00 pm, end 07/29/15 at 10 pm 500 mg of imipenem 26  . loratadine (CLARITIN) 10 MG tablet Take by mouth.    . metoprolol succinate (TOPROL-XL) 50 MG 24 hr tablet     . nystatin ointment (MYCOSTATIN) Apply 1 application topically 2 (two) times daily. 30 g 0  . ondansetron (ZOFRAN) 4 MG tablet TAKE (1) TABLET BY MOUTH EVERY 8 HOURS AS NEEDED FOR NAUSEA/VOMITING 30 tablet 3  . pantoprazole (PROTONIX) 40 MG tablet Take 40 mg by mouth.    . triamcinolone ointment (KENALOG) 0.1 % Apply 1 application topically 2 (two) times daily. 30 g 0  . metoprolol succinate (TOPROL-XL) 50 MG 24 hr tablet Take 50 mg by mouth.    Alanda Slim Tosylate (ZEJULA) 100 MG CAPS Take 300 mg by mouth once. Take 3 pills at bed time; IF NEEDED take a nausea pill 30 mins prior. 90 capsule 3   No current facility-administered medications for this visit.   Facility-Administered Medications Ordered in Other Visits  Medication Dose Route Frequency Provider Last Rate Last Dose  . diphenhydrAMINE (BENADRYL) injection 50 mg  50 mg Intravenous Once Forest Gleason, MD   50 mg at 02/16/15 1157  . sodium chloride 0.9 % injection 10 mL  10 mL Intravenous PRN Forest Gleason, MD   10 mL at 01/12/15 1116  . sodium chloride 0.9 % injection 10 mL  10 mL Intravenous PRN Forest Gleason, MD   10 mL at 02/16/15 1025    PHYSICAL EXAMINATION: ECOG PERFORMANCE STATUS: 2 - Symptomatic, <50% confined to bed  BP 149/94 mmHg  Pulse 79  Temp(Src) 96.9 F (36.1 C)  (Tympanic)  Resp 18  Wt 134 lb 14.7 oz (61.2 kg)  Filed Weights   08/17/15 1409  Weight: 134 lb 14.7 oz (61.2 kg)    GENERAL: Well-nourished well-developed; Alert, no distress and comfortable.  She is walking herself. Accompanied by daughter. EYES: no pallor or icterus OROPHARYNX: no thrush or ulceration; good dentition  NECK: supple, no masses felt LYMPH:  no palpable lymphadenopathy in the cervical, axillary; Right ingiunal LN ~1-2cm in size.  LUNGS: clear to auscultation and  No wheeze or crackles HEART/CVS: regular rate & rhythm and no murmurs; chronic right lower extremity swelling ABDOMEN:abdomen soft,  non-tender and normal bowel sounds Musculoskeletal:no cyanosis of digits and no clubbing  PSYCH: alert & oriented x 3 with fluent speech NEURO: no focal motor/sensory deficits SKIN:  no rashes or significant lesions  LABORATORY DATA:  I have reviewed the data as listed    Component Value Date/Time   NA 136 08/17/2015 1333   NA 138 06/02/2014 0910   K 4.1 08/17/2015 1333   K 3.7 06/02/2014 0910   CL 104 08/17/2015 1333   CL 107 06/02/2014 0910   CO2 27 08/17/2015 1333   CO2 26 06/02/2014 0910   GLUCOSE 102* 08/17/2015 1333   GLUCOSE 106* 06/02/2014 0910   BUN 23* 08/17/2015 1333   BUN 17 06/02/2014 0910   CREATININE 0.79 08/17/2015 1333   CREATININE 1.00 06/02/2014 0910   CALCIUM 8.9 08/17/2015 1333   CALCIUM 8.6* 06/02/2014 0910   PROT 7.6 08/17/2015 1333   PROT 6.3* 06/02/2014 0910   ALBUMIN 3.8 08/17/2015 1333   ALBUMIN 3.4* 06/02/2014 0910   AST 26 08/17/2015 1333   AST 23 06/02/2014 0910   ALT 17 08/17/2015 1333   ALT 10* 06/02/2014 0910   ALKPHOS 55 08/17/2015 1333   ALKPHOS 46 06/02/2014 0910   BILITOT 0.6 08/17/2015 1333   BILITOT 0.5 06/02/2014 0910   GFRNONAA >60 08/17/2015 1333   GFRNONAA 52* 06/02/2014 0910   GFRNONAA >60 03/10/2014 1326   GFRAA >60 08/17/2015 1333   GFRAA >60 06/02/2014 0910   GFRAA >60 03/10/2014 1326    No results found  for: SPEP, UPEP  Lab Results  Component Value Date   WBC 5.3 08/17/2015   NEUTROABS 2.3 08/17/2015   HGB 12.1 08/17/2015   HCT 36.0 08/17/2015   MCV 86.9 08/17/2015   PLT 389 08/17/2015      Chemistry      Component Value Date/Time   NA 136 08/17/2015 1333   NA 138 06/02/2014 0910   K 4.1 08/17/2015 1333   K 3.7 06/02/2014 0910   CL 104 08/17/2015 1333   CL 107 06/02/2014 0910   CO2 27 08/17/2015 1333   CO2 26 06/02/2014 0910   BUN 23* 08/17/2015 1333   BUN 17 06/02/2014 0910   CREATININE 0.79 08/17/2015 1333   CREATININE 1.00 06/02/2014 0910      Component Value Date/Time   CALCIUM 8.9 08/17/2015 1333   CALCIUM 8.6* 06/02/2014 0910   ALKPHOS 55 08/17/2015 1333   ALKPHOS 46 06/02/2014 0910   AST 26 08/17/2015 1333   AST 23 06/02/2014 0910   ALT 17 08/17/2015 1333   ALT 10* 06/02/2014 0910   BILITOT 0.6 08/17/2015 1333   BILITOT 0.5 06/02/2014 0910       RADIOGRAPHIC STUDIES: I have personally reviewed the radiological images as listed and agreed with the findings in the report. No results found.   ASSESSMENT & PLAN:  Malignant neoplasm of right fallopian tube [facility]) Metastatic fallopian tube cancer- currently on Avastin since February 2017. July 2017 CT scan shows- improved inguinal adenopathy. Recommend continued Avastin every 3 weeks; discussed the concerns of potential side effects of Avastin especially at her age.  We discussed the use of Zejula- maintenance therapy. Discussed the potential side effects including GI side effects nausea vomiting diarrhea; and anemia and thrombocytopenia.   # Hoasrseness of voice- unclear etiology; discussed ENT evaluation patient declined  # History of DVT currently not on anticoagulation.   # The above plan was discussed with the patient and her daughter in detail. Follow-up with me  in approximately 3 weeks.   I reviewed the images myself and with the patient and family in detail. A copy of this report was given.  # 25  minutes face-to-face with the patient discussing the above plan of care; more than 50% of time spent on prognosis/ natural history; counseling and coordination.    Orders Placed This Encounter  Procedures  . CBC with Differential    Standing Status: Future     Number of Occurrences:      Standing Expiration Date: 08/16/2016  . Comprehensive metabolic panel    Standing Status: Future     Number of Occurrences:      Standing Expiration Date: 08/16/2016    Order Specific Question:  Has the patient fasted?    Answer:  No  . Urinalysis complete, with microscopic [facility])    Standing Status: Future     Number of Occurrences:      Standing Expiration Date: 08/16/2016   All questions were answered. The patient knows to call the clinic with any problems, questions or concerns.      [name], MD 08/17/2015 8:18 PM

## 2015-08-17 NOTE — Progress Notes (Signed)
Use treatment plan dated 7/21 for 7/13, per Dr. Rogue Bussing. LJ

## 2015-08-17 NOTE — Telephone Encounter (Signed)
Pt daughter aware pos ecoli on  urine culture. [facility] from [facility] aware. Shelia advised to fax [facility] to [facility]. [phone] Debbie at pharmacy states i need to fax orders, labs, and office notes over. [phone]. [facility] to send out meds and Freda Munro to administer.

## 2015-08-17 NOTE — Progress Notes (Signed)
Patient states her appetite is not good.  Doesn't want anything to eat.  States she is very fatigued.  She is going to begin IV meds for bladder.  Patient accompanied by daughter who states patient is having a hard time breathing even though patient denies any SOB.

## 2015-08-18 LAB — CA 125: CA 125: 17.6 U/mL (ref 0.0–38.1)

## 2015-08-23 ENCOUNTER — Telehealth: Payer: Self-pay | Admitting: *Deleted

## 2015-08-23 NOTE — Telephone Encounter (Signed)
Contacted daughter-Sharon. Explained the need for Zejula. Explained the update on patient financial assistance.  Teach back process performed. [employer] direct phone number provided to daughter.

## 2015-08-23 NOTE — Telephone Encounter (Signed)
rcvd fax notification that patient's Zejula was approved by [employer]. I contacted patient to let her know that insurance approved drug.  I will contact [employer] and see when patient may expect delivery and copay assistance.  Reviewed with patient the need for Zejula. I explained that if shipment is rcvd sooner than expected that she is not to start on the Zejula until she notifies our office.  Pt will need Chemo education on Zejula and consent signed.

## 2015-08-23 NOTE — Telephone Encounter (Signed)
[employer] contacted at [phone]-  copay is $1000 for 1 month supply. [employer] currently in attempting to help pt with co-pay assistance.   While on phone-[employer] was able to update me on the status of pt financial assistance.  Was informed that Anthony Sar is advocating for financial assistance.  However, to help pt be elligible for pt assistance- pathology and last md office note need to be faxed to [employer] at [phone].

## 2015-08-23 NOTE — Telephone Encounter (Signed)
Patients daughter request call back regarding medication update.

## 2015-08-24 ENCOUNTER — Telehealth: Payer: Self-pay | Admitting: Obstetrics and Gynecology

## 2015-08-24 NOTE — Telephone Encounter (Signed)
Nicole Clayton aware per mad to have u/a and culture done asap. Nicole Clayton will collect urine and send results to office.

## 2015-08-24 NOTE — Telephone Encounter (Signed)
Nicole Clayton CALLED FROM [facility] AND PT HAS FINISHED HER ANTIBIOTIC AND THEY WANTED TO KNOW WHEN PT NEEDED TO COME IN TO DO ANOTHER URINE. SHEERIES NUMBER IS [phone]

## 2015-08-25 ENCOUNTER — Telehealth: Payer: Self-pay | Admitting: *Deleted

## 2015-08-25 NOTE — Telephone Encounter (Signed)
Spoke with Dr. Rogue Bussing via [employer] has been approved and ready to be shipped. Dr. Rogue Bussing would like to discontinue the Avastin orders.  Pt will be notified by Nada Boozer, RN regarding the plan of care.  msg sent to cancer center sch. Explained to sch. Dept that MD states that we can discontinue pt's avastin tx on 8/3.  pt will be started on a new oral drug for her cancer.

## 2015-08-25 NOTE — Telephone Encounter (Signed)
Called [employer] and spoke to [place] who states Nicole Clayton has been approved.  They will be reaching out to patient today regarding delivery.  No patient copay.    Contacted patient's daughter, [name], who will be bringing patient to [facility] on Tuesday @ 3:30 to sign consent and be educated on Zejula.

## 2015-08-25 NOTE — Telephone Encounter (Signed)
Rodena Piety, please contact [employer] back to see if there is any update on patient assistance for pt's Zejula.  Please ask to speak to Anthony Sar.  [phone]

## 2015-08-29 ENCOUNTER — Telehealth: Payer: Self-pay | Admitting: Obstetrics and Gynecology

## 2015-08-29 DIAGNOSIS — N390 Urinary tract infection, site not specified: Secondary | ICD-10-CM

## 2015-08-29 NOTE — Telephone Encounter (Signed)
Home health came by Friday urine sample and they haven't heard anything from them and wanted to know if you had heard anything. Delcina thinks she is getting uti. Please call [name].

## 2015-08-30 NOTE — Telephone Encounter (Signed)
Nicole Clayton pt daughter aware urine culture was neg. Nicole Clayton states Katlyne thinks she is getting another UTI. Will refer to [facility]. [place] aware to get pt in asap.

## 2015-08-31 ENCOUNTER — Encounter: Payer: Self-pay | Admitting: Obstetrics and Gynecology

## 2015-09-07 ENCOUNTER — Inpatient Hospital Stay: Payer: Medicare HMO | Attending: Internal Medicine | Admitting: Internal Medicine

## 2015-09-07 ENCOUNTER — Ambulatory Visit: Payer: Medicare HMO

## 2015-09-07 ENCOUNTER — Inpatient Hospital Stay: Payer: Medicare HMO

## 2015-09-07 VITALS — BP 118/83 | HR 69 | Temp 97.4°F | Resp 18 | Wt 139.4 lb

## 2015-09-07 DIAGNOSIS — R110 Nausea: Secondary | ICD-10-CM | POA: Diagnosis not present

## 2015-09-07 DIAGNOSIS — M25552 Pain in left hip: Secondary | ICD-10-CM | POA: Insufficient documentation

## 2015-09-07 DIAGNOSIS — Z800 Family history of malignant neoplasm of digestive organs: Secondary | ICD-10-CM | POA: Diagnosis not present

## 2015-09-07 DIAGNOSIS — C5701 Malignant neoplasm of right fallopian tube: Secondary | ICD-10-CM | POA: Diagnosis not present

## 2015-09-07 DIAGNOSIS — Z9221 Personal history of antineoplastic chemotherapy: Secondary | ICD-10-CM | POA: Insufficient documentation

## 2015-09-07 DIAGNOSIS — Z7982 Long term (current) use of aspirin: Secondary | ICD-10-CM | POA: Diagnosis not present

## 2015-09-07 DIAGNOSIS — C774 Secondary and unspecified malignant neoplasm of inguinal and lower limb lymph nodes: Secondary | ICD-10-CM | POA: Diagnosis not present

## 2015-09-07 DIAGNOSIS — Z8041 Family history of malignant neoplasm of ovary: Secondary | ICD-10-CM | POA: Insufficient documentation

## 2015-09-07 DIAGNOSIS — Z803 Family history of malignant neoplasm of breast: Secondary | ICD-10-CM | POA: Insufficient documentation

## 2015-09-07 DIAGNOSIS — Z923 Personal history of irradiation: Secondary | ICD-10-CM | POA: Diagnosis not present

## 2015-09-07 DIAGNOSIS — R490 Dysphonia: Secondary | ICD-10-CM | POA: Diagnosis not present

## 2015-09-07 DIAGNOSIS — R42 Dizziness and giddiness: Secondary | ICD-10-CM | POA: Diagnosis not present

## 2015-09-07 DIAGNOSIS — Z86718 Personal history of other venous thrombosis and embolism: Secondary | ICD-10-CM | POA: Diagnosis not present

## 2015-09-07 DIAGNOSIS — R590 Localized enlarged lymph nodes: Secondary | ICD-10-CM | POA: Diagnosis not present

## 2015-09-07 DIAGNOSIS — Z452 Encounter for adjustment and management of vascular access device: Secondary | ICD-10-CM | POA: Insufficient documentation

## 2015-09-07 DIAGNOSIS — N3281 Overactive bladder: Secondary | ICD-10-CM | POA: Insufficient documentation

## 2015-09-07 DIAGNOSIS — K219 Gastro-esophageal reflux disease without esophagitis: Secondary | ICD-10-CM | POA: Insufficient documentation

## 2015-09-07 DIAGNOSIS — I341 Nonrheumatic mitral (valve) prolapse: Secondary | ICD-10-CM | POA: Diagnosis not present

## 2015-09-07 DIAGNOSIS — R5383 Other fatigue: Secondary | ICD-10-CM | POA: Diagnosis not present

## 2015-09-07 LAB — COMPREHENSIVE METABOLIC PANEL
ALBUMIN: 3.6 g/dL (ref 3.5–5.0)
ALT: 15 U/L (ref 14–54)
AST: 28 U/L (ref 15–41)
Alkaline Phosphatase: 58 U/L (ref 38–126)
Anion gap: 7 (ref 5–15)
BILIRUBIN TOTAL: 0.5 mg/dL (ref 0.3–1.2)
BUN: 17 mg/dL (ref 6–20)
CHLORIDE: 106 mmol/L (ref 101–111)
CO2: 29 mmol/L (ref 22–32)
Calcium: 9.1 mg/dL (ref 8.9–10.3)
Creatinine, Ser: 0.98 mg/dL (ref 0.44–1.00)
GFR calc Af Amer: 59 mL/min — ABNORMAL LOW (ref >60)
GFR calc non Af Amer: 51 mL/min — ABNORMAL LOW (ref >60)
GLUCOSE: 118 mg/dL — AB (ref 65–99)
POTASSIUM: 4.7 mmol/L (ref 3.5–5.1)
Sodium: 142 mmol/L (ref 135–145)
Total Protein: 7.5 g/dL (ref 6.5–8.1)

## 2015-09-07 LAB — CBC WITH DIFFERENTIAL/PLATELET
BASOS ABS: 0.1 10*3/uL (ref 0–0.1)
BASOS PCT: 2 %
Eosinophils Absolute: 0.1 10*3/uL (ref 0–0.7)
Eosinophils Relative: 3 %
HEMATOCRIT: 36.9 % (ref 35.0–47.0)
Hemoglobin: 12.2 g/dL (ref 12.0–16.0)
Lymphocytes Relative: 37 %
Lymphs Abs: 1.8 10*3/uL (ref 1.0–3.6)
MCH: 29.6 pg (ref 26.0–34.0)
MCHC: 33 g/dL (ref 32.0–36.0)
MCV: 89.8 fL (ref 80.0–100.0)
MONO ABS: 0.5 10*3/uL (ref 0.2–0.9)
Monocytes Relative: 10 %
NEUTROS ABS: 2.3 10*3/uL (ref 1.4–6.5)
NEUTROS PCT: 48 %
PLATELETS: 403 10*3/uL (ref 150–440)
RBC: 4.11 MIL/uL (ref 3.80–5.20)
RDW: 17.3 % — AB (ref 11.5–14.5)
WBC: 4.8 10*3/uL (ref 3.6–11.0)

## 2015-09-07 LAB — URINALYSIS COMPLETE WITH MICROSCOPIC (ARMC ONLY)
BILIRUBIN URINE: NEGATIVE
GLUCOSE, UA: NEGATIVE mg/dL
Ketones, ur: NEGATIVE mg/dL
Nitrite: POSITIVE — AB
Protein, ur: NEGATIVE mg/dL
Specific Gravity, Urine: 1.011 (ref 1.005–1.030)
pH: 6 (ref 5.0–8.0)

## 2015-09-07 NOTE — Assessment & Plan Note (Addendum)
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 July 2017 CT scan shows- improved inguinal adenopathy. STOP avastin sec to concerns of SEs.   # Start zeujla today 2 pills/day; and if tolerating well up to 3 a day.  Recommend taking zofran prior to taking zejula.   #  Discussed the potential side effects including GI side effects nausea vomiting diarrhea; and anemia and thrombocytopenia.   # History of DVT currently not on anticoagulation.   # The above plan was discussed with the patient and her daughter in detail.   # cbc in 2 weeks/ follow up in 4 weeks/ cbc/cmp/ca- 125.

## 2015-09-07 NOTE — Progress Notes (Signed)
[place] OFFICE PROGRESS NOTE  Patient Care Team: Ezequiel Kayser, [name]  No matching staging information was found for the patient.   Oncology History   . 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  Adequate TRS, no macroscopic residual. IP/IV chemotherapy with DDP and paclitaxel on GOG protocol, chemo and Bev consolidation completed in 03/2011 2. 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 November 14, 2011- Patient was started on carboplatin and gemcitabine 4. Finished 6 cycles of chemotherapy in April of 2014 with carboplatin and gemcitabine. Tolerance was fairly good except for neutropenia and thrombocytopenia. 5.recurrent disease by CT scan February of 2015 6.radiation therapy to pelvis  May of 2015 7.upper extremity  . and deep vein thrombosis associated with port.(June of 2015) patient started on   [place] had port removed and had   thrombectomy September 06, 2013. # CT DEC 2016- progressing disease in the right inguinal area # FEB-TAXOL-AVASTIN;  # FEB 2017- AVASTIN q 3W; July 2017- CT- Improved  Right Inguinal LN; STOP AVASTIN sec to potential concerns of AEs  # AUG 2017 3rd- START ZEJULA       Malignant neoplasm of right fallopian tube ([place])   10/05/2009 Initial Diagnosis    Carcinoma of fallopian tube       INTERVAL HISTORY:  Nicole Clayton 82 y.o.  female pleasant patient above history of Fallopian tube cancer metastatic currently on maintenance Avastin- Is here to start Zejula.   Patient is concerned about the potential side effects of Zejula. She has not started the medication yet.   Otherwise no fever. Chills. No nausea or vomiting. Appetite is fair. She is chronic swelling of right lower extremity- to be due to her lymph node masses in the right groin.   REVIEW OF SYSTEMS:  A complete 10 point review of system is done which is  negative except mentioned above/history of present illness. Complains of hoarseness of voice.  PAST MEDICAL HISTORY :  Past Medical History:  Diagnosis Date  . Bladder infection, chronic 10/19/2011  . Cancer (HCC)    Ovarian   . Carcinoma of fallopian tube ([place]) 10/05/2009   Overview:  Overview:  Overview:  Drs. Claiborne Rigg and Delorise Shiner [place]ok[place] Overview:  Drs. Claiborne Rigg and [employer]   . GERD (gastroesophageal reflux disease)   . MI (mitral incompetence) 10/27/2014   Overview:  MODERATE   . Mitral valve prolapse   . OAB (overactive bladder)   . Tachycardia     PAST SURGICAL HISTORY :   Past Surgical History:  Procedure Laterality Date  . ABDOMINAL HYSTERECTOMY    . CHOLECYSTECTOMY    . LAPAROSCOPIC BILATERAL SALPINGO OOPHERECTOMY  2011    FAMILY HISTORY :   Family History  Problem Relation Age of Onset  . Breast cancer Neg Hx   . Colon cancer Neg Hx   . Diabetes Neg Hx   . Heart disease Neg Hx   . Ovarian cancer Other     SOCIAL HISTORY:   Social History  Substance Use Topics  . Smoking status: Never Smoker  . Smokeless tobacco: Not on file  . Alcohol use No    ALLERGIES:  is allergic to benadryl [diphenhydramine]; tamsulosin; alendronate sodium; duloxetine hcl; risedronate sodium; nitrofurantoin; lovastatin; and sulfa antibiotics.  MEDICATIONS:  Current Outpatient Prescriptions  Medication Sig Dispense Refill  . acetaminophen (TYLENOL) 500 MG tablet Take 1 tablet (500  mg total) by mouth every 6 (six) hours as needed. 30 tablet 0  . Ascorbic Acid (VITAMIN C) 100 MG tablet Take 100 mg by mouth daily.    Marland Kitchen aspirin EC 81 MG tablet Take 81 mg by mouth.    . cetirizine (ZYRTEC) 10 MG tablet Take by mouth.    . Cholecalciferol (VITAMIN D3) 2000 UNITS capsule Take by mouth.    . Cranberry 1000 MG CAPS Take by mouth.    . docusate sodium (COLACE) 100 MG capsule Take 100 mg by mouth.    . ertapenem 1 g in sodium chloride 0.9 % 50 mL Inject 1 g into the vein daily.  For 7 days 7 g 0  . fluconazole (DIFLUCAN) 150 MG tablet Take 1 tablet (150 mg total) by mouth once. Can take additional dose three days later if symptoms persist 1 tablet 3  . fluticasone (FLONASE) 50 MCG/ACT nasal spray Place into the nose.    . gabapentin (NEURONTIN) 600 MG tablet Take 1 tablet (600 mg total) by mouth 3 (three) times daily. 180 tablet 3  . imipenem-cilastatin 500 mg in sodium chloride 0.9 % 100 mL Inject 500 mg into the vein every 8 (eight) hours. Use port cath for injections. Start 07/21/15 at 2:00 pm, end 07/29/15 at 10 pm 500 mg of imipenem 26  . loratadine (CLARITIN) 10 MG tablet Take by mouth.    . metoprolol succinate (TOPROL-XL) 50 MG 24 hr tablet     . Niraparib Tosylate (ZEJULA) 100 MG CAPS Take 300 mg by mouth once. Take 3 pills at bed time; IF NEEDED take a nausea pill 30 mins prior. 90 capsule 3  . nystatin ointment (MYCOSTATIN) Apply 1 application topically 2 (two) times daily. 30 g 0  . ondansetron (ZOFRAN) 4 MG tablet TAKE (1) TABLET BY MOUTH EVERY 8 HOURS AS NEEDED FOR NAUSEA/VOMITING 30 tablet 3  . pantoprazole (PROTONIX) 40 MG tablet Take 40 mg by mouth.    . triamcinolone ointment (KENALOG) 0.1 % Apply 1 application topically 2 (two) times daily. 30 g 0  . metoprolol succinate (TOPROL-XL) 50 MG 24 hr tablet Take 50 mg by mouth.     No current facility-administered medications for this visit.    Facility-Administered Medications Ordered in Other Visits  Medication Dose Route Frequency Provider Last Rate Last Dose  . diphenhydrAMINE (BENADRYL) injection 50 mg  50 mg Intravenous Once Forest Gleason, MD      . sodium chloride 0.9 % injection 10 mL  10 mL Intravenous PRN Forest Gleason, MD   10 mL at 01/12/15 1116  . sodium chloride 0.9 % injection 10 mL  10 mL Intravenous PRN Forest Gleason, MD   10 mL at 02/16/15 1025    PHYSICAL EXAMINATION: ECOG PERFORMANCE STATUS: 2 - Symptomatic, <50% confined to bed  BP 118/83 (BP Location: Left Arm, Patient Position:  Sitting)   Pulse 69   Temp 97.4 F (36.3 C)   Resp 18   Wt 139 lb 6 oz (63.2 kg)   BMI 27.22 kg/m   Filed Weights   09/07/15 0955  Weight: 139 lb 6 oz (63.2 kg)    GENERAL: Well-nourished well-developed; Alert, no distress and comfortable.  She is walking herself. Accompanied by daughter. EYES: no pallor or icterus OROPHARYNX: no thrush or ulceration; good dentition  NECK: supple, no masses felt LYMPH:  no palpable lymphadenopathy in the cervical, axillary; Right ingiunal LN ~1-2cm in size.  LUNGS: clear to auscultation and  No wheeze or  crackles HEART/CVS: regular rate & rhythm and no murmurs; chronic right lower extremity swelling ABDOMEN:abdomen soft, non-tender and normal bowel sounds Musculoskeletal:no cyanosis of digits and no clubbing  PSYCH: alert & oriented x 3 with fluent speech NEURO: no focal motor/sensory deficits SKIN:  no rashes or significant lesions  LABORATORY DATA:  I have reviewed the data as listed    Component Value Date/Time   NA 142 09/07/2015 0907   NA 138 06/02/2014 0910   K 4.7 09/07/2015 0907   K 3.7 06/02/2014 0910   CL 106 09/07/2015 0907   CL 107 06/02/2014 0910   CO2 29 09/07/2015 0907   CO2 26 06/02/2014 0910   GLUCOSE 118 (H) 09/07/2015 0907   GLUCOSE 106 (H) 06/02/2014 0910   BUN 17 09/07/2015 0907   BUN 17 06/02/2014 0910   CREATININE 0.98 09/07/2015 0907   CREATININE 1.00 06/02/2014 0910   CALCIUM 9.1 09/07/2015 0907   CALCIUM 8.6 (L) 06/02/2014 0910   PROT 7.5 09/07/2015 0907   PROT 6.3 (L) 06/02/2014 0910   ALBUMIN 3.6 09/07/2015 0907   ALBUMIN 3.4 (L) 06/02/2014 0910   AST 28 09/07/2015 0907   AST 23 06/02/2014 0910   ALT 15 09/07/2015 0907   ALT 10 (L) 06/02/2014 0910   ALKPHOS 58 09/07/2015 0907   ALKPHOS 46 06/02/2014 0910   BILITOT 0.5 09/07/2015 0907   BILITOT 0.5 06/02/2014 0910   GFRNONAA 51 (L) 09/07/2015 0907   GFRNONAA 52 (L) 06/02/2014 0910   GFRAA 59 (L) 09/07/2015 0907   GFRAA >60 06/02/2014 0910     No results found for: SPEP, UPEP  Lab Results  Component Value Date   WBC 4.8 09/07/2015   NEUTROABS 2.3 09/07/2015   HGB 12.2 09/07/2015   HCT 36.9 09/07/2015   MCV 89.8 09/07/2015   PLT 403 09/07/2015      Chemistry      Component Value Date/Time   NA 142 09/07/2015 0907   NA 138 06/02/2014 0910   K 4.7 09/07/2015 0907   K 3.7 06/02/2014 0910   CL 106 09/07/2015 0907   CL 107 06/02/2014 0910   CO2 29 09/07/2015 0907   CO2 26 06/02/2014 0910   BUN 17 09/07/2015 0907   BUN 17 06/02/2014 0910   CREATININE 0.98 09/07/2015 0907   CREATININE 1.00 06/02/2014 0910      Component Value Date/Time   CALCIUM 9.1 09/07/2015 0907   CALCIUM 8.6 (L) 06/02/2014 0910   ALKPHOS 58 09/07/2015 0907   ALKPHOS 46 06/02/2014 0910   AST 28 09/07/2015 0907   AST 23 06/02/2014 0910   ALT 15 09/07/2015 0907   ALT 10 (L) 06/02/2014 0910   BILITOT 0.5 09/07/2015 0907   BILITOT 0.5 06/02/2014 0910       RADIOGRAPHIC STUDIES: I have personally reviewed the radiological images as listed and agreed with the findings in the report. No results found.   ASSESSMENT & PLAN:  Malignant neoplasm of right fallopian tube [facility]) Metastatic fallopian tube cancer- currently on Avastin since February 2017. July 2017 CT scan shows- improved inguinal adenopathy. STOP avastin sec to concerns of SEs.   # Start zeujla today 2 pills/day; and if tolerating well up to 3 a day.  Recommend taking zofran prior to taking zejula.   #  Discussed the potential side effects including GI side effects nausea vomiting diarrhea; and anemia and thrombocytopenia.   # History of DVT currently not on anticoagulation.   # The above plan was discussed  with the patient and her daughter in detail.   # cbc in 2 weeks/ follow up in 4 weeks/ cbc/cmp/ca- 125.    Orders Placed This Encounter  Procedures  . CBC with Differential    Standing Status:   Future    Standing Expiration Date:   09/06/2016  . Comprehensive metabolic  panel    Standing Status:   Future    Standing Expiration Date:   09/06/2016  . CA 125    Standing Status:   Future    Standing Expiration Date:   09/06/2016  . CBC with Differential    Standing Status:   Future    Standing Expiration Date:   09/06/2016   All questions were answered. The patient knows to call the clinic with any problems, questions or concerns.      [name], MD 09/07/2015 8:52 PM

## 2015-09-07 NOTE — Progress Notes (Signed)
Patient has edema in lower extremities, worse on right.

## 2015-09-07 NOTE — Progress Notes (Signed)
Pt agreed to start Zejula. Formal oral chemo education previously performed last week by Nada Boozer, RN.  Consent officially signed in office today by patient.

## 2015-09-10 NOTE — Progress Notes (Signed)
09/11/2015 12:25 PM   Nicole Clayton 01/07/1929 [id number]  Referring provider: Ezequiel Kayser, MD [facility] [place], [place] [address]  Chief Complaint  Patient presents with  . Follow-up    HPI: Patient is a 82 -year-old Caucasian female who is referred to [place] by, Dr. Raechel Ache, for recurrent urinary tract infections.  Patient states that she has had an urinary tract infection every week this summer.    Her symptoms with a urinary tract infection consist of suprapubic pain, dysuria and a bad odor to her urine.  .  She denies gross hematuria, abdominal pain or flank pain.  She has not had any recent fevers, chills, nausea or vomiting.   She does not have a history of nephrolithiasis or GU trauma.   She does have a history of GU surgery (pelvic sling).    Reviewing her records,  she has had two documented infections over the last year.    + Proteus- pan sensitive on 11/06/2014  + E. Coli- variable sensitivities on 07/14/2015  She is post menopausal.  She denies constipation and/or diarrhea.  She does engage in good perineal hygiene. She does not take tub baths.  She has not had incontinence.    She had a CT abd/pelvis with contrast due to a history of fallopian tube carcinoma.  No abnormality seen in the kidney or the bladder.  She is drinking 16 oz of water daily.     PMH: Past Medical History:  Diagnosis Date  . Bladder infection, chronic 10/19/2011  . Cancer (HCC)    Ovarian   . 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 Claiborne Rigg and Delorise Shiner Choksi Overview:  Drs. Claiborne Rigg and [employer]   . GERD (gastroesophageal reflux disease)   . MI (mitral incompetence) 10/27/2014   Overview:  MODERATE   . Mitral valve prolapse   . OAB (overactive bladder)   . Tachycardia     Surgical History: Past Surgical History:  Procedure Laterality Date  . ABDOMINAL HYSTERECTOMY    . CHOLECYSTECTOMY    .  LAPAROSCOPIC BILATERAL SALPINGO OOPHERECTOMY  2011    Home Medications:    Medication List       Accurate as of 09/11/15 12:25 PM. Always use your most recent med list.          acetaminophen 500 MG tablet Commonly known as:  TYLENOL Take 1 tablet (500 mg total) by mouth every 6 (six) hours as needed.   aspirin EC 81 MG tablet Take 81 mg by mouth.   cetirizine 10 MG tablet Commonly known as:  ZYRTEC Take by mouth.   conjugated estrogens vaginal cream Commonly known as:  PREMARIN Place 1 Applicatorful vaginally daily. Apply 0.5mg  (pea-sized amount)  just inside the vaginal introitus with a finger-tip every night for two weeks and then Monday, Wednesday and Friday nights.   Cranberry 1000 MG Caps Take by mouth.   docusate sodium 100 MG capsule Commonly known as:  COLACE Take 100 mg by mouth.   ertapenem 1 g in sodium chloride 0.9 % 50 mL Inject 1 g into the vein daily. For 7 days   estradiol 0.1 MG/GM vaginal cream Commonly known as:  ESTRACE VAGINAL Apply 0.5mg  (pea-sized amount)  just inside the vaginal introitus with a finger-tip every night for two weeks and then Monday, Wednesday and Friday nights.   fluconazole 150 MG tablet Commonly known as:  DIFLUCAN Take  1 tablet (150 mg total) by mouth once. Can take additional dose three days later if symptoms persist   fluticasone 50 MCG/ACT nasal spray Commonly known as:  FLONASE Place into the nose.   gabapentin 600 MG tablet Commonly known as:  NEURONTIN Take 1 tablet (600 mg total) by mouth 3 (three) times daily.   imipenem-cilastatin 500 mg in sodium chloride 0.9 % 100 mL Inject 500 mg into the vein every 8 (eight) hours. Use port cath for injections. Start 07/21/15 at 2:00 pm, end 07/29/15 at 10 pm   loratadine 10 MG tablet Commonly known as:  CLARITIN Take by mouth.   metoprolol succinate 50 MG 24 hr tablet Commonly known as:  TOPROL-XL Take 50 mg by mouth.   metoprolol succinate 50 MG 24 hr  tablet Commonly known as:  TOPROL-XL   Niraparib Tosylate 100 MG Caps Commonly known as:  ZEJULA Take 300 mg by mouth once. Take 3 pills at bed time; IF NEEDED take a nausea pill 30 mins prior.   nystatin ointment Commonly known as:  MYCOSTATIN Apply 1 application topically 2 (two) times daily.   ondansetron 4 MG tablet Commonly known as:  ZOFRAN TAKE (1) TABLET BY MOUTH EVERY 8 HOURS AS NEEDED FOR NAUSEA/VOMITING   pantoprazole 40 MG tablet Commonly known as:  PROTONIX Take 40 mg by mouth.   triamcinolone ointment 0.1 % Commonly known as:  KENALOG Apply 1 application topically 2 (two) times daily.   vitamin C 100 MG tablet Take 100 mg by mouth daily.   Vitamin D3 2000 units capsule Take by mouth.       Allergies:  Allergies  Allergen Reactions  . Benadryl [Diphenhydramine] Shortness Of Breath    Sob, rash, swelling  . Tamsulosin Swelling  . Alendronate Sodium     Other reaction(s): Other (See Comments) Dysphagia  . Duloxetine Hcl Diarrhea  . Risedronate Sodium Rash    Aching, dysphagia  . Nitrofurantoin Other (See Comments)  . Lovastatin     Other reaction(s): Other (See Comments) GI upset  . Sulfa Antibiotics Rash    Family History: Family History  Problem Relation Age of Onset  . Breast cancer Neg Hx   . Colon cancer Neg Hx   . Diabetes Neg Hx   . Heart disease Neg Hx   . Ovarian cancer Other     Social History:  reports that she has never smoked. She has never used smokeless tobacco. She reports that she does not drink alcohol or use drugs.  ROS: UROLOGY Frequent Urination?: No Hard to postpone urination?: Yes Burning/pain with urination?: Yes Get up at night to urinate?: Yes Leakage of urine?: No Urine stream starts and stops?: No Trouble starting stream?: Yes Do you have to strain to urinate?: Yes Blood in urine?: No Urinary tract infection?: Yes Sexually transmitted disease?: No Injury to kidneys or bladder?: No Painful intercourse?:  No Weak stream?: No Currently pregnant?: No Vaginal bleeding?: No Last menstrual period?: n  Gastrointestinal Nausea?: No Vomiting?: No Indigestion/heartburn?: No Diarrhea?: No Constipation?: No  Constitutional Fever: No Night sweats?: No Weight loss?: No Fatigue?: No  Skin Skin rash/lesions?: No Itching?: No  Eyes Blurred vision?: No Double vision?: No  Ears/Nose/Throat Sore throat?: No Sinus problems?: No  Hematologic/Lymphatic Swollen glands?: Yes Easy bruising?: No  Cardiovascular Leg swelling?: Yes Chest pain?: No  Respiratory Cough?: No Shortness of breath?: No  Endocrine Excessive thirst?: No  Musculoskeletal Back pain?: No Joint pain?: No  Neurological Headaches?: No Dizziness?: No  Psychologic Depression?: No Anxiety?: No  Physical Exam: BP 131/82 (BP Location: Left Arm, Patient Position: Sitting, Cuff Size: Normal)   Pulse 75   Ht 5' (1.524 m)   Wt 138 lb (62.6 kg)   BMI 26.95 kg/m   Constitutional: Well nourished. Alert and oriented, No acute distress. HEENT: [place] AT, moist mucus membranes. Trachea midline, no masses. Cardiovascular: No clubbing, cyanosis, or edema. Respiratory: Normal respiratory effort, no increased work of breathing. GI: Abdomen is soft, non tender, non distended, no abdominal masses. Liver and spleen not palpable.  No hernias appreciated.  Stool sample for occult testing is not indicated.   GU: No CVA tenderness.  No bladder fullness or masses.  Atrophic external genitalia, normal pubic hair distribution, no lesions.  Normal urethral meatus, no lesions, no prolapse, no discharge.   Urethral caruncle is noted.  Pessary in place,   No bladder fullness, tenderness or masses. Pale vagina mucosa, poor estrogen effect, no discharge, no lesions.   Anus and perineum are without rashes or lesions.    Skin: No rashes, bruises or suspicious lesions. Lymph: No cervical or inguinal adenopathy. Neurologic: Grossly intact, no  focal deficits, moving all 4 extremities. Psychiatric: Normal mood and affect.  Laboratory Data: Lab Results  Component Value Date   WBC 4.8 09/07/2015   HGB 12.2 09/07/2015   HCT 36.9 09/07/2015   MCV 89.8 09/07/2015   PLT 403 09/07/2015    Lab Results  Component Value Date   CREATININE 0.98 09/07/2015    Lab Results  Component Value Date   TSH 1.99 02/03/2014    Lab Results  Component Value Date   AST 28 09/07/2015   Lab Results  Component Value Date   ALT 15 09/07/2015    Pertinent Imaging: CLINICAL DATA:  Restaging fallopian tube carcinoma. Initial diagnosis 2011.  EXAM: CT ABDOMEN AND PELVIS WITH CONTRAST  TECHNIQUE: Multidetector CT imaging of the abdomen and pelvis was performed using the standard protocol following bolus administration of intravenous contrast.  CONTRAST:  45mL ISOVUE-300 IOPAMIDOL (ISOVUE-300) INJECTION 61%  COMPARISON:  01/11/2015  FINDINGS: Lower chest: The lung bases are clear of acute process. No worrisome pulmonary nodules or pleural effusion. The heart is within normal limits in size for age. No pericardial effusion. Stable large hiatal hernia.  Hepatobiliary: No focal hepatic lesions or intrahepatic biliary dilatation. The hemangioma involving the right hepatic lobe near the kidney is not as well seen on today's examination.  Pancreas: Stable mild common bile duct dilatation status post cholecystectomy. No pancreatic mass, inflammation or ductal dilatation.  Spleen: Stable small spleen.  No focal lesions.  Adrenals/Urinary Tract: The adrenal glands and kidneys are unremarkable and stable.  Stomach/Bowel: Most of the stomach is up in the chest. The duodenum, small bowel and colon are unremarkable. No inflammatory changes, mass lesions or obstructive findings. Stable diverticulosis. No findings for acute diverticulitis. The terminal ileum is normal.  Vascular/Lymphatic: No mesenteric or retroperitoneal  mass or adenopathy. Small scattered lymph nodes are stable. The aorta is normal in caliber. Stable atherosclerotic calcifications. The major venous structures are patent.  Reproductive: Status post hysterectomy.  Other: No pelvic mass or lymphadenopathy. The bladder appears normal. A pessary ring is noted in the vagina.  Interval decrease in size of the necrotic adenopathy in the right inguinal area. The more superior and lateral nodal lesion measures 27 x 19 mm and previously measured 32 x 31 mm. The more inferior and medial lesion measures 25 x 24 mm and previously measured  48 x 35 mm. No left-sided inguinal adenopathy.  Musculoskeletal: No significant bony findings.  IMPRESSION: 1. Interval decrease in size of the necrotic appearing right inguinal adenopathy. 2. No abdominal/pelvic lymphadenopathy or findings for peritoneal surface disease. 3. Status post cholecystectomy with stable mild associated biliary dilatation. 4. Stable large hiatal hernia.   Electronically Signed   By: Marijo Sanes M.D.   On: 08/09/2015 12:01      Assessment & Plan:    1. Recurrent UTI's Patient is instructed to increase her water intake until the urine is pale yellow or clear.  I have advised her to take probiotics (yogurt, oral pills or vaginal suppositories), take cranberry pills or drink the juice and use the estrogen cream.  She is to take Vitamin C 1,000 mg daily to acidify the urine.  She should also avoid soaking in tubs and wipe front to back after urinating.    Because of her history of recurrent UTIs, I have asked the patient to contact our office if she should experience symptoms of urinary tract infection so that we can CATH her for an urine specimen for urinalysis and culture. This is to prevent a skin contaminant from showing up in the urine culture.  If she should have her symptoms after hours or cannot get to our office, she should notify her other providers that she needs  a catheterized specimen for UA and culture.   I reviewed the symptoms of a urinary tract infection, such as a worsening of urinary urgency and frequency, dysuria, which is painful urination and not the pain of urine hitting sensitive perineal skin, hematuria, foul-smelling urine, suprapubic pain or mental status changes. Fevers, chills, nausea and or vomiting can also be signs of a possible UTI.  Positive urinalyses and positive urine cultures that are not associated with urinary symptoms should not be treated with antibiotics.    I explained to the patient that being exposed to unnecessary antibiotics can put her at risk for increasing resistance of the bacteria to antibiotics, C. difficile and the side effects of the antibiotics.    - further research has noted that a three month cycle of suppressive antibiotics may help break the recurrence of UTI's- will consider when she returns for follow up and discontinue the vaginal estrogen cream                               2. Vaginal atrophy  - literature review was not conclusive whether hormone therapy is helpful or hurtful in fallopian tube cancer  - I explained to the patient that when women go through menopause and her estrogen levels are severely diminished, the normal vaginal flora will change.  This is due to an increase of the vaginal canal's pH. Because of this, the vaginal canal may be colonized by bacteria from the rectum instead of the protective lactobacillus.  This accompanied by the loss of the mucus barrier with vaginal atrophy is a cause of recurrent urinary tract infections.  In some studies, it has been demonstrated that patients can experience a reduction in recurrent urinary tract infections to one a year.   Patient was given a sample of vaginal estrogen cream (Premarin/Estrace) and instructed to apply 0.5mg  (pea-sized amount)  just inside the vaginal introitus with a finger-tip every night for two weeks and then Monday, Wednesday  and Friday nights.  I explained to the patient that vaginally administered estrogen, which causes only a  slight increase in the blood estrogen levels, have fewer contraindications and adverse systemic effects that oral HT.  I have also given prescriptions for the Estrace cream and Premarin cream, so that the patient may carry them to the pharmacy to see which one of the branded creams would be most economical for her.  She will return in 2 weeks for symptom recheck, exam and report if she was available to require the vaginal cream by prescription.  3. Cystocele  - managed by gynecology  Return in about 2 weeks (around 09/25/2015) for exam.  These notes generated with voice recognition software. I apologize for typographical errors.  Zara Council, [facility] [address], [place], [place] [address] [phone]

## 2015-09-11 ENCOUNTER — Encounter: Payer: Self-pay | Admitting: Urology

## 2015-09-11 ENCOUNTER — Ambulatory Visit (INDEPENDENT_AMBULATORY_CARE_PROVIDER_SITE_OTHER): Payer: Medicare HMO | Admitting: Urology

## 2015-09-11 VITALS — BP 131/82 | HR 75 | Ht 60.0 in | Wt 138.0 lb

## 2015-09-11 DIAGNOSIS — N952 Postmenopausal atrophic vaginitis: Secondary | ICD-10-CM

## 2015-09-11 DIAGNOSIS — N8110 Cystocele, unspecified: Secondary | ICD-10-CM | POA: Diagnosis not present

## 2015-09-11 DIAGNOSIS — D649 Anemia, unspecified: Secondary | ICD-10-CM | POA: Insufficient documentation

## 2015-09-11 DIAGNOSIS — IMO0002 Reserved for concepts with insufficient information to code with codable children: Secondary | ICD-10-CM

## 2015-09-11 DIAGNOSIS — IMO0001 Reserved for inherently not codable concepts without codable children: Secondary | ICD-10-CM

## 2015-09-11 DIAGNOSIS — N390 Urinary tract infection, site not specified: Secondary | ICD-10-CM | POA: Diagnosis not present

## 2015-09-11 MED ORDER — ESTRADIOL 0.1 MG/GM VA CREA: TOPICAL_CREAM | VAGINAL | 12 refills | Status: DC

## 2015-09-11 MED ORDER — ESTROGENS, CONJUGATED 0.625 MG/GM VA CREA: 1.0000 | TOPICAL_CREAM | Freq: Every day | VAGINAL | 12 refills | Status: DC

## 2015-09-11 NOTE — Patient Instructions (Signed)
                                             Urinary Tract Infection Prevention Patient Education Stay Hydrated: Urinary tract infections (UTIs) are less likely to occur in someone who is drinking enough water to promote regular urination, so it is very important to stay hydrated in order to help flush out bacteria from the urinary tract. Respond to "Nature's Call": It is always a good idea to urinate as soon as you feel the need. While "holding it in" does not directly cause an infection, it can cause overdistension that can damage the lining of the bladder, making it more vulnerable to bacteria. Remove Tampons Before Going: Remember to always take out tampons before urinating, and change tampons often.  Practice Proper Bathroom Hygiene: To keep bacteria near the urethral opening to a minimum, it is important to practice proper wiping techniques (i.e. front to back wiping) to help prevent rectal bacteria from entering the uretro-genital area. It can also be helpful to take showers and avoid soaking in the bathtub.  Take a Vitamin C Supplement: About 1,000 milligrams of vitamin C taken daily can help inhibit the growth of some bacteria by acidifying the urine. Maintain Control with Cranberries: Cranberries contain hippuronic acid, which is a natural antiseptic that may help prevent the adherence of bacteria to the bladder lining. Drinking 100% pure cranberry juice or taking over the counter cranberry supplements twice daily may help to prevent an infection. However, it is important to note that cranberry juices/supplements are not helpful once a urinary tract infection (UTI) is present. Strengthen Your Core: Often, a lazy bladder (unable to empty urine properly) occurs due to lower back problem, so consider doing exercises to help strengthen your back, pelvic floor, and stomach muscles.  Pay Attention to Your Urine: Your urine can change color for a variety of reasons, including from the medications you  take, so pay close attention to it to monitor your overall health. One key thing to note is that if your urine is typically a darker yellow, your body is dehydrated, so you need to step up your water intake.     I explained to the patient that when women go through menopause and her estrogen levels are severely diminished, the normal vaginal flora will change.  This is due to an increase of the vaginal canal's pH. Because of this, the vaginal canal may be colonized by bacteria from the rectum instead of the protective lactobacillus.  This accompanied by the loss of the mucus barrier with vaginal atrophy is a cause of recurrent urinary tract infections.  In some studies, it has been demonstrated that patients can experience a reduction in recurrent urinary tract infections to one a year.    I have given you two prescriptions for a vaginal estrogen cream.  Estrace and Premarin.  Please take these to your pharmacy and see which one your insurance covers.  If both are too expensive, please call the office at [phone] for an alternative.  You are given a sample of vaginal estrogen cream (Estrace) and instructed to apply 0.5mg  (pea-sized amount)  just inside the vaginal introitus with a finger-tip every night for two weeks.

## 2015-09-15 ENCOUNTER — Telehealth: Payer: Self-pay | Admitting: *Deleted

## 2015-09-15 ENCOUNTER — Ambulatory Visit: Payer: Medicare HMO

## 2015-09-15 VITALS — BP 154/86 | HR 70 | Temp 98.0°F | Wt 136.5 lb

## 2015-09-15 DIAGNOSIS — N390 Urinary tract infection, site not specified: Secondary | ICD-10-CM

## 2015-09-15 LAB — URINALYSIS, COMPLETE
BILIRUBIN UA: NEGATIVE
GLUCOSE, UA: NEGATIVE
KETONES UA: NEGATIVE
NITRITE UA: POSITIVE — AB
SPEC GRAV UA: 1.015 (ref 1.005–1.030)
UUROB: 0.2 mg/dL (ref 0.2–1.0)
pH, UA: 6.5 (ref 5.0–7.5)

## 2015-09-15 LAB — MICROSCOPIC EXAMINATION: RBC MICROSCOPIC, UA: NONE SEEN /HPF (ref 0–?)

## 2015-09-15 MED ORDER — CIPROFLOXACIN HCL 500 MG PO TABS: 500.0000 mg | ORAL_TABLET | Freq: Two times a day (BID) | ORAL | 0 refills | Status: DC

## 2015-09-15 NOTE — Telephone Encounter (Signed)
Patient called to say that she thinks she has a uti. Patient describes dysuria and lower abdominal pain. Per Larene Beach note from ov on august 8th 2017 because of patient hx she should call the office whenever she thinks she has an uti so she can come in for a cath specimen  For ua and culture. Patient put on nurse schedule for 2:00 today.

## 2015-09-15 NOTE — Progress Notes (Signed)
In and Out Catheterization  Patient is present today for a I & O catheterization due to possible UTI. Patient was cleaned and prepped in a sterile fashion with betadine and Lidocaine 2% jelly was instilled into the urethra.  A 14FR cath was inserted no complications were noted , 62ml of urine return was noted, urine was cloudy and yellow in color. A clean urine sample was collected for u/a and cx. Bladder was drained  And catheter was removed with out difficulty.    Preformed by: Toniann Fail, LPN  Follow up/ Additional notes: Per Dr. Pilar Jarvis pt was given cipro 500 once a day x7days. Pt VS- 136.5lb, 154/86, 70, 98.6. Pt described UTI like s/s to be lower abd pain, dysuria, hard time urinating, and back pain. Pt denied n/v, f/c.

## 2015-09-15 NOTE — Telephone Encounter (Signed)
Returned call to Nicole Clayton to inform her that per Dr Rogue Bussing, stop the [facility] for now and will reevaluate when he sees her on 8/31. She stated I thought that is what he would say

## 2015-09-15 NOTE — Telephone Encounter (Signed)
Started taking Zejula 8/3, took 2 tabs at HS 8/3 and 8/4, has had extreme nausea, vomiting and weakness, unable to function. She decreased dose to 1 tab at HS 8/5,6,7,8,9. She was still unable to tolerate being lunch time before she could function and think, so she did not take it last night and she notes that she feels much better today. FU appt is 8/3. Please advise

## 2015-09-18 ENCOUNTER — Encounter: Payer: Self-pay | Admitting: Obstetrics and Gynecology

## 2015-09-18 ENCOUNTER — Telehealth: Payer: Self-pay

## 2015-09-18 DIAGNOSIS — N390 Urinary tract infection, site not specified: Secondary | ICD-10-CM

## 2015-09-18 LAB — CULTURE, URINE COMPREHENSIVE

## 2015-09-18 MED ORDER — NITROFURANTOIN MONOHYD MACRO 100 MG PO CAPS
100.0000 mg | ORAL_CAPSULE | Freq: Two times a day (BID) | ORAL | 0 refills | Status: AC
Start: 2015-09-18 — End: 2015-09-25

## 2015-09-18 NOTE — Telephone Encounter (Signed)
-----   Message from Nori Riis, PA-C sent at 09/18/2015  8:13 AM EDT ----- Please find the patient that she has a positive urine culture. She will need to start nitrofurantoin 100 mg twice daily for 7 days.

## 2015-09-18 NOTE — Telephone Encounter (Signed)
Spoke with pt in reference to +ucx. Made pt aware to stop cipro and start macrobid. Pt voiced concern about pessary. Reinforced with pt to call Dr. Buckner [place] office. Pt voiced understanding of whole conversation.

## 2015-09-21 ENCOUNTER — Inpatient Hospital Stay: Payer: Medicare HMO

## 2015-09-21 DIAGNOSIS — C5701 Malignant neoplasm of right fallopian tube: Secondary | ICD-10-CM

## 2015-09-25 ENCOUNTER — Encounter: Payer: Self-pay | Admitting: Urology

## 2015-09-25 ENCOUNTER — Ambulatory Visit (INDEPENDENT_AMBULATORY_CARE_PROVIDER_SITE_OTHER): Payer: Medicare HMO | Admitting: Urology

## 2015-09-25 VITALS — BP 135/77 | HR 71 | Ht 60.0 in | Wt 138.5 lb

## 2015-09-25 DIAGNOSIS — N390 Urinary tract infection, site not specified: Secondary | ICD-10-CM | POA: Diagnosis not present

## 2015-09-25 DIAGNOSIS — N952 Postmenopausal atrophic vaginitis: Secondary | ICD-10-CM

## 2015-09-25 DIAGNOSIS — N8110 Cystocele, unspecified: Secondary | ICD-10-CM | POA: Diagnosis not present

## 2015-09-25 DIAGNOSIS — IMO0002 Reserved for concepts with insufficient information to code with codable children: Secondary | ICD-10-CM

## 2015-09-25 DIAGNOSIS — IMO0001 Reserved for inherently not codable concepts without codable children: Secondary | ICD-10-CM

## 2015-09-25 NOTE — Progress Notes (Signed)
09/25/2015 3:08 PM   Nicole Clayton 1928/05/31 [id number]  Referring provider: Ezequiel Kayser, MD [facility] [place], [place] [address]  Chief Complaint  Patient presents with  . Vaginal Atrophy    2 week follow up    HPI: Patient is a 82 -year-old Caucasian female who presents today for a two week follow up for recurrent UTI's and vaginal atrophy.    Background history Patient was referred to [place] by, Dr. Raechel Ache, for recurrent urinary tract infections.  Patient states that she has had an urinary tract infection every week this summer.  Her symptoms with a urinary tract infection consist of suprapubic pain, dysuria and a bad odor to her urine.  She denies gross hematuria, abdominal pain or flank pain.  She has not had any recent fevers, chills, nausea or vomiting.   She does not have a history of nephrolithiasis or GU trauma.   She does have a history of GU surgery (pelvic sling).   Reviewing her records,  she has had two documented infections over the last year.    + Proteus- pan sensitive on 11/06/2014  + E. Coli- variable sensitivities on 07/14/2015  She is post menopausal.  She denies constipation and/or diarrhea.  She does engage in good perineal hygiene. She does not take tub baths.  She has not had incontinence.    She had a CT abd/pelvis with contrast due to a history of fallopian tube carcinoma.  No abnormality seen in the kidney or the bladder.  She is drinking 16 oz of water daily.     Today, she is having nocturia.  She denies any current dysuria.  She is not having gross hematuria or suprapubic pain.  She is not having fevers, chills, nausea or vomiting.    She feels the vaginal estrogen cream is helping with the vaginal burning.  She has not had vaginal discharge.  She has the pessary in place.  PMH: Past Medical History:  Diagnosis Date  . Bladder infection, chronic 10/19/2011  . Cancer (HCC)    Ovarian   . Carcinoma of fallopian tube ([place])  10/05/2009   Overview:  Overview:  Overview:  Drs. Claiborne Rigg and Delorise Shiner Choksi Overview:  Drs. Claiborne Rigg and [employer]   . GERD (gastroesophageal reflux disease)   . MI (mitral incompetence) 10/27/2014   Overview:  MODERATE   . Mitral valve prolapse   . OAB (overactive bladder)   . Tachycardia     Surgical History: Past Surgical History:  Procedure Laterality Date  . ABDOMINAL HYSTERECTOMY    . CHOLECYSTECTOMY    . LAPAROSCOPIC BILATERAL SALPINGO OOPHERECTOMY  2011    Home Medications:    Medication List       Accurate as of 09/25/15  3:08 PM. Always use your most recent med list.          acetaminophen 500 MG tablet Commonly known as:  TYLENOL Take 1 tablet (500 mg total) by mouth every 6 (six) hours as needed.   aspirin EC 81 MG tablet Take 81 mg by mouth.   cetirizine 10 MG tablet Commonly known as:  ZYRTEC Take by mouth.   ciprofloxacin 500 MG tablet Commonly known as:  CIPRO Take 1 tablet (500 mg total) by mouth every 12 (twelve) hours.   conjugated estrogens vaginal cream Commonly known as:  PREMARIN Place 1 Applicatorful vaginally daily. Apply 0.5mg  (pea-sized amount)  just inside the vaginal introitus with a finger-tip every night  for two weeks and then Monday, Wednesday and Friday nights.   Cranberry 1000 MG Caps Take by mouth.   docusate sodium 100 MG capsule Commonly known as:  COLACE Take 100 mg by mouth.   ertapenem 1 g in sodium chloride 0.9 % 50 mL Inject 1 g into the vein daily. For 7 days   estradiol 0.1 MG/GM vaginal cream Commonly known as:  ESTRACE VAGINAL Apply 0.5mg  (pea-sized amount)  just inside the vaginal introitus with a finger-tip every night for two weeks and then Monday, Wednesday and Friday nights.   fluconazole 150 MG tablet Commonly known as:  DIFLUCAN Take 1 tablet (150 mg total) by mouth once. Can take additional dose three days later if symptoms persist   fluticasone 50 MCG/ACT nasal spray Commonly known as:   FLONASE Place into the nose.   gabapentin 600 MG tablet Commonly known as:  NEURONTIN Take 1 tablet (600 mg total) by mouth 3 (three) times daily.   imipenem-cilastatin 500 mg in sodium chloride 0.9 % 100 mL Inject 500 mg into the vein every 8 (eight) hours. Use port cath for injections. Start 07/21/15 at 2:00 pm, end 07/29/15 at 10 pm   loratadine 10 MG tablet Commonly known as:  CLARITIN Take by mouth.   metoprolol succinate 50 MG 24 hr tablet Commonly known as:  TOPROL-XL Take 50 mg by mouth.   metoprolol succinate 50 MG 24 hr tablet Commonly known as:  TOPROL-XL   Niraparib Tosylate 100 MG Caps Commonly known as:  ZEJULA Take 300 mg by mouth once. Take 3 pills at bed time; IF NEEDED take a nausea pill 30 mins prior.   nitrofurantoin (macrocrystal-monohydrate) 100 MG capsule Commonly known as:  MACROBID Take 1 capsule (100 mg total) by mouth 2 (two) times daily.   nystatin ointment Commonly known as:  MYCOSTATIN Apply 1 application topically 2 (two) times daily.   ondansetron 4 MG tablet Commonly known as:  ZOFRAN TAKE (1) TABLET BY MOUTH EVERY 8 HOURS AS NEEDED FOR NAUSEA/VOMITING   pantoprazole 40 MG tablet Commonly known as:  PROTONIX Take 40 mg by mouth.   triamcinolone ointment 0.1 % Commonly known as:  KENALOG Apply 1 application topically 2 (two) times daily.   vitamin C 100 MG tablet Take 100 mg by mouth daily.   Vitamin D3 2000 units capsule Take by mouth.       Allergies:  Allergies  Allergen Reactions  . Benadryl [Diphenhydramine] Shortness Of Breath    Sob, rash, swelling  . Tamsulosin Swelling  . Alendronate Sodium     Other reaction(s): Other (See Comments) Dysphagia  . Duloxetine Hcl Diarrhea  . Risedronate Sodium Rash    Aching, dysphagia  . Nitrofurantoin Other (See Comments)  . Lovastatin     Other reaction(s): Other (See Comments) GI upset  . Sulfa Antibiotics Rash    Family History: Family History  Problem Relation Age  of Onset  . Ovarian cancer Other   . Breast cancer Neg Hx   . Colon cancer Neg Hx   . Diabetes Neg Hx   . Heart disease Neg Hx     Social History:  reports that she has never smoked. She has never used smokeless tobacco. She reports that she does not drink alcohol or use drugs.  ROS: UROLOGY Frequent Urination?: No Hard to postpone urination?: No Burning/pain with urination?: Yes Get up at night to urinate?: Yes Leakage of urine?: No Urine stream starts and stops?: No Trouble starting stream?: No  Do you have to strain to urinate?: No Blood in urine?: No Urinary tract infection?: No Sexually transmitted disease?: No Injury to kidneys or bladder?: No Painful intercourse?: No Weak stream?: No Currently pregnant?: No Vaginal bleeding?: No Last menstrual period?: n  Gastrointestinal Nausea?: No Vomiting?: No Indigestion/heartburn?: No Diarrhea?: No Constipation?: No  Constitutional Fever: No Night sweats?: No Weight loss?: No Fatigue?: No  Skin Skin rash/lesions?: No Itching?: No  Eyes Blurred vision?: No Double vision?: No  Ears/Nose/Throat Sore throat?: No Sinus problems?: No  Hematologic/Lymphatic Swollen glands?: No Easy bruising?: No  Cardiovascular Leg swelling?: No Chest pain?: No  Respiratory Cough?: No Shortness of breath?: No  Endocrine Excessive thirst?: No  Musculoskeletal Back pain?: No Joint pain?: No  Neurological Headaches?: No Dizziness?: No  Psychologic Depression?: No Anxiety?: No  Physical Exam: BP 135/77   Pulse 71   Ht 5' (1.524 m)   Wt 138 lb 8 oz (62.8 kg)   BMI 27.05 kg/m   Constitutional: Well nourished. Alert and oriented, No acute distress. HEENT: [place] AT, moist mucus membranes. Trachea midline, no masses. Cardiovascular: No clubbing, cyanosis, or edema. Respiratory: Normal respiratory effort, no increased work of breathing. GI: Abdomen is soft, non tender, non distended, no abdominal masses. Liver and  spleen not palpable.  No hernias appreciated.  Stool sample for occult testing is not indicated.   GU: No CVA tenderness.  No bladder fullness or masses.  Atrophic external genitalia, normal pubic hair distribution, no lesions.  Normal urethral meatus, no lesions, no prolapse, no discharge.   Urethral caruncle is noted.  Pessary in place,   No bladder fullness, tenderness or masses. Pale vagina mucosa, poor estrogen effect, no discharge, no lesions.   Anus and perineum are without rashes or lesions.    Skin: No rashes, bruises or suspicious lesions. Lymph: No cervical or inguinal adenopathy. Neurologic: Grossly intact, no focal deficits, moving all 4 extremities. Psychiatric: Normal mood and affect.  Laboratory Data: Lab Results  Component Value Date   WBC 4.8 09/07/2015   HGB 12.2 09/07/2015   HCT 36.9 09/07/2015   MCV 89.8 09/07/2015   PLT 403 09/07/2015    Lab Results  Component Value Date   CREATININE 0.98 09/07/2015    Lab Results  Component Value Date   TSH 1.99 02/03/2014    Lab Results  Component Value Date   AST 28 09/07/2015   Lab Results  Component Value Date   ALT 15 09/07/2015    Pertinent Imaging: CLINICAL DATA:  Restaging fallopian tube carcinoma. Initial diagnosis 2011.  EXAM: CT ABDOMEN AND PELVIS WITH CONTRAST  TECHNIQUE: Multidetector CT imaging of the abdomen and pelvis was performed using the standard protocol following bolus administration of intravenous contrast.  CONTRAST:  48mL ISOVUE-300 IOPAMIDOL (ISOVUE-300) INJECTION 61%  COMPARISON:  01/11/2015  FINDINGS: Lower chest: The lung bases are clear of acute process. No worrisome pulmonary nodules or pleural effusion. The heart is within normal limits in size for age. No pericardial effusion. Stable large hiatal hernia.  Hepatobiliary: No focal hepatic lesions or intrahepatic biliary dilatation. The hemangioma involving the right hepatic lobe near the kidney is not as well seen  on today's examination.  Pancreas: Stable mild common bile duct dilatation status post cholecystectomy. No pancreatic mass, inflammation or ductal dilatation.  Spleen: Stable small spleen.  No focal lesions.  Adrenals/Urinary Tract: The adrenal glands and kidneys are unremarkable and stable.  Stomach/Bowel: Most of the stomach is up in the chest. The duodenum, small bowel  and colon are unremarkable. No inflammatory changes, mass lesions or obstructive findings. Stable diverticulosis. No findings for acute diverticulitis. The terminal ileum is normal.  Vascular/Lymphatic: No mesenteric or retroperitoneal mass or adenopathy. Small scattered lymph nodes are stable. The aorta is normal in caliber. Stable atherosclerotic calcifications. The major venous structures are patent.  Reproductive: Status post hysterectomy.  Other: No pelvic mass or lymphadenopathy. The bladder appears normal. A pessary ring is noted in the vagina.  Interval decrease in size of the necrotic adenopathy in the right inguinal area. The more superior and lateral nodal lesion measures 27 x 19 mm and previously measured 32 x 31 mm. The more inferior and medial lesion measures 25 x 24 mm and previously measured 48 x 35 mm. No left-sided inguinal adenopathy.  Musculoskeletal: No significant bony findings.  IMPRESSION: 1. Interval decrease in size of the necrotic appearing right inguinal adenopathy. 2. No abdominal/pelvic lymphadenopathy or findings for peritoneal surface disease. 3. Status post cholecystectomy with stable mild associated biliary dilatation. 4. Stable large hiatal hernia.   Electronically Signed   By: Marijo Sanes M.D.   On: 08/09/2015 12:01      Assessment & Plan:    1. Recurrent UTI's  - reviewed UTI prevention  - start Macrobid 100 mg daily for suppressive therapy  - asked patient to present to our office with symptoms of an UTI                              2.  Vaginal atrophy  - hold vaginal estrogen cream for now until she can speak with her oncologist, as she feels it is helping  3. Cystocele  - managed by gynecology  - pessary in place  Return in about 3 months (around 12/26/2015) for exam and symptom recheck.  These notes generated with voice recognition software. I apologize for typographical errors.  Zara Council, [facility] [address] [place], [place] [address] [phone]

## 2015-10-05 ENCOUNTER — Other Ambulatory Visit: Payer: Self-pay | Admitting: *Deleted

## 2015-10-05 ENCOUNTER — Inpatient Hospital Stay: Payer: Medicare HMO

## 2015-10-05 ENCOUNTER — Inpatient Hospital Stay (HOSPITAL_BASED_OUTPATIENT_CLINIC_OR_DEPARTMENT_OTHER): Payer: Medicare HMO | Admitting: Internal Medicine

## 2015-10-05 VITALS — BP 121/86 | HR 73 | Temp 96.5°F | Resp 18 | Wt 137.1 lb

## 2015-10-05 DIAGNOSIS — C5700 Malignant neoplasm of unspecified fallopian tube: Secondary | ICD-10-CM

## 2015-10-05 DIAGNOSIS — Z9221 Personal history of antineoplastic chemotherapy: Secondary | ICD-10-CM

## 2015-10-05 DIAGNOSIS — C774 Secondary and unspecified malignant neoplasm of inguinal and lower limb lymph nodes: Secondary | ICD-10-CM | POA: Diagnosis not present

## 2015-10-05 DIAGNOSIS — K219 Gastro-esophageal reflux disease without esophagitis: Secondary | ICD-10-CM

## 2015-10-05 DIAGNOSIS — Z7982 Long term (current) use of aspirin: Secondary | ICD-10-CM

## 2015-10-05 DIAGNOSIS — R110 Nausea: Secondary | ICD-10-CM

## 2015-10-05 DIAGNOSIS — C5701 Malignant neoplasm of right fallopian tube: Secondary | ICD-10-CM | POA: Diagnosis not present

## 2015-10-05 DIAGNOSIS — Z95828 Presence of other vascular implants and grafts: Secondary | ICD-10-CM

## 2015-10-05 DIAGNOSIS — Z86718 Personal history of other venous thrombosis and embolism: Secondary | ICD-10-CM

## 2015-10-05 DIAGNOSIS — R42 Dizziness and giddiness: Secondary | ICD-10-CM

## 2015-10-05 DIAGNOSIS — R5383 Other fatigue: Secondary | ICD-10-CM

## 2015-10-05 DIAGNOSIS — R590 Localized enlarged lymph nodes: Secondary | ICD-10-CM

## 2015-10-05 DIAGNOSIS — N3281 Overactive bladder: Secondary | ICD-10-CM

## 2015-10-05 DIAGNOSIS — Z923 Personal history of irradiation: Secondary | ICD-10-CM

## 2015-10-05 DIAGNOSIS — I341 Nonrheumatic mitral (valve) prolapse: Secondary | ICD-10-CM

## 2015-10-05 DIAGNOSIS — M25552 Pain in left hip: Secondary | ICD-10-CM

## 2015-10-05 LAB — COMPREHENSIVE METABOLIC PANEL
ALBUMIN: 3.8 g/dL (ref 3.5–5.0)
ALK PHOS: 59 U/L (ref 38–126)
ALT: 12 U/L — ABNORMAL LOW (ref 14–54)
ANION GAP: 10 (ref 5–15)
AST: 26 U/L (ref 15–41)
BUN: 17 mg/dL (ref 6–20)
CO2: 27 mmol/L (ref 22–32)
Calcium: 8.9 mg/dL (ref 8.9–10.3)
Chloride: 104 mmol/L (ref 101–111)
Creatinine, Ser: 0.82 mg/dL (ref 0.44–1.00)
GFR calc Af Amer: >60 mL/min (ref >60)
GFR calc non Af Amer: >60 mL/min (ref >60)
GLUCOSE: 90 mg/dL (ref 65–99)
POTASSIUM: 3.8 mmol/L (ref 3.5–5.1)
SODIUM: 141 mmol/L (ref 135–145)
Total Bilirubin: 0.4 mg/dL (ref 0.3–1.2)
Total Protein: 7.5 g/dL (ref 6.5–8.1)

## 2015-10-05 LAB — CBC WITH DIFFERENTIAL/PLATELET
BASOS ABS: 0.1 10*3/uL (ref 0–0.1)
BASOS PCT: 2 %
EOS ABS: 0.1 10*3/uL (ref 0–0.7)
Eosinophils Relative: 2 %
HCT: 37.9 % (ref 35.0–47.0)
HEMOGLOBIN: 12.9 g/dL (ref 12.0–16.0)
Lymphocytes Relative: 33 %
Lymphs Abs: 1.3 10*3/uL (ref 1.0–3.6)
MCH: 30.7 pg (ref 26.0–34.0)
MCHC: 33.9 g/dL (ref 32.0–36.0)
MCV: 90.5 fL (ref 80.0–100.0)
MONOS PCT: 14 %
Monocytes Absolute: 0.5 10*3/uL (ref 0.2–0.9)
NEUTROS ABS: 1.9 10*3/uL (ref 1.4–6.5)
NEUTROS PCT: 49 %
Platelets: 400 10*3/uL (ref 150–440)
RBC: 4.19 MIL/uL (ref 3.80–5.20)
RDW: 16.2 % — AB (ref 11.5–14.5)
WBC: 3.8 10*3/uL (ref 3.6–11.0)

## 2015-10-05 MED ORDER — SODIUM CHLORIDE 0.9% FLUSH
10.0000 mL | INTRAVENOUS | Status: DC | PRN
  Administered 2015-10-05: 10 mL via INTRAVENOUS
  Filled 2015-10-05: qty 10

## 2015-10-05 MED ORDER — HEPARIN SOD (PORK) LOCK FLUSH 100 UNIT/ML IV SOLN
500.0000 [IU] | Freq: Once | INTRAVENOUS | Status: AC
  Administered 2015-10-05: 500 [IU] via INTRAVENOUS

## 2015-10-05 NOTE — Assessment & Plan Note (Signed)
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 5th July 2017 CT scan shows- improved inguinal adenopathy. STOP/declines Avastin sec to concerns of SEs;   # Trial of Zejula 1pill/day- and if tolerating well- ramp up to 2 pills a day.   #  Right R swelling-chronic ? Sec to LN- if worse plan [place].   # The above plan was discussed with the patient and her daughter in detail.   # / follow up in 4 weeks/ cbc/cmp/ca- 125. Needs port flush now;  every 8 weeks.

## 2015-10-05 NOTE — Progress Notes (Signed)
[facility] OFFICE PROGRESS NOTE  Patient Care Team: Ezequiel Kayser, [name]  No matching staging information was found for the patient.   Oncology History   . 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  Adequate TRS, no macroscopic residual. IP/IV chemotherapy with DDP and paclitaxel on GOG protocol, chemo and Bev consolidation completed in 03/2011 2. 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 November 14, 2011- Patient was started on carboplatin and gemcitabine 4. Finished 6 cycles of chemotherapy in April of 2014 with carboplatin and gemcitabine. Tolerance was fairly good except for neutropenia and thrombocytopenia. 5.recurrent disease by CT scan February of 2015 6.radiation therapy to pelvis  May of 2015 7.upper extremity  . and deep vein thrombosis associated with port.(June of 2015) patient started on   [place] had port removed and had   thrombectomy September 06, 2013. # CT DEC 2016- progressing disease in the right inguinal area # FEB-TAXOL-AVASTIN;  # FEB 2017- AVASTIN q 3W; July 2017- CT- Improved  Right Inguinal LN; STOP AVASTIN sec to potential concerns of AEs  # AUG 2017 3rd- START ZEJULA       Malignant neoplasm of right fallopian tube ([place])   10/05/2009 Initial Diagnosis    Carcinoma of fallopian tube        INTERVAL HISTORY:  Nicole Clayton 82 y.o.  female pleasant patient above history of Fallopian tube cancer metastatic currently Zejula-State for follow-up. 2 days after taking the medication patient noted to have dizzy spells/extreme fatigue and nausea. She states that her symptoms improved after stopping the medication.   Nodes she has some pain in the left hip. Otherwise no weight loss or abdominal bloating. Otherwise no fever. Chills. No nausea or vomiting. Appetite is fair. She is chronic swelling of right lower extremity- to be due to  her lymph node masses in the right groin.   REVIEW OF SYSTEMS:  A complete 10 point review of system is done which is negative except mentioned above/history of present illness. Complains of hoarseness of voice.  PAST MEDICAL HISTORY :  Past Medical History:  Diagnosis Date  . Bladder infection, chronic 10/19/2011  . Cancer (HCC)    Ovarian   . Carcinoma of fallopian tube ([place]) 10/05/2009   Overview:  Overview:  Overview:  Drs. Claiborne Rigg and Delorise Shiner Choksi Overview:  Drs. Claiborne Rigg and [employer]   . GERD (gastroesophageal reflux disease)   . MI (mitral incompetence) 10/27/2014   Overview:  MODERATE   . Mitral valve prolapse   . OAB (overactive bladder)   . Tachycardia     PAST SURGICAL HISTORY :   Past Surgical History:  Procedure Laterality Date  . ABDOMINAL HYSTERECTOMY    . CHOLECYSTECTOMY    . LAPAROSCOPIC BILATERAL SALPINGO OOPHERECTOMY  2011    FAMILY HISTORY :   Family History  Problem Relation Age of Onset  . Ovarian cancer Other   . Breast cancer Neg Hx   . Colon cancer Neg Hx   . Diabetes Neg Hx   . Heart disease Neg Hx     SOCIAL HISTORY:   Social History  Substance Use Topics  . Smoking status: Never Smoker  . Smokeless tobacco: Never Used  . Alcohol use No    ALLERGIES:  is allergic to benadryl [diphenhydramine]; tamsulosin; alendronate sodium; duloxetine hcl; risedronate sodium; nitrofurantoin; lovastatin; and sulfa antibiotics.  MEDICATIONS:  Current Outpatient  Prescriptions  Medication Sig Dispense Refill  . nitrofurantoin, macrocrystal-monohydrate, (MACROBID) 100 MG capsule Take 100 mg by mouth 2 (two) times daily.    Marland Kitchen acetaminophen (TYLENOL) 500 MG tablet Take 1 tablet (500 mg total) by mouth every 6 (six) hours as needed. 30 tablet 0  . Ascorbic Acid (VITAMIN C) 100 MG tablet Take 100 mg by mouth daily.    Marland Kitchen aspirin EC 81 MG tablet Take 81 mg by mouth.    . cetirizine (ZYRTEC) 10 MG tablet Take by mouth.    . Cholecalciferol (VITAMIN  D3) 2000 UNITS capsule Take by mouth.    . conjugated estrogens (PREMARIN) vaginal cream Place 1 Applicatorful vaginally daily. Apply 0.5mg  (pea-sized amount)  just inside the vaginal introitus with a finger-tip every night for two weeks and then Monday, Wednesday and Friday nights. (Patient not taking: Reported on 09/25/2015) 30 g 12  . Cranberry 1000 MG CAPS Take by mouth.    . docusate sodium (COLACE) 100 MG capsule Take 100 mg by mouth.    . ertapenem 1 g in sodium chloride 0.9 % 50 mL Inject 1 g into the vein daily. For 7 days (Patient not taking: Reported on 09/25/2015) 7 g 0  . estradiol (ESTRACE VAGINAL) 0.1 MG/GM vaginal cream Apply 0.5mg  (pea-sized amount)  just inside the vaginal introitus with a finger-tip every night for two weeks and then Monday, Wednesday and Friday nights. 30 g 12  . fluconazole (DIFLUCAN) 150 MG tablet Take 1 tablet (150 mg total) by mouth once. Can take additional dose three days later if symptoms persist (Patient not taking: Reported on 09/11/2015) 1 tablet 3  . fluticasone (FLONASE) 50 MCG/ACT nasal spray Place into the nose.    . gabapentin (NEURONTIN) 600 MG tablet Take 1 tablet (600 mg total) by mouth 3 (three) times daily. 180 tablet 3  . imipenem-cilastatin 500 mg in sodium chloride 0.9 % 100 mL Inject 500 mg into the vein every 8 (eight) hours. Use port cath for injections. Start 07/21/15 at 2:00 pm, end 07/29/15 at 10 pm (Patient not taking: Reported on 09/11/2015) 500 mg of imipenem 26  . loratadine (CLARITIN) 10 MG tablet Take by mouth.    . metoprolol succinate (TOPROL-XL) 50 MG 24 hr tablet Take 50 mg by mouth.    . metoprolol succinate (TOPROL-XL) 50 MG 24 hr tablet     . Niraparib Tosylate (ZEJULA) 100 MG CAPS Take 300 mg by mouth once. Take 3 pills at bed time; IF NEEDED take a nausea pill 30 mins prior. (Patient not taking: Reported on 09/25/2015) 90 capsule 3  . nystatin ointment (MYCOSTATIN) Apply 1 application topically 2 (two) times daily. (Patient not  taking: Reported on 09/11/2015) 30 g 0  . ondansetron (ZOFRAN) 4 MG tablet TAKE (1) TABLET BY MOUTH EVERY 8 HOURS AS NEEDED FOR NAUSEA/VOMITING 30 tablet 3  . pantoprazole (PROTONIX) 40 MG tablet Take 40 mg by mouth.    . triamcinolone ointment (KENALOG) 0.1 % Apply 1 application topically 2 (two) times daily. (Patient not taking: Reported on 09/25/2015) 30 g 0   No current facility-administered medications for this visit.    Facility-Administered Medications Ordered in Other Visits  Medication Dose Route Frequency Provider Last Rate Last Dose  . diphenhydrAMINE (BENADRYL) injection 50 mg  50 mg Intravenous Once Forest Gleason, MD      . sodium chloride 0.9 % injection 10 mL  10 mL Intravenous PRN Forest Gleason, MD   10 mL at 01/12/15 1116  .  sodium chloride 0.9 % injection 10 mL  10 mL Intravenous PRN Forest Gleason, MD   10 mL at 02/16/15 1025  . sodium chloride flush (NS) 0.9 % injection 10 mL  10 mL Intravenous PRN [name], MD   10 mL at 10/05/15 1138    PHYSICAL EXAMINATION: ECOG PERFORMANCE STATUS: 2 - Symptomatic, <50% confined to bed  BP 121/86 (BP Location: Left Arm, Patient Position: Sitting)   Pulse 73   Temp (!) 96.5 F (35.8 C) (Tympanic)   Resp 18   Wt 137 lb 2 oz (62.2 kg)   BMI 26.78 kg/m   Filed Weights   10/05/15 1032  Weight: 137 lb 2 oz (62.2 kg)    GENERAL: Well-nourished well-developed; Alert, no distress and comfortable.  She is walking herself. Accompanied by daughter. EYES: no pallor or icterus OROPHARYNX: no thrush or ulceration; good dentition  NECK: supple, no masses felt LYMPH:  no palpable lymphadenopathy in the cervical, axillary; Right ingiunal LN ~1-2cm in size.  LUNGS: clear to auscultation and  No wheeze or crackles HEART/CVS: regular rate & rhythm and no murmurs; chronic right lower extremity swelling ABDOMEN:abdomen soft, non-tender and normal bowel sounds Musculoskeletal:no cyanosis of digits and no clubbing  PSYCH: alert & oriented  x 3 with fluent speech NEURO: no focal motor/sensory deficits SKIN:  no rashes or significant lesions  LABORATORY DATA:  I have reviewed the data as listed    Component Value Date/Time   NA 141 10/05/2015 0958   NA 138 06/02/2014 0910   K 3.8 10/05/2015 0958   K 3.7 06/02/2014 0910   CL 104 10/05/2015 0958   CL 107 06/02/2014 0910   CO2 27 10/05/2015 0958   CO2 26 06/02/2014 0910   GLUCOSE 90 10/05/2015 0958   GLUCOSE 106 (H) 06/02/2014 0910   BUN 17 10/05/2015 0958   BUN 17 06/02/2014 0910   CREATININE 0.82 10/05/2015 0958   CREATININE 1.00 06/02/2014 0910   CALCIUM 8.9 10/05/2015 0958   CALCIUM 8.6 (L) 06/02/2014 0910   PROT 7.5 10/05/2015 0958   PROT 6.3 (L) 06/02/2014 0910   ALBUMIN 3.8 10/05/2015 0958   ALBUMIN 3.4 (L) 06/02/2014 0910   AST 26 10/05/2015 0958   AST 23 06/02/2014 0910   ALT 12 (L) 10/05/2015 0958   ALT 10 (L) 06/02/2014 0910   ALKPHOS 59 10/05/2015 0958   ALKPHOS 46 06/02/2014 0910   BILITOT 0.4 10/05/2015 0958   BILITOT 0.5 06/02/2014 0910   GFRNONAA >60 10/05/2015 0958   GFRNONAA 52 (L) 06/02/2014 0910   GFRAA >60 10/05/2015 0958   GFRAA >60 06/02/2014 0910    No results found for: SPEP, UPEP  Lab Results  Component Value Date   WBC 3.8 10/05/2015   NEUTROABS 1.9 10/05/2015   HGB 12.9 10/05/2015   HCT 37.9 10/05/2015   MCV 90.5 10/05/2015   PLT 400 10/05/2015      Chemistry      Component Value Date/Time   NA 141 10/05/2015 0958   NA 138 06/02/2014 0910   K 3.8 10/05/2015 0958   K 3.7 06/02/2014 0910   CL 104 10/05/2015 0958   CL 107 06/02/2014 0910   CO2 27 10/05/2015 0958   CO2 26 06/02/2014 0910   BUN 17 10/05/2015 0958   BUN 17 06/02/2014 0910   CREATININE 0.82 10/05/2015 0958   CREATININE 1.00 06/02/2014 0910      Component Value Date/Time   CALCIUM 8.9 10/05/2015 0958   CALCIUM 8.6 (  L) 06/02/2014 0910   ALKPHOS 59 10/05/2015 0958   ALKPHOS 46 06/02/2014 0910   AST 26 10/05/2015 0958   AST 23 06/02/2014 0910    ALT 12 (L) 10/05/2015 0958   ALT 10 (L) 06/02/2014 0910   BILITOT 0.4 10/05/2015 0958   BILITOT 0.5 06/02/2014 0910       RADIOGRAPHIC STUDIES: I have personally reviewed the radiological images as listed and agreed with the findings in the report. No results found.   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 5th July 2017 CT scan shows- improved inguinal adenopathy. STOP/declines Avastin sec to concerns of SEs;   # Trial of Zejula 1pill/day- and if tolerating well- ramp up to 2 pills a day.   #  Right R swelling-chronic ? Sec to LN- if worse plan [place].   # The above plan was discussed with the patient and her daughter in detail.   # / follow up in 4 weeks/ cbc/cmp/ca- 125. Needs port flush now;  every 8 weeks.    No orders of the defined types were placed in this encounter.  All questions were answered. The patient knows to call the clinic with any problems, questions or concerns.      [name], MD 10/05/2015 4:47 PM

## 2015-10-06 LAB — CA 125: CA 125: 24.8 U/mL (ref 0.0–38.1)

## 2015-11-02 ENCOUNTER — Ambulatory Visit (INDEPENDENT_AMBULATORY_CARE_PROVIDER_SITE_OTHER): Payer: Medicare HMO | Admitting: Obstetrics and Gynecology

## 2015-11-02 VITALS — BP 121/70 | HR 68 | Ht 60.0 in | Wt 136.3 lb

## 2015-11-02 DIAGNOSIS — N952 Postmenopausal atrophic vaginitis: Secondary | ICD-10-CM

## 2015-11-02 DIAGNOSIS — Z4689 Encounter for fitting and adjustment of other specified devices: Secondary | ICD-10-CM | POA: Diagnosis not present

## 2015-11-02 DIAGNOSIS — C5700 Malignant neoplasm of unspecified fallopian tube: Secondary | ICD-10-CM

## 2015-11-02 DIAGNOSIS — Z90710 Acquired absence of both cervix and uterus: Secondary | ICD-10-CM | POA: Diagnosis not present

## 2015-11-02 DIAGNOSIS — Z90721 Acquired absence of ovaries, unilateral: Secondary | ICD-10-CM

## 2015-11-02 DIAGNOSIS — N8110 Cystocele, unspecified: Secondary | ICD-10-CM | POA: Diagnosis not present

## 2015-11-02 DIAGNOSIS — IMO0002 Reserved for concepts with insufficient information to code with codable children: Secondary | ICD-10-CM

## 2015-11-02 DIAGNOSIS — IMO0001 Reserved for inherently not codable concepts without codable children: Secondary | ICD-10-CM

## 2015-11-02 MED ORDER — TRIAMCINOLONE ACETONIDE 0.1 % EX OINT: 1.0000 "application " | TOPICAL_OINTMENT | Freq: Two times a day (BID) | CUTANEOUS | 1 refills | Status: DC

## 2015-11-02 NOTE — Patient Instructions (Signed)
1.  Return in 3 months for pessary maintenance 

## 2015-11-02 NOTE — Progress Notes (Signed)
Chief complaint: 1. Pessary maintenance (last visit 08/01/2015) 2.  History of  multidrug resistant UTI   Here for 3 month follow-up.  Past medical history, past surgical history, problem list, medications, and allergies are reviewed  OBJECTIVE: BP 121/70   Pulse 68   Ht 5' (1.524 m)   Wt 136 lb 4.8 oz (61.8 kg)   BMI 26.62 kg/m  ABDOMEN: Soft, non distended; Non tender. No Organomegaly.  shoddy adenopathy today with several nodes  Palpable measuring 2 x 3 cm PELVIC External Genitalia: urethral caruncle BUS: Normal  Vagina: moderate atrophy, vaginal cuff intact Cervix: surgically absent Uterus: surgically absent Adnexa: no palpable masses RV: Normal external exam Bladder: Nontender  ASSESSMENT: 1. Cystocele, grade 2, stable 2. Pessary maintenance, completed, with pessary being removed, cleaned, and reinserted 3. Incomplete bladder emptying, improved 4. Vaginal atrophy, stable 5. 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 2. Return in 3 months for follow-up 3. No intravaginal medicattions are being used.  A total of 15 minutes were spent face-to-face with the patient during this encounter and over half of that time dealt with counseling and coordination of care.  Brayton Mars, MD  Note: This dictation was prepared with Dragon dictation along with smaller phrase technology. Any transcriptional errors that result from this process are unintentional.

## 2015-11-03 ENCOUNTER — Encounter: Payer: Self-pay | Admitting: Internal Medicine

## 2015-11-03 ENCOUNTER — Inpatient Hospital Stay: Payer: Medicare HMO

## 2015-11-03 ENCOUNTER — Inpatient Hospital Stay: Payer: Medicare HMO | Attending: Internal Medicine | Admitting: Internal Medicine

## 2015-11-03 VITALS — BP 114/76 | HR 73 | Temp 96.0°F | Resp 16 | Ht 60.0 in | Wt 136.8 lb

## 2015-11-03 DIAGNOSIS — R490 Dysphonia: Secondary | ICD-10-CM | POA: Diagnosis not present

## 2015-11-03 DIAGNOSIS — C774 Secondary and unspecified malignant neoplasm of inguinal and lower limb lymph nodes: Secondary | ICD-10-CM | POA: Insufficient documentation

## 2015-11-03 DIAGNOSIS — I341 Nonrheumatic mitral (valve) prolapse: Secondary | ICD-10-CM | POA: Diagnosis not present

## 2015-11-03 DIAGNOSIS — R600 Localized edema: Secondary | ICD-10-CM | POA: Diagnosis not present

## 2015-11-03 DIAGNOSIS — Z79899 Other long term (current) drug therapy: Secondary | ICD-10-CM | POA: Insufficient documentation

## 2015-11-03 DIAGNOSIS — C5700 Malignant neoplasm of unspecified fallopian tube: Secondary | ICD-10-CM

## 2015-11-03 DIAGNOSIS — N3281 Overactive bladder: Secondary | ICD-10-CM | POA: Diagnosis not present

## 2015-11-03 DIAGNOSIS — Z7982 Long term (current) use of aspirin: Secondary | ICD-10-CM | POA: Insufficient documentation

## 2015-11-03 DIAGNOSIS — K219 Gastro-esophageal reflux disease without esophagitis: Secondary | ICD-10-CM | POA: Diagnosis not present

## 2015-11-03 DIAGNOSIS — Z923 Personal history of irradiation: Secondary | ICD-10-CM | POA: Insufficient documentation

## 2015-11-03 DIAGNOSIS — Z86718 Personal history of other venous thrombosis and embolism: Secondary | ICD-10-CM | POA: Insufficient documentation

## 2015-11-03 DIAGNOSIS — Z9221 Personal history of antineoplastic chemotherapy: Secondary | ICD-10-CM | POA: Diagnosis not present

## 2015-11-03 DIAGNOSIS — C5701 Malignant neoplasm of right fallopian tube: Secondary | ICD-10-CM | POA: Insufficient documentation

## 2015-11-03 LAB — COMPREHENSIVE METABOLIC PANEL
ALK PHOS: 49 U/L (ref 38–126)
ALT: 15 U/L (ref 14–54)
AST: 29 U/L (ref 15–41)
Albumin: 3.6 g/dL (ref 3.5–5.0)
Anion gap: 5 (ref 5–15)
BUN: 21 mg/dL — ABNORMAL HIGH (ref 6–20)
CHLORIDE: 107 mmol/L (ref 101–111)
CO2: 27 mmol/L (ref 22–32)
CREATININE: 0.84 mg/dL (ref 0.44–1.00)
Calcium: 8.7 mg/dL — ABNORMAL LOW (ref 8.9–10.3)
Glucose, Bld: 92 mg/dL (ref 65–99)
Potassium: 3.6 mmol/L (ref 3.5–5.1)
Sodium: 139 mmol/L (ref 135–145)
Total Bilirubin: 0.6 mg/dL (ref 0.3–1.2)
Total Protein: 6.9 g/dL (ref 6.5–8.1)

## 2015-11-03 LAB — CBC WITH DIFFERENTIAL/PLATELET
BASOS ABS: 0 10*3/uL (ref 0–0.1)
Basophils Relative: 1 %
EOS PCT: 2 %
Eosinophils Absolute: 0.1 10*3/uL (ref 0–0.7)
HCT: 34.7 % — ABNORMAL LOW (ref 35.0–47.0)
HEMOGLOBIN: 11.6 g/dL — AB (ref 12.0–16.0)
LYMPHS ABS: 1.3 10*3/uL (ref 1.0–3.6)
LYMPHS PCT: 34 %
MCH: 30.2 pg (ref 26.0–34.0)
MCHC: 33.4 g/dL (ref 32.0–36.0)
MCV: 90.4 fL (ref 80.0–100.0)
Monocytes Absolute: 0.5 10*3/uL (ref 0.2–0.9)
Monocytes Relative: 12 %
NEUTROS ABS: 1.9 10*3/uL (ref 1.4–6.5)
NEUTROS PCT: 51 %
PLATELETS: 410 10*3/uL (ref 150–440)
RBC: 3.84 MIL/uL (ref 3.80–5.20)
RDW: 16.2 % — ABNORMAL HIGH (ref 11.5–14.5)
WBC: 3.8 10*3/uL (ref 3.6–11.0)

## 2015-11-03 NOTE — Progress Notes (Signed)
[facility] OFFICE PROGRESS NOTE  Patient Care Team: Ezequiel Kayser, [name]  No matching staging information was found for the patient.   Oncology History   . 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  Adequate TRS, no macroscopic residual. IP/IV chemotherapy with DDP and paclitaxel on GOG protocol, chemo and Bev consolidation completed in 03/2011 2. 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 November 14, 2011- Patient was started on carboplatin and gemcitabine 4. Finished 6 cycles of chemotherapy in April of 2014 with carboplatin and gemcitabine. Tolerance was fairly good except for neutropenia and thrombocytopenia. 5.recurrent disease by CT scan February of 2015  # .radiation therapy to pelvis  May of 2015  7.upper extremity  . and deep vein thrombosis associated with port.(June of 2015) patient started on   [place] had port removed and had   thrombectomy September 06, 2013. # CT DEC 2016- progressing disease in the right inguinal area # FEB-TAXOL-AVASTIN;  # FEB 2017- AVASTIN q 3W; July 2017- CT- Improved  Right Inguinal LN; STOP AVASTIN sec to potential concerns of AEs  # AUG 2017 3rd- START ZEJULA- declines sec to intol  #        Malignant neoplasm of right fallopian tube ([place])   10/05/2009 Initial Diagnosis    Carcinoma of fallopian tube        INTERVAL HISTORY:  Nicole Clayton 82 y.o.  female pleasant patient above history of Fallopian tube cancer metastatic currently Zejula-Is here for follow-up.   Just the day she started taking her oral medication Zejula- she noted to have have dizzy spells/extreme fatigue and nausea. She took it for 3 days and then stopped . She states that her symptoms improved after stopping the medication.   Otherwise no weight loss or abdominal bloating. Otherwise no fever. Chills. No nausea or vomiting. Appetite is  fair. She is chronic swelling of right lower extremity- to be due to her lymph node masses in the right groin.   REVIEW OF SYSTEMS:  A complete 10 point review of system is done which is negative except mentioned above/history of present illness. Complains of hoarseness of voice.  PAST MEDICAL HISTORY :  Past Medical History:  Diagnosis Date  . Bladder infection, chronic 10/19/2011  . Cancer (HCC)    Ovarian   . Carcinoma of fallopian tube ([place]) 10/05/2009   Overview:  Overview:  Overview:  Drs. Claiborne Rigg and Delorise Shiner Choksi Overview:  Drs. Claiborne Rigg and [employer]   . GERD (gastroesophageal reflux disease)   . MI (mitral incompetence) 10/27/2014   Overview:  MODERATE   . Mitral valve prolapse   . OAB (overactive bladder)   . Tachycardia     PAST SURGICAL HISTORY :   Past Surgical History:  Procedure Laterality Date  . ABDOMINAL HYSTERECTOMY    . CHOLECYSTECTOMY    . LAPAROSCOPIC BILATERAL SALPINGO OOPHERECTOMY  2011    FAMILY HISTORY :   Family History  Problem Relation Age of Onset  . Ovarian cancer Other   . Breast cancer Neg Hx   . Colon cancer Neg Hx   . Diabetes Neg Hx   . Heart disease Neg Hx     SOCIAL HISTORY:   Social History  Substance Use Topics  . Smoking status: Never Smoker  . Smokeless tobacco: Never Used  . Alcohol use No    ALLERGIES:  is allergic to  benadryl [diphenhydramine]; tamsulosin; alendronate sodium; duloxetine hcl; risedronate sodium; nitrofurantoin; lovastatin; and sulfa antibiotics.  MEDICATIONS:  Current Outpatient Prescriptions  Medication Sig Dispense Refill  . acetaminophen (TYLENOL) 500 MG tablet Take 1 tablet (500 mg total) by mouth every 6 (six) hours as needed. 30 tablet 0  . Ascorbic Acid (VITAMIN C) 100 MG tablet Take 100 mg by mouth daily.    Marland Kitchen aspirin EC 81 MG tablet Take 81 mg by mouth.    . cetirizine (ZYRTEC) 10 MG tablet Take by mouth.    . Cholecalciferol (VITAMIN D3) 2000 UNITS capsule Take by mouth.    .  docusate sodium (COLACE) 100 MG capsule Take 100 mg by mouth.    . fluticasone (FLONASE) 50 MCG/ACT nasal spray Place into the nose.    . gabapentin (NEURONTIN) 600 MG tablet Take 1 tablet (600 mg total) by mouth 3 (three) times daily. 180 tablet 3  . loratadine (CLARITIN) 10 MG tablet Take by mouth.    . metoprolol succinate (TOPROL-XL) 50 MG 24 hr tablet     . nitrofurantoin, macrocrystal-monohydrate, (MACROBID) 100 MG capsule Take 100 mg by mouth 2 (two) times daily.    Marland Kitchen nystatin ointment (MYCOSTATIN) Apply 1 application topically 2 (two) times daily. 30 g 0  . ondansetron (ZOFRAN) 4 MG tablet TAKE (1) TABLET BY MOUTH EVERY 8 HOURS AS NEEDED FOR NAUSEA/VOMITING 30 tablet 3  . pantoprazole (PROTONIX) 40 MG tablet Take 40 mg by mouth.    . triamcinolone ointment (KENALOG) 0.1 % Apply 1 application topically 2 (two) times daily. 30 g 1  . metoprolol succinate (TOPROL-XL) 50 MG 24 hr tablet Take 50 mg by mouth.     No current facility-administered medications for this visit.    Facility-Administered Medications Ordered in Other Visits  Medication Dose Route Frequency Provider Last Rate Last Dose  . diphenhydrAMINE (BENADRYL) injection 50 mg  50 mg Intravenous Once Forest Gleason, MD      . sodium chloride 0.9 % injection 10 mL  10 mL Intravenous PRN Forest Gleason, MD   10 mL at 01/12/15 1116  . sodium chloride 0.9 % injection 10 mL  10 mL Intravenous PRN Forest Gleason, MD   10 mL at 02/16/15 1025    PHYSICAL EXAMINATION: ECOG PERFORMANCE STATUS: 2 - Symptomatic, <50% confined to bed  BP 114/76 (BP Location: Left Arm, Patient Position: Sitting)   Pulse 73   Temp (!) 96 F (35.6 C) (Tympanic)   Resp 16   Ht 5' (1.524 m)   Wt 136 lb 12.8 oz (62.1 kg)   BMI 26.72 kg/m   Filed Weights   11/03/15 1004  Weight: 136 lb 12.8 oz (62.1 kg)    GENERAL: Well-nourished well-developed; Alert, no distress and comfortable.  She is walking herself. Accompanied by daughter. EYES: no pallor or  icterus OROPHARYNX: no thrush or ulceration; good dentition  NECK: supple, no masses felt LYMPH:  no palpable lymphadenopathy in the cervical, axillary; Right ingiunal LN ~2-3cm in size.  LUNGS: clear to auscultation and  No wheeze or crackles HEART/CVS: regular rate & rhythm and no murmurs; chronic right lower extremity swelling ABDOMEN:abdomen soft, non-tender and normal bowel sounds Musculoskeletal:no cyanosis of digits and no clubbing  PSYCH: alert & oriented x 3 with fluent speech NEURO: no focal motor/sensory deficits SKIN:  no rashes or significant lesions  LABORATORY DATA:  I have reviewed the data as listed    Component Value Date/Time   NA 139 11/03/2015 0941   NA  138 06/02/2014 0910   K 3.6 11/03/2015 0941   K 3.7 06/02/2014 0910   CL 107 11/03/2015 0941   CL 107 06/02/2014 0910   CO2 27 11/03/2015 0941   CO2 26 06/02/2014 0910   GLUCOSE 92 11/03/2015 0941   GLUCOSE 106 (H) 06/02/2014 0910   BUN 21 (H) 11/03/2015 0941   BUN 17 06/02/2014 0910   CREATININE 0.84 11/03/2015 0941   CREATININE 1.00 06/02/2014 0910   CALCIUM 8.7 (L) 11/03/2015 0941   CALCIUM 8.6 (L) 06/02/2014 0910   PROT 6.9 11/03/2015 0941   PROT 6.3 (L) 06/02/2014 0910   ALBUMIN 3.6 11/03/2015 0941   ALBUMIN 3.4 (L) 06/02/2014 0910   AST 29 11/03/2015 0941   AST 23 06/02/2014 0910   ALT 15 11/03/2015 0941   ALT 10 (L) 06/02/2014 0910   ALKPHOS 49 11/03/2015 0941   ALKPHOS 46 06/02/2014 0910   BILITOT 0.6 11/03/2015 0941   BILITOT 0.5 06/02/2014 0910   GFRNONAA >60 11/03/2015 0941   GFRNONAA 52 (L) 06/02/2014 0910   GFRAA >60 11/03/2015 0941   GFRAA >60 06/02/2014 0910    No results found for: SPEP, UPEP  Lab Results  Component Value Date   WBC 3.8 11/03/2015   NEUTROABS 1.9 11/03/2015   HGB 11.6 (L) 11/03/2015   HCT 34.7 (L) 11/03/2015   MCV 90.4 11/03/2015   PLT 410 11/03/2015      Chemistry      Component Value Date/Time   NA 139 11/03/2015 0941   NA 138 06/02/2014 0910   K  3.6 11/03/2015 0941   K 3.7 06/02/2014 0910   CL 107 11/03/2015 0941   CL 107 06/02/2014 0910   CO2 27 11/03/2015 0941   CO2 26 06/02/2014 0910   BUN 21 (H) 11/03/2015 0941   BUN 17 06/02/2014 0910   CREATININE 0.84 11/03/2015 0941   CREATININE 1.00 06/02/2014 0910      Component Value Date/Time   CALCIUM 8.7 (L) 11/03/2015 0941   CALCIUM 8.6 (L) 06/02/2014 0910   ALKPHOS 49 11/03/2015 0941   ALKPHOS 46 06/02/2014 0910   AST 29 11/03/2015 0941   AST 23 06/02/2014 0910   ALT 15 11/03/2015 0941   ALT 10 (L) 06/02/2014 0910   BILITOT 0.6 11/03/2015 0941   BILITOT 0.5 06/02/2014 0910       RADIOGRAPHIC STUDIES: I have personally reviewed the radiological images as listed and agreed with the findings in the report. No results found.   ASSESSMENT & PLAN:  Malignant neoplasm of right fallopian tube [facility]) Metastatic fallopian tube cancer- currently on Avastin since February 2017. 5th July 2017 CT scan shows- improved inguinal adenopathy. STOP/declines Zejula sec to concerns of SEs; Re-Start AVASTIN; get CT scan in next 2 weeks.   #  Right R swelling-chronic ? Sec to LN-  STABLE.    # The above plan was discussed with the patient and her daughter in detail.   # / follow up in 2 weeks/ cbc/cmp/UA/Avastin. CT a/p- prior.    Orders Placed This Encounter  Procedures  . CT ABDOMEN PELVIS W CONTRAST    Standing Status:   Future    Standing Expiration Date:   02/01/2017    Order Specific Question:   Reason for Exam (SYMPTOM  OR DIAGNOSIS REQUIRED)    Answer:   fallopina tube cancer with mets to inggiunal LN    Order Specific Question:   Preferred imaging location?    Answer:   [facility]  .  CBC with Differential    Standing Status:   Future    Standing Expiration Date:   11/02/2016  . Comprehensive metabolic panel    Standing Status:   Future    Standing Expiration Date:   11/02/2016  . Urinalysis complete, with microscopic [facility])    Standing Status:   Future     Standing Expiration Date:   11/02/2016   All questions were answered. The patient knows to call the clinic with any problems, questions or concerns.      [name], MD 11/03/2015 1:27 PM

## 2015-11-03 NOTE — Assessment & Plan Note (Addendum)
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 5th July 2017 CT scan shows- improved inguinal adenopathy. STOP/declines Zejula sec to concerns of SEs; Re-Start AVASTIN; get CT scan in next 2 weeks.   #  Right R swelling-chronic ? Sec to LN-  STABLE.    # The above plan was discussed with the patient and her daughter in detail.   # / follow up in 2 weeks/ cbc/cmp/UA/Avastin. CT a/p- prior.

## 2015-11-04 LAB — CA 125: CA 125: 22.1 U/mL (ref 0.0–38.1)

## 2015-11-10 ENCOUNTER — Ambulatory Visit: Payer: Medicare HMO

## 2015-11-10 ENCOUNTER — Ambulatory Visit
Admission: RE | Admit: 2015-11-10 | Discharge: 2015-11-10 | Disposition: A | Discharge status: Home / Self Care | Payer: Medicare HMO | Source: Ambulatory Visit | Attending: Internal Medicine | Admitting: Internal Medicine

## 2015-11-10 DIAGNOSIS — K449 Diaphragmatic hernia without obstruction or gangrene: Secondary | ICD-10-CM | POA: Diagnosis not present

## 2015-11-10 DIAGNOSIS — N8189 Other female genital prolapse: Secondary | ICD-10-CM | POA: Diagnosis not present

## 2015-11-10 DIAGNOSIS — N8110 Cystocele, unspecified: Secondary | ICD-10-CM | POA: Diagnosis not present

## 2015-11-10 DIAGNOSIS — K5730 Diverticulosis of large intestine without perforation or abscess without bleeding: Secondary | ICD-10-CM | POA: Insufficient documentation

## 2015-11-10 DIAGNOSIS — I700 Atherosclerosis of aorta: Secondary | ICD-10-CM | POA: Diagnosis not present

## 2015-11-10 DIAGNOSIS — C5701 Malignant neoplasm of right fallopian tube: Secondary | ICD-10-CM | POA: Diagnosis present

## 2015-11-10 DIAGNOSIS — I708 Atherosclerosis of other arteries: Secondary | ICD-10-CM | POA: Diagnosis not present

## 2015-11-10 MED ORDER — IOPAMIDOL (ISOVUE-300) INJECTION 61%
100.0000 mL | Freq: Once | INTRAVENOUS | Status: AC | PRN
  Administered 2015-11-10: 100 mL via INTRAVENOUS

## 2015-11-13 ENCOUNTER — Ambulatory Visit: Payer: Medicare HMO

## 2015-11-16 ENCOUNTER — Inpatient Hospital Stay: Payer: Medicare HMO | Attending: Internal Medicine

## 2015-11-16 DIAGNOSIS — C5701 Malignant neoplasm of right fallopian tube: Secondary | ICD-10-CM | POA: Insufficient documentation

## 2015-11-16 DIAGNOSIS — I341 Nonrheumatic mitral (valve) prolapse: Secondary | ICD-10-CM | POA: Diagnosis not present

## 2015-11-16 DIAGNOSIS — R490 Dysphonia: Secondary | ICD-10-CM | POA: Diagnosis not present

## 2015-11-16 DIAGNOSIS — C774 Secondary and unspecified malignant neoplasm of inguinal and lower limb lymph nodes: Secondary | ICD-10-CM | POA: Diagnosis not present

## 2015-11-16 DIAGNOSIS — Z923 Personal history of irradiation: Secondary | ICD-10-CM | POA: Insufficient documentation

## 2015-11-16 DIAGNOSIS — K219 Gastro-esophageal reflux disease without esophagitis: Secondary | ICD-10-CM | POA: Insufficient documentation

## 2015-11-16 DIAGNOSIS — N3281 Overactive bladder: Secondary | ICD-10-CM | POA: Insufficient documentation

## 2015-11-16 DIAGNOSIS — R590 Localized enlarged lymph nodes: Secondary | ICD-10-CM | POA: Diagnosis not present

## 2015-11-16 DIAGNOSIS — Z79899 Other long term (current) drug therapy: Secondary | ICD-10-CM | POA: Insufficient documentation

## 2015-11-16 DIAGNOSIS — Z9221 Personal history of antineoplastic chemotherapy: Secondary | ICD-10-CM | POA: Diagnosis not present

## 2015-11-16 DIAGNOSIS — Z5112 Encounter for antineoplastic immunotherapy: Secondary | ICD-10-CM | POA: Diagnosis present

## 2015-11-16 DIAGNOSIS — Z7982 Long term (current) use of aspirin: Secondary | ICD-10-CM | POA: Insufficient documentation

## 2015-11-16 DIAGNOSIS — R112 Nausea with vomiting, unspecified: Secondary | ICD-10-CM | POA: Insufficient documentation

## 2015-11-16 DIAGNOSIS — R42 Dizziness and giddiness: Secondary | ICD-10-CM | POA: Insufficient documentation

## 2015-11-17 ENCOUNTER — Encounter: Payer: Self-pay | Admitting: Internal Medicine

## 2015-11-17 ENCOUNTER — Inpatient Hospital Stay (HOSPITAL_BASED_OUTPATIENT_CLINIC_OR_DEPARTMENT_OTHER): Payer: Medicare HMO | Admitting: Internal Medicine

## 2015-11-17 ENCOUNTER — Inpatient Hospital Stay: Payer: Medicare HMO

## 2015-11-17 VITALS — BP 132/82 | HR 62 | Temp 95.2°F | Resp 18 | Ht 60.0 in | Wt 137.2 lb

## 2015-11-17 DIAGNOSIS — C774 Secondary and unspecified malignant neoplasm of inguinal and lower limb lymph nodes: Secondary | ICD-10-CM

## 2015-11-17 DIAGNOSIS — R412 Retrograde amnesia: Secondary | ICD-10-CM

## 2015-11-17 DIAGNOSIS — K219 Gastro-esophageal reflux disease without esophagitis: Secondary | ICD-10-CM

## 2015-11-17 DIAGNOSIS — R590 Localized enlarged lymph nodes: Secondary | ICD-10-CM | POA: Diagnosis not present

## 2015-11-17 DIAGNOSIS — Z5112 Encounter for antineoplastic immunotherapy: Secondary | ICD-10-CM | POA: Diagnosis not present

## 2015-11-17 DIAGNOSIS — C5701 Malignant neoplasm of right fallopian tube: Secondary | ICD-10-CM

## 2015-11-17 DIAGNOSIS — R42 Dizziness and giddiness: Secondary | ICD-10-CM

## 2015-11-17 DIAGNOSIS — Z79899 Other long term (current) drug therapy: Secondary | ICD-10-CM

## 2015-11-17 DIAGNOSIS — Z7982 Long term (current) use of aspirin: Secondary | ICD-10-CM

## 2015-11-17 DIAGNOSIS — I341 Nonrheumatic mitral (valve) prolapse: Secondary | ICD-10-CM

## 2015-11-17 DIAGNOSIS — Z9221 Personal history of antineoplastic chemotherapy: Secondary | ICD-10-CM

## 2015-11-17 DIAGNOSIS — Z923 Personal history of irradiation: Secondary | ICD-10-CM

## 2015-11-17 DIAGNOSIS — R490 Dysphonia: Secondary | ICD-10-CM | POA: Diagnosis not present

## 2015-11-17 DIAGNOSIS — N3281 Overactive bladder: Secondary | ICD-10-CM

## 2015-11-17 LAB — COMPREHENSIVE METABOLIC PANEL
ALK PHOS: 47 U/L (ref 38–126)
ALT: 14 U/L (ref 14–54)
AST: 24 U/L (ref 15–41)
Albumin: 3.5 g/dL (ref 3.5–5.0)
Anion gap: 8 (ref 5–15)
BUN: 19 mg/dL (ref 6–20)
CALCIUM: 8.6 mg/dL — AB (ref 8.9–10.3)
CHLORIDE: 103 mmol/L (ref 101–111)
CO2: 26 mmol/L (ref 22–32)
CREATININE: 0.8 mg/dL (ref 0.44–1.00)
GFR calc non Af Amer: >60 mL/min (ref >60)
GLUCOSE: 95 mg/dL (ref 65–99)
Potassium: 3.6 mmol/L (ref 3.5–5.1)
SODIUM: 137 mmol/L (ref 135–145)
Total Bilirubin: 0.6 mg/dL (ref 0.3–1.2)
Total Protein: 6.9 g/dL (ref 6.5–8.1)

## 2015-11-17 LAB — URINALYSIS COMPLETE WITH MICROSCOPIC (ARMC ONLY)
BILIRUBIN URINE: NEGATIVE
Glucose, UA: NEGATIVE mg/dL
Hgb urine dipstick: NEGATIVE
KETONES UR: NEGATIVE mg/dL
Nitrite: NEGATIVE
Protein, ur: NEGATIVE mg/dL
Specific Gravity, Urine: 1.01 (ref 1.005–1.030)
pH: 7 (ref 5.0–8.0)

## 2015-11-17 LAB — CBC WITH DIFFERENTIAL/PLATELET
BASOS ABS: 0 10*3/uL (ref 0–0.1)
Basophils Relative: 1 %
EOS ABS: 0 10*3/uL (ref 0–0.7)
Eosinophils Relative: 1 %
HCT: 34.5 % — ABNORMAL LOW (ref 35.0–47.0)
HEMOGLOBIN: 11.5 g/dL — AB (ref 12.0–16.0)
LYMPHS ABS: 1.3 10*3/uL (ref 1.0–3.6)
LYMPHS PCT: 32 %
MCH: 30.3 pg (ref 26.0–34.0)
MCHC: 33.2 g/dL (ref 32.0–36.0)
MCV: 91.2 fL (ref 80.0–100.0)
Monocytes Absolute: 0.6 10*3/uL (ref 0.2–0.9)
Monocytes Relative: 14 %
NEUTROS PCT: 52 %
Neutro Abs: 2.2 10*3/uL (ref 1.4–6.5)
PLATELETS: 373 10*3/uL (ref 150–440)
RBC: 3.78 MIL/uL — AB (ref 3.80–5.20)
RDW: 15.7 % — ABNORMAL HIGH (ref 11.5–14.5)
WBC: 4.1 10*3/uL (ref 3.6–11.0)

## 2015-11-17 MED ORDER — SODIUM CHLORIDE 0.9 % IJ SOLN
10.0000 mL | INTRAMUSCULAR | Status: DC | PRN
  Administered 2015-11-17: 10 mL
  Filled 2015-11-17: qty 10

## 2015-11-17 MED ORDER — SODIUM CHLORIDE 0.9 % IV SOLN
Freq: Once | INTRAVENOUS | Status: AC
  Administered 2015-11-17: 12:00:00 via INTRAVENOUS
  Filled 2015-11-17: qty 1000

## 2015-11-17 MED ORDER — HEPARIN SOD (PORK) LOCK FLUSH 100 UNIT/ML IV SOLN
500.0000 [IU] | Freq: Once | INTRAVENOUS | Status: AC | PRN
  Administered 2015-11-17: 500 [IU]
  Filled 2015-11-17: qty 5

## 2015-11-17 MED ORDER — SODIUM CHLORIDE 0.9 % IV SOLN
10.0000 mg/kg | Freq: Once | INTRAVENOUS | Status: AC
  Administered 2015-11-17: 625 mg via INTRAVENOUS
  Filled 2015-11-17: qty 16

## 2015-11-17 NOTE — Assessment & Plan Note (Signed)
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 5th July 2017 CT scan shows- improved inguinal adenopathy. STOP/declines Zejula sec to concerns of SEs; OCT- CT scan- STABLE Lymphadenopathy in right groin.  # Re-Start AVASTIN today; labs okay; UA- pending today.   #  Right R swelling-chronic ? Sec to LN-  STABLE.    # The above plan was discussed with the patient and her daughter in detail. # I reviewed the blood work- with the patient in detail; also reviewed the imaging independently [as summarized above]; and with the patient in detail.   # / follow up in 3 weeks/ cbc/cmp/UA/Avastin.

## 2015-11-17 NOTE — Progress Notes (Signed)
Pt had recent fall when trying to get to bathroom after drinking contrast from recent CT.  Hit left hand has wound to skin and knuckle.  Denies going to ER

## 2015-11-17 NOTE — Progress Notes (Signed)
[facility] OFFICE PROGRESS NOTE  Patient Care Team: Ezequiel Kayser, [name]  No matching staging information was found for the patient.   Oncology History   . 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  Adequate TRS, no macroscopic residual. IP/IV chemotherapy with DDP and paclitaxel on GOG protocol, chemo and Bev consolidation completed in 03/2011 2. 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 November 14, 2011- Patient was started on carboplatin and gemcitabine 4. Finished 6 cycles of chemotherapy in April of 2014 with carboplatin and gemcitabine. Tolerance was fairly good except for neutropenia and thrombocytopenia. 5.recurrent disease by CT scan February of 2015  # .radiation therapy to pelvis  May of 2015  7.upper extremity  . and deep vein thrombosis associated with port.(June of 2015) patient started on   [place] had port removed and had   thrombectomy September 06, 2013. # CT DEC 2016- progressing disease in the right inguinal area # FEB-TAXOL-AVASTIN;  # FEB 2017- AVASTIN q 3W; July 2017- CT- Improved  Right Inguinal LN; STOP AVASTIN sec to potential concerns of AEs  # AUG 2017 3rd- START ZEJULA- declines sec to intol  # OCT 7th CT- STABLE RIGHT INGUINAL LN- RE-START AVASTIN q 3W       Malignant neoplasm of right fallopian tube ([place])   10/05/2009 Initial Diagnosis    Carcinoma of fallopian tube        INTERVAL HISTORY:  Nicole Clayton 82 y.o.  female pleasant patient above history of Fallopian tube cancer metastatic currently  Off Zejula Intolerance is here for follow-up/to review the results of the CAT scan.  Patient is dizziness/nausea vomiting improved after stopping Zejula.   Otherwise no weight loss or abdominal bloating. Otherwise no fever.Marland Kitchen Appetite is fair. She is chronic swelling of right lower extremity- to be due to her lymph node  masses in the right groin.   REVIEW OF SYSTEMS:  A complete 10 point review of system is done which is negative except mentioned above/history of present illness. Complains of hoarseness of voice.  PAST MEDICAL HISTORY :  Past Medical History:  Diagnosis Date  . Bladder infection, chronic 10/19/2011  . Cancer (HCC)    Ovarian   . Carcinoma of fallopian tube ([place]) 10/05/2009   Overview:  Overview:  Overview:  Drs. Claiborne Rigg and Delorise Shiner Choksi Overview:  Drs. Claiborne Rigg and [employer]   . GERD (gastroesophageal reflux disease)   . MI (mitral incompetence) 10/27/2014   Overview:  MODERATE   . Mitral valve prolapse   . OAB (overactive bladder)   . Tachycardia     PAST SURGICAL HISTORY :   Past Surgical History:  Procedure Laterality Date  . ABDOMINAL HYSTERECTOMY    . CHOLECYSTECTOMY    . LAPAROSCOPIC BILATERAL SALPINGO OOPHERECTOMY  2011    FAMILY HISTORY :   Family History  Problem Relation Age of Onset  . Ovarian cancer Other   . Breast cancer Neg Hx   . Colon cancer Neg Hx   . Diabetes Neg Hx   . Heart disease Neg Hx     SOCIAL HISTORY:   Social History  Substance Use Topics  . Smoking status: Never Smoker  . Smokeless tobacco: Never Used  . Alcohol use No    ALLERGIES:  is allergic to benadryl [diphenhydramine]; tamsulosin; alendronate sodium; duloxetine hcl; risedronate sodium; nitrofurantoin; lovastatin; and sulfa antibiotics.  MEDICATIONS:  Current Outpatient Prescriptions  Medication Sig Dispense Refill  . acetaminophen (TYLENOL) 500 MG tablet Take 1 tablet (500 mg total) by mouth every 6 (six) hours as needed. 30 tablet 0  . Ascorbic Acid (VITAMIN C) 100 MG tablet Take 100 mg by mouth daily.    Marland Kitchen aspirin EC 81 MG tablet Take 81 mg by mouth.    . cetirizine (ZYRTEC) 10 MG tablet Take by mouth.    . Cholecalciferol (VITAMIN D3) 2000 UNITS capsule Take by mouth.    . docusate sodium (COLACE) 100 MG capsule Take 100 mg by mouth.    . fluticasone  (FLONASE) 50 MCG/ACT nasal spray Place into the nose.    . gabapentin (NEURONTIN) 600 MG tablet Take 1 tablet (600 mg total) by mouth 3 (three) times daily. 180 tablet 3  . loratadine (CLARITIN) 10 MG tablet Take by mouth.    . metoprolol succinate (TOPROL-XL) 50 MG 24 hr tablet     . nitrofurantoin, macrocrystal-monohydrate, (MACROBID) 100 MG capsule Take 100 mg by mouth 2 (two) times daily.    Marland Kitchen nystatin ointment (MYCOSTATIN) Apply 1 application topically 2 (two) times daily. 30 g 0  . ondansetron (ZOFRAN) 4 MG tablet TAKE (1) TABLET BY MOUTH EVERY 8 HOURS AS NEEDED FOR NAUSEA/VOMITING 30 tablet 3  . pantoprazole (PROTONIX) 40 MG tablet Take 40 mg by mouth.    . triamcinolone ointment (KENALOG) 0.1 % Apply 1 application topically 2 (two) times daily. 30 g 1  . metoprolol succinate (TOPROL-XL) 50 MG 24 hr tablet Take 50 mg by mouth.     No current facility-administered medications for this visit.    Facility-Administered Medications Ordered in Other Visits  Medication Dose Route Frequency Provider Last Rate Last Dose  . diphenhydrAMINE (BENADRYL) injection 50 mg  50 mg Intravenous Once Forest Gleason, MD      . sodium chloride 0.9 % injection 10 mL  10 mL Intravenous PRN Forest Gleason, MD   10 mL at 01/12/15 1116  . sodium chloride 0.9 % injection 10 mL  10 mL Intravenous PRN Forest Gleason, MD   10 mL at 02/16/15 1025    PHYSICAL EXAMINATION: ECOG PERFORMANCE STATUS: 2 - Symptomatic, <50% confined to bed  BP 132/82 (BP Location: Left Arm, Patient Position: Sitting)   Pulse 62   Temp (!) 95.2 F (35.1 C) (Tympanic)   Resp 18   Ht 5' (1.524 m)   Wt 137 lb 3.2 oz (62.2 kg)   BMI 26.80 kg/m   Filed Weights   11/17/15 1033  Weight: 137 lb 3.2 oz (62.2 kg)    GENERAL: Well-nourished well-developed; Alert, no distress and comfortable.  She is walking herself. Accompanied by daughter. EYES: no pallor or icterus OROPHARYNX: no thrush or ulceration; good dentition  NECK: supple, no masses  felt LYMPH:  no palpable lymphadenopathy in the cervical, axillary; Right ingiunal LN ~2-3cm in size.  LUNGS: clear to auscultation and  No wheeze or crackles HEART/CVS: regular rate & rhythm and no murmurs; chronic right lower extremity swelling ABDOMEN:abdomen soft, non-tender and normal bowel sounds Musculoskeletal:no cyanosis of digits and no clubbing  PSYCH: alert & oriented x 3 with fluent speech NEURO: no focal motor/sensory deficits SKIN:  no rashes or significant lesions  LABORATORY DATA:  I have reviewed the data as listed    Component Value Date/Time   NA 137 11/17/2015 0941   NA 138 06/02/2014 0910   K 3.6 11/17/2015 0941   K 3.7 06/02/2014 0910  CL 103 11/17/2015 0941   CL 107 06/02/2014 0910   CO2 26 11/17/2015 0941   CO2 26 06/02/2014 0910   GLUCOSE 95 11/17/2015 0941   GLUCOSE 106 (H) 06/02/2014 0910   BUN 19 11/17/2015 0941   BUN 17 06/02/2014 0910   CREATININE 0.80 11/17/2015 0941   CREATININE 1.00 06/02/2014 0910   CALCIUM 8.6 (L) 11/17/2015 0941   CALCIUM 8.6 (L) 06/02/2014 0910   PROT 6.9 11/17/2015 0941   PROT 6.3 (L) 06/02/2014 0910   ALBUMIN 3.5 11/17/2015 0941   ALBUMIN 3.4 (L) 06/02/2014 0910   AST 24 11/17/2015 0941   AST 23 06/02/2014 0910   ALT 14 11/17/2015 0941   ALT 10 (L) 06/02/2014 0910   ALKPHOS 47 11/17/2015 0941   ALKPHOS 46 06/02/2014 0910   BILITOT 0.6 11/17/2015 0941   BILITOT 0.5 06/02/2014 0910   GFRNONAA >60 11/17/2015 0941   GFRNONAA 52 (L) 06/02/2014 0910   GFRAA >60 11/17/2015 0941   GFRAA >60 06/02/2014 0910    No results found for: SPEP, UPEP  Lab Results  Component Value Date   WBC 4.1 11/17/2015   NEUTROABS 2.2 11/17/2015   HGB 11.5 (L) 11/17/2015   HCT 34.5 (L) 11/17/2015   MCV 91.2 11/17/2015   PLT 373 11/17/2015      Chemistry      Component Value Date/Time   NA 137 11/17/2015 0941   NA 138 06/02/2014 0910   K 3.6 11/17/2015 0941   K 3.7 06/02/2014 0910   CL 103 11/17/2015 0941   CL 107  06/02/2014 0910   CO2 26 11/17/2015 0941   CO2 26 06/02/2014 0910   BUN 19 11/17/2015 0941   BUN 17 06/02/2014 0910   CREATININE 0.80 11/17/2015 0941   CREATININE 1.00 06/02/2014 0910      Component Value Date/Time   CALCIUM 8.6 (L) 11/17/2015 0941   CALCIUM 8.6 (L) 06/02/2014 0910   ALKPHOS 47 11/17/2015 0941   ALKPHOS 46 06/02/2014 0910   AST 24 11/17/2015 0941   AST 23 06/02/2014 0910   ALT 14 11/17/2015 0941   ALT 10 (L) 06/02/2014 0910   BILITOT 0.6 11/17/2015 0941   BILITOT 0.5 06/02/2014 0910       RADIOGRAPHIC STUDIES: I have personally reviewed the radiological images as listed and agreed with the findings in the report. No results found.   ASSESSMENT & PLAN:  Malignant neoplasm of right fallopian tube [facility]) Metastatic fallopian tube cancer- currently on Avastin since February 2017. 5th July 2017 CT scan shows- improved inguinal adenopathy. STOP/declines Zejula sec to concerns of SEs; OCT- CT scan- STABLE Lymphadenopathy in right groin.  # Re-Start AVASTIN today; labs okay; UA- pending today.   #  Right R swelling-chronic ? Sec to LN-  STABLE.    # The above plan was discussed with the patient and her daughter in detail. # I reviewed the blood work- with the patient in detail; also reviewed the imaging independently [as summarized above]; and with the patient in detail.   # / follow up in 3 weeks/ cbc/cmp/UA/Avastin.    Orders Placed This Encounter  Procedures  . CBC with Differential    Standing Status:   Standing    Number of Occurrences:   10    Standing Expiration Date:   11/16/2016  . Comprehensive metabolic panel    Standing Status:   Standing    Number of Occurrences:   10    Standing Expiration Date:   11/16/2016  .  Urinalysis complete, with microscopic [facility]    Standing Status:   Future    Number of Occurrences:   1    Standing Expiration Date:   11/16/2016   All questions were answered. The patient knows to call the clinic with any problems,  questions or concerns.      [name], MD 11/18/2015 11:11 AM

## 2015-12-08 ENCOUNTER — Inpatient Hospital Stay: Payer: Medicare HMO | Attending: Internal Medicine | Admitting: Internal Medicine

## 2015-12-08 ENCOUNTER — Inpatient Hospital Stay: Payer: Medicare HMO

## 2015-12-08 VITALS — BP 114/71 | HR 56 | Temp 96.9°F | Resp 18 | Wt 138.0 lb

## 2015-12-08 DIAGNOSIS — Z7982 Long term (current) use of aspirin: Secondary | ICD-10-CM

## 2015-12-08 DIAGNOSIS — Z8543 Personal history of malignant neoplasm of ovary: Secondary | ICD-10-CM | POA: Diagnosis not present

## 2015-12-08 DIAGNOSIS — Z87440 Personal history of urinary (tract) infections: Secondary | ICD-10-CM | POA: Insufficient documentation

## 2015-12-08 DIAGNOSIS — Z90710 Acquired absence of both cervix and uterus: Secondary | ICD-10-CM | POA: Diagnosis not present

## 2015-12-08 DIAGNOSIS — Z90722 Acquired absence of ovaries, bilateral: Secondary | ICD-10-CM | POA: Insufficient documentation

## 2015-12-08 DIAGNOSIS — Z9221 Personal history of antineoplastic chemotherapy: Secondary | ICD-10-CM | POA: Insufficient documentation

## 2015-12-08 DIAGNOSIS — K219 Gastro-esophageal reflux disease without esophagitis: Secondary | ICD-10-CM | POA: Diagnosis not present

## 2015-12-08 DIAGNOSIS — I341 Nonrheumatic mitral (valve) prolapse: Secondary | ICD-10-CM | POA: Diagnosis not present

## 2015-12-08 DIAGNOSIS — N3281 Overactive bladder: Secondary | ICD-10-CM | POA: Insufficient documentation

## 2015-12-08 DIAGNOSIS — R590 Localized enlarged lymph nodes: Secondary | ICD-10-CM | POA: Insufficient documentation

## 2015-12-08 DIAGNOSIS — C5701 Malignant neoplasm of right fallopian tube: Secondary | ICD-10-CM | POA: Insufficient documentation

## 2015-12-08 DIAGNOSIS — Z86718 Personal history of other venous thrombosis and embolism: Secondary | ICD-10-CM | POA: Insufficient documentation

## 2015-12-08 DIAGNOSIS — Z5112 Encounter for antineoplastic immunotherapy: Secondary | ICD-10-CM | POA: Diagnosis not present

## 2015-12-08 DIAGNOSIS — C774 Secondary and unspecified malignant neoplasm of inguinal and lower limb lymph nodes: Secondary | ICD-10-CM

## 2015-12-08 DIAGNOSIS — Z923 Personal history of irradiation: Secondary | ICD-10-CM | POA: Insufficient documentation

## 2015-12-08 DIAGNOSIS — Z8041 Family history of malignant neoplasm of ovary: Secondary | ICD-10-CM

## 2015-12-08 DIAGNOSIS — Z7901 Long term (current) use of anticoagulants: Secondary | ICD-10-CM | POA: Diagnosis not present

## 2015-12-08 DIAGNOSIS — Z79899 Other long term (current) drug therapy: Secondary | ICD-10-CM | POA: Diagnosis not present

## 2015-12-08 DIAGNOSIS — R490 Dysphonia: Secondary | ICD-10-CM | POA: Insufficient documentation

## 2015-12-08 LAB — URINALYSIS COMPLETE WITH MICROSCOPIC (ARMC ONLY)
BACTERIA UA: NONE SEEN
BILIRUBIN URINE: NEGATIVE
GLUCOSE, UA: NEGATIVE mg/dL
Hgb urine dipstick: NEGATIVE
KETONES UR: NEGATIVE mg/dL
Nitrite: NEGATIVE
PH: 6 (ref 5.0–8.0)
Protein, ur: NEGATIVE mg/dL
Specific Gravity, Urine: 1.01 (ref 1.005–1.030)

## 2015-12-08 LAB — CBC WITH DIFFERENTIAL/PLATELET
BASOS PCT: 1 %
Basophils Absolute: 0 10*3/uL (ref 0–0.1)
Eosinophils Absolute: 0.1 10*3/uL (ref 0–0.7)
Eosinophils Relative: 2 %
HEMATOCRIT: 34.1 % — AB (ref 35.0–47.0)
HEMOGLOBIN: 11.4 g/dL — AB (ref 12.0–16.0)
LYMPHS PCT: 29 %
Lymphs Abs: 1.1 10*3/uL (ref 1.0–3.6)
MCH: 30.5 pg (ref 26.0–34.0)
MCHC: 33.5 g/dL (ref 32.0–36.0)
MCV: 91 fL (ref 80.0–100.0)
MONO ABS: 0.6 10*3/uL (ref 0.2–0.9)
MONOS PCT: 17 %
NEUTROS ABS: 1.9 10*3/uL (ref 1.4–6.5)
NEUTROS PCT: 51 %
Platelets: 387 10*3/uL (ref 150–440)
RBC: 3.74 MIL/uL — ABNORMAL LOW (ref 3.80–5.20)
RDW: 15.2 % — ABNORMAL HIGH (ref 11.5–14.5)
WBC: 3.7 10*3/uL (ref 3.6–11.0)

## 2015-12-08 LAB — COMPREHENSIVE METABOLIC PANEL
ALBUMIN: 3.4 g/dL — AB (ref 3.5–5.0)
ALK PHOS: 48 U/L (ref 38–126)
ALT: 12 U/L — ABNORMAL LOW (ref 14–54)
ANION GAP: 5 (ref 5–15)
AST: 24 U/L (ref 15–41)
BILIRUBIN TOTAL: 0.5 mg/dL (ref 0.3–1.2)
BUN: 18 mg/dL (ref 6–20)
CALCIUM: 8.6 mg/dL — AB (ref 8.9–10.3)
CO2: 28 mmol/L (ref 22–32)
Chloride: 107 mmol/L (ref 101–111)
Creatinine, Ser: 0.75 mg/dL (ref 0.44–1.00)
GFR calc Af Amer: >60 mL/min (ref >60)
GLUCOSE: 105 mg/dL — AB (ref 65–99)
Potassium: 3.9 mmol/L (ref 3.5–5.1)
Sodium: 140 mmol/L (ref 135–145)
TOTAL PROTEIN: 6.8 g/dL (ref 6.5–8.1)

## 2015-12-08 MED ORDER — SODIUM CHLORIDE 0.9 % IJ SOLN
10.0000 mL | INTRAMUSCULAR | Status: DC | PRN
  Administered 2015-12-08: 10 mL
  Filled 2015-12-08: qty 10

## 2015-12-08 MED ORDER — HEPARIN SOD (PORK) LOCK FLUSH 100 UNIT/ML IV SOLN
500.0000 [IU] | Freq: Once | INTRAVENOUS | Status: AC | PRN
  Administered 2015-12-08: 500 [IU]

## 2015-12-08 MED ORDER — HEPARIN SOD (PORK) LOCK FLUSH 100 UNIT/ML IV SOLN
500.0000 [IU] | Freq: Once | INTRAVENOUS | Status: AC
  Administered 2015-12-08: 500 [IU] via INTRAVENOUS

## 2015-12-08 MED ORDER — SODIUM CHLORIDE 0.9 % IV SOLN
10.0000 mg/kg | Freq: Once | INTRAVENOUS | Status: AC
  Administered 2015-12-08: 625 mg via INTRAVENOUS
  Filled 2015-12-08: qty 16

## 2015-12-08 MED ORDER — SODIUM CHLORIDE 0.9 % IV SOLN
Freq: Once | INTRAVENOUS | Status: AC
  Administered 2015-12-08: 11:00:00 via INTRAVENOUS
  Filled 2015-12-08: qty 1000

## 2015-12-08 MED ORDER — SODIUM CHLORIDE 0.9 % IJ SOLN
10.0000 mL | Freq: Once | INTRAMUSCULAR | Status: AC
  Administered 2015-12-08: 10 mL via INTRAVENOUS
  Filled 2015-12-08: qty 10

## 2015-12-08 NOTE — Assessment & Plan Note (Addendum)
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 5th July 2017 CT scan shows- improved inguinal adenopathy. STOP/declines Zejula sec to concerns of SEs; OCT- CT scan- STABLE Lymphadenopathy in right groin.  # proceed with AVASTIN today; labs okay; UA- pending today.   #  Right R swelling-chronic ? Sec to LN-  STABLE.    # Chronic UTIs- on macrobid.   # / follow up in 4 weeks/ cbc/cmp/UA/Avastin.

## 2015-12-08 NOTE — Progress Notes (Signed)
[name] OFFICE PROGRESS NOTE  Patient Care Team: Ezequiel Kayser, [name]  No matching staging information was found for the patient.   Oncology History   . 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  Adequate TRS, no macroscopic residual. IP/IV chemotherapy with DDP and paclitaxel on GOG protocol, chemo and Bev consolidation completed in 03/2011 2. 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 November 14, 2011- Patient was started on carboplatin and gemcitabine 4. Finished 6 cycles of chemotherapy in April of 2014 with carboplatin and gemcitabine. Tolerance was fairly good except for neutropenia and thrombocytopenia. 5.recurrent disease by CT scan February of 2015  # .radiation therapy to pelvis  May of 2015  7.upper extremity  . and deep vein thrombosis associated with port.(June of 2015) patient started on   [place] had port removed and had   thrombectomy September 06, 2013. # CT DEC 2016- progressing disease in the right inguinal area # FEB-TAXOL-AVASTIN;  # FEB 2017- AVASTIN q 3W; July 2017- CT- Improved  Right Inguinal LN; STOP AVASTIN sec to potential concerns of AEs  # AUG 2017 3rd- START ZEJULA- declines sec to intol  # OCT 7th CT- STABLE RIGHT INGUINAL LN- RE-START AVASTIN q 3W       Malignant neoplasm of right fallopian tube ([place])   10/05/2009 Initial Diagnosis    Carcinoma of fallopian tube        INTERVAL HISTORY:  Nicole Clayton 82 y.o.  female pleasant patient above history of Fallopian tube cancer metastatic currently  Avastin is here for follow-up.  She is chronic swelling of right lower extremity- to be due to her lymph node masses in the right groin. She has not noted any of the lymph nodes getting any bigger. Otherwise no weight loss or abdominal bloating. Otherwise no fever.Marland Kitchen Appetite is fair.   REVIEW OF SYSTEMS:  A complete 10  point review of system is done which is negative except mentioned above/history of present illness. Complains of hoarseness of voice. She has history of chronic UTIs on Macrobid prophylaxis.  PAST MEDICAL HISTORY :  Past Medical History:  Diagnosis Date  . Bladder infection, chronic 10/19/2011  . Cancer (HCC)    Ovarian   . Carcinoma of fallopian tube ([place]) 10/05/2009   Overview:  Overview:  Overview:  Drs. Claiborne Rigg and Delorise Shiner Choksi Overview:  Drs. Claiborne Rigg and [employer]   . GERD (gastroesophageal reflux disease)   . MI (mitral incompetence) 10/27/2014   Overview:  MODERATE   . Mitral valve prolapse   . OAB (overactive bladder)   . Tachycardia     PAST SURGICAL HISTORY :   Past Surgical History:  Procedure Laterality Date  . ABDOMINAL HYSTERECTOMY    . CHOLECYSTECTOMY    . LAPAROSCOPIC BILATERAL SALPINGO OOPHERECTOMY  2011    FAMILY HISTORY :   Family History  Problem Relation Age of Onset  . Ovarian cancer Other   . Breast cancer Neg Hx   . Colon cancer Neg Hx   . Diabetes Neg Hx   . Heart disease Neg Hx     SOCIAL HISTORY:   Social History  Substance Use Topics  . Smoking status: Never Smoker  . Smokeless tobacco: Never Used  . Alcohol use No    ALLERGIES:  is allergic to benadryl [diphenhydramine]; tamsulosin; alendronate sodium; duloxetine hcl; risedronate sodium; nitrofurantoin; lovastatin; and sulfa antibiotics.  MEDICATIONS:  Current Outpatient Prescriptions  Medication Sig Dispense Refill  . acetaminophen (TYLENOL) 500 MG tablet Take 1 tablet (500 mg total) by mouth every 6 (six) hours as needed. 30 tablet 0  . Ascorbic Acid (VITAMIN C) 100 MG tablet Take 100 mg by mouth daily.    Marland Kitchen aspirin EC 81 MG tablet Take 81 mg by mouth.    . cetirizine (ZYRTEC) 10 MG tablet Take by mouth.    . Cholecalciferol (VITAMIN D3) 2000 UNITS capsule Take by mouth.    . docusate sodium (COLACE) 100 MG capsule Take 100 mg by mouth.    . fluticasone (FLONASE) 50  MCG/ACT nasal spray Place into the nose.    . gabapentin (NEURONTIN) 600 MG tablet Take 1 tablet (600 mg total) by mouth 3 (three) times daily. 180 tablet 3  . loratadine (CLARITIN) 10 MG tablet Take by mouth.    . metoprolol succinate (TOPROL-XL) 50 MG 24 hr tablet Take 50 mg by mouth.    . metoprolol succinate (TOPROL-XL) 50 MG 24 hr tablet     . nitrofurantoin, macrocrystal-monohydrate, (MACROBID) 100 MG capsule Take 100 mg by mouth 2 (two) times daily.    Marland Kitchen nystatin ointment (MYCOSTATIN) Apply 1 application topically 2 (two) times daily. 30 g 0  . ondansetron (ZOFRAN) 4 MG tablet TAKE (1) TABLET BY MOUTH EVERY 8 HOURS AS NEEDED FOR NAUSEA/VOMITING 30 tablet 3  . pantoprazole (PROTONIX) 40 MG tablet Take 40 mg by mouth.    . triamcinolone ointment (KENALOG) 0.1 % Apply 1 application topically 2 (two) times daily. 30 g 1   No current facility-administered medications for this visit.    Facility-Administered Medications Ordered in Other Visits  Medication Dose Route Frequency Provider Last Rate Last Dose  . diphenhydrAMINE (BENADRYL) injection 50 mg  50 mg Intravenous Once Forest Gleason, MD      . sodium chloride 0.9 % injection 10 mL  10 mL Intravenous PRN Forest Gleason, MD   10 mL at 01/12/15 1116  . sodium chloride 0.9 % injection 10 mL  10 mL Intravenous PRN Forest Gleason, MD   10 mL at 02/16/15 1025    PHYSICAL EXAMINATION: ECOG PERFORMANCE STATUS: 2 - Symptomatic, <50% confined to bed  BP 114/71 (BP Location: Left Arm, Patient Position: Sitting)   Pulse (!) 56   Temp (!) 96.9 F (36.1 C) (Tympanic)   Resp 18   Wt 138 lb (62.6 kg)   SpO2 94%   BMI 26.95 kg/m   Filed Weights   12/08/15 1047  Weight: 138 lb (62.6 kg)    GENERAL: Well-nourished well-developed; Alert, no distress and comfortable.  She is walking herself. Accompanied by daughter. EYES: no pallor or icterus OROPHARYNX: no thrush or ulceration; good dentition  NECK: supple, no masses felt LYMPH:  no palpable  lymphadenopathy in the cervical, axillary; Right ingiunal LN ~2-3cm in size.  LUNGS: clear to auscultation and  No wheeze or crackles HEART/CVS: regular rate & rhythm and no murmurs; chronic right lower extremity swelling ABDOMEN:abdomen soft, non-tender and normal bowel sounds Musculoskeletal:no cyanosis of digits and no clubbing  PSYCH: alert & oriented x 3 with fluent speech NEURO: no focal motor/sensory deficits SKIN:  no rashes or significant lesions  LABORATORY DATA:  I have reviewed the data as listed    Component Value Date/Time   NA 140 12/08/2015 1012   NA 138 06/02/2014 0910   K 3.9 12/08/2015 1012   K 3.7 06/02/2014 0910   CL 107 12/08/2015  1012   CL 107 06/02/2014 0910   CO2 28 12/08/2015 1012   CO2 26 06/02/2014 0910   GLUCOSE 105 (H) 12/08/2015 1012   GLUCOSE 106 (H) 06/02/2014 0910   BUN 18 12/08/2015 1012   BUN 17 06/02/2014 0910   CREATININE 0.75 12/08/2015 1012   CREATININE 1.00 06/02/2014 0910   CALCIUM 8.6 (L) 12/08/2015 1012   CALCIUM 8.6 (L) 06/02/2014 0910   PROT 6.8 12/08/2015 1012   PROT 6.3 (L) 06/02/2014 0910   ALBUMIN 3.4 (L) 12/08/2015 1012   ALBUMIN 3.4 (L) 06/02/2014 0910   AST 24 12/08/2015 1012   AST 23 06/02/2014 0910   ALT 12 (L) 12/08/2015 1012   ALT 10 (L) 06/02/2014 0910   ALKPHOS 48 12/08/2015 1012   ALKPHOS 46 06/02/2014 0910   BILITOT 0.5 12/08/2015 1012   BILITOT 0.5 06/02/2014 0910   GFRNONAA >60 12/08/2015 1012   GFRNONAA 52 (L) 06/02/2014 0910   GFRAA >60 12/08/2015 1012   GFRAA >60 06/02/2014 0910    No results found for: SPEP, UPEP  Lab Results  Component Value Date   WBC 3.7 12/08/2015   NEUTROABS 1.9 12/08/2015   HGB 11.4 (L) 12/08/2015   HCT 34.1 (L) 12/08/2015   MCV 91.0 12/08/2015   PLT 387 12/08/2015      Chemistry      Component Value Date/Time   NA 140 12/08/2015 1012   NA 138 06/02/2014 0910   K 3.9 12/08/2015 1012   K 3.7 06/02/2014 0910   CL 107 12/08/2015 1012   CL 107 06/02/2014 0910   CO2  28 12/08/2015 1012   CO2 26 06/02/2014 0910   BUN 18 12/08/2015 1012   BUN 17 06/02/2014 0910   CREATININE 0.75 12/08/2015 1012   CREATININE 1.00 06/02/2014 0910      Component Value Date/Time   CALCIUM 8.6 (L) 12/08/2015 1012   CALCIUM 8.6 (L) 06/02/2014 0910   ALKPHOS 48 12/08/2015 1012   ALKPHOS 46 06/02/2014 0910   AST 24 12/08/2015 1012   AST 23 06/02/2014 0910   ALT 12 (L) 12/08/2015 1012   ALT 10 (L) 06/02/2014 0910   BILITOT 0.5 12/08/2015 1012   BILITOT 0.5 06/02/2014 0910       RADIOGRAPHIC STUDIES: I have personally reviewed the radiological images as listed and agreed with the findings in the report. No results found.   ASSESSMENT & PLAN:  Malignant neoplasm of right fallopian tube [facility]) Metastatic fallopian tube cancer- currently on Avastin since February 2017. 5th July 2017 CT scan shows- improved inguinal adenopathy. STOP/declines Zejula sec to concerns of SEs; OCT- CT scan- STABLE Lymphadenopathy in right groin.  # proceed with AVASTIN today; labs okay; UA- pending today.   #  Right R swelling-chronic ? Sec to LN-  STABLE.    # Chronic UTIs- on macrobid.   # / follow up in 4 weeks/ cbc/cmp/UA/Avastin.    Orders Placed This Encounter  Procedures  . CBC with Differential    Standing Status:   Future    Standing Expiration Date:   12/07/2016  . Comprehensive metabolic panel    Standing Status:   Future    Standing Expiration Date:   12/07/2016  . Urinalysis complete, with microscopic [facility])    Standing Status:   Future    Standing Expiration Date:   12/07/2016   All questions were answered. The patient knows to call the clinic with any problems, questions or concerns.      Lenetta Quaker R  Rogue Bussing, MD 12/08/2015 5:53 PM

## 2015-12-08 NOTE — Progress Notes (Signed)
Patient is here for follow up, needs a refill on macrobid this was given by Dr. Oliva Bustard

## 2015-12-18 ENCOUNTER — Telehealth: Payer: Self-pay | Admitting: *Deleted

## 2015-12-18 MED ORDER — NITROFURANTOIN MONOHYD MACRO 100 MG PO CAPS: 100.0000 mg | ORAL_CAPSULE | Freq: Two times a day (BID) | ORAL | 3 refills | Status: DC

## 2015-12-18 NOTE — Telephone Encounter (Signed)
Patient requested refill of Macrobid. Medication refilled per MD order.

## 2015-12-26 ENCOUNTER — Encounter: Payer: Self-pay | Admitting: Urology

## 2015-12-26 ENCOUNTER — Ambulatory Visit: Payer: Medicare HMO | Admitting: Urology

## 2015-12-26 VITALS — BP 143/87 | HR 91 | Ht 60.0 in | Wt 137.0 lb

## 2015-12-26 DIAGNOSIS — B373 Candidiasis of vulva and vagina: Secondary | ICD-10-CM

## 2015-12-26 DIAGNOSIS — B3731 Acute candidiasis of vulva and vagina: Secondary | ICD-10-CM

## 2015-12-26 DIAGNOSIS — N390 Urinary tract infection, site not specified: Secondary | ICD-10-CM

## 2015-12-26 DIAGNOSIS — N8111 Cystocele, midline: Secondary | ICD-10-CM | POA: Diagnosis not present

## 2015-12-26 DIAGNOSIS — N952 Postmenopausal atrophic vaginitis: Secondary | ICD-10-CM | POA: Diagnosis not present

## 2015-12-26 MED ORDER — NYSTATIN 100000 UNIT/GM EX CREA: 1.0000 "application " | TOPICAL_CREAM | Freq: Two times a day (BID) | CUTANEOUS | 0 refills | Status: DC

## 2015-12-26 NOTE — Progress Notes (Signed)
12/26/2015 3:13 PM   Earney Navy 12/23/1928 [id number]  Referring provider: Ezequiel Kayser, MD [address] [address] [place], [place] [address]  Chief Complaint  Patient presents with  . Recurrent UTI    3 month follow up  . Vaginal Atrophy    HPI: Patient is a 82 -year-old Caucasian female who presents today for a three month follow up for recurrent UTI's and vaginal atrophy.    Background history Patient was referred to [place] by, Dr. Raechel Ache, for recurrent urinary tract infections.  Patient states that she has had an urinary tract infection every week this summer.  Her symptoms with a urinary tract infection consist of suprapubic pain, dysuria and a bad odor to her urine.  She denies gross hematuria, abdominal pain or flank pain.  She has not had any recent fevers, chills, nausea or vomiting.   She does not have a history of nephrolithiasis or GU trauma.   She does have a history of GU surgery (pelvic sling).   Reviewing her records,  she has had two documented infections over the last year.    + Proteus- pan sensitive on 11/06/2014  + E. Coli- variable sensitivities on 07/14/2015  She is post menopausal.  She denies constipation and/or diarrhea.  She does engage in good perineal hygiene. She does not take tub baths.  She has not had incontinence.    She had a CT abd/pelvis with contrast due to a history of fallopian tube carcinoma.  No abnormality seen in the kidney or the bladder.  She is drinking 16 oz of water daily.     Today, she is having frequency, nocturia and incontinence.  She denies any current dysuria.  She is not having gross hematuria or suprapubic pain.  She is not having fevers, chills, nausea or vomiting.  She feels as if she is having a vaginal yeast infection.     PMH: Past Medical History:  Diagnosis Date  . Bladder infection, chronic 10/19/2011  . Cancer (HCC)    Ovarian   . Carcinoma of fallopian tube ([place]) 10/05/2009   Overview:  Overview:   Overview:  Drs. Claiborne Rigg and Delorise Shiner Choksi Overview:  Drs. Claiborne Rigg and [employer]   . GERD (gastroesophageal reflux disease)   . MI (mitral incompetence) 10/27/2014   Overview:  MODERATE   . Mitral valve prolapse   . OAB (overactive bladder)   . Tachycardia     Surgical History: Past Surgical History:  Procedure Laterality Date  . ABDOMINAL HYSTERECTOMY    . CHOLECYSTECTOMY    . LAPAROSCOPIC BILATERAL SALPINGO OOPHERECTOMY  2011  . ovarian cancer surgery  2011    Home Medications:    Medication List       Accurate as of 12/26/15  3:13 PM. Always use your most recent med list.          acetaminophen 500 MG tablet Commonly known as:  TYLENOL Take 1 tablet (500 mg total) by mouth every 6 (six) hours as needed.   aspirin EC 81 MG tablet Take 81 mg by mouth.   cetirizine 10 MG tablet Commonly known as:  ZYRTEC Take by mouth.   docusate sodium 100 MG capsule Commonly known as:  COLACE Take 100 mg by mouth.   fluticasone 50 MCG/ACT nasal spray Commonly known as:  FLONASE Place into the nose.   gabapentin 600 MG tablet Commonly known as:  NEURONTIN Take 1 tablet (600 mg total) by mouth 3 (three)  times daily.   loratadine 10 MG tablet Commonly known as:  CLARITIN Take by mouth.   metoprolol succinate 50 MG 24 hr tablet Commonly known as:  TOPROL-XL Take 50 mg by mouth.   metoprolol succinate 50 MG 24 hr tablet Commonly known as:  TOPROL-XL   nitrofurantoin (macrocrystal-monohydrate) 100 MG capsule Commonly known as:  MACROBID Take 1 capsule (100 mg total) by mouth 2 (two) times daily.   nystatin ointment Commonly known as:  MYCOSTATIN Apply 1 application topically 2 (two) times daily.   nystatin cream Commonly known as:  MYCOSTATIN Apply 1 application topically 2 (two) times daily.   ondansetron 4 MG tablet Commonly known as:  ZOFRAN TAKE (1) TABLET BY MOUTH EVERY 8 HOURS AS NEEDED FOR NAUSEA/VOMITING   pantoprazole 40 MG  tablet Commonly known as:  PROTONIX Take 40 mg by mouth.   triamcinolone ointment 0.1 % Commonly known as:  KENALOG Apply 1 application topically 2 (two) times daily.   vitamin C 100 MG tablet Take 100 mg by mouth daily.   Vitamin D3 2000 units capsule Take by mouth.       Allergies:  Allergies  Allergen Reactions  . Benadryl [Diphenhydramine] Shortness Of Breath    Sob, rash, swelling  . Tamsulosin Swelling  . Alendronate Sodium     Other reaction(s): Other (See Comments) Dysphagia  . Duloxetine Hcl Diarrhea  . Risedronate Sodium Rash    Aching, dysphagia  . Lovastatin     Other reaction(s): Other (See Comments) GI upset  . Sulfa Antibiotics Rash    Family History: Family History  Problem Relation Age of Onset  . Ovarian cancer Other   . Breast cancer Neg Hx   . Colon cancer Neg Hx   . Diabetes Neg Hx   . Heart disease Neg Hx   . Kidney disease Neg Hx   . Bladder Cancer Neg Hx     Social History:  reports that she has never smoked. She has never used smokeless tobacco. She reports that she does not drink alcohol or use drugs.  ROS: UROLOGY Frequent Urination?: Yes Hard to postpone urination?: No Burning/pain with urination?: No Get up at night to urinate?: Yes Leakage of urine?: Yes Urine stream starts and stops?: No Trouble starting stream?: No Do you have to strain to urinate?: No Blood in urine?: No Urinary tract infection?: No Sexually transmitted disease?: No Injury to kidneys or bladder?: No Painful intercourse?: No Weak stream?: No Currently pregnant?: No Vaginal bleeding?: No Last menstrual period?: n  Gastrointestinal Nausea?: No Vomiting?: No Indigestion/heartburn?: No Diarrhea?: No Constipation?: Yes  Constitutional Fever: No Night sweats?: No Weight loss?: No Fatigue?: No  Skin Skin rash/lesions?: No Itching?: No  Eyes Blurred vision?: No Double vision?: No  Ears/Nose/Throat Sore throat?: No Sinus problems?:  No  Hematologic/Lymphatic Swollen glands?: No Easy bruising?: Yes  Cardiovascular Leg swelling?: Yes Chest pain?: No  Respiratory Cough?: No Shortness of breath?: Yes  Endocrine Excessive thirst?: No  Musculoskeletal Back pain?: No Joint pain?: Yes  Neurological Headaches?: No Dizziness?: No  Psychologic Depression?: No Anxiety?: No  Physical Exam: BP (!) 143/87   Pulse 91   Ht 5' (1.524 m)   Wt 137 lb (62.1 kg)   BMI 26.76 kg/m   Constitutional: Well nourished. Alert and oriented, No acute distress. HEENT: [place] AT, moist mucus membranes. Trachea midline, no masses. Cardiovascular: No clubbing, cyanosis, or edema. Respiratory: Normal respiratory effort, no increased work of breathing. GI: Abdomen is soft, non tender,  non distended, no abdominal masses. Liver and spleen not palpable.  No hernias appreciated.  Stool sample for occult testing is not indicated.   GU: No CVA tenderness.  No bladder fullness or masses.  Yeast discharge in the clitoral hood.  Atrophic external genitalia, normal pubic hair distribution, no lesions.  Normal urethral meatus, no lesions, no prolapse, no discharge.   Urethral caruncle is noted.  Pessary in place,   No bladder fullness, tenderness or masses. Pale vagina mucosa, poor estrogen effect, no discharge, no lesions.   Anus and perineum are without rashes or lesions.    Skin: No rashes, bruises or suspicious lesions. Lymph: No cervical or inguinal adenopathy. Neurologic: Grossly intact, no focal deficits, moving all 4 extremities. Psychiatric: Normal mood and affect.  Laboratory Data: Lab Results  Component Value Date   WBC 3.7 12/08/2015   HGB 11.4 (L) 12/08/2015   HCT 34.1 (L) 12/08/2015   MCV 91.0 12/08/2015   PLT 387 12/08/2015    Lab Results  Component Value Date   CREATININE 0.75 12/08/2015    Lab Results  Component Value Date   TSH 1.99 02/03/2014    Lab Results  Component Value Date   AST 24 12/08/2015   Lab  Results  Component Value Date   ALT 12 (L) 12/08/2015    Pertinent Imaging: CLINICAL DATA:  Restaging fallopian tube carcinoma. Initial diagnosis 2011.  EXAM: CT ABDOMEN AND PELVIS WITH CONTRAST  TECHNIQUE: Multidetector CT imaging of the abdomen and pelvis was performed using the standard protocol following bolus administration of intravenous contrast.  CONTRAST:  32mL ISOVUE-300 IOPAMIDOL (ISOVUE-300) INJECTION 61%  COMPARISON:  01/11/2015  FINDINGS: Lower chest: The lung bases are clear of acute process. No worrisome pulmonary nodules or pleural effusion. The heart is within normal limits in size for age. No pericardial effusion. Stable large hiatal hernia.  Hepatobiliary: No focal hepatic lesions or intrahepatic biliary dilatation. The hemangioma involving the right hepatic lobe near the kidney is not as well seen on today's examination.  Pancreas: Stable mild common bile duct dilatation status post cholecystectomy. No pancreatic mass, inflammation or ductal dilatation.  Spleen: Stable small spleen.  No focal lesions.  Adrenals/Urinary Tract: The adrenal glands and kidneys are unremarkable and stable.  Stomach/Bowel: Most of the stomach is up in the chest. The duodenum, small bowel and colon are unremarkable. No inflammatory changes, mass lesions or obstructive findings. Stable diverticulosis. No findings for acute diverticulitis. The terminal ileum is normal.  Vascular/Lymphatic: No mesenteric or retroperitoneal mass or adenopathy. Small scattered lymph nodes are stable. The aorta is normal in caliber. Stable atherosclerotic calcifications. The major venous structures are patent.  Reproductive: Status post hysterectomy.  Other: No pelvic mass or lymphadenopathy. The bladder appears normal. A pessary ring is noted in the vagina.  Interval decrease in size of the necrotic adenopathy in the right inguinal area. The more superior and lateral  nodal lesion measures 27 x 19 mm and previously measured 32 x 31 mm. The more inferior and medial lesion measures 25 x 24 mm and previously measured 48 x 35 mm. No left-sided inguinal adenopathy.  Musculoskeletal: No significant bony findings.  IMPRESSION: 1. Interval decrease in size of the necrotic appearing right inguinal adenopathy. 2. No abdominal/pelvic lymphadenopathy or findings for peritoneal surface disease. 3. Status post cholecystectomy with stable mild associated biliary dilatation. 4. Stable large hiatal hernia.   Electronically Signed   By: Marijo Sanes M.D.   On: 08/09/2015 12:01  Assessment & Plan:    1. Recurrent UTI's  - reviewed UTI prevention  - start Macrobid 100 mg daily for suppressive therapy  - asked patient to present to our office with symptoms of an UTI                              2. Vaginal atrophy  - hold vaginal estrogen cream for now until she can speak with her oncologist, as she feels it is helping  3. Cystocele  - managed by gynecology  - pessary in place  4. Vaginal yeast infection  - yeast like discharge in the clitoral hood  - nystatin cream is prescribed to apply bid  Return in about 2 weeks (around 01/09/2016) for patient will call.  These notes generated with voice recognition software. I apologize for typographical errors.  Zara Council, [facility] [address] [place], [place] [address] [phone]

## 2015-12-28 ENCOUNTER — Inpatient Hospital Stay: Payer: Medicare HMO

## 2016-01-05 ENCOUNTER — Ambulatory Visit: Payer: Medicare HMO | Admitting: Internal Medicine

## 2016-01-05 ENCOUNTER — Inpatient Hospital Stay: Payer: Medicare HMO | Attending: Internal Medicine

## 2016-01-05 ENCOUNTER — Other Ambulatory Visit: Payer: Medicare HMO

## 2016-01-05 ENCOUNTER — Inpatient Hospital Stay: Payer: Medicare HMO

## 2016-01-05 ENCOUNTER — Inpatient Hospital Stay (HOSPITAL_BASED_OUTPATIENT_CLINIC_OR_DEPARTMENT_OTHER): Payer: Medicare HMO | Admitting: Internal Medicine

## 2016-01-05 ENCOUNTER — Other Ambulatory Visit: Payer: Self-pay | Admitting: *Deleted

## 2016-01-05 ENCOUNTER — Ambulatory Visit: Payer: Medicare HMO

## 2016-01-05 DIAGNOSIS — Z8543 Personal history of malignant neoplasm of ovary: Secondary | ICD-10-CM | POA: Insufficient documentation

## 2016-01-05 DIAGNOSIS — C5701 Malignant neoplasm of right fallopian tube: Secondary | ICD-10-CM

## 2016-01-05 DIAGNOSIS — Z923 Personal history of irradiation: Secondary | ICD-10-CM | POA: Insufficient documentation

## 2016-01-05 DIAGNOSIS — R0981 Nasal congestion: Secondary | ICD-10-CM

## 2016-01-05 DIAGNOSIS — Z86718 Personal history of other venous thrombosis and embolism: Secondary | ICD-10-CM

## 2016-01-05 DIAGNOSIS — R490 Dysphonia: Secondary | ICD-10-CM | POA: Diagnosis not present

## 2016-01-05 DIAGNOSIS — N390 Urinary tract infection, site not specified: Secondary | ICD-10-CM | POA: Diagnosis not present

## 2016-01-05 DIAGNOSIS — Z79899 Other long term (current) drug therapy: Secondary | ICD-10-CM | POA: Diagnosis not present

## 2016-01-05 DIAGNOSIS — N3281 Overactive bladder: Secondary | ICD-10-CM | POA: Diagnosis not present

## 2016-01-05 DIAGNOSIS — I252 Old myocardial infarction: Secondary | ICD-10-CM | POA: Diagnosis not present

## 2016-01-05 DIAGNOSIS — Z87440 Personal history of urinary (tract) infections: Secondary | ICD-10-CM | POA: Diagnosis not present

## 2016-01-05 DIAGNOSIS — Z9221 Personal history of antineoplastic chemotherapy: Secondary | ICD-10-CM | POA: Insufficient documentation

## 2016-01-05 DIAGNOSIS — C774 Secondary and unspecified malignant neoplasm of inguinal and lower limb lymph nodes: Secondary | ICD-10-CM

## 2016-01-05 DIAGNOSIS — I341 Nonrheumatic mitral (valve) prolapse: Secondary | ICD-10-CM | POA: Insufficient documentation

## 2016-01-05 DIAGNOSIS — R600 Localized edema: Secondary | ICD-10-CM

## 2016-01-05 DIAGNOSIS — Z5112 Encounter for antineoplastic immunotherapy: Secondary | ICD-10-CM | POA: Diagnosis present

## 2016-01-05 DIAGNOSIS — Z7982 Long term (current) use of aspirin: Secondary | ICD-10-CM | POA: Diagnosis not present

## 2016-01-05 DIAGNOSIS — Z792 Long term (current) use of antibiotics: Secondary | ICD-10-CM | POA: Diagnosis not present

## 2016-01-05 DIAGNOSIS — K219 Gastro-esophageal reflux disease without esophagitis: Secondary | ICD-10-CM | POA: Diagnosis not present

## 2016-01-05 DIAGNOSIS — R300 Dysuria: Secondary | ICD-10-CM

## 2016-01-05 LAB — URINALYSIS COMPLETE WITH MICROSCOPIC (ARMC ONLY)
BILIRUBIN URINE: NEGATIVE
Bacteria, UA: NONE SEEN
Glucose, UA: NEGATIVE mg/dL
Hgb urine dipstick: NEGATIVE
KETONES UR: NEGATIVE mg/dL
Nitrite: NEGATIVE
PROTEIN: NEGATIVE mg/dL
Specific Gravity, Urine: 1.014 (ref 1.005–1.030)
pH: 6 (ref 5.0–8.0)

## 2016-01-05 LAB — CBC WITH DIFFERENTIAL/PLATELET
BASOS ABS: 0 10*3/uL (ref 0–0.1)
Basophils Relative: 1 %
Eosinophils Absolute: 0 10*3/uL (ref 0–0.7)
Eosinophils Relative: 1 %
HEMATOCRIT: 34.9 % — AB (ref 35.0–47.0)
Hemoglobin: 11.6 g/dL — ABNORMAL LOW (ref 12.0–16.0)
LYMPHS PCT: 30 %
Lymphs Abs: 1.3 10*3/uL (ref 1.0–3.6)
MCH: 30.2 pg (ref 26.0–34.0)
MCHC: 33.4 g/dL (ref 32.0–36.0)
MCV: 90.4 fL (ref 80.0–100.0)
MONO ABS: 0.6 10*3/uL (ref 0.2–0.9)
Monocytes Relative: 14 %
NEUTROS ABS: 2.3 10*3/uL (ref 1.4–6.5)
Neutrophils Relative %: 54 %
Platelets: 373 10*3/uL (ref 150–440)
RBC: 3.86 MIL/uL (ref 3.80–5.20)
RDW: 15 % — ABNORMAL HIGH (ref 11.5–14.5)
WBC: 4.3 10*3/uL (ref 3.6–11.0)

## 2016-01-05 LAB — COMPREHENSIVE METABOLIC PANEL
ALT: 13 U/L — AB (ref 14–54)
AST: 25 U/L (ref 15–41)
Albumin: 3.7 g/dL (ref 3.5–5.0)
Alkaline Phosphatase: 46 U/L (ref 38–126)
Anion gap: 7 (ref 5–15)
BILIRUBIN TOTAL: 0.6 mg/dL (ref 0.3–1.2)
BUN: 24 mg/dL — AB (ref 6–20)
CO2: 27 mmol/L (ref 22–32)
CREATININE: 0.88 mg/dL (ref 0.44–1.00)
Calcium: 8.9 mg/dL (ref 8.9–10.3)
Chloride: 104 mmol/L (ref 101–111)
GFR calc Af Amer: >60 mL/min (ref >60)
GFR, EST NON AFRICAN AMERICAN: 58 mL/min — AB (ref >60)
Glucose, Bld: 78 mg/dL (ref 65–99)
Potassium: 3.6 mmol/L (ref 3.5–5.1)
Sodium: 138 mmol/L (ref 135–145)
TOTAL PROTEIN: 7 g/dL (ref 6.5–8.1)

## 2016-01-05 MED ORDER — HEPARIN SOD (PORK) LOCK FLUSH 100 UNIT/ML IV SOLN
500.0000 [IU] | Freq: Once | INTRAVENOUS | Status: AC | PRN
  Administered 2016-01-05: 500 [IU]

## 2016-01-05 MED ORDER — SODIUM CHLORIDE 0.9 % IV SOLN
Freq: Once | INTRAVENOUS | Status: AC
  Administered 2016-01-05: 11:00:00 via INTRAVENOUS
  Filled 2016-01-05: qty 1000

## 2016-01-05 MED ORDER — SODIUM CHLORIDE 0.9 % IV SOLN
10.0000 mg/kg | Freq: Once | INTRAVENOUS | Status: AC
  Administered 2016-01-05: 625 mg via INTRAVENOUS
  Filled 2016-01-05: qty 16

## 2016-01-05 MED ORDER — SODIUM CHLORIDE 0.9 % IJ SOLN
10.0000 mL | INTRAMUSCULAR | Status: DC | PRN
  Administered 2016-01-05: 10 mL
  Filled 2016-01-05: qty 10

## 2016-01-05 NOTE — Progress Notes (Signed)
Patient is here today for follow up, she is doing well no complaints  

## 2016-01-05 NOTE — Assessment & Plan Note (Addendum)
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 5th July 2017 CT scan shows- improved inguinal adenopathy. STOP/declines Zejula sec to concerns of SEs; 3rd OCT- CT scan- STABLE Lymphadenopathy in right groin. No clinical progression noted.   # proceed with AVASTIN today; labs okay; UA- pending today.   #  Right R swelling-chronic ? Sec to LN-  STABLE.  Discussed re: referral lymphedema therapist; will hold for now.   # Chronic UTIs- on macrobid. Recently given script.   # / follow up in 3 weeks/ cbc/cmp/UA/Avastin. Will order CT scan at next visit.

## 2016-01-05 NOTE — Progress Notes (Signed)
[place] OFFICE PROGRESS NOTE  Patient Care Team: Ezequiel Kayser, [name]  No matching staging information was found for the patient.   Oncology History   . 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  Adequate TRS, no macroscopic residual. IP/IV chemotherapy with DDP and paclitaxel on GOG protocol, chemo and Bev consolidation completed in 03/2011 2. 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 November 14, 2011- Patient was started on carboplatin and gemcitabine 4. Finished 6 cycles of chemotherapy in April of 2014 with carboplatin and gemcitabine. Tolerance was fairly good except for neutropenia and thrombocytopenia. 5.recurrent disease by CT scan February of 2015  # .radiation therapy to pelvis  May of 2015  7.upper extremity  . and deep vein thrombosis associated with port.(June of 2015) patient started on   [place] had port removed and had   thrombectomy September 06, 2013. # CT DEC 2016- progressing disease in the right inguinal area # FEB-TAXOL-AVASTIN;  # FEB 2017- AVASTIN q 3W; July 2017- CT- Improved  Right Inguinal LN; STOP AVASTIN sec to potential concerns of AEs  # AUG 2017 3rd- START ZEJULA- declines sec to intol  # OCT 7th CT- STABLE RIGHT INGUINAL LN- RE-START AVASTIN q 3W       Malignant neoplasm of right fallopian tube ([place])   10/05/2009 Initial Diagnosis    Carcinoma of fallopian tube        INTERVAL HISTORY:  Nicole Clayton 82 y.o.  female pleasant patient above history of Fallopian tube cancer metastatic currently  Avastin is here for follow-up.  to have sinus congestion; no significant cough. Denies any shortness of breath or headaches or epistaxis. She continues to chronic swelling in the left leg. Otherwise no weight loss or abdominal bloating. Otherwise no fever.Marland Kitchen Appetite is fair.   REVIEW OF SYSTEMS:  A complete 10 point  review of system is done which is negative except mentioned above/history of present illness. Complains of hoarseness of voice. She has history of chronic UTIs on Macrobid prophylaxis.  PAST MEDICAL HISTORY :  Past Medical History:  Diagnosis Date  . Bladder infection, chronic 10/19/2011  . Cancer (HCC)    Ovarian   . Carcinoma of fallopian tube ([place]) 10/05/2009   Overview:  Overview:  Overview:  Drs. Claiborne Rigg and Delorise Shiner Choksi Overview:  Drs. Claiborne Rigg and [employer]   . GERD (gastroesophageal reflux disease)   . MI (mitral incompetence) 10/27/2014   Overview:  MODERATE   . Mitral valve prolapse   . OAB (overactive bladder)   . Tachycardia     PAST SURGICAL HISTORY :   Past Surgical History:  Procedure Laterality Date  . ABDOMINAL HYSTERECTOMY    . CHOLECYSTECTOMY    . LAPAROSCOPIC BILATERAL SALPINGO OOPHERECTOMY  2011  . ovarian cancer surgery  2011    FAMILY HISTORY :   Family History  Problem Relation Age of Onset  . Ovarian cancer Other   . Breast cancer Neg Hx   . Colon cancer Neg Hx   . Diabetes Neg Hx   . Heart disease Neg Hx   . Kidney disease Neg Hx   . Bladder Cancer Neg Hx     SOCIAL HISTORY:   Social History  Substance Use Topics  . Smoking status: Never Smoker  . Smokeless tobacco: Never Used  . Alcohol use No    ALLERGIES:  is allergic to benadryl [  diphenhydramine]; tamsulosin; alendronate sodium; duloxetine hcl; risedronate sodium; lovastatin; and sulfa antibiotics.  MEDICATIONS:  Current Outpatient Prescriptions  Medication Sig Dispense Refill  . acetaminophen (TYLENOL) 500 MG tablet Take 1 tablet (500 mg total) by mouth every 6 (six) hours as needed. 30 tablet 0  . Ascorbic Acid (VITAMIN C) 100 MG tablet Take 100 mg by mouth daily.    Marland Kitchen aspirin EC 81 MG tablet Take 81 mg by mouth.    . cetirizine (ZYRTEC) 10 MG tablet Take 10 mg by mouth daily.     . Cholecalciferol (VITAMIN D3) 2000 UNITS capsule Take 2,000 Units by mouth daily.      Marland Kitchen docusate sodium (COLACE) 100 MG capsule Take 100 mg by mouth.    . fluticasone (FLONASE) 50 MCG/ACT nasal spray Place 1 spray into both nostrils daily.     Marland Kitchen gabapentin (NEURONTIN) 600 MG tablet Take 1 tablet (600 mg total) by mouth 3 (three) times daily. 180 tablet 3  . nitrofurantoin, macrocrystal-monohydrate, (MACROBID) 100 MG capsule Take 1 capsule (100 mg total) by mouth 2 (two) times daily. (Patient taking differently: Take 100 mg by mouth daily. ) 30 capsule 3  . pantoprazole (PROTONIX) 40 MG tablet Take 40 mg by mouth.    . triamcinolone ointment (KENALOG) 0.1 % Apply 1 application topically 2 (two) times daily. 30 g 1  . metoprolol succinate (TOPROL-XL) 50 MG 24 hr tablet Take 50 mg by mouth daily.     Marland Kitchen nystatin cream (MYCOSTATIN) Apply 1 application topically 2 (two) times daily. (Patient not taking: Reported on 01/05/2016) 30 g 0  . nystatin ointment (MYCOSTATIN) Apply 1 application topically 2 (two) times daily. (Patient not taking: Reported on 01/05/2016) 30 g 0  . ondansetron (ZOFRAN) 4 MG tablet TAKE (1) TABLET BY MOUTH EVERY 8 HOURS AS NEEDED FOR NAUSEA/VOMITING (Patient not taking: Reported on 01/05/2016) 30 tablet 3   No current facility-administered medications for this visit.    Facility-Administered Medications Ordered in Other Visits  Medication Dose Route Frequency Provider Last Rate Last Dose  . diphenhydrAMINE (BENADRYL) injection 50 mg  50 mg Intravenous Once Forest Gleason, MD      . sodium chloride 0.9 % injection 10 mL  10 mL Intravenous PRN Forest Gleason, MD   10 mL at 01/12/15 1116  . sodium chloride 0.9 % injection 10 mL  10 mL Intravenous PRN Forest Gleason, MD   10 mL at 02/16/15 1025    PHYSICAL EXAMINATION: ECOG PERFORMANCE STATUS: 2 - Symptomatic, <50% confined to bed  BP 132/86 (BP Location: Left Arm, Patient Position: Sitting)   Pulse 89   Temp 97.4 F (36.3 C) (Tympanic)   Resp 18   Wt 136 lb (61.7 kg)   BMI 26.56 kg/m   Filed Weights   01/05/16  1008  Weight: 136 lb (61.7 kg)    GENERAL: Well-nourished well-developed; Alert, no distress and comfortable.  She is walking herself. Accompanied by daughter. EYES: no pallor or icterus OROPHARYNX: no thrush or ulceration; good dentition  NECK: supple, no masses felt LYMPH:  no palpable lymphadenopathy in the cervical, axillary; Right ingiunal LN ~2-3cm in size.  LUNGS: clear to auscultation and  No wheeze or crackles HEART/CVS: regular rate & rhythm and no murmurs; chronic right lower extremity swelling ABDOMEN:abdomen soft, non-tender and normal bowel sounds Musculoskeletal:no cyanosis of digits and no clubbing  PSYCH: alert & oriented x 3 with fluent speech NEURO: no focal motor/sensory deficits SKIN:  no rashes or significant lesions  LABORATORY  DATA:  I have reviewed the data as listed    Component Value Date/Time   NA 138 01/05/2016 0922   NA 138 06/02/2014 0910   K 3.6 01/05/2016 0922   K 3.7 06/02/2014 0910   CL 104 01/05/2016 0922   CL 107 06/02/2014 0910   CO2 27 01/05/2016 0922   CO2 26 06/02/2014 0910   GLUCOSE 78 01/05/2016 0922   GLUCOSE 106 (H) 06/02/2014 0910   BUN 24 (H) 01/05/2016 0922   BUN 17 06/02/2014 0910   CREATININE 0.88 01/05/2016 0922   CREATININE 1.00 06/02/2014 0910   CALCIUM 8.9 01/05/2016 0922   CALCIUM 8.6 (L) 06/02/2014 0910   PROT 7.0 01/05/2016 0922   PROT 6.3 (L) 06/02/2014 0910   ALBUMIN 3.7 01/05/2016 0922   ALBUMIN 3.4 (L) 06/02/2014 0910   AST 25 01/05/2016 0922   AST 23 06/02/2014 0910   ALT 13 (L) 01/05/2016 0922   ALT 10 (L) 06/02/2014 0910   ALKPHOS 46 01/05/2016 0922   ALKPHOS 46 06/02/2014 0910   BILITOT 0.6 01/05/2016 0922   BILITOT 0.5 06/02/2014 0910   GFRNONAA 58 (L) 01/05/2016 0922   GFRNONAA 52 (L) 06/02/2014 0910   GFRAA >60 01/05/2016 0922   GFRAA >60 06/02/2014 0910    No results found for: SPEP, UPEP  Lab Results  Component Value Date   WBC 4.3 01/05/2016   NEUTROABS 2.3 01/05/2016   HGB 11.6 (L)  01/05/2016   HCT 34.9 (L) 01/05/2016   MCV 90.4 01/05/2016   PLT 373 01/05/2016      Chemistry      Component Value Date/Time   NA 138 01/05/2016 0922   NA 138 06/02/2014 0910   K 3.6 01/05/2016 0922   K 3.7 06/02/2014 0910   CL 104 01/05/2016 0922   CL 107 06/02/2014 0910   CO2 27 01/05/2016 0922   CO2 26 06/02/2014 0910   BUN 24 (H) 01/05/2016 0922   BUN 17 06/02/2014 0910   CREATININE 0.88 01/05/2016 0922   CREATININE 1.00 06/02/2014 0910      Component Value Date/Time   CALCIUM 8.9 01/05/2016 0922   CALCIUM 8.6 (L) 06/02/2014 0910   ALKPHOS 46 01/05/2016 0922   ALKPHOS 46 06/02/2014 0910   AST 25 01/05/2016 0922   AST 23 06/02/2014 0910   ALT 13 (L) 01/05/2016 0922   ALT 10 (L) 06/02/2014 0910   BILITOT 0.6 01/05/2016 0922   BILITOT 0.5 06/02/2014 0910       RADIOGRAPHIC STUDIES: I have personally reviewed the radiological images as listed and agreed with the findings in the report. No results found.   ASSESSMENT & PLAN:  Malignant neoplasm of right fallopian tube [facility]) Metastatic fallopian tube cancer- currently on Avastin since February 2017. 5th July 2017 CT scan shows- improved inguinal adenopathy. STOP/declines [place] sec to concerns of SEs; 3rd OCT- CT scan- STABLE Lymphadenopathy in right groin. No clinical progression noted.   # proceed with AVASTIN today; labs okay; UA- pending today.   #  Right R swelling-chronic ? Sec to LN-  STABLE.  Discussed re: referral lymphedema therapist; will hold for now.   # Chronic UTIs- on macrobid. Recently given script.   # / follow up in 3 weeks/ cbc/cmp/UA/Avastin. Will order CT scan at next visit.    Orders Placed This Encounter  Procedures  . Urinalysis complete, with microscopic [facility])    Standing Status:   Standing    Number of Occurrences:   20  Standing Expiration Date:   01/04/2017   All questions were answered. The patient knows to call the clinic with any problems, questions or concerns.       [name], MD 01/06/2016 11:52 AM

## 2016-01-25 ENCOUNTER — Ambulatory Visit (INDEPENDENT_AMBULATORY_CARE_PROVIDER_SITE_OTHER): Payer: Medicare HMO | Admitting: Obstetrics and Gynecology

## 2016-01-25 ENCOUNTER — Encounter: Payer: Self-pay | Admitting: Obstetrics and Gynecology

## 2016-01-25 VITALS — BP 138/76 | HR 71 | Ht 60.0 in | Wt 136.0 lb

## 2016-01-25 DIAGNOSIS — N8111 Cystocele, midline: Secondary | ICD-10-CM | POA: Diagnosis not present

## 2016-01-25 DIAGNOSIS — N952 Postmenopausal atrophic vaginitis: Secondary | ICD-10-CM

## 2016-01-25 DIAGNOSIS — Z4689 Encounter for fitting and adjustment of other specified devices: Secondary | ICD-10-CM | POA: Diagnosis not present

## 2016-01-25 DIAGNOSIS — R339 Retention of urine, unspecified: Secondary | ICD-10-CM

## 2016-01-25 NOTE — Progress Notes (Signed)
Chief complaint: 1. Pessary maintenance (last visit 11/02/2015) 2.  History of  multidrug resistant UTI   Here for 3 month follow-up.  Past medical history, past surgical history, problem list, medications, and allergies are reviewed  OBJECTIVE: BP 138/76   Pulse 71   Ht 5' (1.524 m)   Wt 136 lb (61.7 kg)   LMP  (Exact Date)   BMI 26.56 kg/m  ABDOMEN: Soft, non distended; Non tender. No Organomegaly. shoddy adenopathy today with several nodes  Palpable measuring 2 x 3 cm-stable PELVIC External Genitalia: urethral caruncle BUS: Normal  Vagina: moderate atrophy, vaginal cuff intact Cervix: surgically absent Uterus: surgically absent Adnexa: no palpable masses RV: Normal external exam; palpable stool through the vaginal septum noted Bladder: Nontender  ASSESSMENT: 1. Cystocele, grade 2, stable 2. Pessary maintenance, completed, with pessary being removed, cleaned, and reinserted 3. Incomplete bladder emptying, improved 4. Vaginal atrophy, stable 5. 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 2. Return in 3 months for follow-up 3. No intravaginal medicattions are being used.  A total of 15 minutes were spent face-to-face with the patient during this encounter and over half of that time dealt with counseling and coordination of care.  Brayton Mars, MD  Note: This dictation was prepared with Dragon dictation along with smaller phrase technology. Any transcriptional errors that result from this process are unintentional.

## 2016-01-25 NOTE — Patient Instructions (Signed)
1.  Return in 3 months for pessary maintenance 

## 2016-01-26 ENCOUNTER — Inpatient Hospital Stay (HOSPITAL_BASED_OUTPATIENT_CLINIC_OR_DEPARTMENT_OTHER): Payer: Medicare HMO | Admitting: Oncology

## 2016-01-26 ENCOUNTER — Inpatient Hospital Stay: Payer: Medicare HMO

## 2016-01-26 ENCOUNTER — Ambulatory Visit: Payer: Medicare HMO

## 2016-01-26 ENCOUNTER — Encounter: Payer: Self-pay | Admitting: Oncology

## 2016-01-26 VITALS — BP 121/81 | HR 71 | Temp 97.3°F | Resp 18 | Wt 135.1 lb

## 2016-01-26 VITALS — BP 126/77 | HR 60

## 2016-01-26 DIAGNOSIS — Z9221 Personal history of antineoplastic chemotherapy: Secondary | ICD-10-CM

## 2016-01-26 DIAGNOSIS — Z7982 Long term (current) use of aspirin: Secondary | ICD-10-CM

## 2016-01-26 DIAGNOSIS — C5701 Malignant neoplasm of right fallopian tube: Secondary | ICD-10-CM

## 2016-01-26 DIAGNOSIS — C774 Secondary and unspecified malignant neoplasm of inguinal and lower limb lymph nodes: Secondary | ICD-10-CM

## 2016-01-26 DIAGNOSIS — R600 Localized edema: Secondary | ICD-10-CM

## 2016-01-26 DIAGNOSIS — I341 Nonrheumatic mitral (valve) prolapse: Secondary | ICD-10-CM

## 2016-01-26 DIAGNOSIS — Z8543 Personal history of malignant neoplasm of ovary: Secondary | ICD-10-CM

## 2016-01-26 DIAGNOSIS — Z923 Personal history of irradiation: Secondary | ICD-10-CM

## 2016-01-26 DIAGNOSIS — I252 Old myocardial infarction: Secondary | ICD-10-CM

## 2016-01-26 DIAGNOSIS — K219 Gastro-esophageal reflux disease without esophagitis: Secondary | ICD-10-CM

## 2016-01-26 DIAGNOSIS — Z5112 Encounter for antineoplastic immunotherapy: Secondary | ICD-10-CM | POA: Diagnosis not present

## 2016-01-26 DIAGNOSIS — Z79899 Other long term (current) drug therapy: Secondary | ICD-10-CM

## 2016-01-26 DIAGNOSIS — Z86718 Personal history of other venous thrombosis and embolism: Secondary | ICD-10-CM

## 2016-01-26 LAB — COMPREHENSIVE METABOLIC PANEL
ALT: 16 U/L (ref 14–54)
AST: 30 U/L (ref 15–41)
Albumin: 3.5 g/dL (ref 3.5–5.0)
Alkaline Phosphatase: 49 U/L (ref 38–126)
Anion gap: 6 (ref 5–15)
BILIRUBIN TOTAL: 0.5 mg/dL (ref 0.3–1.2)
BUN: 21 mg/dL — AB (ref 6–20)
CALCIUM: 8.7 mg/dL — AB (ref 8.9–10.3)
CO2: 27 mmol/L (ref 22–32)
CREATININE: 0.77 mg/dL (ref 0.44–1.00)
Chloride: 104 mmol/L (ref 101–111)
Glucose, Bld: 85 mg/dL (ref 65–99)
Potassium: 3.8 mmol/L (ref 3.5–5.1)
Sodium: 137 mmol/L (ref 135–145)
TOTAL PROTEIN: 6.9 g/dL (ref 6.5–8.1)

## 2016-01-26 LAB — CBC WITH DIFFERENTIAL/PLATELET
BASOS ABS: 0 10*3/uL (ref 0–0.1)
Basophils Relative: 1 %
EOS PCT: 1 %
Eosinophils Absolute: 0 10*3/uL (ref 0–0.7)
HEMATOCRIT: 35.3 % (ref 35.0–47.0)
Hemoglobin: 11.7 g/dL — ABNORMAL LOW (ref 12.0–16.0)
LYMPHS ABS: 1.2 10*3/uL (ref 1.0–3.6)
LYMPHS PCT: 29 %
MCH: 29.9 pg (ref 26.0–34.0)
MCHC: 33 g/dL (ref 32.0–36.0)
MCV: 90.5 fL (ref 80.0–100.0)
MONO ABS: 0.6 10*3/uL (ref 0.2–0.9)
Monocytes Relative: 14 %
NEUTROS ABS: 2.2 10*3/uL (ref 1.4–6.5)
Neutrophils Relative %: 55 %
PLATELETS: 359 10*3/uL (ref 150–440)
RBC: 3.91 MIL/uL (ref 3.80–5.20)
RDW: 15 % — ABNORMAL HIGH (ref 11.5–14.5)
WBC: 4 10*3/uL (ref 3.6–11.0)

## 2016-01-26 LAB — URINALYSIS, COMPLETE (UACMP) WITH MICROSCOPIC
Bilirubin Urine: NEGATIVE
Glucose, UA: NEGATIVE mg/dL
Hgb urine dipstick: NEGATIVE
KETONES UR: NEGATIVE mg/dL
Nitrite: NEGATIVE
PH: 6 (ref 5.0–8.0)
PROTEIN: NEGATIVE mg/dL
Specific Gravity, Urine: 1.014 (ref 1.005–1.030)

## 2016-01-26 MED ORDER — HEPARIN SOD (PORK) LOCK FLUSH 100 UNIT/ML IV SOLN
INTRAVENOUS | Status: AC
  Filled 2016-01-26: qty 5

## 2016-01-26 MED ORDER — BEVACIZUMAB CHEMO INJECTION 400 MG/16ML
10.0000 mg/kg | Freq: Once | INTRAVENOUS | Status: AC
  Administered 2016-01-26: 625 mg via INTRAVENOUS
  Filled 2016-01-26: qty 16

## 2016-01-26 MED ORDER — HEPARIN SOD (PORK) LOCK FLUSH 100 UNIT/ML IV SOLN
500.0000 [IU] | Freq: Once | INTRAVENOUS | Status: AC | PRN
  Administered 2016-01-26: 500 [IU]

## 2016-01-26 MED ORDER — SODIUM CHLORIDE 0.9 % IJ SOLN
10.0000 mL | INTRAMUSCULAR | Status: DC | PRN
  Administered 2016-01-26: 10 mL
  Filled 2016-01-26: qty 10

## 2016-01-26 MED ORDER — SODIUM CHLORIDE 0.9 % IV SOLN
Freq: Once | INTRAVENOUS | Status: AC
  Administered 2016-01-26: 11:00:00 via INTRAVENOUS
  Filled 2016-01-26: qty 1000

## 2016-01-26 NOTE — Progress Notes (Signed)
Patient is here for follow up, she is doing well 

## 2016-01-26 NOTE — Progress Notes (Signed)
Hematology/Oncology Consult note [facility]  Telephone:([phone] [phone]  Patient Care Team: Ezequiel Kayser, [name]   Name of the patient: Nicole Tapia  NP:[id number]  Oct 21, 1928   Date of visit: 01/26/16  Diagnosis- recurrent stage IIIc adenocarcinoma of the fallopian tube with metastases to the inguinal lymph node  Chief complaint/ Reason for visit- routine on treatment assessment for Avastin for recurrent adenocarcinoma of the fallopian tube  Heme/Onc history:   . 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  Adequate TRS, no macroscopic residual. IP/IV chemotherapy with DDP and paclitaxel on GOG protocol, chemo and Bev consolidation completed in 03/2011 2. 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 November 14, 2011- Patient was started on carboplatin and gemcitabine 4. Finished 6 cycles of chemotherapy in April of 2014 with carboplatin and gemcitabine. Tolerance was fairly good except for neutropenia and thrombocytopenia. 5.recurrent disease by CT scan February of 2015  # .radiation therapy to pelvis  May of 2015  7.upper extremity  . and deep vein thrombosis associated with port.(June of 2015) patient started on   [place] had port removed and had   thrombectomy September 06, 2013. # CT DEC 2016- progressing disease in the right inguinal area # FEB-TAXOL-AVASTIN;  # FEB 2017- AVASTIN q 3W; July 2017- CT- Improved  Right Inguinal LN; STOP AVASTIN sec to potential concerns of AEs  # AUG 2017 3rd- START ZEJULA- declines sec to intol  # OCT 7th CT- STABLE RIGHT INGUINAL LN- RE-START AVASTIN q 3W       Interval history- She is tolerating her treatment well and reports no complaints today.  ECOG PS- 2 Pain scale- 0 Opioid associated constipation- no  Review of systems- Review of Systems  Constitutional: Negative for  chills, fever, malaise/fatigue and weight loss.  HENT: Negative for congestion, ear discharge and nosebleeds.   Eyes: Negative for blurred vision.  Respiratory: Negative for cough, hemoptysis, sputum production, shortness of breath and wheezing.   Cardiovascular: Positive for leg swelling (Chronic right lower extremity swelling). Negative for chest pain, palpitations, orthopnea and claudication.  Gastrointestinal: Negative for abdominal pain, blood in stool, constipation, diarrhea, heartburn, melena, nausea and vomiting.  Genitourinary: Negative for dysuria, flank pain, frequency, hematuria and urgency.  Musculoskeletal: Negative for back pain, joint pain and myalgias.  Skin: Negative for rash.  Neurological: Negative for dizziness, tingling, focal weakness, seizures, weakness and headaches.  Endo/Heme/Allergies: Does not bruise/bleed easily.  Psychiatric/Behavioral: Negative for depression and suicidal ideas. The patient does not have insomnia.      Current treatment- Avastin  Allergies  Allergen Reactions  . Benadryl [Diphenhydramine] Shortness Of Breath    Sob, rash, swelling  . Tamsulosin Swelling  . Alendronate Sodium     Other reaction(s): Other (See Comments) Dysphagia  . Duloxetine Hcl Diarrhea  . Risedronate Sodium Rash    Aching, dysphagia  . Lovastatin     Other reaction(s): Other (See Comments) GI upset  . Sulfa Antibiotics Rash    States she can take but leave as allergy     Past Medical History:  Diagnosis Date  . Bladder infection, chronic 10/19/2011  . Cancer (HCC)    Ovarian   . Carcinoma of fallopian tube ([place]) 10/05/2009   Overview:  Overview:  Overview:  Drs. Claiborne Rigg and Delorise Shiner Choksi Overview:  Drs. Claiborne Rigg and [employer]   . GERD (gastroesophageal reflux disease)   .  MI (mitral incompetence) 10/27/2014   Overview:  MODERATE   . Mitral valve prolapse   . OAB (overactive bladder)   . Tachycardia      Past Surgical History:  Procedure  Laterality Date  . ABDOMINAL HYSTERECTOMY    . CHOLECYSTECTOMY    . LAPAROSCOPIC BILATERAL SALPINGO OOPHERECTOMY  2011  . ovarian cancer surgery  2011    Social History   Social History  . Marital status: Married    Spouse name: N/A  . Number of children: N/A  . Years of education: N/A   Occupational History  . Not on file.   Social History Main Topics  . Smoking status: Never Smoker  . Smokeless tobacco: Never Used  . Alcohol use No  . Drug use: No  . Sexual activity: Not Currently    Birth control/ protection: Surgical   Other Topics Concern  . Not on file   Social History Narrative  . No narrative on file    Family History  Problem Relation Age of Onset  . Ovarian cancer Other   . Breast cancer Neg Hx   . Colon cancer Neg Hx   . Diabetes Neg Hx   . Heart disease Neg Hx   . Kidney disease Neg Hx   . Bladder Cancer Neg Hx      Current Outpatient Prescriptions:  .  acetaminophen (TYLENOL) 500 MG tablet, Take 1 tablet (500 mg total) by mouth every 6 (six) hours as needed., Disp: 30 tablet, Rfl: 0 .  Ascorbic Acid (VITAMIN C) 100 MG tablet, Take 100 mg by mouth daily., Disp: , Rfl:  .  aspirin EC 81 MG tablet, Take 81 mg by mouth., Disp: , Rfl:  .  cetirizine (ZYRTEC) 10 MG tablet, Take 10 mg by mouth daily. , Disp: , Rfl:  .  Cholecalciferol (VITAMIN D3) 2000 UNITS capsule, Take 2,000 Units by mouth daily. , Disp: , Rfl:  .  docusate sodium (COLACE) 100 MG capsule, Take 100 mg by mouth., Disp: , Rfl:  .  fluticasone (FLONASE) 50 MCG/ACT nasal spray, Place 1 spray into both nostrils daily. , Disp: , Rfl:  .  gabapentin (NEURONTIN) 600 MG tablet, Take 1 tablet (600 mg total) by mouth 3 (three) times daily., Disp: 180 tablet, Rfl: 3 .  metoprolol succinate (TOPROL-XL) 50 MG 24 hr tablet, Take 50 mg by mouth daily. , Disp: , Rfl:  .  metoprolol succinate (TOPROL-XL) 50 MG 24 hr tablet, , Disp: , Rfl:  .  nitrofurantoin, macrocrystal-monohydrate, (MACROBID) 100 MG  capsule, Take 1 capsule (100 mg total) by mouth 2 (two) times daily. (Patient taking differently: Take 100 mg by mouth daily. ), Disp: 30 capsule, Rfl: 3 .  nystatin cream (MYCOSTATIN), Apply 1 application topically 2 (two) times daily., Disp: 30 g, Rfl: 0 .  nystatin ointment (MYCOSTATIN), Apply 1 application topically 2 (two) times daily., Disp: 30 g, Rfl: 0 .  ondansetron (ZOFRAN) 4 MG tablet, TAKE (1) TABLET BY MOUTH EVERY 8 HOURS AS NEEDED FOR NAUSEA/VOMITING, Disp: 30 tablet, Rfl: 3 .  pantoprazole (PROTONIX) 40 MG tablet, Take 40 mg by mouth., Disp: , Rfl:  .  triamcinolone ointment (KENALOG) 0.1 %, Apply 1 application topically 2 (two) times daily., Disp: 30 g, Rfl: 1 No current facility-administered medications for this visit.   Facility-Administered Medications Ordered in Other Visits:  .  diphenhydrAMINE (BENADRYL) injection 50 mg, 50 mg, Intravenous, Once, Forest Gleason, MD .  sodium chloride 0.9 % injection 10 mL, 10  mL, Intravenous, PRN, Forest Gleason, MD, 10 mL at 01/12/15 1116 .  sodium chloride 0.9 % injection 10 mL, 10 mL, Intravenous, PRN, Forest Gleason, MD, 10 mL at 02/16/15 1025  Physical exam:  Vitals:   01/26/16 0952  BP: 121/81  Pulse: 71  Resp: 18  Temp: 97.3 F (36.3 C)  TempSrc: Tympanic  Weight: 135 lb 2.3 oz (61.3 kg)   Physical Exam  Constitutional: She is oriented to person, place, and time and well-developed, well-nourished, and in no distress.  HENT:  Head: Normocephalic and atraumatic.  Eyes: EOM are normal. Pupils are equal, round, and reactive to light.  Neck: Normal range of motion.  Cardiovascular: Normal rate, regular rhythm and normal heart sounds.   Pulmonary/Chest: Effort normal and breath sounds normal.  Abdominal: Soft. Bowel sounds are normal.  Palpable right inguinal adenopathy  Musculoskeletal: She exhibits edema (Last one chronic right lower extremity edema).  Neurological: She is alert and oriented to person, place, and time.  Skin:  Skin is warm and dry.     CMP Latest Ref Rng & Units 01/05/2016  Glucose 65 - 99 mg/dL 78  BUN 6 - 20 mg/dL 24(H)  Creatinine 0.44 - 1.00 mg/dL 0.88  Sodium 135 - 145 mmol/L 138  Potassium 3.5 - 5.1 mmol/L 3.6  Chloride 101 - 111 mmol/L 104  CO2 22 - 32 mmol/L 27  Calcium 8.9 - 10.3 mg/dL 8.9  Total Protein 6.5 - 8.1 g/dL 7.0  Total Bilirubin 0.3 - 1.2 mg/dL 0.6  Alkaline Phos 38 - 126 U/L 46  AST 15 - 41 U/L 25  ALT 14 - 54 U/L 13(L)   CBC Latest Ref Rng & Units 01/05/2016  WBC 3.6 - 11.0 K/uL 4.3  Hemoglobin 12.0 - 16.0 g/dL 11.6(L)  Hematocrit 35.0 - 47.0 % 34.9(L)  Platelets 150 - 440 K/uL 373      Assessment and plan- Patient is a 82 y.o. female with a history of recurrent adenocarcinoma of the fallopian tube now metastatic to the inguinal lymph node currently on Avastin  1. Counts are okay to proceed with the next cycle of Avastin today. Her blood pressure is stable and urine analysis reveals no proteinuria. We will order CT chest abdomen and pelvis 1 week from now and she will see Dr. Rogue Bussing 3 weeks from now with a CBC, CMP, UA and possible Avastin    Visit Diagnosis 1. Malignant neoplasm of right fallopian tube [facility])      Dr. Randa Evens, MD, MPH [facility] at [facility] Pager- [phone] 01/26/2016 8:19 AM

## 2016-02-09 ENCOUNTER — Ambulatory Visit: Payer: Medicare HMO

## 2016-02-15 ENCOUNTER — Ambulatory Visit
Admission: RE | Admit: 2016-02-15 | Discharge: 2016-02-15 | Disposition: A | Discharge status: Home / Self Care | Payer: Medicare HMO | Source: Ambulatory Visit | Attending: Oncology | Admitting: Oncology

## 2016-02-15 DIAGNOSIS — K449 Diaphragmatic hernia without obstruction or gangrene: Secondary | ICD-10-CM | POA: Diagnosis not present

## 2016-02-15 DIAGNOSIS — I700 Atherosclerosis of aorta: Secondary | ICD-10-CM | POA: Diagnosis not present

## 2016-02-15 DIAGNOSIS — C7989 Secondary malignant neoplasm of other specified sites: Secondary | ICD-10-CM | POA: Insufficient documentation

## 2016-02-15 DIAGNOSIS — K862 Cyst of pancreas: Secondary | ICD-10-CM | POA: Diagnosis not present

## 2016-02-15 DIAGNOSIS — C5701 Malignant neoplasm of right fallopian tube: Secondary | ICD-10-CM | POA: Diagnosis present

## 2016-02-15 DIAGNOSIS — C779 Secondary and unspecified malignant neoplasm of lymph node, unspecified: Secondary | ICD-10-CM | POA: Diagnosis not present

## 2016-02-15 MED ORDER — IOPAMIDOL (ISOVUE-300) INJECTION 61%
75.0000 mL | Freq: Once | INTRAVENOUS | Status: AC | PRN
  Administered 2016-02-15: 75 mL via INTRAVENOUS

## 2016-02-16 ENCOUNTER — Inpatient Hospital Stay: Payer: Medicare HMO | Attending: Internal Medicine | Admitting: Internal Medicine

## 2016-02-16 ENCOUNTER — Inpatient Hospital Stay: Payer: Medicare HMO

## 2016-02-16 VITALS — BP 114/65 | HR 69 | Temp 98.7°F | Wt 134.0 lb

## 2016-02-16 DIAGNOSIS — Z79899 Other long term (current) drug therapy: Secondary | ICD-10-CM | POA: Insufficient documentation

## 2016-02-16 DIAGNOSIS — I700 Atherosclerosis of aorta: Secondary | ICD-10-CM | POA: Diagnosis not present

## 2016-02-16 DIAGNOSIS — Z86718 Personal history of other venous thrombosis and embolism: Secondary | ICD-10-CM | POA: Insufficient documentation

## 2016-02-16 DIAGNOSIS — C774 Secondary and unspecified malignant neoplasm of inguinal and lower limb lymph nodes: Secondary | ICD-10-CM | POA: Diagnosis not present

## 2016-02-16 DIAGNOSIS — K219 Gastro-esophageal reflux disease without esophagitis: Secondary | ICD-10-CM | POA: Diagnosis not present

## 2016-02-16 DIAGNOSIS — N3281 Overactive bladder: Secondary | ICD-10-CM | POA: Insufficient documentation

## 2016-02-16 DIAGNOSIS — Z9221 Personal history of antineoplastic chemotherapy: Secondary | ICD-10-CM | POA: Diagnosis not present

## 2016-02-16 DIAGNOSIS — C5701 Malignant neoplasm of right fallopian tube: Secondary | ICD-10-CM

## 2016-02-16 DIAGNOSIS — Z923 Personal history of irradiation: Secondary | ICD-10-CM | POA: Insufficient documentation

## 2016-02-16 DIAGNOSIS — M4316 Spondylolisthesis, lumbar region: Secondary | ICD-10-CM | POA: Insufficient documentation

## 2016-02-16 DIAGNOSIS — K449 Diaphragmatic hernia without obstruction or gangrene: Secondary | ICD-10-CM | POA: Diagnosis not present

## 2016-02-16 DIAGNOSIS — Z7901 Long term (current) use of anticoagulants: Secondary | ICD-10-CM | POA: Diagnosis not present

## 2016-02-16 DIAGNOSIS — I341 Nonrheumatic mitral (valve) prolapse: Secondary | ICD-10-CM | POA: Diagnosis not present

## 2016-02-16 DIAGNOSIS — R590 Localized enlarged lymph nodes: Secondary | ICD-10-CM | POA: Insufficient documentation

## 2016-02-16 DIAGNOSIS — Z5112 Encounter for antineoplastic immunotherapy: Secondary | ICD-10-CM | POA: Insufficient documentation

## 2016-02-16 DIAGNOSIS — Z792 Long term (current) use of antibiotics: Secondary | ICD-10-CM | POA: Diagnosis not present

## 2016-02-16 DIAGNOSIS — R600 Localized edema: Secondary | ICD-10-CM | POA: Insufficient documentation

## 2016-02-16 DIAGNOSIS — Z7982 Long term (current) use of aspirin: Secondary | ICD-10-CM | POA: Diagnosis not present

## 2016-02-16 DIAGNOSIS — Z87440 Personal history of urinary (tract) infections: Secondary | ICD-10-CM | POA: Diagnosis not present

## 2016-02-16 DIAGNOSIS — R490 Dysphonia: Secondary | ICD-10-CM | POA: Diagnosis not present

## 2016-02-16 DIAGNOSIS — Z90710 Acquired absence of both cervix and uterus: Secondary | ICD-10-CM | POA: Diagnosis not present

## 2016-02-16 DIAGNOSIS — Z9049 Acquired absence of other specified parts of digestive tract: Secondary | ICD-10-CM

## 2016-02-16 LAB — CBC WITH DIFFERENTIAL/PLATELET
BASOS ABS: 0.1 10*3/uL (ref 0–0.1)
BASOS PCT: 1 %
EOS ABS: 0.1 10*3/uL (ref 0–0.7)
EOS PCT: 1 %
HCT: 37.4 % (ref 35.0–47.0)
Hemoglobin: 12.3 g/dL (ref 12.0–16.0)
Lymphocytes Relative: 21 %
Lymphs Abs: 1.4 10*3/uL (ref 1.0–3.6)
MCH: 29.8 pg (ref 26.0–34.0)
MCHC: 32.8 g/dL (ref 32.0–36.0)
MCV: 90.7 fL (ref 80.0–100.0)
MONO ABS: 0.7 10*3/uL (ref 0.2–0.9)
MONOS PCT: 10 %
Neutro Abs: 4.5 10*3/uL (ref 1.4–6.5)
Neutrophils Relative %: 67 %
PLATELETS: 399 10*3/uL (ref 150–440)
RBC: 4.12 MIL/uL (ref 3.80–5.20)
RDW: 15.5 % — AB (ref 11.5–14.5)
WBC: 6.6 10*3/uL (ref 3.6–11.0)

## 2016-02-16 LAB — URINALYSIS, COMPLETE (UACMP) WITH MICROSCOPIC
Bilirubin Urine: NEGATIVE
GLUCOSE, UA: NEGATIVE mg/dL
KETONES UR: NEGATIVE mg/dL
Nitrite: NEGATIVE
PROTEIN: NEGATIVE mg/dL
Specific Gravity, Urine: 1.014 (ref 1.005–1.030)
pH: 6 (ref 5.0–8.0)

## 2016-02-16 LAB — COMPREHENSIVE METABOLIC PANEL
ALBUMIN: 3.6 g/dL (ref 3.5–5.0)
ALT: 14 U/L (ref 14–54)
ANION GAP: 7 (ref 5–15)
AST: 28 U/L (ref 15–41)
Alkaline Phosphatase: 45 U/L (ref 38–126)
BILIRUBIN TOTAL: 0.5 mg/dL (ref 0.3–1.2)
BUN: 16 mg/dL (ref 6–20)
CHLORIDE: 105 mmol/L (ref 101–111)
CO2: 27 mmol/L (ref 22–32)
Calcium: 9.1 mg/dL (ref 8.9–10.3)
Creatinine, Ser: 0.88 mg/dL (ref 0.44–1.00)
GFR calc Af Amer: >60 mL/min (ref >60)
GFR calc non Af Amer: 58 mL/min — ABNORMAL LOW (ref >60)
GLUCOSE: 112 mg/dL — AB (ref 65–99)
POTASSIUM: 4.2 mmol/L (ref 3.5–5.1)
Sodium: 139 mmol/L (ref 135–145)
TOTAL PROTEIN: 6.8 g/dL (ref 6.5–8.1)

## 2016-02-16 MED ORDER — HEPARIN SOD (PORK) LOCK FLUSH 100 UNIT/ML IV SOLN
500.0000 [IU] | Freq: Once | INTRAVENOUS | Status: AC | PRN
  Administered 2016-02-16: 500 [IU]
  Filled 2016-02-16: qty 5

## 2016-02-16 MED ORDER — SODIUM CHLORIDE 0.9 % IV SOLN
Freq: Once | INTRAVENOUS | Status: AC
  Administered 2016-02-16: 11:00:00 via INTRAVENOUS
  Filled 2016-02-16: qty 1000

## 2016-02-16 MED ORDER — SODIUM CHLORIDE 0.9 % IV SOLN
10.0000 mg/kg | Freq: Once | INTRAVENOUS | Status: AC
  Administered 2016-02-16: 625 mg via INTRAVENOUS
  Filled 2016-02-16: qty 16

## 2016-02-16 NOTE — Progress Notes (Signed)
[place] OFFICE PROGRESS NOTE  Patient Care Team: Ezequiel Kayser, [name]  No matching staging information was found for the patient.   Oncology History   . 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  Adequate TRS, no macroscopic residual. IP/IV chemotherapy with DDP and paclitaxel on GOG protocol, chemo and Bev consolidation completed in 03/2011 2. 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 November 14, 2011- Patient was started on carboplatin and gemcitabine 4. Finished 6 cycles of chemotherapy in April of 2014 with carboplatin and gemcitabine. Tolerance was fairly good except for neutropenia and thrombocytopenia. 5.recurrent disease by CT scan February of 2015  # .radiation therapy to pelvis  May of 2015  7.upper extremity  . and deep vein thrombosis associated with port.(June of 2015) patient started on   [place] had port removed and had   thrombectomy September 06, 2013. # CT DEC 2016- progressing disease in the right inguinal area # FEB-TAXOL-AVASTIN;  # FEB 2017- AVASTIN q 3W; July 2017- CT- Improved  Right Inguinal LN; STOP AVASTIN sec to potential concerns of AEs  # AUG 2017 3rd- START ZEJULA- declines sec to intol  # OCT 7th CT- STABLE RIGHT INGUINAL LN- RE-START AVASTIN q 3W       Malignant neoplasm of right fallopian tube ([place])   10/05/2009 Initial Diagnosis    Carcinoma of fallopian tube        INTERVAL HISTORY:  Nicole Clayton 83 y.o.  female pleasant patient above history of Fallopian tube cancer metastatic currently  Avastin is here for follow-up/ To review the results of the restaging CAT scan.   Denies any epistaxis. Denies any unusual headaches. Denies any shortness of breath or headaches or epistaxis. She continues to chronic swelling in the left leg. Otherwise no weight loss or abdominal bloating. Otherwise no fever.Marland Kitchen Appetite  is fair.   REVIEW OF SYSTEMS:  A complete 10 point review of system is done which is negative except mentioned above/history of present illness. Complains of hoarseness of voice. She has history of chronic UTIs on Macrobid prophylaxis.  PAST MEDICAL HISTORY :  Past Medical History:  Diagnosis Date  . Bladder infection, chronic 10/19/2011  . Cancer (HCC)    Ovarian   . Carcinoma of fallopian tube ([place]) 10/05/2009   Overview:  Overview:  Overview:  Drs. Claiborne Rigg and Delorise Shiner Choksi Overview:  Drs. Claiborne Rigg and [employer]   . GERD (gastroesophageal reflux disease)   . MI (mitral incompetence) 10/27/2014   Overview:  MODERATE   . Mitral valve prolapse   . OAB (overactive bladder)   . Tachycardia     PAST SURGICAL HISTORY :   Past Surgical History:  Procedure Laterality Date  . ABDOMINAL HYSTERECTOMY    . CHOLECYSTECTOMY    . LAPAROSCOPIC BILATERAL SALPINGO OOPHERECTOMY  2011  . ovarian cancer surgery  2011    FAMILY HISTORY :   Family History  Problem Relation Age of Onset  . Ovarian cancer Other   . Breast cancer Neg Hx   . Colon cancer Neg Hx   . Diabetes Neg Hx   . Heart disease Neg Hx   . Kidney disease Neg Hx   . Bladder Cancer Neg Hx     SOCIAL HISTORY:   Social History  Substance Use Topics  . Smoking status: Never Smoker  . Smokeless tobacco: Never Used  . Alcohol use  No    ALLERGIES:  is allergic to benadryl [diphenhydramine]; tamsulosin; alendronate sodium; duloxetine hcl; risedronate sodium; lovastatin; and sulfa antibiotics.  MEDICATIONS:  Current Outpatient Prescriptions  Medication Sig Dispense Refill  . acetaminophen (TYLENOL) 500 MG tablet Take 1 tablet (500 mg total) by mouth every 6 (six) hours as needed. 30 tablet 0  . Ascorbic Acid (VITAMIN C) 100 MG tablet Take 100 mg by mouth daily.    Marland Kitchen aspirin EC 81 MG tablet Take 81 mg by mouth.    . cetirizine (ZYRTEC) 10 MG tablet Take 10 mg by mouth daily.     . Cholecalciferol (VITAMIN D3) 2000  UNITS capsule Take 2,000 Units by mouth daily.     . fluticasone (FLONASE) 50 MCG/ACT nasal spray Place 1 spray into both nostrils daily.     Marland Kitchen gabapentin (NEURONTIN) 600 MG tablet Take 1 tablet (600 mg total) by mouth 3 (three) times daily. 180 tablet 3  . nitrofurantoin, macrocrystal-monohydrate, (MACROBID) 100 MG capsule Take 1 capsule (100 mg total) by mouth 2 (two) times daily. (Patient taking differently: Take 100 mg by mouth daily. ) 30 capsule 3  . nystatin cream (MYCOSTATIN) Apply 1 application topically 2 (two) times daily. 30 g 0  . nystatin ointment (MYCOSTATIN) Apply 1 application topically 2 (two) times daily. 30 g 0  . ondansetron (ZOFRAN) 4 MG tablet TAKE (1) TABLET BY MOUTH EVERY 8 HOURS AS NEEDED FOR NAUSEA/VOMITING 30 tablet 3  . pantoprazole (PROTONIX) 40 MG tablet Take 40 mg by mouth.    . triamcinolone ointment (KENALOG) 0.1 % Apply 1 application topically 2 (two) times daily. 30 g 1  . docusate sodium (COLACE) 100 MG capsule Take 100 mg by mouth.     No current facility-administered medications for this visit.    Facility-Administered Medications Ordered in Other Visits  Medication Dose Route Frequency Provider Last Rate Last Dose  . diphenhydrAMINE (BENADRYL) injection 50 mg  50 mg Intravenous Once Forest Gleason, MD      . sodium chloride 0.9 % injection 10 mL  10 mL Intravenous PRN Forest Gleason, MD   10 mL at 01/12/15 1116  . sodium chloride 0.9 % injection 10 mL  10 mL Intravenous PRN Forest Gleason, MD   10 mL at 02/16/15 1025    PHYSICAL EXAMINATION: ECOG PERFORMANCE STATUS: 2 - Symptomatic, <50% confined to bed  BP 114/65 (BP Location: Left Arm, Patient Position: Sitting)   Pulse 69   Temp 98.7 F (37.1 C) (Tympanic)   Wt 134 lb (60.8 kg)   LMP  (Exact Date)   BMI 26.17 kg/m   Filed Weights   02/16/16 0945  Weight: 134 lb (60.8 kg)    GENERAL: Well-nourished well-developed; Alert, no distress and comfortable.  She is walking herself. Accompanied by  daughter. EYES: no pallor or icterus OROPHARYNX: no thrush or ulceration; good dentition  NECK: supple, no masses felt LYMPH:  no palpable lymphadenopathy in the cervical, axillary; Right ingiunal LN ~2-3cm in size.  LUNGS: clear to auscultation and  No wheeze or crackles HEART/CVS: regular rate & rhythm and no murmurs; chronic right lower extremity swelling ABDOMEN:abdomen soft, non-tender and normal bowel sounds Musculoskeletal:no cyanosis of digits and no clubbing  PSYCH: alert & oriented x 3 with fluent speech NEURO: no focal motor/sensory deficits SKIN:  no rashes or significant lesions  LABORATORY DATA:  I have reviewed the data as listed    Component Value Date/Time   NA 139 02/16/2016 0902   NA 138  06/02/2014 0910   K 4.2 02/16/2016 0902   K 3.7 06/02/2014 0910   CL 105 02/16/2016 0902   CL 107 06/02/2014 0910   CO2 27 02/16/2016 0902   CO2 26 06/02/2014 0910   GLUCOSE 112 (H) 02/16/2016 0902   GLUCOSE 106 (H) 06/02/2014 0910   BUN 16 02/16/2016 0902   BUN 17 06/02/2014 0910   CREATININE 0.88 02/16/2016 0902   CREATININE 1.00 06/02/2014 0910   CALCIUM 9.1 02/16/2016 0902   CALCIUM 8.6 (L) 06/02/2014 0910   PROT 6.8 02/16/2016 0902   PROT 6.3 (L) 06/02/2014 0910   ALBUMIN 3.6 02/16/2016 0902   ALBUMIN 3.4 (L) 06/02/2014 0910   AST 28 02/16/2016 0902   AST 23 06/02/2014 0910   ALT 14 02/16/2016 0902   ALT 10 (L) 06/02/2014 0910   ALKPHOS 45 02/16/2016 0902   ALKPHOS 46 06/02/2014 0910   BILITOT 0.5 02/16/2016 0902   BILITOT 0.5 06/02/2014 0910   GFRNONAA 58 (L) 02/16/2016 0902   GFRNONAA 52 (L) 06/02/2014 0910   GFRAA >60 02/16/2016 0902   GFRAA >60 06/02/2014 0910    No results found for: SPEP, UPEP  Lab Results  Component Value Date   WBC 6.6 02/16/2016   NEUTROABS 4.5 02/16/2016   HGB 12.3 02/16/2016   HCT 37.4 02/16/2016   MCV 90.7 02/16/2016   PLT 399 02/16/2016      Chemistry      Component Value Date/Time   NA 139 02/16/2016 0902   NA  138 06/02/2014 0910   K 4.2 02/16/2016 0902   K 3.7 06/02/2014 0910   CL 105 02/16/2016 0902   CL 107 06/02/2014 0910   CO2 27 02/16/2016 0902   CO2 26 06/02/2014 0910   BUN 16 02/16/2016 0902   BUN 17 06/02/2014 0910   CREATININE 0.88 02/16/2016 0902   CREATININE 1.00 06/02/2014 0910      Component Value Date/Time   CALCIUM 9.1 02/16/2016 0902   CALCIUM 8.6 (L) 06/02/2014 0910   ALKPHOS 45 02/16/2016 0902   ALKPHOS 46 06/02/2014 0910   AST 28 02/16/2016 0902   AST 23 06/02/2014 0910   ALT 14 02/16/2016 0902   ALT 10 (L) 06/02/2014 0910   BILITOT 0.5 02/16/2016 0902   BILITOT 0.5 06/02/2014 0910       RADIOGRAPHIC STUDIES: I have personally reviewed the radiological images as listed and agreed with the findings in the report. Ct Abdomen Pelvis W Contrast  Result Date: 02/15/2016 CLINICAL DATA:  History of fallopian tube cancer EXAM: CT ABDOMEN AND PELVIS WITH CONTRAST TECHNIQUE: Multidetector CT imaging of the abdomen and pelvis was performed using the standard protocol following bolus administration of intravenous contrast. CONTRAST:  34mL ISOVUE-300 IOPAMIDOL (ISOVUE-300) INJECTION 61% COMPARISON:  11/10/2015 FINDINGS: Lower chest: No pleural fluid identified. Large hiatal hernia identified. Hepatobiliary: There is no suspicious liver abnormality. Previous cholecystectomy with increase caliber of the CBD. This is similar to previous exam measuring up to 1.2 cm. Pancreas: The small cystic lesion within the tail of pancreas measures 8 mm and is not significantly changed from previous exam, image 14 of series 2. Spleen: Small spleen is stable from previous exam. Adrenals/Urinary Tract: Normal appearance of the adrenal glands. Mild bilateral renal cortical scarring identified. No mass or hydronephrosis. The urinary bladder appears normal. Stomach/Bowel: Large hiatal hernia. No pathologic dilatation of the small bowel loops. The colon is scratch set numerous scattered colonic diverticula  identified without acute inflammation. Vascular/Lymphatic: Aortic atherosclerosis identified. No aneurysm.  No abdominal adenopathy. The index right inguinal lymph node has a short axis of 1.7 cm, image 63 of series 2. Previously 2.2 cm. The adjacent lymph node measures 2.1 cm, image 66 of series 2. Unchanged from previous exam. The index right external iliac node measures 1.3 cm, image 55 of series 2. Unchanged from previous exam. Reproductive: Status post hysterectomy. No adnexal masses. A pessary device is identified Other: No abdominal wall hernia or abnormality. No abdominopelvic ascites. Musculoskeletal: The bones appear osteopenic. There is degenerative disc disease within the lumbar spine. Anterolisthesis of L4 on L5 is identified. IMPRESSION: 1. No acute findings identified within the abdomen or pelvis. 2. Stable appearance of right external iliac and right inguinal metastatic adenopathy. 3. Large hiatal hernia. 4. Aortic atherosclerosis. 5. Stable small cystic lesion arising from tail of pancreas. Electronically Signed   By: Kerby Moors M.D.   On: 02/15/2016 09:58     ASSESSMENT & PLAN:  Malignant neoplasm of right fallopian tube [facility]) Metastatic fallopian tube cancer- currently on Avastin since February 2017. JAN 2018-- CT scan- STABLE Lymphadenopathy in right groin. No clinical progression noted.   # proceed with AVASTIN today; labs okay; UA- pending today.   #  Right R swelling-chronic ? Sec to LN-  STABLE.    # 71mm pancreatic lesion- monitor for now.   # Chronic UTIs- on macrobid.   # / follow up in 3 weeks/ cbc/cmp/UA/Avastin.   # I reviewed the blood work- with the patient in detail; also reviewed the imaging independently [as summarized above]; and with the patient in detail.    Orders Placed This Encounter  Procedures  . CBC with Differential    Standing Status:   Future    Standing Expiration Date:   02/15/2017  . Comprehensive metabolic panel    Standing Status:    Future    Standing Expiration Date:   02/15/2017  . Urinalysis, Complete w Microscopic    Standing Status:   Future    Standing Expiration Date:   02/15/2017   All questions were answered. The patient knows to call the clinic with any problems, questions or concerns.      [name], MD 02/16/2016 5:17 PM

## 2016-02-16 NOTE — Progress Notes (Signed)
Patient here today for follow up.  Patient states no new concerns today  

## 2016-02-16 NOTE — Assessment & Plan Note (Addendum)
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 JAN 2018-- CT scan- STABLE Lymphadenopathy in right groin. No clinical progression noted.   # proceed with AVASTIN today; labs okay; UA- pending today.   #  Right R swelling-chronic ? Sec to LN-  STABLE.    # 42mm pancreatic lesion- monitor for now.   # Chronic UTIs- on macrobid.   # / follow up in 3 weeks/ cbc/cmp/UA/Avastin.   # I reviewed the blood work- with the patient in detail; also reviewed the imaging independently [as summarized above]; and with the patient in detail.

## 2016-02-19 ENCOUNTER — Ambulatory Visit (INDEPENDENT_AMBULATORY_CARE_PROVIDER_SITE_OTHER): Payer: Medicare HMO | Admitting: Family Medicine

## 2016-02-19 VITALS — BP 127/71 | HR 65 | Ht 60.0 in | Wt 134.0 lb

## 2016-02-19 DIAGNOSIS — N390 Urinary tract infection, site not specified: Secondary | ICD-10-CM

## 2016-02-19 LAB — URINALYSIS, COMPLETE
BILIRUBIN UA: NEGATIVE
Glucose, UA: NEGATIVE
KETONES UA: NEGATIVE
LEUKOCYTES UA: NEGATIVE
Nitrite, UA: NEGATIVE
PROTEIN UA: NEGATIVE
RBC UA: NEGATIVE
Specific Gravity, UA: 1.015 (ref 1.005–1.030)
Urobilinogen, Ur: 0.2 mg/dL (ref 0.2–1.0)
pH, UA: 6 (ref 5.0–7.5)

## 2016-02-19 LAB — MICROSCOPIC EXAMINATION: Bacteria, UA: NONE SEEN

## 2016-02-19 NOTE — Progress Notes (Signed)
Patient presents today with urinary frequency and nausea, urgency. She complains of low back pain and lower abdominal pain. She has been on continuous Macrobid 100 mg for several years. She saw Dr. B at the Cancer center and he prescribed Nitrofurantoin 100 mg. She complains that this medication does not work for her at all. A cath UA was performed today and will be sent for Culture.

## 2016-02-19 NOTE — Progress Notes (Signed)
In and Out Catheterization  Patient is present today for a I & O catheterization due to recurrent UTI. Patient was cleaned and prepped in a sterile fashion with betadine and Lidocaine 2% jelly was instilled into the urethra.  A 14FR cath was inserted no complications were noted , 60ml of urine return was noted, urine was yellow in color. A clean urine sample was collected for UA, UCX. Bladder was drained  And catheter was removed with out difficulty.    Preformed by: Carvel Huskins, CMA   

## 2016-02-23 ENCOUNTER — Telehealth: Payer: Self-pay

## 2016-02-23 LAB — CULTURE, URINE COMPREHENSIVE

## 2016-02-23 NOTE — Telephone Encounter (Signed)
-----   Message from Nori Riis, PA-C sent at 02/23/2016 11:46 AM EST ----- Patient's urine culture was negative.  If she is still having symptoms, I suggest she have an office visit.

## 2016-02-23 NOTE — Telephone Encounter (Signed)
Spoke with pt daughter in reference to -ucx. Daughter stated that pt had some left over cipro and has been taking them all week. Per daughter pt is feeling much better.

## 2016-03-04 ENCOUNTER — Other Ambulatory Visit: Payer: Self-pay | Admitting: Oncology

## 2016-03-04 DIAGNOSIS — C569 Malignant neoplasm of unspecified ovary: Secondary | ICD-10-CM

## 2016-03-08 ENCOUNTER — Inpatient Hospital Stay (HOSPITAL_BASED_OUTPATIENT_CLINIC_OR_DEPARTMENT_OTHER): Payer: Medicare HMO | Admitting: Internal Medicine

## 2016-03-08 ENCOUNTER — Inpatient Hospital Stay: Payer: Medicare HMO | Attending: Internal Medicine

## 2016-03-08 ENCOUNTER — Inpatient Hospital Stay: Payer: Medicare HMO

## 2016-03-08 ENCOUNTER — Other Ambulatory Visit: Payer: Self-pay | Admitting: Internal Medicine

## 2016-03-08 VITALS — BP 150/96 | HR 81 | Temp 98.6°F | Resp 20

## 2016-03-08 VITALS — BP 151/83

## 2016-03-08 DIAGNOSIS — A084 Viral intestinal infection, unspecified: Secondary | ICD-10-CM | POA: Insufficient documentation

## 2016-03-08 DIAGNOSIS — N3281 Overactive bladder: Secondary | ICD-10-CM | POA: Diagnosis not present

## 2016-03-08 DIAGNOSIS — Z86718 Personal history of other venous thrombosis and embolism: Secondary | ICD-10-CM | POA: Insufficient documentation

## 2016-03-08 DIAGNOSIS — Z79899 Other long term (current) drug therapy: Secondary | ICD-10-CM

## 2016-03-08 DIAGNOSIS — Z5112 Encounter for antineoplastic immunotherapy: Secondary | ICD-10-CM | POA: Insufficient documentation

## 2016-03-08 DIAGNOSIS — C774 Secondary and unspecified malignant neoplasm of inguinal and lower limb lymph nodes: Secondary | ICD-10-CM

## 2016-03-08 DIAGNOSIS — K219 Gastro-esophageal reflux disease without esophagitis: Secondary | ICD-10-CM | POA: Diagnosis not present

## 2016-03-08 DIAGNOSIS — Z8041 Family history of malignant neoplasm of ovary: Secondary | ICD-10-CM | POA: Insufficient documentation

## 2016-03-08 DIAGNOSIS — Z792 Long term (current) use of antibiotics: Secondary | ICD-10-CM | POA: Insufficient documentation

## 2016-03-08 DIAGNOSIS — Z7982 Long term (current) use of aspirin: Secondary | ICD-10-CM

## 2016-03-08 DIAGNOSIS — C5701 Malignant neoplasm of right fallopian tube: Secondary | ICD-10-CM | POA: Diagnosis not present

## 2016-03-08 DIAGNOSIS — I341 Nonrheumatic mitral (valve) prolapse: Secondary | ICD-10-CM

## 2016-03-08 DIAGNOSIS — N390 Urinary tract infection, site not specified: Secondary | ICD-10-CM | POA: Diagnosis not present

## 2016-03-08 DIAGNOSIS — R600 Localized edema: Secondary | ICD-10-CM | POA: Diagnosis not present

## 2016-03-08 DIAGNOSIS — Z9221 Personal history of antineoplastic chemotherapy: Secondary | ICD-10-CM | POA: Insufficient documentation

## 2016-03-08 DIAGNOSIS — Z800 Family history of malignant neoplasm of digestive organs: Secondary | ICD-10-CM | POA: Insufficient documentation

## 2016-03-08 DIAGNOSIS — R490 Dysphonia: Secondary | ICD-10-CM

## 2016-03-08 DIAGNOSIS — I700 Atherosclerosis of aorta: Secondary | ICD-10-CM | POA: Insufficient documentation

## 2016-03-08 DIAGNOSIS — Z87440 Personal history of urinary (tract) infections: Secondary | ICD-10-CM

## 2016-03-08 DIAGNOSIS — K449 Diaphragmatic hernia without obstruction or gangrene: Secondary | ICD-10-CM | POA: Insufficient documentation

## 2016-03-08 DIAGNOSIS — Z881 Allergy status to other antibiotic agents status: Secondary | ICD-10-CM | POA: Insufficient documentation

## 2016-03-08 DIAGNOSIS — Z923 Personal history of irradiation: Secondary | ICD-10-CM

## 2016-03-08 DIAGNOSIS — Z90722 Acquired absence of ovaries, bilateral: Secondary | ICD-10-CM | POA: Insufficient documentation

## 2016-03-08 LAB — URINALYSIS, COMPLETE (UACMP) WITH MICROSCOPIC
BILIRUBIN URINE: NEGATIVE
Glucose, UA: NEGATIVE mg/dL
HGB URINE DIPSTICK: NEGATIVE
KETONES UR: NEGATIVE mg/dL
Nitrite: NEGATIVE
PH: 6 (ref 5.0–8.0)
Protein, ur: NEGATIVE mg/dL
Specific Gravity, Urine: 1.011 (ref 1.005–1.030)

## 2016-03-08 LAB — CBC WITH DIFFERENTIAL/PLATELET
BASOS ABS: 0 10*3/uL (ref 0–0.1)
BASOS PCT: 1 %
EOS ABS: 0 10*3/uL (ref 0–0.7)
Eosinophils Relative: 1 %
HEMATOCRIT: 35.1 % (ref 35.0–47.0)
HEMOGLOBIN: 11.6 g/dL — AB (ref 12.0–16.0)
Lymphocytes Relative: 29 %
Lymphs Abs: 1.4 10*3/uL (ref 1.0–3.6)
MCH: 29.7 pg (ref 26.0–34.0)
MCHC: 33 g/dL (ref 32.0–36.0)
MCV: 89.9 fL (ref 80.0–100.0)
MONOS PCT: 15 %
Monocytes Absolute: 0.7 10*3/uL (ref 0.2–0.9)
NEUTROS ABS: 2.5 10*3/uL (ref 1.4–6.5)
NEUTROS PCT: 54 %
Platelets: 411 10*3/uL (ref 150–440)
RBC: 3.91 MIL/uL (ref 3.80–5.20)
RDW: 15.7 % — ABNORMAL HIGH (ref 11.5–14.5)
WBC: 4.7 10*3/uL (ref 3.6–11.0)

## 2016-03-08 LAB — COMPREHENSIVE METABOLIC PANEL
ALK PHOS: 45 U/L (ref 38–126)
ALT: 12 U/L — AB (ref 14–54)
AST: 23 U/L (ref 15–41)
Albumin: 3.6 g/dL (ref 3.5–5.0)
Anion gap: 5 (ref 5–15)
BILIRUBIN TOTAL: 0.5 mg/dL (ref 0.3–1.2)
BUN: 21 mg/dL — AB (ref 6–20)
CALCIUM: 8.8 mg/dL — AB (ref 8.9–10.3)
CHLORIDE: 106 mmol/L (ref 101–111)
CO2: 27 mmol/L (ref 22–32)
Creatinine, Ser: 0.69 mg/dL (ref 0.44–1.00)
Glucose, Bld: 102 mg/dL — ABNORMAL HIGH (ref 65–99)
Potassium: 3.9 mmol/L (ref 3.5–5.1)
Sodium: 138 mmol/L (ref 135–145)
TOTAL PROTEIN: 7 g/dL (ref 6.5–8.1)

## 2016-03-08 MED ORDER — HEPARIN SOD (PORK) LOCK FLUSH 100 UNIT/ML IV SOLN
500.0000 [IU] | Freq: Once | INTRAVENOUS | Status: DC | PRN
  Filled 2016-03-08: qty 5

## 2016-03-08 MED ORDER — SODIUM CHLORIDE 0.9 % IV SOLN
Freq: Once | INTRAVENOUS | Status: AC
  Administered 2016-03-08: 11:00:00 via INTRAVENOUS
  Filled 2016-03-08: qty 1000

## 2016-03-08 MED ORDER — SODIUM CHLORIDE 0.9 % IV SOLN
10.0000 mg/kg | Freq: Once | INTRAVENOUS | Status: AC
  Administered 2016-03-08: 625 mg via INTRAVENOUS
  Filled 2016-03-08: qty 16

## 2016-03-08 MED ORDER — HEPARIN SOD (PORK) LOCK FLUSH 100 UNIT/ML IV SOLN
500.0000 [IU] | Freq: Once | INTRAVENOUS | Status: AC
  Administered 2016-03-08: 500 [IU] via INTRAVENOUS

## 2016-03-08 MED ORDER — SODIUM CHLORIDE 0.9% FLUSH
10.0000 mL | Freq: Once | INTRAVENOUS | Status: AC
  Administered 2016-03-08: 10 mL via INTRAVENOUS
  Filled 2016-03-08: qty 10

## 2016-03-08 NOTE — Progress Notes (Signed)
Patient here for follow-up fallopian tube cancer.

## 2016-03-08 NOTE — Assessment & Plan Note (Addendum)
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 JAN 2018-- CT scan- STABLE Lymphadenopathy in right groin. No clinical progression noted.   # proceed with AVASTIN today; labs okay; UA- pending today.   #  Right R swelling-chronic ? Sec to LN-  STABLE.    # Elevated BP- rechecked 142/88. Recommend checking at home; to cal [place] if consistently elevated to call; will need anti-HTN.   # "Stomach flu"-diarrhea/ improving.   # 71mm pancreatic lesion- monitor for now.   # Chronic UTIs- on macrobid.   # / follow up in 3 weeks/ cbc/cmp/UA/Avastin.

## 2016-03-08 NOTE — Progress Notes (Signed)
[place] OFFICE PROGRESS NOTE  Patient Care Team: Ezequiel Kayser, [name]  No matching staging information was found for the patient.   Oncology History   . 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  Adequate TRS, no macroscopic residual. IP/IV chemotherapy with DDP and paclitaxel on GOG protocol, chemo and Bev consolidation completed in 03/2011 2. 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 November 14, 2011- Patient was started on carboplatin and gemcitabine 4. Finished 6 cycles of chemotherapy in April of 2014 with carboplatin and gemcitabine. Tolerance was fairly good except for neutropenia and thrombocytopenia. 5.recurrent disease by CT scan February of 2015  # .radiation therapy to pelvis  May of 2015  7.upper extremity  . and deep vein thrombosis associated with port.(June of 2015) patient started on   [place] had port removed and had   thrombectomy September 06, 2013. # CT DEC 2016- progressing disease in the right inguinal area # FEB-TAXOL-AVASTIN;  # FEB 2017- AVASTIN q 3W; July 2017- CT- Improved  Right Inguinal LN; STOP AVASTIN sec to potential concerns of AEs  # AUG 2017 3rd- START ZEJULA- declines sec to intol  # OCT 7th CT- STABLE RIGHT INGUINAL LN- RE-START AVASTIN q 3W       Malignant neoplasm of right fallopian tube ([place])   10/05/2009 Initial Diagnosis    Carcinoma of fallopian tube        INTERVAL HISTORY:  Nicole Clayton 83 y.o.  female pleasant patient above history of Fallopian tube cancer metastatic currently  Avastin is here for follow-up.   States to have a had "stomach bug". Had diarrhea stomach discomfort is currently resolved. Denies any nausea vomiting.  Denies any epistaxis. Denies any unusual or significant headaches. Denies any shortness of breath or headaches or epistaxis. She continues to chronic swelling in the  left leg. Otherwise no weight loss or abdominal bloating.  REVIEW OF SYSTEMS:  A complete 10 point review of system is done which is negative except mentioned above/history of present illness. Complains of hoarseness of voice. She has history of chronic UTIs on Macrobid prophylaxis.  PAST MEDICAL HISTORY :  Past Medical History:  Diagnosis Date  . Bladder infection, chronic 10/19/2011  . Cancer (HCC)    Ovarian   . Carcinoma of fallopian tube ([place]) 10/05/2009   Overview:  Overview:  Overview:  Drs. Claiborne Rigg and Delorise Shiner Choksi Overview:  Drs. Claiborne Rigg and [employer]   . GERD (gastroesophageal reflux disease)   . MI (mitral incompetence) 10/27/2014   Overview:  MODERATE   . Mitral valve prolapse   . OAB (overactive bladder)   . Tachycardia     PAST SURGICAL HISTORY :   Past Surgical History:  Procedure Laterality Date  . ABDOMINAL HYSTERECTOMY    . CHOLECYSTECTOMY    . LAPAROSCOPIC BILATERAL SALPINGO OOPHERECTOMY  2011  . ovarian cancer surgery  2011    FAMILY HISTORY :   Family History  Problem Relation Age of Onset  . Ovarian cancer Other   . Breast cancer Neg Hx   . Colon cancer Neg Hx   . Diabetes Neg Hx   . Heart disease Neg Hx   . Kidney disease Neg Hx   . Bladder Cancer Neg Hx     SOCIAL HISTORY:   Social History  Substance Use Topics  . Smoking status: Never Smoker  . Smokeless tobacco: Never  Used  . Alcohol use No    ALLERGIES:  is allergic to benadryl [diphenhydramine]; tamsulosin; alendronate sodium; duloxetine hcl; risedronate sodium; lovastatin; and sulfa antibiotics.  MEDICATIONS:  Current Outpatient Prescriptions  Medication Sig Dispense Refill  . Ascorbic Acid (VITAMIN C) 100 MG tablet Take 100 mg by mouth daily.    Marland Kitchen aspirin EC 81 MG tablet Take 81 mg by mouth.    . cetirizine (ZYRTEC) 10 MG tablet Take 10 mg by mouth daily.     . Cholecalciferol (VITAMIN D3) 2000 UNITS capsule Take 2,000 Units by mouth daily.     Marland Kitchen docusate sodium  (COLACE) 100 MG capsule Take 100 mg by mouth.    . fluticasone (FLONASE) 50 MCG/ACT nasal spray Place 1 spray into both nostrils daily.     Marland Kitchen gabapentin (NEURONTIN) 600 MG tablet TAKE (1) TABLET BY MOUTH THREE TIMES A DAY 180 tablet 0  . nitrofurantoin (MACRODANTIN) 100 MG capsule Take 100 mg by mouth 4 (four) times daily.    . ondansetron (ZOFRAN) 4 MG tablet TAKE (1) TABLET BY MOUTH EVERY 8 HOURS AS NEEDED FOR NAUSEA/VOMITING 30 tablet 3  . pantoprazole (PROTONIX) 40 MG tablet Take 40 mg by mouth.    . triamcinolone ointment (KENALOG) 0.1 % Apply 1 application topically 2 (two) times daily. (Patient taking differently: Apply 1 application topically 2 (two) times daily as needed. ) 30 g 1  . acetaminophen (TYLENOL) 500 MG tablet Take 1 tablet (500 mg total) by mouth every 6 (six) hours as needed. (Patient not taking: Reported on 03/08/2016) 30 tablet 0   No current facility-administered medications for this visit.    Facility-Administered Medications Ordered in Other Visits  Medication Dose Route Frequency Provider Last Rate Last Dose  . diphenhydrAMINE (BENADRYL) injection 50 mg  50 mg Intravenous Once Forest Gleason, MD      . heparin lock flush 100 unit/mL  500 Units Intravenous Once [name], MD      . sodium chloride 0.9 % injection 10 mL  10 mL Intravenous PRN Forest Gleason, MD   10 mL at 01/12/15 1116  . sodium chloride 0.9 % injection 10 mL  10 mL Intravenous PRN Forest Gleason, MD   10 mL at 02/16/15 1025    PHYSICAL EXAMINATION: ECOG PERFORMANCE STATUS: 2 - Symptomatic, <50% confined to bed  BP (!) 150/96   Pulse 81   Temp 98.6 F (37 C) (Tympanic)   Resp 20   There were no vitals filed for this visit.  GENERAL: Well-nourished well-developed; Alert, no distress and comfortable.  She is walking herself. Accompanied by daughter. EYES: no pallor or icterus OROPHARYNX: no thrush or ulceration; good dentition  NECK: supple, no masses felt LYMPH:  no palpable  lymphadenopathy in the cervical, axillary; Right ingiunal LN ~2-3cm in size.  LUNGS: clear to auscultation and  No wheeze or crackles HEART/CVS: regular rate & rhythm and no murmurs; chronic right lower extremity swelling ABDOMEN:abdomen soft, non-tender and normal bowel sounds Musculoskeletal:no cyanosis of digits and no clubbing  PSYCH: alert & oriented x 3 with fluent speech NEURO: no focal motor/sensory deficits SKIN:  no rashes or significant lesions  LABORATORY DATA:  I have reviewed the data as listed    Component Value Date/Time   NA 138 03/08/2016 0921   NA 138 06/02/2014 0910   K 3.9 03/08/2016 0921   K 3.7 06/02/2014 0910   CL 106 03/08/2016 0921   CL 107 06/02/2014 0910   CO2 27 03/08/2016  0921   CO2 26 06/02/2014 0910   GLUCOSE 102 (H) 03/08/2016 0921   GLUCOSE 106 (H) 06/02/2014 0910   BUN 21 (H) 03/08/2016 0921   BUN 17 06/02/2014 0910   CREATININE 0.69 03/08/2016 0921   CREATININE 1.00 06/02/2014 0910   CALCIUM 8.8 (L) 03/08/2016 0921   CALCIUM 8.6 (L) 06/02/2014 0910   PROT 7.0 03/08/2016 0921   PROT 6.3 (L) 06/02/2014 0910   ALBUMIN 3.6 03/08/2016 0921   ALBUMIN 3.4 (L) 06/02/2014 0910   AST 23 03/08/2016 0921   AST 23 06/02/2014 0910   ALT 12 (L) 03/08/2016 0921   ALT 10 (L) 06/02/2014 0910   ALKPHOS 45 03/08/2016 0921   ALKPHOS 46 06/02/2014 0910   BILITOT 0.5 03/08/2016 0921   BILITOT 0.5 06/02/2014 0910   GFRNONAA >60 03/08/2016 0921   GFRNONAA 52 (L) 06/02/2014 0910   GFRAA >60 03/08/2016 0921   GFRAA >60 06/02/2014 0910    No results found for: SPEP, UPEP  Lab Results  Component Value Date   WBC 4.7 03/08/2016   NEUTROABS 2.5 03/08/2016   HGB 11.6 (L) 03/08/2016   HCT 35.1 03/08/2016   MCV 89.9 03/08/2016   PLT 411 03/08/2016      Chemistry      Component Value Date/Time   NA 138 03/08/2016 0921   NA 138 06/02/2014 0910   K 3.9 03/08/2016 0921   K 3.7 06/02/2014 0910   CL 106 03/08/2016 0921   CL 107 06/02/2014 0910   CO2 27  03/08/2016 0921   CO2 26 06/02/2014 0910   BUN 21 (H) 03/08/2016 0921   BUN 17 06/02/2014 0910   CREATININE 0.69 03/08/2016 0921   CREATININE 1.00 06/02/2014 0910      Component Value Date/Time   CALCIUM 8.8 (L) 03/08/2016 0921   CALCIUM 8.6 (L) 06/02/2014 0910   ALKPHOS 45 03/08/2016 0921   ALKPHOS 46 06/02/2014 0910   AST 23 03/08/2016 0921   AST 23 06/02/2014 0910   ALT 12 (L) 03/08/2016 0921   ALT 10 (L) 06/02/2014 0910   BILITOT 0.5 03/08/2016 0921   BILITOT 0.5 06/02/2014 0910       RADIOGRAPHIC STUDIES: I have personally reviewed the radiological images as listed and agreed with the findings in the report. No results found.   ASSESSMENT & PLAN:  Malignant neoplasm of right fallopian tube [facility]) Metastatic fallopian tube cancer- currently on Avastin since February 2017. JAN 2018-- CT scan- STABLE Lymphadenopathy in right groin. No clinical progression noted.   # proceed with AVASTIN today; labs okay; UA- pending today.   #  Right R swelling-chronic ? Sec to LN-  STABLE.    # Elevated BP- rechecked 142/88. Recommend checking at home; to cal [place] if consistently elevated to call; will need anti-HTN.   # "Stomach flu"-diarrhea/ improving.   # 37mm pancreatic lesion- monitor for now.   # Chronic UTIs- on macrobid.   # / follow up in 3 weeks/ cbc/cmp/UA/Avastin.    No orders of the defined types were placed in this encounter.     [name], MD 03/08/2016 10:52 AM

## 2016-03-29 ENCOUNTER — Inpatient Hospital Stay: Payer: Medicare HMO

## 2016-03-29 ENCOUNTER — Inpatient Hospital Stay (HOSPITAL_BASED_OUTPATIENT_CLINIC_OR_DEPARTMENT_OTHER): Payer: Medicare HMO | Admitting: Internal Medicine

## 2016-03-29 VITALS — BP 115/70 | HR 64 | Temp 96.8°F | Wt 134.0 lb

## 2016-03-29 DIAGNOSIS — Z90722 Acquired absence of ovaries, bilateral: Secondary | ICD-10-CM | POA: Diagnosis not present

## 2016-03-29 DIAGNOSIS — I341 Nonrheumatic mitral (valve) prolapse: Secondary | ICD-10-CM

## 2016-03-29 DIAGNOSIS — Z7982 Long term (current) use of aspirin: Secondary | ICD-10-CM | POA: Diagnosis not present

## 2016-03-29 DIAGNOSIS — C5701 Malignant neoplasm of right fallopian tube: Secondary | ICD-10-CM | POA: Diagnosis not present

## 2016-03-29 DIAGNOSIS — N3281 Overactive bladder: Secondary | ICD-10-CM

## 2016-03-29 DIAGNOSIS — R600 Localized edema: Secondary | ICD-10-CM | POA: Diagnosis not present

## 2016-03-29 DIAGNOSIS — C774 Secondary and unspecified malignant neoplasm of inguinal and lower limb lymph nodes: Secondary | ICD-10-CM

## 2016-03-29 DIAGNOSIS — A084 Viral intestinal infection, unspecified: Secondary | ICD-10-CM

## 2016-03-29 DIAGNOSIS — Z5112 Encounter for antineoplastic immunotherapy: Secondary | ICD-10-CM | POA: Diagnosis not present

## 2016-03-29 DIAGNOSIS — K219 Gastro-esophageal reflux disease without esophagitis: Secondary | ICD-10-CM

## 2016-03-29 DIAGNOSIS — R490 Dysphonia: Secondary | ICD-10-CM

## 2016-03-29 DIAGNOSIS — K449 Diaphragmatic hernia without obstruction or gangrene: Secondary | ICD-10-CM

## 2016-03-29 DIAGNOSIS — Z86718 Personal history of other venous thrombosis and embolism: Secondary | ICD-10-CM

## 2016-03-29 DIAGNOSIS — Z9221 Personal history of antineoplastic chemotherapy: Secondary | ICD-10-CM

## 2016-03-29 DIAGNOSIS — Z87440 Personal history of urinary (tract) infections: Secondary | ICD-10-CM

## 2016-03-29 DIAGNOSIS — Z79899 Other long term (current) drug therapy: Secondary | ICD-10-CM

## 2016-03-29 DIAGNOSIS — Z792 Long term (current) use of antibiotics: Secondary | ICD-10-CM

## 2016-03-29 DIAGNOSIS — Z923 Personal history of irradiation: Secondary | ICD-10-CM

## 2016-03-29 DIAGNOSIS — Z881 Allergy status to other antibiotic agents status: Secondary | ICD-10-CM

## 2016-03-29 DIAGNOSIS — I700 Atherosclerosis of aorta: Secondary | ICD-10-CM

## 2016-03-29 LAB — CBC WITH DIFFERENTIAL/PLATELET
Basophils Absolute: 0 10*3/uL (ref 0–0.1)
Basophils Relative: 1 %
EOS ABS: 0.1 10*3/uL (ref 0–0.7)
EOS PCT: 2 %
HCT: 34.5 % — ABNORMAL LOW (ref 35.0–47.0)
Hemoglobin: 11.7 g/dL — ABNORMAL LOW (ref 12.0–16.0)
LYMPHS ABS: 1.2 10*3/uL (ref 1.0–3.6)
Lymphocytes Relative: 29 %
MCH: 30.4 pg (ref 26.0–34.0)
MCHC: 33.8 g/dL (ref 32.0–36.0)
MCV: 90 fL (ref 80.0–100.0)
MONOS PCT: 17 %
Monocytes Absolute: 0.7 10*3/uL (ref 0.2–0.9)
Neutro Abs: 2.2 10*3/uL (ref 1.4–6.5)
Neutrophils Relative %: 51 %
PLATELETS: 373 10*3/uL (ref 150–440)
RBC: 3.83 MIL/uL (ref 3.80–5.20)
RDW: 15.9 % — AB (ref 11.5–14.5)
WBC: 4.2 10*3/uL (ref 3.6–11.0)

## 2016-03-29 LAB — COMPREHENSIVE METABOLIC PANEL
ALT: 13 U/L — ABNORMAL LOW (ref 14–54)
ANION GAP: 8 (ref 5–15)
AST: 26 U/L (ref 15–41)
Albumin: 3.6 g/dL (ref 3.5–5.0)
Alkaline Phosphatase: 47 U/L (ref 38–126)
BUN: 23 mg/dL — ABNORMAL HIGH (ref 6–20)
CHLORIDE: 104 mmol/L (ref 101–111)
CO2: 25 mmol/L (ref 22–32)
Calcium: 8.8 mg/dL — ABNORMAL LOW (ref 8.9–10.3)
Creatinine, Ser: 0.84 mg/dL (ref 0.44–1.00)
GFR calc non Af Amer: >60 mL/min (ref >60)
Glucose, Bld: 84 mg/dL (ref 65–99)
Potassium: 4 mmol/L (ref 3.5–5.1)
SODIUM: 137 mmol/L (ref 135–145)
Total Bilirubin: 0.5 mg/dL (ref 0.3–1.2)
Total Protein: 7.1 g/dL (ref 6.5–8.1)

## 2016-03-29 LAB — URINALYSIS, COMPLETE (UACMP) WITH MICROSCOPIC
BILIRUBIN URINE: NEGATIVE
GLUCOSE, UA: NEGATIVE mg/dL
HGB URINE DIPSTICK: NEGATIVE
KETONES UR: NEGATIVE mg/dL
Nitrite: NEGATIVE
PH: 6 (ref 5.0–8.0)
Protein, ur: NEGATIVE mg/dL
Specific Gravity, Urine: 1.009 (ref 1.005–1.030)

## 2016-03-29 MED ORDER — SODIUM CHLORIDE 0.9 % IV SOLN
Freq: Once | INTRAVENOUS | Status: AC
  Administered 2016-03-29: 11:00:00 via INTRAVENOUS
  Filled 2016-03-29: qty 1000

## 2016-03-29 MED ORDER — HEPARIN SOD (PORK) LOCK FLUSH 100 UNIT/ML IV SOLN
500.0000 [IU] | Freq: Once | INTRAVENOUS | Status: AC | PRN
Start: 2016-03-29 — End: 2016-03-29
  Administered 2016-03-29: 500 [IU]
  Filled 2016-03-29: qty 5

## 2016-03-29 MED ORDER — SODIUM CHLORIDE 0.9 % IV SOLN
10.0000 mg/kg | Freq: Once | INTRAVENOUS | Status: AC
  Administered 2016-03-29: 625 mg via INTRAVENOUS
  Filled 2016-03-29: qty 16

## 2016-03-29 MED ORDER — NITROFURANTOIN MACROCRYSTAL 100 MG PO CAPS: ORAL_CAPSULE | ORAL | 3 refills | Status: DC

## 2016-03-29 NOTE — Progress Notes (Signed)
Patient here today for follow up.  Patient c/o dysuria and lower abdominal pressure, requesting urinalysis/culture to be completed today

## 2016-03-29 NOTE — Assessment & Plan Note (Addendum)
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 JAN 31st  2018-- CT scan- STABLE Lymphadenopathy in right groin. No clinical progression noted.   # proceed with AVASTIN today; Labs today reviewed;  acceptable for treatment today.   UA- pending today.   #  Right R swelling-chronic ? Sec to LN-  STABLE.    # Elevated BP- improved; reviewed the log at home. Continue avastin.   # 34mm pancreatic lesion- monitor for now. Sister died of pancreatic cancer.   # Chronic UTIs- on macrobid. New script given.   # / follow up in 3 weeks/ cbc/cmp/UA/Avastin.

## 2016-03-29 NOTE — Progress Notes (Signed)
[place] OFFICE PROGRESS NOTE  Patient Care Team: Ezequiel Kayser, [name]  No matching staging information was found for the patient.   Oncology History   . 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  Adequate TRS, no macroscopic residual. IP/IV chemotherapy with DDP and paclitaxel on GOG protocol, chemo and Bev consolidation completed in 03/2011 2. 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 November 14, 2011- Patient was started on carboplatin and gemcitabine 4. Finished 6 cycles of chemotherapy in April of 2014 with carboplatin and gemcitabine. Tolerance was fairly good except for neutropenia and thrombocytopenia. 5.recurrent disease by CT scan February of 2015  # .radiation therapy to pelvis  May of 2015  7.upper extremity  . and deep vein thrombosis associated with port.(June of 2015) patient started on   [place] had port removed and had   thrombectomy September 06, 2013. # CT DEC 2016- progressing disease in the right inguinal area # FEB-TAXOL-AVASTIN;  # FEB 2017- AVASTIN q 3W; July 2017- CT- Improved  Right Inguinal LN; STOP AVASTIN sec to potential concerns of AEs  # AUG 2017 3rd- START ZEJULA- declines sec to intol  # OCT 7th CT- STABLE RIGHT INGUINAL LN- RE-START AVASTIN q 3W       Malignant neoplasm of right fallopian tube ([place])   10/05/2009 Initial Diagnosis    Carcinoma of fallopian tube        INTERVAL HISTORY:  Nicole Clayton 83 y.o.  female pleasant patient above history of Fallopian tube cancer metastatic currently  Avastin is here for follow-up.   Patient states her sister recently died of pain due to cancer.   Complains of burning during urination. Increased frequency. No fevers no chills; on chronic Macrobid. Denies any epistaxis. Denies any unusual or significant headaches. Denies any shortness of breath or headaches or  epistaxis. She continues to chronic swelling in the left leg. Otherwise no weight loss or abdominal bloating.  REVIEW OF SYSTEMS:  A complete 10 point review of system is done which is negative except mentioned above/history of present illness. Complains of hoarseness of voice.  PAST MEDICAL HISTORY :  Past Medical History:  Diagnosis Date  . Bladder infection, chronic 10/19/2011  . Cancer (HCC)    Ovarian   . Carcinoma of fallopian tube ([place]) 10/05/2009   Overview:  Overview:  Overview:  Drs. Claiborne Rigg and Delorise Shiner Choksi Overview:  Drs. Claiborne Rigg and [employer]   . GERD (gastroesophageal reflux disease)   . MI (mitral incompetence) 10/27/2014   Overview:  MODERATE   . Mitral valve prolapse   . OAB (overactive bladder)   . Tachycardia     PAST SURGICAL HISTORY :   Past Surgical History:  Procedure Laterality Date  . ABDOMINAL HYSTERECTOMY    . CHOLECYSTECTOMY    . LAPAROSCOPIC BILATERAL SALPINGO OOPHERECTOMY  2011  . ovarian cancer surgery  2011    FAMILY HISTORY :   Family History  Problem Relation Age of Onset  . Ovarian cancer Other   . Breast cancer Neg Hx   . Colon cancer Neg Hx   . Diabetes Neg Hx   . Heart disease Neg Hx   . Kidney disease Neg Hx   . Bladder Cancer Neg Hx     SOCIAL HISTORY:   Social History  Substance Use Topics  . Smoking status: Never Smoker  . Smokeless tobacco: Never Used  .  Alcohol use No    ALLERGIES:  is allergic to benadryl [diphenhydramine]; tamsulosin; alendronate sodium; duloxetine hcl; risedronate sodium; lovastatin; and sulfa antibiotics.  MEDICATIONS:  Current Outpatient Prescriptions  Medication Sig Dispense Refill  . acetaminophen (TYLENOL) 500 MG tablet Take 1 tablet (500 mg total) by mouth every 6 (six) hours as needed. 30 tablet 0  . Ascorbic Acid (VITAMIN C) 100 MG tablet Take 100 mg by mouth daily.    Marland Kitchen aspirin EC 81 MG tablet Take 81 mg by mouth.    . cetirizine (ZYRTEC) 10 MG tablet Take 10 mg by mouth  daily.     . Cholecalciferol (VITAMIN D3) 2000 UNITS capsule Take 2,000 Units by mouth daily.     Marland Kitchen docusate sodium (COLACE) 100 MG capsule Take 100 mg by mouth.    . fluticasone (FLONASE) 50 MCG/ACT nasal spray Place 1 spray into both nostrils daily.     Marland Kitchen gabapentin (NEURONTIN) 600 MG tablet TAKE (1) TABLET BY MOUTH THREE TIMES A DAY 180 tablet 0  . metoprolol succinate (TOPROL-XL) 50 MG 24 hr tablet Take 50 mg by mouth daily.    . Multiple Vitamins-Minerals (CENTRUM SILVER PO) Take by mouth.    . nitrofurantoin (MACRODANTIN) 100 MG capsule Once a day. 30 capsule 3  . ondansetron (ZOFRAN) 4 MG tablet TAKE (1) TABLET BY MOUTH EVERY 8 HOURS AS NEEDED FOR NAUSEA/VOMITING 30 tablet 3  . pantoprazole (PROTONIX) 40 MG tablet Take 40 mg by mouth.    . triamcinolone ointment (KENALOG) 0.1 % Apply 1 application topically 2 (two) times daily. (Patient taking differently: Apply 1 application topically 2 (two) times daily as needed. ) 30 g 1   No current facility-administered medications for this visit.    Facility-Administered Medications Ordered in Other Visits  Medication Dose Route Frequency Provider Last Rate Last Dose  . 0.9 %  sodium chloride infusion   Intravenous Once [name], MD      . bevacizumab (AVASTIN) 625 mg in sodium chloride 0.9 % 100 mL chemo infusion  10 mg/kg (Treatment Plan Recorded) Intravenous Once [name], MD      . diphenhydrAMINE (BENADRYL) injection 50 mg  50 mg Intravenous Once Forest Gleason, MD      . heparin lock flush 100 unit/mL  500 Units Intracatheter Once PRN [name], MD      . sodium chloride 0.9 % injection 10 mL  10 mL Intravenous PRN Forest Gleason, MD   10 mL at 01/12/15 1116  . sodium chloride 0.9 % injection 10 mL  10 mL Intravenous PRN Forest Gleason, MD   10 mL at 02/16/15 1025    PHYSICAL EXAMINATION: ECOG PERFORMANCE STATUS: 2 - Symptomatic, <50% confined to bed  BP 115/70 (BP Location: Left Arm, Patient Position:  Sitting)   Pulse 64   Temp (!) 96.8 F (36 C) (Tympanic)   Wt 134 lb (60.8 kg)   BMI 26.17 kg/m   Filed Weights   03/29/16 0928  Weight: 134 lb (60.8 kg)    GENERAL: Well-nourished well-developed; Alert, no distress and comfortable.  She is walking herself. Accompanied by daughter. EYES: no pallor or icterus OROPHARYNX: no thrush or ulceration; good dentition  NECK: supple, no masses felt LYMPH:  no palpable lymphadenopathy in the cervical, axillary; Right ingiunal LN ~2-3cm in size.  LUNGS: clear to auscultation and  No wheeze or crackles HEART/CVS: regular rate & rhythm and no murmurs; chronic right lower extremity swelling ABDOMEN:abdomen soft, non-tender and normal  bowel sounds Musculoskeletal:no cyanosis of digits and no clubbing  PSYCH: alert & oriented x 3 with fluent speech NEURO: no focal motor/sensory deficits SKIN:  no rashes or significant lesions  LABORATORY DATA:  I have reviewed the data as listed    Component Value Date/Time   NA 137 03/29/2016 0853   NA 138 06/02/2014 0910   K 4.0 03/29/2016 0853   K 3.7 06/02/2014 0910   CL 104 03/29/2016 0853   CL 107 06/02/2014 0910   CO2 25 03/29/2016 0853   CO2 26 06/02/2014 0910   GLUCOSE 84 03/29/2016 0853   GLUCOSE 106 (H) 06/02/2014 0910   BUN 23 (H) 03/29/2016 0853   BUN 17 06/02/2014 0910   CREATININE 0.84 03/29/2016 0853   CREATININE 1.00 06/02/2014 0910   CALCIUM 8.8 (L) 03/29/2016 0853   CALCIUM 8.6 (L) 06/02/2014 0910   PROT 7.1 03/29/2016 0853   PROT 6.3 (L) 06/02/2014 0910   ALBUMIN 3.6 03/29/2016 0853   ALBUMIN 3.4 (L) 06/02/2014 0910   AST 26 03/29/2016 0853   AST 23 06/02/2014 0910   ALT 13 (L) 03/29/2016 0853   ALT 10 (L) 06/02/2014 0910   ALKPHOS 47 03/29/2016 0853   ALKPHOS 46 06/02/2014 0910   BILITOT 0.5 03/29/2016 0853   BILITOT 0.5 06/02/2014 0910   GFRNONAA >60 03/29/2016 0853   GFRNONAA 52 (L) 06/02/2014 0910   GFRAA >60 03/29/2016 0853   GFRAA >60 06/02/2014 0910    No  results found for: SPEP, UPEP  Lab Results  Component Value Date   WBC 4.2 03/29/2016   NEUTROABS 2.2 03/29/2016   HGB 11.7 (L) 03/29/2016   HCT 34.5 (L) 03/29/2016   MCV 90.0 03/29/2016   PLT 373 03/29/2016      Chemistry      Component Value Date/Time   NA 137 03/29/2016 0853   NA 138 06/02/2014 0910   K 4.0 03/29/2016 0853   K 3.7 06/02/2014 0910   CL 104 03/29/2016 0853   CL 107 06/02/2014 0910   CO2 25 03/29/2016 0853   CO2 26 06/02/2014 0910   BUN 23 (H) 03/29/2016 0853   BUN 17 06/02/2014 0910   CREATININE 0.84 03/29/2016 0853   CREATININE 1.00 06/02/2014 0910      Component Value Date/Time   CALCIUM 8.8 (L) 03/29/2016 0853   CALCIUM 8.6 (L) 06/02/2014 0910   ALKPHOS 47 03/29/2016 0853   ALKPHOS 46 06/02/2014 0910   AST 26 03/29/2016 0853   AST 23 06/02/2014 0910   ALT 13 (L) 03/29/2016 0853   ALT 10 (L) 06/02/2014 0910   BILITOT 0.5 03/29/2016 0853   BILITOT 0.5 06/02/2014 0910     IMPRESSION: 1. No acute findings identified within the abdomen or pelvis. 2. Stable appearance of right external iliac and right inguinal metastatic adenopathy. 3. Large hiatal hernia. 4. Aortic atherosclerosis. 5. Stable small cystic lesion arising from tail of pancreas.   Electronically Signed   By: Kerby Moors M.D.   On: 02/15/2016 09:58  RADIOGRAPHIC STUDIES: I have personally reviewed the radiological images as listed and agreed with the findings in the report. No results found.   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 JAN 31st  2018-- CT scan- STABLE Lymphadenopathy in right groin. No clinical progression noted.   # proceed with AVASTIN today; Labs today reviewed;  acceptable for treatment today.   UA- pending today.   #  Right R swelling-chronic ? Sec to LN-  STABLE.    # Elevated BP- improved; reviewed the log at home. Continue avastin.   # 60mm  pancreatic lesion- monitor for now. Sister died of pancreatic cancer.   # Chronic UTIs- on macrobid. New script given.   # / follow up in 3 weeks/ cbc/cmp/UA/Avastin.    Orders Placed This Encounter  Procedures  . CBC with Differential/Platelet    Standing Status:   Future    Standing Expiration Date:   05/27/2016  . Comprehensive metabolic panel    Standing Status:   Future    Standing Expiration Date:   05/27/2016  . Urinalysis, Complete w Microscopic    Standing Status:   Future    Standing Expiration Date:   05/27/2016  . Urinalysis, Complete w Microscopic    Standing Status:   Future    Standing Expiration Date:   04/26/2016      [name], MD 03/29/2016 10:31 AM

## 2016-04-18 ENCOUNTER — Ambulatory Visit (INDEPENDENT_AMBULATORY_CARE_PROVIDER_SITE_OTHER): Payer: Medicare HMO | Admitting: Obstetrics and Gynecology

## 2016-04-18 ENCOUNTER — Encounter: Payer: Self-pay | Admitting: Obstetrics and Gynecology

## 2016-04-18 VITALS — BP 128/68 | HR 65 | Ht 60.0 in | Wt 136.2 lb

## 2016-04-18 DIAGNOSIS — Z4689 Encounter for fitting and adjustment of other specified devices: Secondary | ICD-10-CM | POA: Diagnosis not present

## 2016-04-18 DIAGNOSIS — R339 Retention of urine, unspecified: Secondary | ICD-10-CM | POA: Diagnosis not present

## 2016-04-18 DIAGNOSIS — N952 Postmenopausal atrophic vaginitis: Secondary | ICD-10-CM

## 2016-04-18 DIAGNOSIS — C5700 Malignant neoplasm of unspecified fallopian tube: Secondary | ICD-10-CM | POA: Diagnosis not present

## 2016-04-18 DIAGNOSIS — N390 Urinary tract infection, site not specified: Secondary | ICD-10-CM | POA: Diagnosis not present

## 2016-04-18 DIAGNOSIS — Z90710 Acquired absence of both cervix and uterus: Secondary | ICD-10-CM | POA: Diagnosis not present

## 2016-04-18 DIAGNOSIS — Z90721 Acquired absence of ovaries, unilateral: Secondary | ICD-10-CM | POA: Diagnosis not present

## 2016-04-18 DIAGNOSIS — N8111 Cystocele, midline: Secondary | ICD-10-CM | POA: Diagnosis not present

## 2016-04-18 NOTE — Patient Instructions (Signed)
1. Return in 3 months for pessary check

## 2016-04-18 NOTE — Progress Notes (Signed)
Chief complaint: 1. Pessary maintenance (last visit 01/25/2016) 2. History of multidrug resistant UTI 3. History of cystocele, grade 2 4. History of fallopian tube cancer with recurrence to right inguinal region  Patient presents for 3 month follow-up. She reports no vaginal bleeding, vaginal discharge, vaginal odor, or vaginal pain. She is using a ring with the port pessary.  Past medical history, past surgical history, problem list, medications, and allergies are reviewed. Patient has ongoing chemotherapy for her fallopian tube cancer diagnosis  OBJECTIVE: BP 128/68   Pulse 65   Ht 5' (1.524 m)   Wt 136 lb 3.2 oz (61.8 kg)   BMI 26.60 kg/m  Pleasant elderly female in no acute stress. Alert and oriented. Affect is appropriate. Abdomen: Soft, nontender without organomegaly Pelvic exam:  External Genitalia: urethral caruncle, unchanged BUS: Normal  Vagina: moderate atrophy, vaginal cuff intact Cervix: surgically absent Uterus: surgically absent Adnexa: no palpable masses RV: Normal external exam Bladder: Nontender Lymph node survey: 2 x 3 cm matted nodes noted in the right inguinal region.  ASSESSMENT: 1. Cystocele, grade 2, stable 2. Pessary maintenance, completed, with pessary being removed, cleaned, and reinserted 3. Incomplete bladder emptying, improved 4. Vaginal atrophy, stable 5. History of fallopian tube cancer with recurrence to right inguinal region, with ongoing follow-up at the cancer center  PLAN: 1.Pessary is removed, cleaned, reinserted. 2. Return in 3 months for follow-up 3. No intravaginal medicattions are being used.  A total of 15 minutes were spent face-to-face with the patient during this encounter and over half of that time dealt with counseling and coordination of care.  Brayton Mars, MD  Note: This dictation was prepared with Dragon dictation along with  smaller phrase technology. Any transcriptional errors that result from this process are unintentional.

## 2016-04-19 ENCOUNTER — Inpatient Hospital Stay (HOSPITAL_BASED_OUTPATIENT_CLINIC_OR_DEPARTMENT_OTHER): Payer: Medicare HMO | Admitting: Internal Medicine

## 2016-04-19 ENCOUNTER — Inpatient Hospital Stay: Payer: Medicare HMO

## 2016-04-19 ENCOUNTER — Inpatient Hospital Stay: Payer: Medicare HMO | Attending: Internal Medicine

## 2016-04-19 VITALS — BP 123/75 | HR 62 | Temp 96.6°F | Resp 18 | Wt 137.0 lb

## 2016-04-19 DIAGNOSIS — I2510 Atherosclerotic heart disease of native coronary artery without angina pectoris: Secondary | ICD-10-CM

## 2016-04-19 DIAGNOSIS — I10 Essential (primary) hypertension: Secondary | ICD-10-CM

## 2016-04-19 DIAGNOSIS — Z90710 Acquired absence of both cervix and uterus: Secondary | ICD-10-CM | POA: Diagnosis not present

## 2016-04-19 DIAGNOSIS — C5701 Malignant neoplasm of right fallopian tube: Secondary | ICD-10-CM

## 2016-04-19 DIAGNOSIS — Z86718 Personal history of other venous thrombosis and embolism: Secondary | ICD-10-CM

## 2016-04-19 DIAGNOSIS — C774 Secondary and unspecified malignant neoplasm of inguinal and lower limb lymph nodes: Secondary | ICD-10-CM

## 2016-04-19 DIAGNOSIS — K219 Gastro-esophageal reflux disease without esophagitis: Secondary | ICD-10-CM | POA: Diagnosis not present

## 2016-04-19 DIAGNOSIS — Z7982 Long term (current) use of aspirin: Secondary | ICD-10-CM | POA: Insufficient documentation

## 2016-04-19 DIAGNOSIS — R490 Dysphonia: Secondary | ICD-10-CM | POA: Diagnosis not present

## 2016-04-19 DIAGNOSIS — I341 Nonrheumatic mitral (valve) prolapse: Secondary | ICD-10-CM | POA: Diagnosis not present

## 2016-04-19 DIAGNOSIS — R600 Localized edema: Secondary | ICD-10-CM | POA: Diagnosis not present

## 2016-04-19 DIAGNOSIS — N3281 Overactive bladder: Secondary | ICD-10-CM | POA: Insufficient documentation

## 2016-04-19 DIAGNOSIS — Z5112 Encounter for antineoplastic immunotherapy: Secondary | ICD-10-CM | POA: Insufficient documentation

## 2016-04-19 DIAGNOSIS — Z923 Personal history of irradiation: Secondary | ICD-10-CM | POA: Insufficient documentation

## 2016-04-19 DIAGNOSIS — Z8041 Family history of malignant neoplasm of ovary: Secondary | ICD-10-CM

## 2016-04-19 DIAGNOSIS — Z90722 Acquired absence of ovaries, bilateral: Secondary | ICD-10-CM | POA: Diagnosis not present

## 2016-04-19 DIAGNOSIS — K869 Disease of pancreas, unspecified: Secondary | ICD-10-CM | POA: Diagnosis not present

## 2016-04-19 DIAGNOSIS — Z79899 Other long term (current) drug therapy: Secondary | ICD-10-CM | POA: Insufficient documentation

## 2016-04-19 DIAGNOSIS — Z87440 Personal history of urinary (tract) infections: Secondary | ICD-10-CM | POA: Insufficient documentation

## 2016-04-19 LAB — COMPREHENSIVE METABOLIC PANEL
ALBUMIN: 3.4 g/dL — AB (ref 3.5–5.0)
ALK PHOS: 45 U/L (ref 38–126)
ALT: 13 U/L — AB (ref 14–54)
AST: 26 U/L (ref 15–41)
Anion gap: 5 (ref 5–15)
BUN: 21 mg/dL — ABNORMAL HIGH (ref 6–20)
CALCIUM: 8.7 mg/dL — AB (ref 8.9–10.3)
CO2: 28 mmol/L (ref 22–32)
CREATININE: 0.84 mg/dL (ref 0.44–1.00)
Chloride: 105 mmol/L (ref 101–111)
GFR calc Af Amer: >60 mL/min (ref >60)
GFR calc non Af Amer: >60 mL/min (ref >60)
GLUCOSE: 139 mg/dL — AB (ref 65–99)
Potassium: 3.5 mmol/L (ref 3.5–5.1)
SODIUM: 138 mmol/L (ref 135–145)
Total Bilirubin: 0.4 mg/dL (ref 0.3–1.2)
Total Protein: 6.6 g/dL (ref 6.5–8.1)

## 2016-04-19 LAB — URINALYSIS, COMPLETE (UACMP) WITH MICROSCOPIC
Bilirubin Urine: NEGATIVE
Glucose, UA: NEGATIVE mg/dL
Hgb urine dipstick: NEGATIVE
KETONES UR: NEGATIVE mg/dL
Nitrite: NEGATIVE
PH: 5 (ref 5.0–8.0)
Protein, ur: NEGATIVE mg/dL
SPECIFIC GRAVITY, URINE: 1.012 (ref 1.005–1.030)

## 2016-04-19 LAB — CBC WITH DIFFERENTIAL/PLATELET
BASOS PCT: 1 %
Basophils Absolute: 0 10*3/uL (ref 0–0.1)
EOS ABS: 0.1 10*3/uL (ref 0–0.7)
Eosinophils Relative: 2 %
HCT: 34 % — ABNORMAL LOW (ref 35.0–47.0)
HEMOGLOBIN: 11.5 g/dL — AB (ref 12.0–16.0)
LYMPHS ABS: 1.4 10*3/uL (ref 1.0–3.6)
Lymphocytes Relative: 31 %
MCH: 30.5 pg (ref 26.0–34.0)
MCHC: 33.8 g/dL (ref 32.0–36.0)
MCV: 90.1 fL (ref 80.0–100.0)
Monocytes Absolute: 0.5 10*3/uL (ref 0.2–0.9)
Monocytes Relative: 12 %
NEUTROS PCT: 54 %
Neutro Abs: 2.4 10*3/uL (ref 1.4–6.5)
Platelets: 388 10*3/uL (ref 150–440)
RBC: 3.77 MIL/uL — AB (ref 3.80–5.20)
RDW: 15.3 % — ABNORMAL HIGH (ref 11.5–14.5)
WBC: 4.4 10*3/uL (ref 3.6–11.0)

## 2016-04-19 MED ORDER — SODIUM CHLORIDE 0.9 % IV SOLN
Freq: Once | INTRAVENOUS | Status: AC
  Administered 2016-04-19: 10:00:00 via INTRAVENOUS
  Filled 2016-04-19: qty 1000

## 2016-04-19 MED ORDER — SODIUM CHLORIDE 0.9 % IJ SOLN
10.0000 mL | INTRAMUSCULAR | Status: DC | PRN
  Administered 2016-04-19: 10 mL
  Filled 2016-04-19: qty 10

## 2016-04-19 MED ORDER — HEPARIN SOD (PORK) LOCK FLUSH 100 UNIT/ML IV SOLN
500.0000 [IU] | Freq: Once | INTRAVENOUS | Status: AC | PRN
  Administered 2016-04-19: 500 [IU]
  Filled 2016-04-19: qty 5

## 2016-04-19 MED ORDER — BEVACIZUMAB CHEMO INJECTION 400 MG/16ML
10.0000 mg/kg | Freq: Once | INTRAVENOUS | Status: AC
  Administered 2016-04-19: 625 mg via INTRAVENOUS
  Filled 2016-04-19: qty 16

## 2016-04-19 NOTE — Progress Notes (Signed)
Patient here today for follow up.  Patient states no new concerns today  

## 2016-04-19 NOTE — Progress Notes (Signed)
[place] OFFICE PROGRESS NOTE  Patient Care Team: Ezequiel Kayser, [name]  No matching staging information was found for the patient.   Oncology History   . 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  Adequate TRS, no macroscopic residual. IP/IV chemotherapy with DDP and paclitaxel on GOG protocol, chemo and Bev consolidation completed in 03/2011 2. 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 November 14, 2011- Patient was started on carboplatin and gemcitabine 4. Finished 6 cycles of chemotherapy in April of 2014 with carboplatin and gemcitabine. Tolerance was fairly good except for neutropenia and thrombocytopenia. 5.recurrent disease by CT scan February of 2015  # .radiation therapy to pelvis  May of 2015  7.upper extremity  . and deep vein thrombosis associated with port.(June of 2015) patient started on   [place] had port removed and had   thrombectomy September 06, 2013. # CT DEC 2016- progressing disease in the right inguinal area # FEB-TAXOL-AVASTIN;  # FEB 2017- AVASTIN q 3W; July 2017- CT- Improved  Right Inguinal LN; STOP AVASTIN sec to potential concerns of AEs  # AUG 2017 3rd- START ZEJULA- declines sec to intol  # OCT 7th CT- STABLE RIGHT INGUINAL LN- RE-START AVASTIN q 3W       Malignant neoplasm of right fallopian tube ([place])   10/05/2009 Initial Diagnosis    Carcinoma of fallopian tube        INTERVAL HISTORY:  Nicole Clayton 83 y.o.  female pleasant patient above history of Fallopian tube cancer metastatic currently  Avastin is here for follow-up.   Denies any pain. Denies any epistaxis.  Denies any unusual or significant headaches. Denies any shortness of breath or headaches. She continues to chronic swelling in the left leg. Otherwise no weight loss or abdominal bloating. No recent strokes.  REVIEW OF SYSTEMS:  A complete 10 point  review of system is done which is negative except mentioned above/history of present illness. Complains of hoarseness of voice.  PAST MEDICAL HISTORY :  Past Medical History:  Diagnosis Date  . Bladder infection, chronic 10/19/2011  . Cancer (HCC)    Ovarian   . Carcinoma of fallopian tube ([place]) 10/05/2009   Overview:  Overview:  Overview:  Drs. Claiborne Rigg and Delorise Shiner Choksi Overview:  Drs. Claiborne Rigg and [employer]   . GERD (gastroesophageal reflux disease)   . MI (mitral incompetence) 10/27/2014   Overview:  MODERATE   . Mitral valve prolapse   . OAB (overactive bladder)   . Tachycardia     PAST SURGICAL HISTORY :   Past Surgical History:  Procedure Laterality Date  . ABDOMINAL HYSTERECTOMY    . CHOLECYSTECTOMY    . LAPAROSCOPIC BILATERAL SALPINGO OOPHERECTOMY  2011  . ovarian cancer surgery  2011    FAMILY HISTORY :   Family History  Problem Relation Age of Onset  . Ovarian cancer Other   . Breast cancer Neg Hx   . Colon cancer Neg Hx   . Diabetes Neg Hx   . Heart disease Neg Hx   . Kidney disease Neg Hx   . Bladder Cancer Neg Hx     SOCIAL HISTORY:   Social History  Substance Use Topics  . Smoking status: Never Smoker  . Smokeless tobacco: Never Used  . Alcohol use No    ALLERGIES:  is allergic to benadryl [diphenhydramine]; tamsulosin; alendronate sodium; duloxetine hcl; risedronate sodium;  lovastatin; and sulfa antibiotics.  MEDICATIONS:  Current Outpatient Prescriptions  Medication Sig Dispense Refill  . acetaminophen (TYLENOL) 500 MG tablet Take 1 tablet (500 mg total) by mouth every 6 (six) hours as needed. 30 tablet 0  . Ascorbic Acid (VITAMIN C) 100 MG tablet Take 100 mg by mouth daily.    Marland Kitchen aspirin EC 81 MG tablet Take 81 mg by mouth.    . cetirizine (ZYRTEC) 10 MG tablet Take 10 mg by mouth daily.     . Cholecalciferol (VITAMIN D3) 2000 UNITS capsule Take 2,000 Units by mouth daily.     Marland Kitchen docusate sodium (COLACE) 100 MG capsule Take 100 mg by  mouth.    . fluticasone (FLONASE) 50 MCG/ACT nasal spray Place 1 spray into both nostrils daily.     Marland Kitchen gabapentin (NEURONTIN) 600 MG tablet TAKE (1) TABLET BY MOUTH THREE TIMES A DAY 180 tablet 0  . metoprolol succinate (TOPROL-XL) 50 MG 24 hr tablet Take 50 mg by mouth daily.    . Multiple Vitamins-Minerals (CENTRUM SILVER PO) Take by mouth.    . nitrofurantoin (MACRODANTIN) 100 MG capsule Once a day. 30 capsule 3  . ondansetron (ZOFRAN) 4 MG tablet TAKE (1) TABLET BY MOUTH EVERY 8 HOURS AS NEEDED FOR NAUSEA/VOMITING 30 tablet 3  . pantoprazole (PROTONIX) 40 MG tablet Take 40 mg by mouth.    . triamcinolone ointment (KENALOG) 0.1 % Apply 1 application topically 2 (two) times daily. (Patient taking differently: Apply 1 application topically 2 (two) times daily as needed. ) 30 g 1   No current facility-administered medications for this visit.    Facility-Administered Medications Ordered in Other Visits  Medication Dose Route Frequency Provider Last Rate Last Dose  . diphenhydrAMINE (BENADRYL) injection 50 mg  50 mg Intravenous Once Forest Gleason, MD      . sodium chloride 0.9 % injection 10 mL  10 mL Intravenous PRN Forest Gleason, MD   10 mL at 01/12/15 1116  . sodium chloride 0.9 % injection 10 mL  10 mL Intravenous PRN Forest Gleason, MD   10 mL at 02/16/15 1025    PHYSICAL EXAMINATION: ECOG PERFORMANCE STATUS: 2 - Symptomatic, <50% confined to bed  BP 123/75 (BP Location: Left Arm, Patient Position: Sitting)   Pulse 62   Temp (!) 96.6 F (35.9 C) (Tympanic)   Resp 18   Wt 137 lb (62.1 kg)   BMI 26.76 kg/m   Filed Weights   04/19/16 0944  Weight: 137 lb (62.1 kg)    GENERAL: Well-nourished well-developed; Alert, no distress and comfortable.  She is walking herself. Accompanied by daughter. EYES: no pallor or icterus OROPHARYNX: no thrush or ulceration; good dentition  NECK: supple, no masses felt LYMPH:  no palpable lymphadenopathy in the cervical, axillary; Right ingiunal LN  ~2-3cm in size.  LUNGS: clear to auscultation and  No wheeze or crackles HEART/CVS: regular rate & rhythm and no murmurs; chronic right lower extremity swelling ABDOMEN:abdomen soft, non-tender and normal bowel sounds Musculoskeletal:no cyanosis of digits and no clubbing  PSYCH: alert & oriented x 3 with fluent speech NEURO: no focal motor/sensory deficits SKIN:  no rashes or significant lesions  LABORATORY DATA:  I have reviewed the data as listed    Component Value Date/Time   NA 138 04/19/2016 0922   NA 138 06/02/2014 0910   K 3.5 04/19/2016 0922   K 3.7 06/02/2014 0910   CL 105 04/19/2016 0922   CL 107 06/02/2014 0910   CO2 28  04/19/2016 0922   CO2 26 06/02/2014 0910   GLUCOSE 139 (H) 04/19/2016 0922   GLUCOSE 106 (H) 06/02/2014 0910   BUN 21 (H) 04/19/2016 0922   BUN 17 06/02/2014 0910   CREATININE 0.84 04/19/2016 0922   CREATININE 1.00 06/02/2014 0910   CALCIUM 8.7 (L) 04/19/2016 0922   CALCIUM 8.6 (L) 06/02/2014 0910   PROT 6.6 04/19/2016 0922   PROT 6.3 (L) 06/02/2014 0910   ALBUMIN 3.4 (L) 04/19/2016 0922   ALBUMIN 3.4 (L) 06/02/2014 0910   AST 26 04/19/2016 0922   AST 23 06/02/2014 0910   ALT 13 (L) 04/19/2016 0922   ALT 10 (L) 06/02/2014 0910   ALKPHOS 45 04/19/2016 0922   ALKPHOS 46 06/02/2014 0910   BILITOT 0.4 04/19/2016 0922   BILITOT 0.5 06/02/2014 0910   GFRNONAA >60 04/19/2016 0922   GFRNONAA 52 (L) 06/02/2014 0910   GFRAA >60 04/19/2016 0922   GFRAA >60 06/02/2014 0910    No results found for: SPEP, UPEP  Lab Results  Component Value Date   WBC 4.4 04/19/2016   NEUTROABS 2.4 04/19/2016   HGB 11.5 (L) 04/19/2016   HCT 34.0 (L) 04/19/2016   MCV 90.1 04/19/2016   PLT 388 04/19/2016      Chemistry      Component Value Date/Time   NA 138 04/19/2016 0922   NA 138 06/02/2014 0910   K 3.5 04/19/2016 0922   K 3.7 06/02/2014 0910   CL 105 04/19/2016 0922   CL 107 06/02/2014 0910   CO2 28 04/19/2016 0922   CO2 26 06/02/2014 0910   BUN 21  (H) 04/19/2016 0922   BUN 17 06/02/2014 0910   CREATININE 0.84 04/19/2016 0922   CREATININE 1.00 06/02/2014 0910      Component Value Date/Time   CALCIUM 8.7 (L) 04/19/2016 0922   CALCIUM 8.6 (L) 06/02/2014 0910   ALKPHOS 45 04/19/2016 0922   ALKPHOS 46 06/02/2014 0910   AST 26 04/19/2016 0922   AST 23 06/02/2014 0910   ALT 13 (L) 04/19/2016 0922   ALT 10 (L) 06/02/2014 0910   BILITOT 0.4 04/19/2016 0922   BILITOT 0.5 06/02/2014 0910     IMPRESSION: 1. No acute findings identified within the abdomen or pelvis. 2. Stable appearance of right external iliac and right inguinal metastatic adenopathy. 3. Large hiatal hernia. 4. Aortic atherosclerosis. 5. Stable small cystic lesion arising from tail of pancreas.   Electronically Signed   By: Kerby Moors M.D.   On: 02/15/2016 09:58  RADIOGRAPHIC STUDIES: I have personally reviewed the radiological images as listed and agreed with the findings in the report. No results found.   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 JAN 31st  2018-- CT scan- STABLE Lymphadenopathy in right groin. No clinical progression noted. Will plan CT scan in April-may 2018.  # proceed with AVASTIN today; Labs today reviewed;  acceptable for treatment today.  UA- no proteinuria.  #  Right R swelling-chronic ? Sec to LN-  STABLE.    # Elevated BP- improved; 123/85. Continue avastin.   # 37mm pancreatic lesion- monitor for now.  # Chronic UTIs- on macrobid.   # / follow up in 3 weeks/ cbc/cmp/UA/Avastin. Ca -125.    Orders Placed This Encounter  Procedures  . CA 125    Standing Status:   Future    Standing Expiration Date:   06/19/2016  [name], MD 04/20/2016 11:16 AM

## 2016-04-19 NOTE — Assessment & Plan Note (Addendum)
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 JAN 31st  2018-- CT scan- STABLE Lymphadenopathy in right groin. No clinical progression noted. Will plan CT scan in April-may 2018.  # proceed with AVASTIN today; Labs today reviewed;  acceptable for treatment today.  UA- no proteinuria.  #  Right R swelling-chronic ? Sec to LN-  STABLE.    # Elevated BP- improved; 123/85. Continue avastin.   # 55mm pancreatic lesion- monitor for now.  # Chronic UTIs- on macrobid.   # / follow up in 3 weeks/ cbc/cmp/UA/Avastin. Ca -125.

## 2016-04-25 ENCOUNTER — Encounter: Payer: Medicare HMO | Admitting: Obstetrics and Gynecology

## 2016-05-02 DIAGNOSIS — C5701 Malignant neoplasm of right fallopian tube: Secondary | ICD-10-CM | POA: Diagnosis not present

## 2016-05-02 DIAGNOSIS — E119 Type 2 diabetes mellitus without complications: Secondary | ICD-10-CM | POA: Diagnosis not present

## 2016-05-02 DIAGNOSIS — I10 Essential (primary) hypertension: Secondary | ICD-10-CM | POA: Diagnosis not present

## 2016-05-02 DIAGNOSIS — K219 Gastro-esophageal reflux disease without esophagitis: Secondary | ICD-10-CM | POA: Diagnosis not present

## 2016-05-02 DIAGNOSIS — G629 Polyneuropathy, unspecified: Secondary | ICD-10-CM | POA: Diagnosis not present

## 2016-05-02 DIAGNOSIS — E782 Mixed hyperlipidemia: Secondary | ICD-10-CM | POA: Diagnosis not present

## 2016-05-02 DIAGNOSIS — E559 Vitamin D deficiency, unspecified: Secondary | ICD-10-CM | POA: Diagnosis not present

## 2016-05-02 DIAGNOSIS — N390 Urinary tract infection, site not specified: Secondary | ICD-10-CM | POA: Diagnosis not present

## 2016-05-02 DIAGNOSIS — Z79899 Other long term (current) drug therapy: Secondary | ICD-10-CM | POA: Diagnosis not present

## 2016-05-02 DIAGNOSIS — I471 Supraventricular tachycardia: Secondary | ICD-10-CM | POA: Diagnosis not present

## 2016-05-09 ENCOUNTER — Inpatient Hospital Stay (HOSPITAL_BASED_OUTPATIENT_CLINIC_OR_DEPARTMENT_OTHER): Payer: Medicare HMO | Admitting: Internal Medicine

## 2016-05-09 ENCOUNTER — Inpatient Hospital Stay: Payer: Medicare HMO

## 2016-05-09 ENCOUNTER — Inpatient Hospital Stay: Payer: Medicare HMO | Attending: Internal Medicine

## 2016-05-09 VITALS — BP 142/78 | HR 61 | Temp 96.4°F | Resp 16 | Wt 135.0 lb

## 2016-05-09 DIAGNOSIS — Z9181 History of falling: Secondary | ICD-10-CM | POA: Insufficient documentation

## 2016-05-09 DIAGNOSIS — Z79899 Other long term (current) drug therapy: Secondary | ICD-10-CM | POA: Insufficient documentation

## 2016-05-09 DIAGNOSIS — K449 Diaphragmatic hernia without obstruction or gangrene: Secondary | ICD-10-CM | POA: Insufficient documentation

## 2016-05-09 DIAGNOSIS — Z90722 Acquired absence of ovaries, bilateral: Secondary | ICD-10-CM | POA: Insufficient documentation

## 2016-05-09 DIAGNOSIS — I341 Nonrheumatic mitral (valve) prolapse: Secondary | ICD-10-CM

## 2016-05-09 DIAGNOSIS — K869 Disease of pancreas, unspecified: Secondary | ICD-10-CM | POA: Insufficient documentation

## 2016-05-09 DIAGNOSIS — C774 Secondary and unspecified malignant neoplasm of inguinal and lower limb lymph nodes: Secondary | ICD-10-CM | POA: Diagnosis not present

## 2016-05-09 DIAGNOSIS — Z7982 Long term (current) use of aspirin: Secondary | ICD-10-CM

## 2016-05-09 DIAGNOSIS — Z8041 Family history of malignant neoplasm of ovary: Secondary | ICD-10-CM | POA: Diagnosis not present

## 2016-05-09 DIAGNOSIS — Z9221 Personal history of antineoplastic chemotherapy: Secondary | ICD-10-CM | POA: Diagnosis not present

## 2016-05-09 DIAGNOSIS — I700 Atherosclerosis of aorta: Secondary | ICD-10-CM | POA: Diagnosis not present

## 2016-05-09 DIAGNOSIS — R600 Localized edema: Secondary | ICD-10-CM

## 2016-05-09 DIAGNOSIS — I10 Essential (primary) hypertension: Secondary | ICD-10-CM | POA: Diagnosis not present

## 2016-05-09 DIAGNOSIS — K219 Gastro-esophageal reflux disease without esophagitis: Secondary | ICD-10-CM | POA: Insufficient documentation

## 2016-05-09 DIAGNOSIS — R490 Dysphonia: Secondary | ICD-10-CM

## 2016-05-09 DIAGNOSIS — Z87440 Personal history of urinary (tract) infections: Secondary | ICD-10-CM

## 2016-05-09 DIAGNOSIS — Z5112 Encounter for antineoplastic immunotherapy: Secondary | ICD-10-CM | POA: Diagnosis present

## 2016-05-09 DIAGNOSIS — N3281 Overactive bladder: Secondary | ICD-10-CM | POA: Diagnosis not present

## 2016-05-09 DIAGNOSIS — Z792 Long term (current) use of antibiotics: Secondary | ICD-10-CM | POA: Insufficient documentation

## 2016-05-09 DIAGNOSIS — C5701 Malignant neoplasm of right fallopian tube: Secondary | ICD-10-CM | POA: Insufficient documentation

## 2016-05-09 DIAGNOSIS — Z86718 Personal history of other venous thrombosis and embolism: Secondary | ICD-10-CM | POA: Insufficient documentation

## 2016-05-09 DIAGNOSIS — Z923 Personal history of irradiation: Secondary | ICD-10-CM

## 2016-05-09 DIAGNOSIS — Z90710 Acquired absence of both cervix and uterus: Secondary | ICD-10-CM | POA: Diagnosis not present

## 2016-05-09 LAB — CBC WITH DIFFERENTIAL/PLATELET
Basophils Absolute: 0.1 10*3/uL (ref 0–0.1)
Basophils Relative: 2 %
Eosinophils Absolute: 0 10*3/uL (ref 0–0.7)
Eosinophils Relative: 1 %
HEMATOCRIT: 34.7 % — AB (ref 35.0–47.0)
Hemoglobin: 11.6 g/dL — ABNORMAL LOW (ref 12.0–16.0)
LYMPHS ABS: 1.3 10*3/uL (ref 1.0–3.6)
Lymphocytes Relative: 28 %
MCH: 30.2 pg (ref 26.0–34.0)
MCHC: 33.6 g/dL (ref 32.0–36.0)
MCV: 90 fL (ref 80.0–100.0)
MONO ABS: 0.7 10*3/uL (ref 0.2–0.9)
MONOS PCT: 15 %
Neutro Abs: 2.5 10*3/uL (ref 1.4–6.5)
Neutrophils Relative %: 54 %
Platelets: 388 10*3/uL (ref 150–440)
RBC: 3.85 MIL/uL (ref 3.80–5.20)
RDW: 15.3 % — ABNORMAL HIGH (ref 11.5–14.5)
WBC: 4.5 10*3/uL (ref 3.6–11.0)

## 2016-05-09 LAB — COMPREHENSIVE METABOLIC PANEL
ALBUMIN: 3.5 g/dL (ref 3.5–5.0)
ALK PHOS: 47 U/L (ref 38–126)
ALT: 12 U/L — ABNORMAL LOW (ref 14–54)
AST: 24 U/L (ref 15–41)
Anion gap: 4 — ABNORMAL LOW (ref 5–15)
BILIRUBIN TOTAL: 0.5 mg/dL (ref 0.3–1.2)
BUN: 17 mg/dL (ref 6–20)
CALCIUM: 8.7 mg/dL — AB (ref 8.9–10.3)
CO2: 28 mmol/L (ref 22–32)
Chloride: 105 mmol/L (ref 101–111)
Creatinine, Ser: 0.76 mg/dL (ref 0.44–1.00)
GLUCOSE: 102 mg/dL — AB (ref 65–99)
Potassium: 3.8 mmol/L (ref 3.5–5.1)
Sodium: 137 mmol/L (ref 135–145)
TOTAL PROTEIN: 6.9 g/dL (ref 6.5–8.1)

## 2016-05-09 MED ORDER — SODIUM CHLORIDE 0.9 % IV SOLN
Freq: Once | INTRAVENOUS | Status: AC
  Administered 2016-05-09: 12:00:00 via INTRAVENOUS
  Filled 2016-05-09: qty 1000

## 2016-05-09 MED ORDER — SODIUM CHLORIDE 0.9 % IV SOLN
10.0000 mg/kg | Freq: Once | INTRAVENOUS | Status: AC
  Administered 2016-05-09: 625 mg via INTRAVENOUS
  Filled 2016-05-09: qty 16

## 2016-05-09 MED ORDER — SODIUM CHLORIDE 0.9 % IJ SOLN
10.0000 mL | INTRAMUSCULAR | Status: DC | PRN
  Administered 2016-05-09: 10 mL
  Filled 2016-05-09: qty 10

## 2016-05-09 MED ORDER — HEPARIN SOD (PORK) LOCK FLUSH 100 UNIT/ML IV SOLN
500.0000 [IU] | Freq: Once | INTRAVENOUS | Status: AC | PRN
  Administered 2016-05-09: 500 [IU]
  Filled 2016-05-09: qty 5

## 2016-05-09 NOTE — Progress Notes (Signed)
[place] OFFICE PROGRESS NOTE  Patient Care Team: Ezequiel Kayser, [name]  No matching staging information was found for the patient.   Oncology History   . 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  Adequate TRS, no macroscopic residual. IP/IV chemotherapy with DDP and paclitaxel on GOG protocol, chemo and Bev consolidation completed in 03/2011 2. 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 November 14, 2011- Patient was started on carboplatin and gemcitabine 4. Finished 6 cycles of chemotherapy in April of 2014 with carboplatin and gemcitabine. Tolerance was fairly good except for neutropenia and thrombocytopenia. 5.recurrent disease by CT scan February of 2015  # .radiation therapy to pelvis  May of 2015  7.upper extremity  . and deep vein thrombosis associated with port.(June of 2015) patient started on   [place] had port removed and had   thrombectomy September 06, 2013. # CT DEC 2016- progressing disease in the right inguinal area # FEB-TAXOL-AVASTIN;  # FEB 2017- AVASTIN q 3W; July 2017- CT- Improved  Right Inguinal LN; STOP AVASTIN sec to potential concerns of AEs  # AUG 2017 3rd- START ZEJULA- declines sec to intol  # OCT 7th CT- STABLE RIGHT INGUINAL LN- RE-START AVASTIN q 3W       Malignant neoplasm of right fallopian tube ([place])   10/05/2009 Initial Diagnosis    Carcinoma of fallopian tube        INTERVAL HISTORY:  Nicole Clayton 83 y.o.  female pleasant patient above history of Fallopian tube cancer metastatic currently  Avastin is here for follow-up.   Denies any symptoms suggestive of stroke. Denies any headaches. No epistaxis. Denies any pain.  She continues to chronic swelling in the left leg. Otherwise no weight loss or abdominal bloating. Patient had a fall in her hand had a bruise. Currently improving.  REVIEW OF SYSTEMS:  A  complete 10 point review of system is done which is negative except mentioned above/history of present illness. Complains of hoarseness of voice.  PAST MEDICAL HISTORY :  Past Medical History:  Diagnosis Date  . Bladder infection, chronic 10/19/2011  . Cancer (HCC)    Ovarian   . Carcinoma of fallopian tube ([place]) 10/05/2009   Overview:  Overview:  Overview:  Drs. Claiborne Rigg and Delorise Shiner Choksi Overview:  Drs. Claiborne Rigg and [employer]   . GERD (gastroesophageal reflux disease)   . MI (mitral incompetence) 10/27/2014   Overview:  MODERATE   . Mitral valve prolapse   . OAB (overactive bladder)   . Tachycardia     PAST SURGICAL HISTORY :   Past Surgical History:  Procedure Laterality Date  . ABDOMINAL HYSTERECTOMY    . CHOLECYSTECTOMY    . LAPAROSCOPIC BILATERAL SALPINGO OOPHERECTOMY  2011  . ovarian cancer surgery  2011    FAMILY HISTORY :   Family History  Problem Relation Age of Onset  . Ovarian cancer Other   . Breast cancer Neg Hx   . Colon cancer Neg Hx   . Diabetes Neg Hx   . Heart disease Neg Hx   . Kidney disease Neg Hx   . Bladder Cancer Neg Hx     SOCIAL HISTORY:   Social History  Substance Use Topics  . Smoking status: Never Smoker  . Smokeless tobacco: Never Used  . Alcohol use No    ALLERGIES:  is allergic to benadryl [diphenhydramine]; tamsulosin; alendronate sodium;  duloxetine hcl; risedronate sodium; lovastatin; and sulfa antibiotics.  MEDICATIONS:  Current Outpatient Prescriptions  Medication Sig Dispense Refill  . acetaminophen (TYLENOL) 500 MG tablet Take 1 tablet (500 mg total) by mouth every 6 (six) hours as needed. 30 tablet 0  . Ascorbic Acid (VITAMIN C) 100 MG tablet Take 100 mg by mouth daily.    Marland Kitchen aspirin EC 81 MG tablet Take 81 mg by mouth.    . cetirizine (ZYRTEC) 10 MG tablet Take 10 mg by mouth daily.     . Cholecalciferol (VITAMIN D3) 2000 UNITS capsule Take 2,000 Units by mouth daily.     Marland Kitchen docusate sodium (COLACE) 100 MG  capsule Take 100 mg by mouth.    . fluticasone (FLONASE) 50 MCG/ACT nasal spray Place 1 spray into both nostrils daily.     Marland Kitchen gabapentin (NEURONTIN) 600 MG tablet TAKE (1) TABLET BY MOUTH THREE TIMES A DAY 180 tablet 0  . metoprolol succinate (TOPROL-XL) 50 MG 24 hr tablet Take 50 mg by mouth daily.    . Multiple Vitamins-Minerals (CENTRUM SILVER PO) Take by mouth.    . nitrofurantoin, macrocrystal-monohydrate, (MACROBID) 100 MG capsule Take 100 mg by mouth daily.    . ondansetron (ZOFRAN) 4 MG tablet TAKE (1) TABLET BY MOUTH EVERY 8 HOURS AS NEEDED FOR NAUSEA/VOMITING 30 tablet 3  . pantoprazole (PROTONIX) 40 MG tablet Take 40 mg by mouth.    . triamcinolone ointment (KENALOG) 0.1 % Apply 1 application topically 2 (two) times daily. (Patient taking differently: Apply 1 application topically 2 (two) times daily as needed. ) 30 g 1   No current facility-administered medications for this visit.    Facility-Administered Medications Ordered in Other Visits  Medication Dose Route Frequency Provider Last Rate Last Dose  . diphenhydrAMINE (BENADRYL) injection 50 mg  50 mg Intravenous Once Forest Gleason, MD      . sodium chloride 0.9 % injection 10 mL  10 mL Intravenous PRN Forest Gleason, MD   10 mL at 01/12/15 1116  . sodium chloride 0.9 % injection 10 mL  10 mL Intravenous PRN Forest Gleason, MD   10 mL at 02/16/15 1025    PHYSICAL EXAMINATION: ECOG PERFORMANCE STATUS: 2 - Symptomatic, <50% confined to bed  BP (!) 142/78 (BP Location: Left Arm, Patient Position: Sitting)   Pulse 61   Temp (!) 96.4 F (35.8 C) (Tympanic)   Resp 16   Wt 135 lb (61.2 kg)   BMI 26.37 kg/m   Filed Weights   05/09/16 1117  Weight: 135 lb (61.2 kg)    GENERAL: Well-nourished well-developed; Alert, no distress and comfortable.  She is walking herself. Accompanied by daughter. EYES: no pallor or icterus OROPHARYNX: no thrush or ulceration; good dentition  NECK: supple, no masses felt LYMPH:  no palpable  lymphadenopathy in the cervical, axillary; Right ingiunal LN ~2-3cm in size.  LUNGS: clear to auscultation and  No wheeze or crackles HEART/CVS: regular rate & rhythm and no murmurs; chronic right lower extremity swelling ABDOMEN:abdomen soft, non-tender and normal bowel sounds Musculoskeletal:no cyanosis of digits and no clubbing  PSYCH: alert & oriented x 3 with fluent speech NEURO: no focal motor/sensory deficits SKIN:  no rashes or significant lesions- except for a bruise noted on the right dorsal hand.  LABORATORY DATA:  I have reviewed the data as listed    Component Value Date/Time   NA 137 05/09/2016 1037   NA 138 06/02/2014 0910   K 3.8 05/09/2016 1037   K 3.7  06/02/2014 0910   CL 105 05/09/2016 1037   CL 107 06/02/2014 0910   CO2 28 05/09/2016 1037   CO2 26 06/02/2014 0910   GLUCOSE 102 (H) 05/09/2016 1037   GLUCOSE 106 (H) 06/02/2014 0910   BUN 17 05/09/2016 1037   BUN 17 06/02/2014 0910   CREATININE 0.76 05/09/2016 1037   CREATININE 1.00 06/02/2014 0910   CALCIUM 8.7 (L) 05/09/2016 1037   CALCIUM 8.6 (L) 06/02/2014 0910   PROT 6.9 05/09/2016 1037   PROT 6.3 (L) 06/02/2014 0910   ALBUMIN 3.5 05/09/2016 1037   ALBUMIN 3.4 (L) 06/02/2014 0910   AST 24 05/09/2016 1037   AST 23 06/02/2014 0910   ALT 12 (L) 05/09/2016 1037   ALT 10 (L) 06/02/2014 0910   ALKPHOS 47 05/09/2016 1037   ALKPHOS 46 06/02/2014 0910   BILITOT 0.5 05/09/2016 1037   BILITOT 0.5 06/02/2014 0910   GFRNONAA >60 05/09/2016 1037   GFRNONAA 52 (L) 06/02/2014 0910   GFRAA >60 05/09/2016 1037   GFRAA >60 06/02/2014 0910    No results found for: SPEP, UPEP  Lab Results  Component Value Date   WBC 4.5 05/09/2016   NEUTROABS 2.5 05/09/2016   HGB 11.6 (L) 05/09/2016   HCT 34.7 (L) 05/09/2016   MCV 90.0 05/09/2016   PLT 388 05/09/2016      Chemistry      Component Value Date/Time   NA 137 05/09/2016 1037   NA 138 06/02/2014 0910   K 3.8 05/09/2016 1037   K 3.7 06/02/2014 0910   CL 105  05/09/2016 1037   CL 107 06/02/2014 0910   CO2 28 05/09/2016 1037   CO2 26 06/02/2014 0910   BUN 17 05/09/2016 1037   BUN 17 06/02/2014 0910   CREATININE 0.76 05/09/2016 1037   CREATININE 1.00 06/02/2014 0910      Component Value Date/Time   CALCIUM 8.7 (L) 05/09/2016 1037   CALCIUM 8.6 (L) 06/02/2014 0910   ALKPHOS 47 05/09/2016 1037   ALKPHOS 46 06/02/2014 0910   AST 24 05/09/2016 1037   AST 23 06/02/2014 0910   ALT 12 (L) 05/09/2016 1037   ALT 10 (L) 06/02/2014 0910   BILITOT 0.5 05/09/2016 1037   BILITOT 0.5 06/02/2014 0910     IMPRESSION: 1. No acute findings identified within the abdomen or pelvis. 2. Stable appearance of right external iliac and right inguinal metastatic adenopathy. 3. Large hiatal hernia. 4. Aortic atherosclerosis. 5. Stable small cystic lesion arising from tail of pancreas.   Electronically Signed   By: Kerby Moors M.D.   On: 02/15/2016 09:58  RADIOGRAPHIC STUDIES: I have personally reviewed the radiological images as listed and agreed with the findings in the report. No results found.   ASSESSMENT & PLAN:  Malignant neoplasm of right fallopian tube [facility]) Metastatic fallopian tube cancer- currently on Avastin since February 2017. JAN 31st  2018-- CT scan- STABLE Lymphadenopathy in right groin. No clinical progression noted.   # proceed with AVASTIN today; Labs today reviewed;  acceptable for treatment today.  UA- no proteinuria.  #  Right R swelling-chronic ? Sec to LN-  STABLE.    # Elevated BP- improved; 140s/85. Continue avastin.   # 70mm pancreatic lesion- monitor for now.  # Chronic UTIs- on macrobid.   # / follow up in 3 weeks/ cbc/cmp/UA/Avastin. Ca -125.; CT scan later in may/ pt wants to wait; ? Early June 2018.    No orders of the defined types were placed in  this encounter.     [name], MD 05/09/2016 11:59 AM

## 2016-05-09 NOTE — Progress Notes (Signed)
Patient here today for follow up.  Patient states no new concerns today  

## 2016-05-09 NOTE — Assessment & Plan Note (Addendum)
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 JAN 31st  2018-- CT scan- STABLE Lymphadenopathy in right groin. No clinical progression noted.   # proceed with AVASTIN today; Labs today reviewed;  acceptable for treatment today.  UA- no proteinuria.  #  Right R swelling-chronic ? Sec to LN-  STABLE.    # Elevated BP- improved; 140s/85. Continue avastin.   # 69mm pancreatic lesion- monitor for now.  # Chronic UTIs- on macrobid.   # / follow up in 3 weeks/ cbc/cmp/UA/Avastin. Ca -125.; CT scan later in may/ pt wants to wait; ? Early June 2018.

## 2016-05-10 ENCOUNTER — Ambulatory Visit: Payer: Medicare HMO | Admitting: Internal Medicine

## 2016-05-10 ENCOUNTER — Ambulatory Visit: Payer: Medicare HMO

## 2016-05-10 ENCOUNTER — Other Ambulatory Visit: Payer: Medicare HMO

## 2016-05-10 LAB — CA 125: CA 125: 18.7 U/mL (ref 0.0–38.1)

## 2016-05-13 DIAGNOSIS — N390 Urinary tract infection, site not specified: Secondary | ICD-10-CM | POA: Diagnosis not present

## 2016-05-30 ENCOUNTER — Ambulatory Visit: Payer: Medicare HMO | Admitting: Internal Medicine

## 2016-05-30 ENCOUNTER — Other Ambulatory Visit: Payer: Medicare HMO

## 2016-05-30 ENCOUNTER — Ambulatory Visit: Payer: Medicare HMO

## 2016-05-31 ENCOUNTER — Inpatient Hospital Stay: Payer: Medicare HMO | Admitting: *Deleted

## 2016-05-31 ENCOUNTER — Inpatient Hospital Stay: Payer: Medicare HMO

## 2016-05-31 ENCOUNTER — Inpatient Hospital Stay (HOSPITAL_BASED_OUTPATIENT_CLINIC_OR_DEPARTMENT_OTHER): Payer: Medicare HMO | Admitting: Internal Medicine

## 2016-05-31 VITALS — BP 133/78 | HR 66 | Temp 97.6°F | Resp 16 | Wt 136.0 lb

## 2016-05-31 DIAGNOSIS — I10 Essential (primary) hypertension: Secondary | ICD-10-CM

## 2016-05-31 DIAGNOSIS — K219 Gastro-esophageal reflux disease without esophagitis: Secondary | ICD-10-CM

## 2016-05-31 DIAGNOSIS — R490 Dysphonia: Secondary | ICD-10-CM

## 2016-05-31 DIAGNOSIS — I341 Nonrheumatic mitral (valve) prolapse: Secondary | ICD-10-CM | POA: Diagnosis not present

## 2016-05-31 DIAGNOSIS — Z923 Personal history of irradiation: Secondary | ICD-10-CM

## 2016-05-31 DIAGNOSIS — Z9181 History of falling: Secondary | ICD-10-CM

## 2016-05-31 DIAGNOSIS — Z90722 Acquired absence of ovaries, bilateral: Secondary | ICD-10-CM

## 2016-05-31 DIAGNOSIS — C5701 Malignant neoplasm of right fallopian tube: Secondary | ICD-10-CM

## 2016-05-31 DIAGNOSIS — Z7982 Long term (current) use of aspirin: Secondary | ICD-10-CM

## 2016-05-31 DIAGNOSIS — K869 Disease of pancreas, unspecified: Secondary | ICD-10-CM

## 2016-05-31 DIAGNOSIS — R600 Localized edema: Secondary | ICD-10-CM | POA: Diagnosis not present

## 2016-05-31 DIAGNOSIS — Z79899 Other long term (current) drug therapy: Secondary | ICD-10-CM

## 2016-05-31 DIAGNOSIS — C5700 Malignant neoplasm of unspecified fallopian tube: Secondary | ICD-10-CM

## 2016-05-31 DIAGNOSIS — N390 Urinary tract infection, site not specified: Secondary | ICD-10-CM

## 2016-05-31 DIAGNOSIS — Z9221 Personal history of antineoplastic chemotherapy: Secondary | ICD-10-CM

## 2016-05-31 DIAGNOSIS — K449 Diaphragmatic hernia without obstruction or gangrene: Secondary | ICD-10-CM | POA: Diagnosis not present

## 2016-05-31 DIAGNOSIS — Z87440 Personal history of urinary (tract) infections: Secondary | ICD-10-CM

## 2016-05-31 DIAGNOSIS — Z8041 Family history of malignant neoplasm of ovary: Secondary | ICD-10-CM

## 2016-05-31 DIAGNOSIS — Z5112 Encounter for antineoplastic immunotherapy: Secondary | ICD-10-CM | POA: Diagnosis not present

## 2016-05-31 DIAGNOSIS — C774 Secondary and unspecified malignant neoplasm of inguinal and lower limb lymph nodes: Secondary | ICD-10-CM

## 2016-05-31 DIAGNOSIS — Z792 Long term (current) use of antibiotics: Secondary | ICD-10-CM

## 2016-05-31 DIAGNOSIS — I700 Atherosclerosis of aorta: Secondary | ICD-10-CM

## 2016-05-31 DIAGNOSIS — Z90710 Acquired absence of both cervix and uterus: Secondary | ICD-10-CM | POA: Diagnosis not present

## 2016-05-31 DIAGNOSIS — Z86718 Personal history of other venous thrombosis and embolism: Secondary | ICD-10-CM

## 2016-05-31 DIAGNOSIS — N3281 Overactive bladder: Secondary | ICD-10-CM

## 2016-05-31 LAB — COMPREHENSIVE METABOLIC PANEL
ALK PHOS: 52 U/L (ref 38–126)
ALT: 13 U/L — ABNORMAL LOW (ref 14–54)
ANION GAP: 4 — AB (ref 5–15)
AST: 27 U/L (ref 15–41)
Albumin: 3.6 g/dL (ref 3.5–5.0)
BILIRUBIN TOTAL: 0.6 mg/dL (ref 0.3–1.2)
BUN: 18 mg/dL (ref 6–20)
CO2: 28 mmol/L (ref 22–32)
Calcium: 9.2 mg/dL (ref 8.9–10.3)
Chloride: 105 mmol/L (ref 101–111)
Creatinine, Ser: 0.98 mg/dL (ref 0.44–1.00)
GFR, EST AFRICAN AMERICAN: 59 mL/min — AB (ref >60)
GFR, EST NON AFRICAN AMERICAN: 51 mL/min — AB (ref >60)
GLUCOSE: 104 mg/dL — AB (ref 65–99)
Potassium: 3.9 mmol/L (ref 3.5–5.1)
Sodium: 137 mmol/L (ref 135–145)
TOTAL PROTEIN: 6.9 g/dL (ref 6.5–8.1)

## 2016-05-31 LAB — CBC WITH DIFFERENTIAL/PLATELET
Basophils Absolute: 0 10*3/uL (ref 0–0.1)
Basophils Relative: 1 %
Eosinophils Absolute: 0.1 10*3/uL (ref 0–0.7)
Eosinophils Relative: 2 %
HEMATOCRIT: 35.1 % (ref 35.0–47.0)
HEMOGLOBIN: 11.7 g/dL — AB (ref 12.0–16.0)
LYMPHS ABS: 1.4 10*3/uL (ref 1.0–3.6)
Lymphocytes Relative: 34 %
MCH: 30.3 pg (ref 26.0–34.0)
MCHC: 33.4 g/dL (ref 32.0–36.0)
MCV: 90.6 fL (ref 80.0–100.0)
MONO ABS: 0.6 10*3/uL (ref 0.2–0.9)
MONOS PCT: 14 %
NEUTROS ABS: 2.1 10*3/uL (ref 1.4–6.5)
NEUTROS PCT: 49 %
Platelets: 409 10*3/uL (ref 150–440)
RBC: 3.87 MIL/uL (ref 3.80–5.20)
RDW: 15.3 % — AB (ref 11.5–14.5)
WBC: 4.1 10*3/uL (ref 3.6–11.0)

## 2016-05-31 LAB — URINALYSIS, COMPLETE (UACMP) WITH MICROSCOPIC
BACTERIA UA: NONE SEEN
BILIRUBIN URINE: NEGATIVE
Glucose, UA: NEGATIVE mg/dL
Hgb urine dipstick: NEGATIVE
Ketones, ur: NEGATIVE mg/dL
Nitrite: NEGATIVE
Protein, ur: NEGATIVE mg/dL
SPECIFIC GRAVITY, URINE: 1.012 (ref 1.005–1.030)
pH: 6 (ref 5.0–8.0)

## 2016-05-31 MED ORDER — SODIUM CHLORIDE 0.9% FLUSH
10.0000 mL | INTRAVENOUS | Status: DC | PRN
  Administered 2016-05-31: 10 mL via INTRAVENOUS
  Filled 2016-05-31: qty 10

## 2016-05-31 MED ORDER — SODIUM CHLORIDE 0.9 % IV SOLN
Freq: Once | INTRAVENOUS | Status: AC
  Administered 2016-05-31: 12:00:00 via INTRAVENOUS
  Filled 2016-05-31: qty 1000

## 2016-05-31 MED ORDER — ONDANSETRON HCL 4 MG PO TABS: ORAL_TABLET | ORAL | 3 refills | Status: DC

## 2016-05-31 MED ORDER — HEPARIN SOD (PORK) LOCK FLUSH 100 UNIT/ML IV SOLN
INTRAVENOUS | Status: AC
  Filled 2016-05-31: qty 5

## 2016-05-31 MED ORDER — HEPARIN SOD (PORK) LOCK FLUSH 100 UNIT/ML IV SOLN
500.0000 [IU] | Freq: Once | INTRAVENOUS | Status: AC
  Administered 2016-05-31: 500 [IU] via INTRAVENOUS

## 2016-05-31 MED ORDER — BEVACIZUMAB CHEMO INJECTION 400 MG/16ML
10.0000 mg/kg | Freq: Once | INTRAVENOUS | Status: AC
  Administered 2016-05-31: 625 mg via INTRAVENOUS
  Filled 2016-05-31: qty 16

## 2016-05-31 NOTE — Progress Notes (Signed)
[place] OFFICE PROGRESS NOTE  Patient Care Team: Ezequiel Kayser, [name]  No matching staging information was found for the patient.   Oncology History   . 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  Adequate TRS, no macroscopic residual. IP/IV chemotherapy with DDP and paclitaxel on GOG protocol, chemo and Bev consolidation completed in 03/2011 2. 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 November 14, 2011- Patient was started on carboplatin and gemcitabine 4. Finished 6 cycles of chemotherapy in April of 2014 with carboplatin and gemcitabine. Tolerance was fairly good except for neutropenia and thrombocytopenia. 5.recurrent disease by CT scan February of 2015  # .radiation therapy to pelvis  May of 2015  7.upper extremity  . and deep vein thrombosis associated with port.(June of 2015) patient started on   [place] had port removed and had   thrombectomy September 06, 2013. # CT DEC 2016- progressing disease in the right inguinal area # FEB-TAXOL-AVASTIN;  # FEB 2017- AVASTIN q 3W; July 2017- CT- Improved  Right Inguinal LN; STOP AVASTIN sec to potential concerns of AEs  # AUG 2017 3rd- START ZEJULA- declines sec to intol  # OCT 7th CT- STABLE RIGHT INGUINAL LN- RE-START AVASTIN q 3W       Malignant neoplasm of right fallopian tube ([place])   10/05/2009 Initial Diagnosis    Carcinoma of fallopian tube        INTERVAL HISTORY:  Nicole Clayton 83 y.o.  female pleasant patient above history of Fallopian tube cancer metastatic currently  Avastin is here for follow-up.  Patient complains of bruise of the right hand- which is not completely healed. Otherwise denies any new symptoms of headache or epistaxis.  Denies any pain.  She continues to chronic swelling in the left leg. Otherwise no weight loss or abdominal bloating. No new falls. Patient is looking  forward to her grandsons marriage in end of May 2018.  REVIEW OF SYSTEMS:  A complete 10 point review of system is done which is negative except mentioned above/history of present illness. Complains of hoarseness of voice.  PAST MEDICAL HISTORY :  Past Medical History:  Diagnosis Date  . Bladder infection, chronic 10/19/2011  . Cancer (HCC)    Ovarian   . Carcinoma of fallopian tube ([place]) 10/05/2009   Overview:  Overview:  Overview:  Drs. Claiborne Rigg and Delorise Shiner Choksi Overview:  Drs. Claiborne Rigg and [employer]   . GERD (gastroesophageal reflux disease)   . MI (mitral incompetence) 10/27/2014   Overview:  MODERATE   . Mitral valve prolapse   . OAB (overactive bladder)   . Tachycardia     PAST SURGICAL HISTORY :   Past Surgical History:  Procedure Laterality Date  . ABDOMINAL HYSTERECTOMY    . CHOLECYSTECTOMY    . LAPAROSCOPIC BILATERAL SALPINGO OOPHERECTOMY  2011  . ovarian cancer surgery  2011    FAMILY HISTORY :   Family History  Problem Relation Age of Onset  . Ovarian cancer Other   . Breast cancer Neg Hx   . Colon cancer Neg Hx   . Diabetes Neg Hx   . Heart disease Neg Hx   . Kidney disease Neg Hx   . Bladder Cancer Neg Hx     SOCIAL HISTORY:   Social History  Substance Use Topics  . Smoking status: Never Smoker  . Smokeless tobacco: Never Used  . Alcohol  use No    ALLERGIES:  is allergic to benadryl [diphenhydramine]; tamsulosin; alendronate sodium; duloxetine hcl; risedronate sodium; lovastatin; and sulfa antibiotics.  MEDICATIONS:  Current Outpatient Prescriptions  Medication Sig Dispense Refill  . acetaminophen (TYLENOL) 500 MG tablet Take 1 tablet (500 mg total) by mouth every 6 (six) hours as needed. 30 tablet 0  . Ascorbic Acid (VITAMIN C) 100 MG tablet Take 100 mg by mouth daily.    Marland Kitchen aspirin EC 81 MG tablet Take 81 mg by mouth.    . cetirizine (ZYRTEC) 10 MG tablet Take 10 mg by mouth daily.     . Cholecalciferol (VITAMIN D3) 2000 UNITS  capsule Take 2,000 Units by mouth daily.     Marland Kitchen docusate sodium (COLACE) 100 MG capsule Take 100 mg by mouth.    . fluticasone (FLONASE) 50 MCG/ACT nasal spray Place 1 spray into both nostrils daily.     Marland Kitchen gabapentin (NEURONTIN) 600 MG tablet TAKE (1) TABLET BY MOUTH THREE TIMES A DAY 180 tablet 0  . metoprolol succinate (TOPROL-XL) 50 MG 24 hr tablet Take 50 mg by mouth daily.    . Multiple Vitamins-Minerals (CENTRUM SILVER PO) Take by mouth.    . nitrofurantoin, macrocrystal-monohydrate, (MACROBID) 100 MG capsule Take 100 mg by mouth daily.    . ondansetron (ZOFRAN) 4 MG tablet TAKE (1) TABLET BY MOUTH EVERY 8 HOURS AS NEEDED FOR NAUSEA/VOMITING 30 tablet 3  . pantoprazole (PROTONIX) 40 MG tablet Take 40 mg by mouth.    . triamcinolone ointment (KENALOG) 0.1 % Apply 1 application topically 2 (two) times daily. (Patient taking differently: Apply 1 application topically 2 (two) times daily as needed. ) 30 g 1   No current facility-administered medications for this visit.    Facility-Administered Medications Ordered in Other Visits  Medication Dose Route Frequency Provider Last Rate Last Dose  . diphenhydrAMINE (BENADRYL) injection 50 mg  50 mg Intravenous Once Forest Gleason, MD      . sodium chloride 0.9 % injection 10 mL  10 mL Intravenous PRN Forest Gleason, MD   10 mL at 01/12/15 1116  . sodium chloride 0.9 % injection 10 mL  10 mL Intravenous PRN Forest Gleason, MD   10 mL at 02/16/15 1025  . sodium chloride flush (NS) 0.9 % injection 10 mL  10 mL Intravenous PRN [name], MD   10 mL at 05/31/16 1030    PHYSICAL EXAMINATION: ECOG PERFORMANCE STATUS: 2 - Symptomatic, <50% confined to bed  BP 133/78 (BP Location: Left Arm, Patient Position: Sitting)   Pulse 66   Temp 97.6 F (36.4 C) (Tympanic)   Resp 16   Wt 136 lb (61.7 kg)   BMI 26.56 kg/m   Filed Weights   05/31/16 1116  Weight: 136 lb (61.7 kg)    GENERAL: Well-nourished well-developed; Alert, no distress and  comfortable.  She is walking herself. Accompanied by daughter. EYES: no pallor or icterus OROPHARYNX: no thrush or ulceration; good dentition  NECK: supple, no masses felt LYMPH:  no palpable lymphadenopathy in the cervical, axillary; Right ingiunal LN ~2-3cm in size.  LUNGS: clear to auscultation and  No wheeze or crackles HEART/CVS: regular rate & rhythm and no murmurs; chronic right lower extremity swelling ABDOMEN:abdomen soft, non-tender and normal bowel sounds Musculoskeletal:no cyanosis of digits and no clubbing  PSYCH: alert & oriented x 3 with fluent speech NEURO: no focal motor/sensory deficits SKIN:  no rashes or significant lesions- except for a bruise noted on the right dorsal  hand/improving.  LABORATORY DATA:  I have reviewed the data as listed    Component Value Date/Time   NA 137 05/31/2016 1025   NA 138 06/02/2014 0910   K 3.9 05/31/2016 1025   K 3.7 06/02/2014 0910   CL 105 05/31/2016 1025   CL 107 06/02/2014 0910   CO2 28 05/31/2016 1025   CO2 26 06/02/2014 0910   GLUCOSE 104 (H) 05/31/2016 1025   GLUCOSE 106 (H) 06/02/2014 0910   BUN 18 05/31/2016 1025   BUN 17 06/02/2014 0910   CREATININE 0.98 05/31/2016 1025   CREATININE 1.00 06/02/2014 0910   CALCIUM 9.2 05/31/2016 1025   CALCIUM 8.6 (L) 06/02/2014 0910   PROT 6.9 05/31/2016 1025   PROT 6.3 (L) 06/02/2014 0910   ALBUMIN 3.6 05/31/2016 1025   ALBUMIN 3.4 (L) 06/02/2014 0910   AST 27 05/31/2016 1025   AST 23 06/02/2014 0910   ALT 13 (L) 05/31/2016 1025   ALT 10 (L) 06/02/2014 0910   ALKPHOS 52 05/31/2016 1025   ALKPHOS 46 06/02/2014 0910   BILITOT 0.6 05/31/2016 1025   BILITOT 0.5 06/02/2014 0910   GFRNONAA 51 (L) 05/31/2016 1025   GFRNONAA 52 (L) 06/02/2014 0910   GFRAA 59 (L) 05/31/2016 1025   GFRAA >60 06/02/2014 0910    No results found for: SPEP, UPEP  Lab Results  Component Value Date   WBC 4.1 05/31/2016   NEUTROABS 2.1 05/31/2016   HGB 11.7 (L) 05/31/2016   HCT 35.1 05/31/2016    MCV 90.6 05/31/2016   PLT 409 05/31/2016      Chemistry      Component Value Date/Time   NA 137 05/31/2016 1025   NA 138 06/02/2014 0910   K 3.9 05/31/2016 1025   K 3.7 06/02/2014 0910   CL 105 05/31/2016 1025   CL 107 06/02/2014 0910   CO2 28 05/31/2016 1025   CO2 26 06/02/2014 0910   BUN 18 05/31/2016 1025   BUN 17 06/02/2014 0910   CREATININE 0.98 05/31/2016 1025   CREATININE 1.00 06/02/2014 0910      Component Value Date/Time   CALCIUM 9.2 05/31/2016 1025   CALCIUM 8.6 (L) 06/02/2014 0910   ALKPHOS 52 05/31/2016 1025   ALKPHOS 46 06/02/2014 0910   AST 27 05/31/2016 1025   AST 23 06/02/2014 0910   ALT 13 (L) 05/31/2016 1025   ALT 10 (L) 06/02/2014 0910   BILITOT 0.6 05/31/2016 1025   BILITOT 0.5 06/02/2014 0910     IMPRESSION: 1. No acute findings identified within the abdomen or pelvis. 2. Stable appearance of right external iliac and right inguinal metastatic adenopathy. 3. Large hiatal hernia. 4. Aortic atherosclerosis. 5. Stable small cystic lesion arising from tail of pancreas.   Electronically Signed   By: Kerby Moors M.D.   On: 02/15/2016 09:58  RADIOGRAPHIC STUDIES: I have personally reviewed the radiological images as listed and agreed with the findings in the report. No results found.   ASSESSMENT & PLAN:  Malignant neoplasm of right fallopian tube [facility]) Metastatic fallopian tube cancer- currently on Avastin since February 2017. JAN 31st  2018-- CT scan- STABLE Lymphadenopathy in right groin. No clinical progression noted. Recent Ca-125-normal at 18.  # proceed with AVASTIN today; Labs today reviewed;  acceptable for treatment today.  UA- no proteinuria.  #  Right R swelling-chronic ? Sec to LN-  STABLE.    # Elevated BP- improved; 130/80s.  Continue avastin.   # 15mm pancreatic lesion- monitor for now.  Await CT scan again in late June 2018.   # Chronic UTIs- on macrobid; none at this time.   # / follow up in 3 weeks/  cbc/cmp/UA/Avastin. Ca -125.; will give break on June 3rd; see pt on June 29th CT scan few days prior [ordered today].    Orders Placed This Encounter  Procedures  . CT ABDOMEN PELVIS W CONTRAST    Standing Status:   Future    Standing Expiration Date:   08/30/2017    Order Specific Question:   Reason for Exam (SYMPTOM  OR DIAGNOSIS REQUIRED)    Answer:   ovarian cancer    Order Specific Question:   Preferred imaging location?    Answer:   [facility]      [name], MD 06/01/2016 10:30 AM

## 2016-05-31 NOTE — Assessment & Plan Note (Addendum)
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 JAN 31st  2018-- CT scan- STABLE Lymphadenopathy in right groin. No clinical progression noted. Recent Ca-125-normal at 18.  # proceed with AVASTIN today; Labs today reviewed;  acceptable for treatment today.  UA- no proteinuria.  #  Right R swelling-chronic ? Sec to LN-  STABLE.    # Elevated BP- improved; 130/80s.  Continue avastin.   # 65mm pancreatic lesion- monitor for now. Await CT scan again in late June 2018.   # Chronic UTIs- on macrobid; none at this time.   # / follow up in 3 weeks/ cbc/cmp/UA/Avastin. Ca -125.; will give break on June 3rd; see pt on June 29th CT scan few days prior [ordered today].

## 2016-05-31 NOTE — Progress Notes (Signed)
Patient here today for follow up.  Patient is concerned about red area on right hand

## 2016-06-21 ENCOUNTER — Inpatient Hospital Stay: Payer: Medicare HMO | Attending: Internal Medicine

## 2016-06-21 ENCOUNTER — Inpatient Hospital Stay (HOSPITAL_BASED_OUTPATIENT_CLINIC_OR_DEPARTMENT_OTHER): Payer: Medicare HMO | Admitting: Internal Medicine

## 2016-06-21 ENCOUNTER — Inpatient Hospital Stay: Payer: Medicare HMO

## 2016-06-21 VITALS — BP 156/86 | HR 69 | Temp 97.6°F | Resp 18 | Ht 60.0 in | Wt 138.5 lb

## 2016-06-21 DIAGNOSIS — K219 Gastro-esophageal reflux disease without esophagitis: Secondary | ICD-10-CM

## 2016-06-21 DIAGNOSIS — Z90722 Acquired absence of ovaries, bilateral: Secondary | ICD-10-CM | POA: Insufficient documentation

## 2016-06-21 DIAGNOSIS — Z8041 Family history of malignant neoplasm of ovary: Secondary | ICD-10-CM

## 2016-06-21 DIAGNOSIS — C774 Secondary and unspecified malignant neoplasm of inguinal and lower limb lymph nodes: Secondary | ICD-10-CM | POA: Diagnosis not present

## 2016-06-21 DIAGNOSIS — Z79899 Other long term (current) drug therapy: Secondary | ICD-10-CM | POA: Insufficient documentation

## 2016-06-21 DIAGNOSIS — Z90710 Acquired absence of both cervix and uterus: Secondary | ICD-10-CM

## 2016-06-21 DIAGNOSIS — C5701 Malignant neoplasm of right fallopian tube: Secondary | ICD-10-CM

## 2016-06-21 DIAGNOSIS — I700 Atherosclerosis of aorta: Secondary | ICD-10-CM | POA: Insufficient documentation

## 2016-06-21 DIAGNOSIS — R490 Dysphonia: Secondary | ICD-10-CM | POA: Insufficient documentation

## 2016-06-21 DIAGNOSIS — Z7982 Long term (current) use of aspirin: Secondary | ICD-10-CM | POA: Insufficient documentation

## 2016-06-21 DIAGNOSIS — I341 Nonrheumatic mitral (valve) prolapse: Secondary | ICD-10-CM

## 2016-06-21 DIAGNOSIS — Z87440 Personal history of urinary (tract) infections: Secondary | ICD-10-CM

## 2016-06-21 DIAGNOSIS — Z923 Personal history of irradiation: Secondary | ICD-10-CM

## 2016-06-21 DIAGNOSIS — K449 Diaphragmatic hernia without obstruction or gangrene: Secondary | ICD-10-CM | POA: Diagnosis not present

## 2016-06-21 DIAGNOSIS — Z86718 Personal history of other venous thrombosis and embolism: Secondary | ICD-10-CM | POA: Insufficient documentation

## 2016-06-21 DIAGNOSIS — Z9221 Personal history of antineoplastic chemotherapy: Secondary | ICD-10-CM

## 2016-06-21 DIAGNOSIS — R600 Localized edema: Secondary | ICD-10-CM | POA: Diagnosis not present

## 2016-06-21 DIAGNOSIS — N3281 Overactive bladder: Secondary | ICD-10-CM | POA: Insufficient documentation

## 2016-06-21 DIAGNOSIS — Z5112 Encounter for antineoplastic immunotherapy: Secondary | ICD-10-CM | POA: Diagnosis present

## 2016-06-21 LAB — CBC WITH DIFFERENTIAL/PLATELET
BASOS PCT: 1 %
Basophils Absolute: 0.1 10*3/uL (ref 0–0.1)
EOS ABS: 0.1 10*3/uL (ref 0–0.7)
Eosinophils Relative: 2 %
HEMATOCRIT: 34.7 % — AB (ref 35.0–47.0)
HEMOGLOBIN: 11.6 g/dL — AB (ref 12.0–16.0)
LYMPHS ABS: 1.2 10*3/uL (ref 1.0–3.6)
Lymphocytes Relative: 29 %
MCH: 30.2 pg (ref 26.0–34.0)
MCHC: 33.5 g/dL (ref 32.0–36.0)
MCV: 90.3 fL (ref 80.0–100.0)
Monocytes Absolute: 0.7 10*3/uL (ref 0.2–0.9)
Monocytes Relative: 18 %
NEUTROS ABS: 2.1 10*3/uL (ref 1.4–6.5)
NEUTROS PCT: 50 %
Platelets: 431 10*3/uL (ref 150–440)
RBC: 3.84 MIL/uL (ref 3.80–5.20)
RDW: 15.5 % — ABNORMAL HIGH (ref 11.5–14.5)
WBC: 4.2 10*3/uL (ref 3.6–11.0)

## 2016-06-21 LAB — COMPREHENSIVE METABOLIC PANEL
ALBUMIN: 3.6 g/dL (ref 3.5–5.0)
ALK PHOS: 48 U/L (ref 38–126)
ALT: 12 U/L — AB (ref 14–54)
AST: 28 U/L (ref 15–41)
Anion gap: 6 (ref 5–15)
BILIRUBIN TOTAL: 0.6 mg/dL (ref 0.3–1.2)
BUN: 21 mg/dL — ABNORMAL HIGH (ref 6–20)
CALCIUM: 8.9 mg/dL (ref 8.9–10.3)
CO2: 27 mmol/L (ref 22–32)
CREATININE: 0.94 mg/dL (ref 0.44–1.00)
Chloride: 104 mmol/L (ref 101–111)
GFR calc Af Amer: >60 mL/min (ref >60)
GFR calc non Af Amer: 53 mL/min — ABNORMAL LOW (ref >60)
GLUCOSE: 103 mg/dL — AB (ref 65–99)
Potassium: 3.9 mmol/L (ref 3.5–5.1)
SODIUM: 137 mmol/L (ref 135–145)
TOTAL PROTEIN: 7 g/dL (ref 6.5–8.1)

## 2016-06-21 LAB — URINALYSIS, COMPLETE (UACMP) WITH MICROSCOPIC
Bilirubin Urine: NEGATIVE
Glucose, UA: NEGATIVE mg/dL
Hgb urine dipstick: NEGATIVE
KETONES UR: NEGATIVE mg/dL
Nitrite: NEGATIVE
PROTEIN: NEGATIVE mg/dL
Specific Gravity, Urine: 1.009 (ref 1.005–1.030)
pH: 7 (ref 5.0–8.0)

## 2016-06-21 MED ORDER — SODIUM CHLORIDE 0.9% FLUSH
10.0000 mL | INTRAVENOUS | Status: DC | PRN
  Administered 2016-06-21: 10 mL via INTRAVENOUS
  Filled 2016-06-21: qty 10

## 2016-06-21 MED ORDER — SODIUM CHLORIDE 0.9 % IV SOLN
Freq: Once | INTRAVENOUS | Status: AC
  Administered 2016-06-21: 12:00:00 via INTRAVENOUS
  Filled 2016-06-21: qty 1000

## 2016-06-21 MED ORDER — SODIUM CHLORIDE 0.9 % IV SOLN
10.0000 mg/kg | Freq: Once | INTRAVENOUS | Status: AC
  Administered 2016-06-21: 625 mg via INTRAVENOUS
  Filled 2016-06-21: qty 16

## 2016-06-21 MED ORDER — HEPARIN SOD (PORK) LOCK FLUSH 100 UNIT/ML IV SOLN
INTRAVENOUS | Status: AC
  Filled 2016-06-21: qty 5

## 2016-06-21 MED ORDER — NITROFURANTOIN MONOHYD MACRO 100 MG PO CAPS: 100.0000 mg | ORAL_CAPSULE | Freq: Every day | ORAL | 3 refills | Status: DC

## 2016-06-21 MED ORDER — HEPARIN SOD (PORK) LOCK FLUSH 100 UNIT/ML IV SOLN
500.0000 [IU] | Freq: Once | INTRAVENOUS | Status: AC
  Administered 2016-06-21: 500 [IU] via INTRAVENOUS

## 2016-06-21 NOTE — Progress Notes (Signed)
[place] OFFICE PROGRESS NOTE  Patient Care Team: Ezequiel Kayser, [name]  No matching staging information was found for the patient.   Oncology History   . 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  Adequate TRS, no macroscopic residual. IP/IV chemotherapy with DDP and paclitaxel on GOG protocol, chemo and Bev consolidation completed in 03/2011 2. 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 November 14, 2011- Patient was started on carboplatin and gemcitabine 4. Finished 6 cycles of chemotherapy in April of 2014 with carboplatin and gemcitabine. Tolerance was fairly good except for neutropenia and thrombocytopenia. 5.recurrent disease by CT scan February of 2015  # .radiation therapy to pelvis  May of 2015  7.upper extremity  . and deep vein thrombosis associated with port.(June of 2015) patient started on   [place] had port removed and had   thrombectomy September 06, 2013. # CT DEC 2016- progressing disease in the right inguinal area # FEB-TAXOL-AVASTIN;  # FEB 2017- AVASTIN q 3W; July 2017- CT- Improved  Right Inguinal LN; STOP AVASTIN sec to potential concerns of AEs  # AUG 2017 3rd- START ZEJULA- declines sec to intol  # OCT 7th CT- STABLE RIGHT INGUINAL LN- RE-START AVASTIN q 3W       Malignant neoplasm of right fallopian tube ([place])   10/05/2009 Initial Diagnosis    Carcinoma of fallopian tube        INTERVAL HISTORY:  AMBUR PROVINCE 83 y.o.  female pleasant patient above history of Fallopian tube cancer metastatic currently  Avastin is here for follow-up.  Denies any headaches. Denies any epistaxis.  Denies any pain.  She continues to chronic swelling in the left leg. Otherwise no weight loss or abdominal bloating. No new falls. Patient is excited about her grandson's wedding in another month. Patient had episode of increasing frequency of  urination and dysuria. She currently takes Macrobid once a day. Denies any fevers or chills or back pain.  REVIEW OF SYSTEMS:  A complete 10 point review of system is done which is negative except mentioned above/history of present illness. Complains of hoarseness of voice.  PAST MEDICAL HISTORY :  Past Medical History:  Diagnosis Date  . Bladder infection, chronic 10/19/2011  . Cancer (HCC)    Ovarian   . Carcinoma of fallopian tube ([place]) 10/05/2009   Overview:  Overview:  Overview:  Drs. Claiborne Rigg and Delorise Shiner Choksi Overview:  Drs. Claiborne Rigg and [employer]   . GERD (gastroesophageal reflux disease)   . MI (mitral incompetence) 10/27/2014   Overview:  MODERATE   . Mitral valve prolapse   . OAB (overactive bladder)   . Tachycardia     PAST SURGICAL HISTORY :   Past Surgical History:  Procedure Laterality Date  . ABDOMINAL HYSTERECTOMY    . CHOLECYSTECTOMY    . LAPAROSCOPIC BILATERAL SALPINGO OOPHERECTOMY  2011  . ovarian cancer surgery  2011    FAMILY HISTORY :   Family History  Problem Relation Age of Onset  . Ovarian cancer Other   . Breast cancer Neg Hx   . Colon cancer Neg Hx   . Diabetes Neg Hx   . Heart disease Neg Hx   . Kidney disease Neg Hx   . Bladder Cancer Neg Hx     SOCIAL HISTORY:   Social History  Substance Use Topics  . Smoking status: Never Smoker  . Smokeless  tobacco: Never Used  . Alcohol use No    ALLERGIES:  is allergic to benadryl [diphenhydramine]; tamsulosin; alendronate sodium; duloxetine hcl; risedronate sodium; lovastatin; and sulfa antibiotics.  MEDICATIONS:  Current Outpatient Prescriptions  Medication Sig Dispense Refill  . acetaminophen (TYLENOL) 500 MG tablet Take 1 tablet (500 mg total) by mouth every 6 (six) hours as needed. 30 tablet 0  . aspirin EC 81 MG tablet Take 81 mg by mouth.    . cetirizine (ZYRTEC) 10 MG tablet Take 10 mg by mouth daily.     . Cholecalciferol (VITAMIN D3) 2000 UNITS capsule Take 2,000 Units by  mouth daily.     . fluticasone (FLONASE) 50 MCG/ACT nasal spray Place 1 spray into both nostrils daily.     Marland Kitchen gabapentin (NEURONTIN) 600 MG tablet TAKE (1) TABLET BY MOUTH THREE TIMES A DAY 180 tablet 0  . metoprolol succinate (TOPROL-XL) 50 MG 24 hr tablet Take 50 mg by mouth daily.    . nitrofurantoin, macrocrystal-monohydrate, (MACROBID) 100 MG capsule Take 1 capsule (100 mg total) by mouth daily. 30 capsule 3  . ondansetron (ZOFRAN) 4 MG tablet TAKE (1) TABLET BY MOUTH EVERY 8 HOURS AS NEEDED FOR NAUSEA/VOMITING 30 tablet 3  . pantoprazole (PROTONIX) 40 MG tablet Take 40 mg by mouth.    . triamcinolone ointment (KENALOG) 0.1 % Apply 1 application topically 2 (two) times daily. (Patient taking differently: Apply 1 application topically 2 (two) times daily as needed. ) 30 g 1  . Ascorbic Acid (VITAMIN C) 100 MG tablet Take 100 mg by mouth daily.    Marland Kitchen docusate sodium (COLACE) 100 MG capsule Take 100 mg by mouth.    . Multiple Vitamins-Minerals (CENTRUM SILVER PO) Take 1 tablet by mouth daily.      No current facility-administered medications for this visit.    Facility-Administered Medications Ordered in Other Visits  Medication Dose Route Frequency Provider Last Rate Last Dose  . diphenhydrAMINE (BENADRYL) injection 50 mg  50 mg Intravenous Once Choksi, Janak, MD      . sodium chloride 0.9 % injection 10 mL  10 mL Intravenous PRN Forest Gleason, MD   10 mL at 01/12/15 1116  . sodium chloride 0.9 % injection 10 mL  10 mL Intravenous PRN Forest Gleason, MD   10 mL at 02/16/15 1025    PHYSICAL EXAMINATION: ECOG PERFORMANCE STATUS: 2 - Symptomatic, <50% confined to bed  BP (!) 156/86 (BP Location: Left Arm, Patient Position: Sitting)   Pulse 69   Temp 97.6 F (36.4 C) (Tympanic)   Resp 18   Ht 5' (1.524 m)   Wt 138 lb 8 oz (62.8 kg)   BMI 27.05 kg/m   Filed Weights   06/21/16 1057  Weight: 138 lb 8 oz (62.8 kg)    GENERAL: Well-nourished well-developed; Alert, no distress and  comfortable.  She is walking herself. Accompanied by daughter. EYES: no pallor or icterus OROPHARYNX: no thrush or ulceration; good dentition  NECK: supple, no masses felt LYMPH:  no palpable lymphadenopathy in the cervical, axillary; Right ingiunal LN ~2-3cm in size.  LUNGS: clear to auscultation and  No wheeze or crackles HEART/CVS: regular rate & rhythm and no murmurs; chronic right lower extremity swelling ABDOMEN:abdomen soft, non-tender and normal bowel sounds Musculoskeletal:no cyanosis of digits and no clubbing  PSYCH: alert & oriented x 3 with fluent speech NEURO: no focal motor/sensory deficits SKIN:  no rashes or significant lesions.  LABORATORY DATA:  I have reviewed the data as listed  Component Value Date/Time   NA 137 06/21/2016 1034   NA 138 06/02/2014 0910   K 3.9 06/21/2016 1034   K 3.7 06/02/2014 0910   CL 104 06/21/2016 1034   CL 107 06/02/2014 0910   CO2 27 06/21/2016 1034   CO2 26 06/02/2014 0910   GLUCOSE 103 (H) 06/21/2016 1034   GLUCOSE 106 (H) 06/02/2014 0910   BUN 21 (H) 06/21/2016 1034   BUN 17 06/02/2014 0910   CREATININE 0.94 06/21/2016 1034   CREATININE 1.00 06/02/2014 0910   CALCIUM 8.9 06/21/2016 1034   CALCIUM 8.6 (L) 06/02/2014 0910   PROT 7.0 06/21/2016 1034   PROT 6.3 (L) 06/02/2014 0910   ALBUMIN 3.6 06/21/2016 1034   ALBUMIN 3.4 (L) 06/02/2014 0910   AST 28 06/21/2016 1034   AST 23 06/02/2014 0910   ALT 12 (L) 06/21/2016 1034   ALT 10 (L) 06/02/2014 0910   ALKPHOS 48 06/21/2016 1034   ALKPHOS 46 06/02/2014 0910   BILITOT 0.6 06/21/2016 1034   BILITOT 0.5 06/02/2014 0910   GFRNONAA 53 (L) 06/21/2016 1034   GFRNONAA 52 (L) 06/02/2014 0910   GFRAA >60 06/21/2016 1034   GFRAA >60 06/02/2014 0910    No results found for: SPEP, UPEP  Lab Results  Component Value Date   WBC 4.2 06/21/2016   NEUTROABS 2.1 06/21/2016   HGB 11.6 (L) 06/21/2016   HCT 34.7 (L) 06/21/2016   MCV 90.3 06/21/2016   PLT 431 06/21/2016       Chemistry      Component Value Date/Time   NA 137 06/21/2016 1034   NA 138 06/02/2014 0910   K 3.9 06/21/2016 1034   K 3.7 06/02/2014 0910   CL 104 06/21/2016 1034   CL 107 06/02/2014 0910   CO2 27 06/21/2016 1034   CO2 26 06/02/2014 0910   BUN 21 (H) 06/21/2016 1034   BUN 17 06/02/2014 0910   CREATININE 0.94 06/21/2016 1034   CREATININE 1.00 06/02/2014 0910      Component Value Date/Time   CALCIUM 8.9 06/21/2016 1034   CALCIUM 8.6 (L) 06/02/2014 0910   ALKPHOS 48 06/21/2016 1034   ALKPHOS 46 06/02/2014 0910   AST 28 06/21/2016 1034   AST 23 06/02/2014 0910   ALT 12 (L) 06/21/2016 1034   ALT 10 (L) 06/02/2014 0910   BILITOT 0.6 06/21/2016 1034   BILITOT 0.5 06/02/2014 0910     IMPRESSION: 1. No acute findings identified within the abdomen or pelvis. 2. Stable appearance of right external iliac and right inguinal metastatic adenopathy. 3. Large hiatal hernia. 4. Aortic atherosclerosis. 5. Stable small cystic lesion arising from tail of pancreas.   Electronically Signed   By: Kerby Moors M.D.   On: 02/15/2016 09:58  RADIOGRAPHIC STUDIES: I have personally reviewed the radiological images as listed and agreed with the findings in the report. No results found.   ASSESSMENT & PLAN:  Malignant neoplasm of right fallopian tube [place]) Metastatic fallopian tube cancer- Currently on Avastin since February 2017. JAN 31st  2018-- CT scan- STABLE Lymphadenopathy in right groin. No clinical progression noted. Recent Ca-125-normal at 18.  # proceed with AVASTIN today; Labs today reviewed;  acceptable for treatment today.  UA- no proteinuria.  #  Right R swelling-chronic ? Sec to LN-  STABLE.    # Elevated BP-today 150s [ recommend a log; and to let us know if continues to be elevated.  Continue avastin.   # 79mm pancreatic lesion- monitor for now.  Await CT scan again in late June 2018.   # Chronic UTIs- on macrobid;new prescription given.   # follow-up with me on June  29th/labs/Avastin- CT scan few days prior.    Orders Placed This Encounter  Procedures  . CA 125    Standing Status:   Future    Number of Occurrences:   1    Standing Expiration Date:   06/21/2017      [name], MD 06/23/2016 5:22 PM

## 2016-06-21 NOTE — Assessment & Plan Note (Addendum)
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 JAN 31st  2018-- CT scan- STABLE Lymphadenopathy in right groin. No clinical progression noted. Recent Ca-125-normal at 18.  # proceed with AVASTIN today; Labs today reviewed;  acceptable for treatment today.  UA- no proteinuria.  #  Right R swelling-chronic ? Sec to LN-  STABLE.    # Elevated BP-today 150s [ recommend a log; and to let us know if continues to be elevated.  Continue avastin.   # 25mm pancreatic lesion- monitor for now. Await CT scan again in late June 2018.   # Chronic UTIs- on macrobid;new prescription given.   # follow-up with me on June 29th/labs/Avastin- CT scan few days prior.

## 2016-06-22 LAB — CA 125: CA 125: 24.7 U/mL (ref 0.0–38.1)

## 2016-07-09 ENCOUNTER — Ambulatory Visit
Admission: EM | Admit: 2016-07-09 | Discharge: 2016-07-09 | Disposition: A | Discharge status: Hospice | Payer: Medicare HMO | Attending: Family Medicine | Admitting: Family Medicine

## 2016-07-09 ENCOUNTER — Emergency Department
Admission: EM | Admit: 2016-07-09 | Discharge: 2016-07-09 | Disposition: A | Discharge status: Home / Self Care | Payer: Medicare HMO | Attending: Emergency Medicine | Admitting: Emergency Medicine

## 2016-07-09 ENCOUNTER — Emergency Department: Payer: Medicare HMO

## 2016-07-09 ENCOUNTER — Encounter: Payer: Self-pay | Admitting: Emergency Medicine

## 2016-07-09 DIAGNOSIS — Y92481 Parking lot as the place of occurrence of the external cause: Secondary | ICD-10-CM | POA: Diagnosis not present

## 2016-07-09 DIAGNOSIS — H538 Other visual disturbances: Secondary | ICD-10-CM | POA: Diagnosis not present

## 2016-07-09 DIAGNOSIS — Z8543 Personal history of malignant neoplasm of ovary: Secondary | ICD-10-CM | POA: Diagnosis not present

## 2016-07-09 DIAGNOSIS — R079 Chest pain, unspecified: Secondary | ICD-10-CM | POA: Diagnosis not present

## 2016-07-09 DIAGNOSIS — R51 Headache: Secondary | ICD-10-CM | POA: Diagnosis not present

## 2016-07-09 DIAGNOSIS — S0003XA Contusion of scalp, initial encounter: Secondary | ICD-10-CM | POA: Diagnosis not present

## 2016-07-09 DIAGNOSIS — Y999 Unspecified external cause status: Secondary | ICD-10-CM | POA: Insufficient documentation

## 2016-07-09 DIAGNOSIS — Z7982 Long term (current) use of aspirin: Secondary | ICD-10-CM | POA: Insufficient documentation

## 2016-07-09 DIAGNOSIS — Y939 Activity, unspecified: Secondary | ICD-10-CM | POA: Insufficient documentation

## 2016-07-09 DIAGNOSIS — Z79899 Other long term (current) drug therapy: Secondary | ICD-10-CM | POA: Insufficient documentation

## 2016-07-09 DIAGNOSIS — S299XXA Unspecified injury of thorax, initial encounter: Secondary | ICD-10-CM | POA: Diagnosis not present

## 2016-07-09 DIAGNOSIS — S0990XA Unspecified injury of head, initial encounter: Secondary | ICD-10-CM

## 2016-07-09 DIAGNOSIS — W19XXXA Unspecified fall, initial encounter: Secondary | ICD-10-CM

## 2016-07-09 DIAGNOSIS — S0001XA Abrasion of scalp, initial encounter: Secondary | ICD-10-CM | POA: Insufficient documentation

## 2016-07-09 DIAGNOSIS — W1839XA Other fall on same level, initial encounter: Secondary | ICD-10-CM | POA: Insufficient documentation
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
## 2016-07-09 NOTE — ED Notes (Signed)
Visual Acuity Screen performed per this RN;  Right eye 20/30 Left eye 20/40
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
## 2016-07-16 ENCOUNTER — Ambulatory Visit
Admission: EM | Admit: 2016-07-16 | Discharge: 2016-07-16 | Disposition: A | Discharge status: Home / Self Care | Payer: Medicare HMO | Attending: Family Medicine | Admitting: Family Medicine

## 2016-07-16 ENCOUNTER — Ambulatory Visit (INDEPENDENT_AMBULATORY_CARE_PROVIDER_SITE_OTHER): Payer: Medicare HMO

## 2016-07-16 ENCOUNTER — Encounter: Payer: Self-pay | Admitting: Emergency Medicine

## 2016-07-16 DIAGNOSIS — M25551 Pain in right hip: Secondary | ICD-10-CM

## 2016-07-16 DIAGNOSIS — S76011A Strain of muscle, fascia and tendon of right hip, initial encounter: Secondary | ICD-10-CM | POA: Diagnosis not present

## 2016-07-16 DIAGNOSIS — W19XXXA Unspecified fall, initial encounter: Secondary | ICD-10-CM

## 2016-07-16 MED ORDER — TRAMADOL HCL 50 MG PO TABS: ORAL_TABLET | ORAL | 0 refills | Status: DC

## 2016-07-16 NOTE — ED Provider Notes (Signed)
[facility]    CSN: [id number] Arrival date & time: 07/16/16  1410     History   Chief Complaint Chief Complaint  Patient presents with  . Hip Pain    right    HPI Nicole Clayton is a 83 y.o. female.   83 yo female presents with a 1 week h/o right hip pain since falling last week. Has been able to ambulate, however it is painful when walking.     Hip Pain     Past Medical History:  Diagnosis Date  . Bladder infection, chronic 10/19/2011  . Cancer (HCC)    Ovarian   . 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 Claiborne Rigg and Delorise Shiner Choksi Overview:  Drs. Claiborne Rigg and [employer]   . GERD (gastroesophageal reflux disease)   . MI (mitral incompetence) 10/27/2014   Overview:  MODERATE   . Mitral valve prolapse   . OAB (overactive bladder)   . Tachycardia     Patient Active Problem List   Diagnosis Date Noted  . Anemia 09/11/2015  . UTI (urinary tract infection) due to Enterococcus 07/19/2015  . 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 Neuropathy 02/10/2015  . Cystocele, grade 2 12/15/2014  . Vaginal atrophy 12/15/2014  . Status post hysterectomy with oophorectomy 12/15/2014  . Celiac disease 11/07/2014  . MI (mitral incompetence) 10/27/2014  . Infection of urinary tract 08/12/2014  . CD (celiac disease) 07/12/2014  . Mixed hyperlipidemia 04/12/2014  . Paroxysmal supraventricular tachycardia ([place]) 04/12/2014  . Thrombocythemia ([place]) 09/26/2013  . Vitamin D deficiency 09/26/2013  . Frequent UTI 09/26/2013  . Chest pain 07/28/2013  . Edema 07/06/2013  . Other symptoms involving urinary system 07/07/2012  . Female genuine stress incontinence 02/10/2012  . Bladder infection, chronic 10/19/2011  . Urge incontinence 10/19/2011  . 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 ABDOMINAL HYSTERECTOMY    . CHOLECYSTECTOMY    . LAPAROSCOPIC  BILATERAL SALPINGO OOPHERECTOMY  2011  . ovarian cancer surgery  2011    OB History    Gravida Para Term Preterm AB Living   3 3 3     3    SAB TAB Ectopic Multiple Live Births           3       Home Medications    Prior to Admission medications   Medication Sig Start Date End Date Taking? Authorizing Provider  acetaminophen (TYLENOL) 500 MG tablet Take 1 tablet (500 mg total) by mouth every 6 (six) hours as needed. 10/16/14   Betancourt, Aura Fey, NP  Ascorbic Acid (VITAMIN C) 100 MG tablet Take 100 mg by mouth daily.    [provider]  aspirin EC 81 MG tablet Take 81 mg by mouth.    [provider]  cetirizine (ZYRTEC) 10 MG tablet Take 10 mg by mouth daily.     [provider]  Cholecalciferol (VITAMIN D3) 2000 UNITS capsule Take 2,000 Units by mouth daily.     [provider]  docusate sodium (COLACE) 100 MG capsule Take 100 mg by mouth.    [provider]  fluticasone (FLONASE) 50 MCG/ACT nasal spray Place 1 spray into both nostrils daily.  04/26/14   [provider]  gabapentin (NEURONTIN) 600 MG tablet TAKE (1) TABLET BY MOUTH THREE TIMES A DAY 03/04/16   [name], MD  metoprolol succinate (  TOPROL-XL) 50 MG 24 hr tablet Take 50 mg by mouth daily. 10/17/15 10/16/16  [provider]  Multiple Vitamins-Minerals (CENTRUM SILVER PO) Take 1 tablet by mouth daily.     [provider]  nitrofurantoin, macrocrystal-monohydrate, (MACROBID) 100 MG capsule Take 1 capsule (100 mg total) by mouth daily. 06/21/16   [name], MD  ondansetron (ZOFRAN) 4 MG tablet TAKE (1) TABLET BY MOUTH EVERY 8 HOURS AS NEEDED FOR NAUSEA/VOMITING 05/31/16   [name], MD  pantoprazole (PROTONIX) 40 MG tablet Take 40 mg by mouth. 10/18/11   [provider]  traMADol (ULTRAM) 50 MG tablet 1 tab po q 8 hours prn 07/16/16   Norval Gable, MD  triamcinolone ointment (KENALOG) 0.1 % Apply 1 application  topically 2 (two) times daily. Patient taking differently: Apply 1 application topically 2 (two) times daily as needed.  11/02/15   Defrancesco, Alanda Slim, MD    Family History Family History  Problem Relation Age of Onset  . Ovarian cancer Other   . Breast cancer Neg Hx   . Colon cancer Neg Hx   . Diabetes Neg Hx   . Heart disease Neg Hx   . Kidney disease Neg Hx   . Bladder Cancer Neg Hx     Social History Social History  Substance Use Topics  . Smoking status: Never Smoker  . Smokeless tobacco: Never Used  . Alcohol use No     Allergies   Benadryl [diphenhydramine]; Tamsulosin; Alendronate sodium; Duloxetine hcl; Risedronate sodium; Lovastatin; and Sulfa antibiotics   Review of Systems Review of Systems   Physical Exam Triage Vital Signs ED Triage Vitals  Enc Vitals Group     BP 07/16/16 1439 (!) 160/96     Pulse Rate 07/16/16 1439 98     Resp 07/16/16 1439 16     Temp 07/16/16 1439 98 F (36.7 C)     Temp Source 07/16/16 1439 Oral     SpO2 07/16/16 1439 97 %     Weight 07/16/16 1438 135 lb (61.2 kg)     Height 07/16/16 1438 5' (1.524 m)     Head Circumference --      Peak Flow --      Pain Score 07/16/16 1438 8     Pain Loc --      Pain Edu? --      Excl. in [place]? --    No data found.   Updated Vital Signs BP (!) 160/96 (BP Location: Right Arm)   Pulse 98   Temp 98 F (36.7 C) (Oral)   Resp 16   Ht 5' (1.524 m)   Wt 135 lb (61.2 kg)   SpO2 97%   BMI 26.37 kg/m   Visual Acuity Right Eye Distance:   Left Eye Distance:   Bilateral Distance:    Right Eye Near:   Left Eye Near:    Bilateral Near:     Physical Exam  Constitutional: She appears well-developed and well-nourished. No distress.  Musculoskeletal:       Right hip: She exhibits tenderness. She exhibits normal range of motion, normal strength, no bony tenderness, no swelling, no crepitus, no deformity and no laceration.  Skin: She is not diaphoretic.  Nursing note and vitals  reviewed.    UC Treatments / Results  Labs (all labs ordered are listed, but only abnormal results are displayed) Labs Reviewed - No data to display  EKG  EKG Interpretation None  Radiology Dg Hip Unilat With Pelvis 2-3 Views Right  Result Date: 07/16/2016 CLINICAL DATA:  Fall 1 week ago.  Right hip pain. EXAM: DG HIP (WITH OR WITHOUT PELVIS) 2-3V RIGHT COMPARISON:  02/15/2016 CT FINDINGS: AP view the pelvis and AP/frog leg views of the right hip. Femoral heads are located. Sacroiliac joints are symmetric. Mild degenerative changes involve the right hip. Minimal joint space narrowing with osteophyte formation about the femoral head laterally. No acute fracture. IMPRESSION: No acute osseous abnormality. Electronically Signed   By: Abigail Miyamoto M.D.   On: 07/16/2016 15:42    Procedures Procedures (including critical care time)  Medications Ordered in UC Medications - No data to display   Initial Impression / Assessment and Plan / UC Course  I have reviewed the triage vital signs and the nursing notes.  Pertinent labs & imaging results that were available during my care of the patient were reviewed by me and considered in my medical decision making (see chart for details).       Final Clinical Impressions(s) / UC Diagnoses   Final diagnoses:  Fall  Right hip pain  Hip strain, right, initial encounter    New Prescriptions Discharge Medication List as of 07/16/2016  3:49 PM    START taking these medications   Details  traMADol (ULTRAM) 50 MG tablet 1 tab po q 8 hours prn, Normal       1. x-ray result (negative for acute fracture) and diagnosis reviewed with patient and daughter 2. rx as per orders above; reviewed possible side effects, interactions, risks and benefits  3. Recommend supportive treatment with rest, otc analgesics prn, ice 4. Follow-up prn if symptoms worsen or don't improve   Norval Gable, MD 07/16/16 1829

## 2016-07-16 NOTE — Discharge Instructions (Signed)
Rest, ice, ibuprofen as needed

## 2016-07-16 NOTE — ED Triage Notes (Signed)
Patient c/o right hip pain that has gotten worse for a week.  Patient states that she fell a week ago and was seen at [facility] ED.

## 2016-07-23 DIAGNOSIS — R600 Localized edema: Secondary | ICD-10-CM | POA: Diagnosis not present

## 2016-07-23 DIAGNOSIS — I341 Nonrheumatic mitral (valve) prolapse: Secondary | ICD-10-CM | POA: Diagnosis not present

## 2016-07-23 DIAGNOSIS — I5021 Acute systolic (congestive) heart failure: Secondary | ICD-10-CM | POA: Diagnosis not present

## 2016-07-23 DIAGNOSIS — I10 Essential (primary) hypertension: Secondary | ICD-10-CM | POA: Diagnosis not present

## 2016-07-25 ENCOUNTER — Telehealth: Payer: Self-pay | Admitting: *Deleted

## 2016-07-25 ENCOUNTER — Ambulatory Visit (INDEPENDENT_AMBULATORY_CARE_PROVIDER_SITE_OTHER): Payer: Medicare HMO | Admitting: Obstetrics and Gynecology

## 2016-07-25 ENCOUNTER — Other Ambulatory Visit: Payer: Self-pay | Admitting: *Deleted

## 2016-07-25 ENCOUNTER — Encounter: Payer: Self-pay | Admitting: Obstetrics and Gynecology

## 2016-07-25 VITALS — BP 132/73 | HR 75 | Ht 60.0 in | Wt 131.2 lb

## 2016-07-25 DIAGNOSIS — Z90721 Acquired absence of ovaries, unilateral: Secondary | ICD-10-CM

## 2016-07-25 DIAGNOSIS — Z4689 Encounter for fitting and adjustment of other specified devices: Secondary | ICD-10-CM | POA: Diagnosis not present

## 2016-07-25 DIAGNOSIS — Z90710 Acquired absence of both cervix and uterus: Secondary | ICD-10-CM | POA: Diagnosis not present

## 2016-07-25 DIAGNOSIS — C5700 Malignant neoplasm of unspecified fallopian tube: Secondary | ICD-10-CM

## 2016-07-25 DIAGNOSIS — N8111 Cystocele, midline: Secondary | ICD-10-CM

## 2016-07-25 DIAGNOSIS — R339 Retention of urine, unspecified: Secondary | ICD-10-CM | POA: Diagnosis not present

## 2016-07-25 DIAGNOSIS — C569 Malignant neoplasm of unspecified ovary: Secondary | ICD-10-CM

## 2016-07-25 DIAGNOSIS — N952 Postmenopausal atrophic vaginitis: Secondary | ICD-10-CM | POA: Diagnosis not present

## 2016-07-25 MED ORDER — GABAPENTIN 600 MG PO TABS: ORAL_TABLET | ORAL | 0 refills | Status: DC

## 2016-07-25 NOTE — Telephone Encounter (Signed)
Pt in [place] for her husband's care. Stopped RN in hallway to request RF on gabapentin.  RN spoke with Dr. Mike Gip - on call- md agreed to RF gabapentin.
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
## 2016-07-25 NOTE — Patient Instructions (Signed)
1.  Return in 3 months for pessary maintenance 

## 2016-07-30 ENCOUNTER — Ambulatory Visit: Payer: Medicare HMO

## 2016-07-30 ENCOUNTER — Ambulatory Visit
Admission: RE | Admit: 2016-07-30 | Discharge: 2016-07-30 | Disposition: A | Discharge status: Home / Self Care | Payer: Medicare HMO | Source: Ambulatory Visit | Attending: Internal Medicine | Admitting: Internal Medicine

## 2016-07-30 DIAGNOSIS — C5701 Malignant neoplasm of right fallopian tube: Secondary | ICD-10-CM

## 2016-07-30 DIAGNOSIS — I700 Atherosclerosis of aorta: Secondary | ICD-10-CM | POA: Insufficient documentation

## 2016-07-30 DIAGNOSIS — C774 Secondary and unspecified malignant neoplasm of inguinal and lower limb lymph nodes: Secondary | ICD-10-CM | POA: Insufficient documentation

## 2016-07-30 DIAGNOSIS — S3991XA Unspecified injury of abdomen, initial encounter: Secondary | ICD-10-CM | POA: Diagnosis not present

## 2016-07-30 DIAGNOSIS — S32000A Wedge compression fracture of unspecified lumbar vertebra, initial encounter for closed fracture: Secondary | ICD-10-CM | POA: Insufficient documentation

## 2016-07-30 DIAGNOSIS — K449 Diaphragmatic hernia without obstruction or gangrene: Secondary | ICD-10-CM | POA: Insufficient documentation

## 2016-07-30 DIAGNOSIS — S3993XA Unspecified injury of pelvis, initial encounter: Secondary | ICD-10-CM | POA: Diagnosis not present

## 2016-07-30 DIAGNOSIS — C5700 Malignant neoplasm of unspecified fallopian tube: Secondary | ICD-10-CM

## 2016-07-30 MED ORDER — IOPAMIDOL (ISOVUE-300) INJECTION 61%
100.0000 mL | Freq: Once | INTRAVENOUS | Status: AC | PRN
  Administered 2016-07-30: 100 mL via INTRAVENOUS

## 2016-08-02 ENCOUNTER — Inpatient Hospital Stay: Payer: Medicare HMO | Admitting: *Deleted

## 2016-08-02 ENCOUNTER — Inpatient Hospital Stay: Payer: Medicare HMO

## 2016-08-02 ENCOUNTER — Inpatient Hospital Stay: Payer: Medicare HMO | Attending: Internal Medicine | Admitting: Internal Medicine

## 2016-08-02 VITALS — BP 109/71 | HR 66 | Temp 97.4°F | Resp 16 | Wt 130.2 lb

## 2016-08-02 DIAGNOSIS — Z79899 Other long term (current) drug therapy: Secondary | ICD-10-CM | POA: Diagnosis not present

## 2016-08-02 DIAGNOSIS — N390 Urinary tract infection, site not specified: Secondary | ICD-10-CM | POA: Insufficient documentation

## 2016-08-02 DIAGNOSIS — Z9221 Personal history of antineoplastic chemotherapy: Secondary | ICD-10-CM | POA: Insufficient documentation

## 2016-08-02 DIAGNOSIS — Z86718 Personal history of other venous thrombosis and embolism: Secondary | ICD-10-CM | POA: Diagnosis not present

## 2016-08-02 DIAGNOSIS — C5701 Malignant neoplasm of right fallopian tube: Secondary | ICD-10-CM

## 2016-08-02 DIAGNOSIS — Z7982 Long term (current) use of aspirin: Secondary | ICD-10-CM | POA: Insufficient documentation

## 2016-08-02 DIAGNOSIS — I700 Atherosclerosis of aorta: Secondary | ICD-10-CM | POA: Insufficient documentation

## 2016-08-02 DIAGNOSIS — K219 Gastro-esophageal reflux disease without esophagitis: Secondary | ICD-10-CM | POA: Insufficient documentation

## 2016-08-02 DIAGNOSIS — N3281 Overactive bladder: Secondary | ICD-10-CM | POA: Diagnosis not present

## 2016-08-02 DIAGNOSIS — Z5112 Encounter for antineoplastic immunotherapy: Secondary | ICD-10-CM | POA: Insufficient documentation

## 2016-08-02 DIAGNOSIS — C774 Secondary and unspecified malignant neoplasm of inguinal and lower limb lymph nodes: Secondary | ICD-10-CM | POA: Insufficient documentation

## 2016-08-02 DIAGNOSIS — Z923 Personal history of irradiation: Secondary | ICD-10-CM | POA: Insufficient documentation

## 2016-08-02 DIAGNOSIS — I252 Old myocardial infarction: Secondary | ICD-10-CM | POA: Insufficient documentation

## 2016-08-02 DIAGNOSIS — Z90722 Acquired absence of ovaries, bilateral: Secondary | ICD-10-CM | POA: Insufficient documentation

## 2016-08-02 DIAGNOSIS — R590 Localized enlarged lymph nodes: Secondary | ICD-10-CM | POA: Diagnosis not present

## 2016-08-02 DIAGNOSIS — Z9181 History of falling: Secondary | ICD-10-CM | POA: Diagnosis not present

## 2016-08-02 DIAGNOSIS — Z90710 Acquired absence of both cervix and uterus: Secondary | ICD-10-CM | POA: Diagnosis not present

## 2016-08-02 DIAGNOSIS — I341 Nonrheumatic mitral (valve) prolapse: Secondary | ICD-10-CM | POA: Diagnosis not present

## 2016-08-02 DIAGNOSIS — K449 Diaphragmatic hernia without obstruction or gangrene: Secondary | ICD-10-CM | POA: Diagnosis not present

## 2016-08-02 LAB — CBC WITH DIFFERENTIAL/PLATELET
BASOS ABS: 0 10*3/uL (ref 0–0.1)
Basophils Relative: 1 %
EOS ABS: 0 10*3/uL (ref 0–0.7)
Eosinophils Relative: 1 %
HCT: 33.9 % — ABNORMAL LOW (ref 35.0–47.0)
Hemoglobin: 11.5 g/dL — ABNORMAL LOW (ref 12.0–16.0)
Lymphocytes Relative: 30 %
Lymphs Abs: 1.2 10*3/uL (ref 1.0–3.6)
MCH: 30.5 pg (ref 26.0–34.0)
MCHC: 33.9 g/dL (ref 32.0–36.0)
MCV: 90.1 fL (ref 80.0–100.0)
Monocytes Absolute: 0.6 10*3/uL (ref 0.2–0.9)
Monocytes Relative: 15 %
Neutro Abs: 2.2 10*3/uL (ref 1.4–6.5)
Neutrophils Relative %: 53 %
PLATELETS: 398 10*3/uL (ref 150–440)
RBC: 3.76 MIL/uL — AB (ref 3.80–5.20)
RDW: 15.3 % — ABNORMAL HIGH (ref 11.5–14.5)
WBC: 4.1 10*3/uL (ref 3.6–11.0)

## 2016-08-02 LAB — URINALYSIS, COMPLETE (UACMP) WITH MICROSCOPIC
BILIRUBIN URINE: NEGATIVE
Glucose, UA: NEGATIVE mg/dL
HGB URINE DIPSTICK: NEGATIVE
Ketones, ur: NEGATIVE mg/dL
NITRITE: NEGATIVE
PH: 6 (ref 5.0–8.0)
Protein, ur: NEGATIVE mg/dL
SPECIFIC GRAVITY, URINE: 1.011 (ref 1.005–1.030)

## 2016-08-02 LAB — COMPREHENSIVE METABOLIC PANEL
ALT: 12 U/L — AB (ref 14–54)
AST: 24 U/L (ref 15–41)
Albumin: 3.5 g/dL (ref 3.5–5.0)
Alkaline Phosphatase: 67 U/L (ref 38–126)
Anion gap: 5 (ref 5–15)
BUN: 19 mg/dL (ref 6–20)
CHLORIDE: 105 mmol/L (ref 101–111)
CO2: 27 mmol/L (ref 22–32)
CREATININE: 0.81 mg/dL (ref 0.44–1.00)
Calcium: 8.8 mg/dL — ABNORMAL LOW (ref 8.9–10.3)
GFR calc non Af Amer: >60 mL/min (ref >60)
Glucose, Bld: 99 mg/dL (ref 65–99)
Potassium: 4.2 mmol/L (ref 3.5–5.1)
SODIUM: 137 mmol/L (ref 135–145)
Total Bilirubin: 0.5 mg/dL (ref 0.3–1.2)
Total Protein: 6.7 g/dL (ref 6.5–8.1)

## 2016-08-02 MED ORDER — HEPARIN SOD (PORK) LOCK FLUSH 100 UNIT/ML IV SOLN
500.0000 [IU] | Freq: Once | INTRAVENOUS | Status: AC | PRN
  Administered 2016-08-02: 500 [IU]

## 2016-08-02 MED ORDER — BEVACIZUMAB CHEMO INJECTION 400 MG/16ML
10.0000 mg/kg | Freq: Once | INTRAVENOUS | Status: AC
  Administered 2016-08-02: 625 mg via INTRAVENOUS
  Filled 2016-08-02: qty 16

## 2016-08-02 MED ORDER — SODIUM CHLORIDE 0.9 % IJ SOLN
10.0000 mL | INTRAMUSCULAR | Status: DC | PRN
  Administered 2016-08-02 (×2): 10 mL
  Filled 2016-08-02: qty 10

## 2016-08-02 MED ORDER — SODIUM CHLORIDE 0.9 % IV SOLN
Freq: Once | INTRAVENOUS | Status: AC
  Administered 2016-08-02: 13:00:00 via INTRAVENOUS
  Filled 2016-08-02: qty 1000

## 2016-08-02 NOTE — Progress Notes (Signed)
[facility] OFFICE PROGRESS NOTE  Patient Care Team: Ezequiel Kayser, [name]  No matching staging information was found for the patient.   Oncology History   . 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  Adequate TRS, no macroscopic residual. IP/IV chemotherapy with DDP and paclitaxel on GOG protocol, chemo and Bev consolidation completed in 03/2011 2. 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 November 14, 2011- Patient was started on carboplatin and gemcitabine 4. Finished 6 cycles of chemotherapy in April of 2014 with carboplatin and gemcitabine. Tolerance was fairly good except for neutropenia and thrombocytopenia. 5.recurrent disease by CT scan February of 2015  # .radiation therapy to pelvis  May of 2015  7.upper extremity  . and deep vein thrombosis associated with port.(June of 2015) patient started on   [place] had port removed and had   thrombectomy September 06, 2013. # CT DEC 2016- progressing disease in the right inguinal area # FEB-TAXOL-AVASTIN;  # FEB 2017- AVASTIN q 3W; July 2017- CT- Improved  Right Inguinal LN; STOP AVASTIN sec to potential concerns of AEs  # AUG 2017 3rd- START ZEJULA- declines sec to intol  # OCT 7th CT- STABLE RIGHT INGUINAL LN- RE-START AVASTIN q 3W       Malignant neoplasm of right fallopian tube ([place])   10/05/2009 Initial Diagnosis    Carcinoma of fallopian tube        INTERVAL HISTORY:  Nicole Clayton 83 y.o.  female pleasant patient above history of Fallopian tube cancer metastatic currently  Avastin is here for follow-up; also here to review the results of the restaging CAT scan  Patient unfortunately had traumatic fall hitting her head; also falling on her back.  evaluation emergency room CT of the chest and cervical spine negative for any significant trauma.   Denies any abdominal distention. Denies any  chest pain or shortness of breath or cough. No nausea no vomiting.  Denies any new onset of headaches except since fall.   REVIEW OF SYSTEMS:  A complete 10 point review of system is done which is negative except mentioned above/history of present illness.   PAST MEDICAL HISTORY :  Past Medical History:  Diagnosis Date  . Bladder infection, chronic 10/19/2011  . Cancer (HCC)    Ovarian   . Carcinoma of fallopian tube ([place]) 10/05/2009   Overview:  Overview:  Overview:  Drs. Claiborne Rigg and Delorise Shiner Choksi Overview:  Drs. Claiborne Rigg and [employer]   . Concussion   . GERD (gastroesophageal reflux disease)   . MI (mitral incompetence) 10/27/2014   Overview:  MODERATE   . Mitral valve prolapse   . OAB (overactive bladder)   . Tachycardia     PAST SURGICAL HISTORY :   Past Surgical History:  Procedure Laterality Date  . ABDOMINAL HYSTERECTOMY    . CHOLECYSTECTOMY    . LAPAROSCOPIC BILATERAL SALPINGO OOPHERECTOMY  2011  . ovarian cancer surgery  2011    FAMILY HISTORY :   Family History  Problem Relation Age of Onset  . Ovarian cancer Other   . Breast cancer Neg Hx   . Colon cancer Neg Hx   . Diabetes Neg Hx   . Heart disease Neg Hx   . Kidney disease Neg Hx   . Bladder Cancer Neg Hx     SOCIAL HISTORY:   Social History  Substance Use Topics  . Smoking  status: Never Smoker  . Smokeless tobacco: Never Used  . Alcohol use No    ALLERGIES:  is allergic to benadryl [diphenhydramine]; tamsulosin; alendronate sodium; duloxetine hcl; risedronate sodium; lovastatin; and sulfa antibiotics.  MEDICATIONS:  Current Outpatient Prescriptions  Medication Sig Dispense Refill  . acetaminophen (TYLENOL) 500 MG tablet Take 1 tablet (500 mg total) by mouth every 6 (six) hours as needed. 30 tablet 0  . Ascorbic Acid (VITAMIN C) 100 MG tablet Take 100 mg by mouth daily.    Marland Kitchen aspirin EC 81 MG tablet Take 81 mg by mouth.    . cetirizine (ZYRTEC) 10 MG tablet Take 10 mg by mouth daily.      . Cholecalciferol (VITAMIN D3) 2000 UNITS capsule Take 2,000 Units by mouth daily.     Marland Kitchen docusate sodium (COLACE) 100 MG capsule Take 100 mg by mouth.    . fluticasone (FLONASE) 50 MCG/ACT nasal spray Place 1 spray into both nostrils daily.     Marland Kitchen gabapentin (NEURONTIN) 600 MG tablet TAKE (1) TABLET BY MOUTH THREE TIMES A DAY 180 tablet 0  . metoprolol succinate (TOPROL-XL) 50 MG 24 hr tablet Take 50 mg by mouth daily.    . Multiple Vitamins-Minerals (CENTRUM SILVER PO) Take 1 tablet by mouth daily.     . nitrofurantoin, macrocrystal-monohydrate, (MACROBID) 100 MG capsule Take 1 capsule (100 mg total) by mouth daily. 30 capsule 3  . ondansetron (ZOFRAN) 4 MG tablet TAKE (1) TABLET BY MOUTH EVERY 8 HOURS AS NEEDED FOR NAUSEA/VOMITING 30 tablet 3  . pantoprazole (PROTONIX) 40 MG tablet Take 40 mg by mouth.    . Pyridoxine HCl (VITAMIN B-6) 500 MG tablet Take 500 mg by mouth daily.    . traMADol (ULTRAM) 50 MG tablet 1 tab po q 8 hours prn 10 tablet 0  . triamcinolone ointment (KENALOG) 0.1 % Apply 1 application topically 2 (two) times daily. (Patient taking differently: Apply 1 application topically 2 (two) times daily as needed. ) 30 g 1   No current facility-administered medications for this visit.    Facility-Administered Medications Ordered in Other Visits  Medication Dose Route Frequency Provider Last Rate Last Dose  . diphenhydrAMINE (BENADRYL) injection 50 mg  50 mg Intravenous Once Choksi, Janak, MD      . sodium chloride 0.9 % injection 10 mL  10 mL Intravenous PRN Forest Gleason, MD   10 mL at 01/12/15 1116  . sodium chloride 0.9 % injection 10 mL  10 mL Intravenous PRN Forest Gleason, MD   10 mL at 02/16/15 1025  . sodium chloride 0.9 % injection 10 mL  10 mL Intracatheter PRN [name], MD   10 mL at 08/02/16 1315    PHYSICAL EXAMINATION: ECOG PERFORMANCE STATUS: 2 - Symptomatic, <50% confined to bed  BP 109/71 (BP Location: Left Arm, Patient Position: Sitting)   Pulse  66   Temp 97.4 F (36.3 C) (Tympanic)   Resp 16   Wt 130 lb 4 oz (59.1 kg)   SpO2 98%   BMI 25.44 kg/m   Filed Weights   08/02/16 1140  Weight: 130 lb 4 oz (59.1 kg)    GENERAL: Well-nourished well-developed; Alert, no distress and comfortable.  She is walking herself. Accompanied by daughter. EYES: no pallor or icterus OROPHARYNX: no thrush or ulceration; good dentition  NECK: supple, no masses felt LYMPH:  no palpable lymphadenopathy in the cervical, axillary; Right ingiunal LN ~2-3cm in size.  LUNGS: clear to auscultation and  No wheeze  or crackles HEART/CVS: regular rate & rhythm and no murmurs; chronic right lower extremity swelling ABDOMEN:abdomen soft, non-tender and normal bowel sounds Musculoskeletal:no cyanosis of digits and no clubbing  PSYCH: alert & oriented x 3 with fluent speech NEURO: no focal motor/sensory deficits SKIN:  no rashes or significant lesions.  LABORATORY DATA:  I have reviewed the data as listed    Component Value Date/Time   NA 137 08/02/2016 1101   NA 138 06/02/2014 0910   K 4.2 08/02/2016 1101   K 3.7 06/02/2014 0910   CL 105 08/02/2016 1101   CL 107 06/02/2014 0910   CO2 27 08/02/2016 1101   CO2 26 06/02/2014 0910   GLUCOSE 99 08/02/2016 1101   GLUCOSE 106 (H) 06/02/2014 0910   BUN 19 08/02/2016 1101   BUN 17 06/02/2014 0910   CREATININE 0.81 08/02/2016 1101   CREATININE 1.00 06/02/2014 0910   CALCIUM 8.8 (L) 08/02/2016 1101   CALCIUM 8.6 (L) 06/02/2014 0910   PROT 6.7 08/02/2016 1101   PROT 6.3 (L) 06/02/2014 0910   ALBUMIN 3.5 08/02/2016 1101   ALBUMIN 3.4 (L) 06/02/2014 0910   AST 24 08/02/2016 1101   AST 23 06/02/2014 0910   ALT 12 (L) 08/02/2016 1101   ALT 10 (L) 06/02/2014 0910   ALKPHOS 67 08/02/2016 1101   ALKPHOS 46 06/02/2014 0910   BILITOT 0.5 08/02/2016 1101   BILITOT 0.5 06/02/2014 0910   GFRNONAA >60 08/02/2016 1101   GFRNONAA 52 (L) 06/02/2014 0910   GFRAA >60 08/02/2016 1101   GFRAA >60 06/02/2014 0910     No results found for: SPEP, UPEP  Lab Results  Component Value Date   WBC 4.1 08/02/2016   NEUTROABS 2.2 08/02/2016   HGB 11.5 (L) 08/02/2016   HCT 33.9 (L) 08/02/2016   MCV 90.1 08/02/2016   PLT 398 08/02/2016      Chemistry      Component Value Date/Time   NA 137 08/02/2016 1101   NA 138 06/02/2014 0910   K 4.2 08/02/2016 1101   K 3.7 06/02/2014 0910   CL 105 08/02/2016 1101   CL 107 06/02/2014 0910   CO2 27 08/02/2016 1101   CO2 26 06/02/2014 0910   BUN 19 08/02/2016 1101   BUN 17 06/02/2014 0910   CREATININE 0.81 08/02/2016 1101   CREATININE 1.00 06/02/2014 0910      Component Value Date/Time   CALCIUM 8.8 (L) 08/02/2016 1101   CALCIUM 8.6 (L) 06/02/2014 0910   ALKPHOS 67 08/02/2016 1101   ALKPHOS 46 06/02/2014 0910   AST 24 08/02/2016 1101   AST 23 06/02/2014 0910   ALT 12 (L) 08/02/2016 1101   ALT 10 (L) 06/02/2014 0910   BILITOT 0.5 08/02/2016 1101   BILITOT 0.5 06/02/2014 0910     IMPRESSION: Interval development of superior endplate compression deformities of the L3 and L4 vertebral bodies.  Similar appearance of the right external iliac and inguinal metastatic adenopathy.  Large hiatal hernia.  Aortic atherosclerosis.  Stable small cystic lesion within the pancreatic tail.  These results will be called to the ordering clinician or representative by the Radiologist Assistant, and communication documented in the PACS or zVision Dashboard.   Electronically Signed   By: Lovey Newcomer M.D.   On: 07/30/2016 16:56  RADIOGRAPHIC STUDIES: I have personally reviewed the radiological images as listed and agreed with the findings in the report. No results found.   ASSESSMENT & PLAN:  Malignant neoplasm of right fallopian tube [facility]) Metastatic  fallopian tube cancer- Currently on Avastin since February 2017. June 24th CT scan- STABLE Lymphadenopathy in right groin. No clinical progression noted; except for compression fractures discussed  below. Recent Ca-125-normal at 18.  # proceed with AVASTIN today; Labs today reviewed;  acceptable for treatment today.  UA- no proteinuria.  #  Right R swelling-chronic ? Sec to LN-  STABLE.    # L3/L4- superior end plate Fractures- sec to trauma. Monitor for now.   # 55mm pancreatic lesion- monitor for now. CT- June 2018 shows no new progression  # Chronic UTIs- on macrobid.   # follow up in 3 weeks/labs; infusion; and 6 weeks [same]  # I reviewed the blood work- with the patient in detail; also reviewed the imaging independently [as summarized above]; and with the patient in detail.     No orders of the defined types were placed in this encounter.     [name], MD 08/02/2016 1:34 PM

## 2016-08-02 NOTE — Assessment & Plan Note (Addendum)
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 June 24th CT scan- STABLE Lymphadenopathy in right groin. No clinical progression noted; except for compression fractures discussed below. Recent Ca-125-normal at 18.  # proceed with AVASTIN today; Labs today reviewed;  acceptable for treatment today.  UA- no proteinuria.  #  Right R swelling-chronic ? Sec to LN-  STABLE.    # L3/L4- superior end plate Fractures- sec to trauma. Monitor for now.   # 56mm pancreatic lesion- monitor for now. CT- June 2018 shows no new progression  # Chronic UTIs- on macrobid.   # follow up in 3 weeks/labs; infusion; and 6 weeks [same]  # I reviewed the blood work- with the patient in detail; also reviewed the imaging independently [as summarized above]; and with the patient in detail.

## 2016-08-02 NOTE — Progress Notes (Signed)
Patient here today for follow up.   

## 2016-08-16 ENCOUNTER — Telehealth: Payer: Self-pay | Admitting: *Deleted

## 2016-08-16 NOTE — Telephone Encounter (Signed)
Daughter called stating that she feels sure Mrs Fujii will be admitted to hospital this weekend for CHF and is asking if Dr B is alright with them giving her Lasix if they order it. Please return her call [phone]

## 2016-08-16 NOTE — Telephone Encounter (Signed)
Per VO Dr Rogue Bussing, [place] for Lasix. Daughter informed

## 2016-08-23 ENCOUNTER — Inpatient Hospital Stay: Payer: Medicare HMO | Attending: Internal Medicine | Admitting: Internal Medicine

## 2016-08-23 ENCOUNTER — Inpatient Hospital Stay: Payer: Medicare HMO

## 2016-08-23 VITALS — BP 108/68 | HR 65 | Temp 96.5°F | Resp 16 | Wt 132.6 lb

## 2016-08-23 DIAGNOSIS — C5701 Malignant neoplasm of right fallopian tube: Secondary | ICD-10-CM | POA: Diagnosis not present

## 2016-08-23 DIAGNOSIS — Z90710 Acquired absence of both cervix and uterus: Secondary | ICD-10-CM | POA: Diagnosis not present

## 2016-08-23 DIAGNOSIS — N3281 Overactive bladder: Secondary | ICD-10-CM

## 2016-08-23 DIAGNOSIS — Z86718 Personal history of other venous thrombosis and embolism: Secondary | ICD-10-CM | POA: Diagnosis not present

## 2016-08-23 DIAGNOSIS — Z7982 Long term (current) use of aspirin: Secondary | ICD-10-CM | POA: Diagnosis not present

## 2016-08-23 DIAGNOSIS — C774 Secondary and unspecified malignant neoplasm of inguinal and lower limb lymph nodes: Secondary | ICD-10-CM | POA: Insufficient documentation

## 2016-08-23 DIAGNOSIS — Z79899 Other long term (current) drug therapy: Secondary | ICD-10-CM | POA: Diagnosis not present

## 2016-08-23 DIAGNOSIS — R600 Localized edema: Secondary | ICD-10-CM | POA: Insufficient documentation

## 2016-08-23 DIAGNOSIS — I252 Old myocardial infarction: Secondary | ICD-10-CM

## 2016-08-23 DIAGNOSIS — Z9221 Personal history of antineoplastic chemotherapy: Secondary | ICD-10-CM | POA: Insufficient documentation

## 2016-08-23 DIAGNOSIS — K219 Gastro-esophageal reflux disease without esophagitis: Secondary | ICD-10-CM | POA: Insufficient documentation

## 2016-08-23 DIAGNOSIS — Z90722 Acquired absence of ovaries, bilateral: Secondary | ICD-10-CM

## 2016-08-23 DIAGNOSIS — R0609 Other forms of dyspnea: Secondary | ICD-10-CM

## 2016-08-23 DIAGNOSIS — K449 Diaphragmatic hernia without obstruction or gangrene: Secondary | ICD-10-CM | POA: Diagnosis not present

## 2016-08-23 DIAGNOSIS — Z87440 Personal history of urinary (tract) infections: Secondary | ICD-10-CM | POA: Insufficient documentation

## 2016-08-23 DIAGNOSIS — I341 Nonrheumatic mitral (valve) prolapse: Secondary | ICD-10-CM | POA: Insufficient documentation

## 2016-08-23 DIAGNOSIS — I700 Atherosclerosis of aorta: Secondary | ICD-10-CM

## 2016-08-23 DIAGNOSIS — Z923 Personal history of irradiation: Secondary | ICD-10-CM | POA: Diagnosis not present

## 2016-08-23 LAB — CBC WITH DIFFERENTIAL/PLATELET
BASOS PCT: 1 %
Basophils Absolute: 0 10*3/uL (ref 0–0.1)
Eosinophils Absolute: 0 10*3/uL (ref 0–0.7)
Eosinophils Relative: 1 %
HCT: 34.3 % — ABNORMAL LOW (ref 35.0–47.0)
HEMOGLOBIN: 11.4 g/dL — AB (ref 12.0–16.0)
LYMPHS ABS: 1.1 10*3/uL (ref 1.0–3.6)
Lymphocytes Relative: 27 %
MCH: 30.1 pg (ref 26.0–34.0)
MCHC: 33.3 g/dL (ref 32.0–36.0)
MCV: 90.7 fL (ref 80.0–100.0)
MONO ABS: 0.6 10*3/uL (ref 0.2–0.9)
Monocytes Relative: 16 %
NEUTROS ABS: 2.3 10*3/uL (ref 1.4–6.5)
Neutrophils Relative %: 55 %
PLATELETS: 425 10*3/uL (ref 150–440)
RBC: 3.78 MIL/uL — ABNORMAL LOW (ref 3.80–5.20)
RDW: 15.4 % — ABNORMAL HIGH (ref 11.5–14.5)
WBC: 4.1 10*3/uL (ref 3.6–11.0)

## 2016-08-23 LAB — COMPREHENSIVE METABOLIC PANEL
ALK PHOS: 52 U/L (ref 38–126)
ALT: 14 U/L (ref 14–54)
AST: 29 U/L (ref 15–41)
Albumin: 3.4 g/dL — ABNORMAL LOW (ref 3.5–5.0)
Anion gap: 6 (ref 5–15)
BUN: 22 mg/dL — AB (ref 6–20)
CHLORIDE: 105 mmol/L (ref 101–111)
CO2: 28 mmol/L (ref 22–32)
CREATININE: 0.9 mg/dL (ref 0.44–1.00)
Calcium: 8.9 mg/dL (ref 8.9–10.3)
GFR calc Af Amer: >60 mL/min (ref >60)
GFR calc non Af Amer: 56 mL/min — ABNORMAL LOW (ref >60)
GLUCOSE: 123 mg/dL — AB (ref 65–99)
Potassium: 3.9 mmol/L (ref 3.5–5.1)
SODIUM: 139 mmol/L (ref 135–145)
Total Bilirubin: 0.5 mg/dL (ref 0.3–1.2)
Total Protein: 6.8 g/dL (ref 6.5–8.1)

## 2016-08-23 LAB — BRAIN NATRIURETIC PEPTIDE: B Natriuretic Peptide: 217 pg/mL — ABNORMAL HIGH (ref 0.0–100.0)

## 2016-08-23 MED ORDER — SODIUM CHLORIDE 0.9% FLUSH
10.0000 mL | Freq: Once | INTRAVENOUS | Status: AC
  Administered 2016-08-23: 10 mL via INTRAVENOUS
  Filled 2016-08-23: qty 10

## 2016-08-23 MED ORDER — FUROSEMIDE 20 MG PO TABS: 20.0000 mg | ORAL_TABLET | Freq: Every day | ORAL | 0 refills | Status: DC

## 2016-08-23 MED ORDER — HEPARIN SOD (PORK) LOCK FLUSH 100 UNIT/ML IV SOLN
500.0000 [IU] | Freq: Once | INTRAVENOUS | Status: AC
  Administered 2016-08-23: 500 [IU] via INTRAVENOUS
  Filled 2016-08-23: qty 5

## 2016-08-23 NOTE — Progress Notes (Signed)
Patient is here today for a follow up. Patient states that she has no new concerns today.

## 2016-08-23 NOTE — Progress Notes (Signed)
[place] OFFICE PROGRESS NOTE  Patient Care Team: Ezequiel Kayser, [name]  No matching staging information was found for the patient.   Oncology History   . 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  Adequate TRS, no macroscopic residual. IP/IV chemotherapy with DDP and paclitaxel on GOG protocol, chemo and Bev consolidation completed in 03/2011 2. 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 November 14, 2011- Patient was started on carboplatin and gemcitabine 4. Finished 6 cycles of chemotherapy in April of 2014 with carboplatin and gemcitabine. Tolerance was fairly good except for neutropenia and thrombocytopenia. 5.recurrent disease by CT scan February of 2015  # .radiation therapy to pelvis  May of 2015  7.upper extremity  . and deep vein thrombosis associated with port.(June of 2015) patient started on   [place] had port removed and had   thrombectomy September 06, 2013. # CT DEC 2016- progressing disease in the right inguinal area # FEB-TAXOL-AVASTIN;  # FEB 2017- AVASTIN q 3W; July 2017- CT- Improved  Right Inguinal LN; STOP AVASTIN sec to potential concerns of AEs  # AUG 2017 3rd- START ZEJULA- declines sec to intol  # OCT 7th CT- STABLE RIGHT INGUINAL LN- RE-START AVASTIN q 3W       Malignant neoplasm of right fallopian tube ([place])   10/05/2009 Initial Diagnosis    Carcinoma of fallopian tube        INTERVAL HISTORY:  Nicole Clayton 83 y.o.  female pleasant patient above history of Fallopian tube cancer metastatic currently  Avastin is here for follow-up.  In the interim, patient was evaluated by cardiology for worsening bilateral legs swelling. She is awaiting a 2-D echo.  Patient has mild-to-moderate shortness of breath on exertion. She also has difficulty laying on her back at nighttime.  Denies any abdominal distention. Denies any  chest pain. No nausea no vomiting.   REVIEW OF SYSTEMS:  A complete 10 point review of system is done which is negative except mentioned above/history of present illness.   PAST MEDICAL HISTORY :  Past Medical History:  Diagnosis Date  . Bladder infection, chronic 10/19/2011  . Cancer (HCC)    Ovarian   . Carcinoma of fallopian tube ([place]) 10/05/2009   Overview:  Overview:  Overview:  Drs. Claiborne Rigg and Delorise Shiner Choksi Overview:  Drs. Claiborne Rigg and [employer]   . Concussion   . GERD (gastroesophageal reflux disease)   . MI (mitral incompetence) 10/27/2014   Overview:  MODERATE   . Mitral valve prolapse   . OAB (overactive bladder)   . Tachycardia     PAST SURGICAL HISTORY :   Past Surgical History:  Procedure Laterality Date  . ABDOMINAL HYSTERECTOMY    . CHOLECYSTECTOMY    . LAPAROSCOPIC BILATERAL SALPINGO OOPHERECTOMY  2011  . ovarian cancer surgery  2011    FAMILY HISTORY :   Family History  Problem Relation Age of Onset  . Ovarian cancer Other   . Breast cancer Neg Hx   . Colon cancer Neg Hx   . Diabetes Neg Hx   . Heart disease Neg Hx   . Kidney disease Neg Hx   . Bladder Cancer Neg Hx     SOCIAL HISTORY:   Social History  Substance Use Topics  . Smoking status: Never Smoker  . Smokeless tobacco: Never Used  . Alcohol use No    ALLERGIES:  is allergic to benadryl [diphenhydramine]; tamsulosin; alendronate sodium; duloxetine hcl; risedronate sodium; lovastatin; and sulfa antibiotics.  MEDICATIONS:  Current Outpatient Prescriptions  Medication Sig Dispense Refill  . acetaminophen (TYLENOL) 500 MG tablet Take 1 tablet (500 mg total) by mouth every 6 (six) hours as needed. 30 tablet 0  . Ascorbic Acid (VITAMIN C) 100 MG tablet Take 100 mg by mouth daily.    Marland Kitchen aspirin EC 81 MG tablet Take 81 mg by mouth.    . cetirizine (ZYRTEC) 10 MG tablet Take 10 mg by mouth daily.     . Cholecalciferol (VITAMIN D3) 2000 UNITS capsule Take 2,000 Units by mouth daily.      Marland Kitchen docusate sodium (COLACE) 100 MG capsule Take 100 mg by mouth.    . fluticasone (FLONASE) 50 MCG/ACT nasal spray Place 1 spray into both nostrils daily.     Marland Kitchen gabapentin (NEURONTIN) 600 MG tablet TAKE (1) TABLET BY MOUTH THREE TIMES A DAY 180 tablet 0  . metoprolol succinate (TOPROL-XL) 50 MG 24 hr tablet Take 50 mg by mouth daily.    . nitrofurantoin, macrocrystal-monohydrate, (MACROBID) 100 MG capsule Take 1 capsule (100 mg total) by mouth daily. 30 capsule 3  . ondansetron (ZOFRAN) 4 MG tablet TAKE (1) TABLET BY MOUTH EVERY 8 HOURS AS NEEDED FOR NAUSEA/VOMITING 30 tablet 3  . pantoprazole (PROTONIX) 40 MG tablet Take 40 mg by mouth.    . Pyridoxine HCl (VITAMIN B-6) 500 MG tablet Take 500 mg by mouth daily.    Marland Kitchen triamcinolone ointment (KENALOG) 0.1 % Apply 1 application topically 2 (two) times daily. (Patient taking differently: Apply 1 application topically 2 (two) times daily as needed. ) 30 g 1  . furosemide (LASIX) 20 MG tablet Take 1 tablet (20 mg total) by mouth daily. 30 tablet 0  . Multiple Vitamins-Minerals (CENTRUM SILVER PO) Take 1 tablet by mouth daily.     . traMADol (ULTRAM) 50 MG tablet 1 tab po q 8 hours prn (Patient not taking: Reported on 08/23/2016) 10 tablet 0   No current facility-administered medications for this visit.    Facility-Administered Medications Ordered in Other Visits  Medication Dose Route Frequency Provider Last Rate Last Dose  . diphenhydrAMINE (BENADRYL) injection 50 mg  50 mg Intravenous Once Choksi, Janak, MD      . sodium chloride 0.9 % injection 10 mL  10 mL Intravenous PRN Forest Gleason, MD   10 mL at 01/12/15 1116  . sodium chloride 0.9 % injection 10 mL  10 mL Intravenous PRN Forest Gleason, MD   10 mL at 02/16/15 1025    PHYSICAL EXAMINATION: ECOG PERFORMANCE STATUS: 2 - Symptomatic, <50% confined to bed  BP 108/68 (BP Location: Left Arm)   Pulse 65   Temp (!) 96.5 F (35.8 C) (Tympanic)   Resp 16   Wt 132 lb 9.6 oz (60.1 kg)   BMI  25.90 kg/m   Filed Weights   08/23/16 1042  Weight: 132 lb 9.6 oz (60.1 kg)    GENERAL: Well-nourished well-developed; Alert, no distress and comfortable.  She is walking herself. Accompanied by daughter. EYES: no pallor or icterus OROPHARYNX: no thrush or ulceration; good dentition  NECK: supple, no masses felt LYMPH:  no palpable lymphadenopathy in the cervical, axillary; Right ingiunal LN ~2-3cm in size.  LUNGS: clear to auscultation and  No wheeze or crackles HEART/CVS: regular rate & rhythm and no murmurs; chronic right lower extremity swelling ABDOMEN:abdomen soft, non-tender and normal bowel sounds Musculoskeletal:no cyanosis of  digits and no clubbing  PSYCH: alert & oriented x 3 with fluent speech NEURO: no focal motor/sensory deficits SKIN:  no rashes or significant lesions.  LABORATORY DATA:  I have reviewed the data as listed    Component Value Date/Time   NA 139 08/23/2016 0959   NA 138 06/02/2014 0910   K 3.9 08/23/2016 0959   K 3.7 06/02/2014 0910   CL 105 08/23/2016 0959   CL 107 06/02/2014 0910   CO2 28 08/23/2016 0959   CO2 26 06/02/2014 0910   GLUCOSE 123 (H) 08/23/2016 0959   GLUCOSE 106 (H) 06/02/2014 0910   BUN 22 (H) 08/23/2016 0959   BUN 17 06/02/2014 0910   CREATININE 0.90 08/23/2016 0959   CREATININE 1.00 06/02/2014 0910   CALCIUM 8.9 08/23/2016 0959   CALCIUM 8.6 (L) 06/02/2014 0910   PROT 6.8 08/23/2016 0959   PROT 6.3 (L) 06/02/2014 0910   ALBUMIN 3.4 (L) 08/23/2016 0959   ALBUMIN 3.4 (L) 06/02/2014 0910   AST 29 08/23/2016 0959   AST 23 06/02/2014 0910   ALT 14 08/23/2016 0959   ALT 10 (L) 06/02/2014 0910   ALKPHOS 52 08/23/2016 0959   ALKPHOS 46 06/02/2014 0910   BILITOT 0.5 08/23/2016 0959   BILITOT 0.5 06/02/2014 0910   GFRNONAA 56 (L) 08/23/2016 0959   GFRNONAA 52 (L) 06/02/2014 0910   GFRAA >60 08/23/2016 0959   GFRAA >60 06/02/2014 0910    No results found for: SPEP, UPEP  Lab Results  Component Value Date   WBC 4.1  08/23/2016   NEUTROABS 2.3 08/23/2016   HGB 11.4 (L) 08/23/2016   HCT 34.3 (L) 08/23/2016   MCV 90.7 08/23/2016   PLT 425 08/23/2016      Chemistry      Component Value Date/Time   NA 139 08/23/2016 0959   NA 138 06/02/2014 0910   K 3.9 08/23/2016 0959   K 3.7 06/02/2014 0910   CL 105 08/23/2016 0959   CL 107 06/02/2014 0910   CO2 28 08/23/2016 0959   CO2 26 06/02/2014 0910   BUN 22 (H) 08/23/2016 0959   BUN 17 06/02/2014 0910   CREATININE 0.90 08/23/2016 0959   CREATININE 1.00 06/02/2014 0910      Component Value Date/Time   CALCIUM 8.9 08/23/2016 0959   CALCIUM 8.6 (L) 06/02/2014 0910   ALKPHOS 52 08/23/2016 0959   ALKPHOS 46 06/02/2014 0910   AST 29 08/23/2016 0959   AST 23 06/02/2014 0910   ALT 14 08/23/2016 0959   ALT 10 (L) 06/02/2014 0910   BILITOT 0.5 08/23/2016 0959   BILITOT 0.5 06/02/2014 0910     IMPRESSION: Interval development of superior endplate compression deformities of the L3 and L4 vertebral bodies.  Similar appearance of the right external iliac and inguinal metastatic adenopathy.  Large hiatal hernia.  Aortic atherosclerosis.  Stable small cystic lesion within the pancreatic tail.  These results will be called to the ordering clinician or representative by the Radiologist Assistant, and communication documented in the PACS or zVision Dashboard.   Electronically Signed   By: Lovey Newcomer M.D.   On: 07/30/2016 16:56  RADIOGRAPHIC STUDIES: I have personally reviewed the radiological images as listed and agreed with the findings in the report. No results found.   ASSESSMENT & PLAN:  Malignant neoplasm of right fallopian tube [facility]) Metastatic fallopian tube cancer- Currently on Avastin since February 2017. June 24th CT scan- STABLE Lymphadenopathy in right groin. No clinical progression noted; except for  compression fractures discussed below. Recent Ca-125-normal at 18.  # HOLD AVASTIN today-given the concerns of CHF [C  discussion below]; Labs today reviewed; urinalysis shows no proteinuria   # Bilateral leg swelling/worsening shortness of breath- question CHF. Awaiting evaluation with cardiology. Recommend Lasix 20 mg a day; check weight daily; stop if less than 128 pounds. Check BNP today.  # 47mm pancreatic lesion- monitor for now. CT- June 2018 shows no new progression  # Chronic UTIs- on macrobid.   # follow up in 4 weeks/labs [postcardiac evaluation]; labs Avastin infusion.    Orders Placed This Encounter  Procedures  . Brain natriuretic peptide    Standing Status:   Future    Number of Occurrences:   1    Standing Expiration Date:   08/23/2017      [name], MD 08/23/2016 5:42 PM

## 2016-08-23 NOTE — Assessment & Plan Note (Addendum)
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 June 24th CT scan- STABLE Lymphadenopathy in right groin. No clinical progression noted; except for compression fractures discussed below. Recent Ca-125-normal at 18.  # HOLD AVASTIN today-given the concerns of CHF [C discussion below]; Labs today reviewed; urinalysis shows no proteinuria   # Bilateral leg swelling/worsening shortness of breath- question CHF. Awaiting evaluation with cardiology. Recommend Lasix 20 mg a day; check weight daily; stop if less than 128 pounds. Check BNP today.  # 44mm pancreatic lesion- monitor for now. CT- June 2018 shows no new progression  # Chronic UTIs- on macrobid.   # follow up in 4 weeks/labs [postcardiac evaluation]; labs Avastin infusion.

## 2016-08-26 ENCOUNTER — Ambulatory Visit: Payer: Medicare HMO

## 2016-08-26 ENCOUNTER — Other Ambulatory Visit: Payer: Medicare HMO

## 2016-08-26 ENCOUNTER — Ambulatory Visit: Payer: Medicare HMO | Admitting: Internal Medicine

## 2016-09-13 ENCOUNTER — Other Ambulatory Visit: Payer: Medicare HMO

## 2016-09-13 ENCOUNTER — Ambulatory Visit: Payer: Medicare HMO

## 2016-09-13 ENCOUNTER — Ambulatory Visit: Payer: Medicare HMO | Admitting: Internal Medicine

## 2016-09-16 ENCOUNTER — Other Ambulatory Visit: Payer: Self-pay | Admitting: Internal Medicine

## 2016-09-16 NOTE — Telephone Encounter (Signed)
Rx called to pharmacy as there was an error in e scribing

## 2016-09-16 NOTE — Telephone Encounter (Signed)
Done

## 2016-09-17 DIAGNOSIS — I340 Nonrheumatic mitral (valve) insufficiency: Secondary | ICD-10-CM | POA: Diagnosis not present

## 2016-09-17 DIAGNOSIS — I5021 Acute systolic (congestive) heart failure: Secondary | ICD-10-CM | POA: Diagnosis not present

## 2016-09-17 DIAGNOSIS — R600 Localized edema: Secondary | ICD-10-CM | POA: Diagnosis not present

## 2016-09-17 DIAGNOSIS — I10 Essential (primary) hypertension: Secondary | ICD-10-CM | POA: Diagnosis not present

## 2016-09-17 DIAGNOSIS — I341 Nonrheumatic mitral (valve) prolapse: Secondary | ICD-10-CM | POA: Diagnosis not present

## 2016-09-20 ENCOUNTER — Other Ambulatory Visit: Payer: Self-pay | Admitting: *Deleted

## 2016-09-20 ENCOUNTER — Inpatient Hospital Stay (HOSPITAL_BASED_OUTPATIENT_CLINIC_OR_DEPARTMENT_OTHER): Payer: Medicare HMO | Admitting: Internal Medicine

## 2016-09-20 ENCOUNTER — Inpatient Hospital Stay: Payer: Medicare HMO | Attending: Internal Medicine

## 2016-09-20 ENCOUNTER — Inpatient Hospital Stay: Payer: Medicare HMO

## 2016-09-20 VITALS — BP 112/71 | HR 47 | Temp 96.9°F | Resp 16 | Wt 129.0 lb

## 2016-09-20 DIAGNOSIS — I700 Atherosclerosis of aorta: Secondary | ICD-10-CM

## 2016-09-20 DIAGNOSIS — C5701 Malignant neoplasm of right fallopian tube: Secondary | ICD-10-CM

## 2016-09-20 DIAGNOSIS — Z5112 Encounter for antineoplastic immunotherapy: Secondary | ICD-10-CM | POA: Diagnosis not present

## 2016-09-20 DIAGNOSIS — Z86718 Personal history of other venous thrombosis and embolism: Secondary | ICD-10-CM | POA: Insufficient documentation

## 2016-09-20 DIAGNOSIS — Z9221 Personal history of antineoplastic chemotherapy: Secondary | ICD-10-CM | POA: Insufficient documentation

## 2016-09-20 DIAGNOSIS — Z923 Personal history of irradiation: Secondary | ICD-10-CM

## 2016-09-20 DIAGNOSIS — K449 Diaphragmatic hernia without obstruction or gangrene: Secondary | ICD-10-CM

## 2016-09-20 DIAGNOSIS — R590 Localized enlarged lymph nodes: Secondary | ICD-10-CM | POA: Diagnosis not present

## 2016-09-20 DIAGNOSIS — Z79899 Other long term (current) drug therapy: Secondary | ICD-10-CM | POA: Diagnosis not present

## 2016-09-20 DIAGNOSIS — Z7982 Long term (current) use of aspirin: Secondary | ICD-10-CM | POA: Diagnosis not present

## 2016-09-20 DIAGNOSIS — N3020 Other chronic cystitis without hematuria: Secondary | ICD-10-CM

## 2016-09-20 DIAGNOSIS — N3281 Overactive bladder: Secondary | ICD-10-CM

## 2016-09-20 DIAGNOSIS — K219 Gastro-esophageal reflux disease without esophagitis: Secondary | ICD-10-CM | POA: Diagnosis not present

## 2016-09-20 DIAGNOSIS — Z87440 Personal history of urinary (tract) infections: Secondary | ICD-10-CM | POA: Diagnosis not present

## 2016-09-20 DIAGNOSIS — I341 Nonrheumatic mitral (valve) prolapse: Secondary | ICD-10-CM | POA: Diagnosis not present

## 2016-09-20 DIAGNOSIS — Z90722 Acquired absence of ovaries, bilateral: Secondary | ICD-10-CM | POA: Diagnosis not present

## 2016-09-20 DIAGNOSIS — C774 Secondary and unspecified malignant neoplasm of inguinal and lower limb lymph nodes: Secondary | ICD-10-CM | POA: Diagnosis not present

## 2016-09-20 DIAGNOSIS — R600 Localized edema: Secondary | ICD-10-CM | POA: Insufficient documentation

## 2016-09-20 DIAGNOSIS — Z90710 Acquired absence of both cervix and uterus: Secondary | ICD-10-CM

## 2016-09-20 LAB — COMPREHENSIVE METABOLIC PANEL
ALBUMIN: 3.6 g/dL (ref 3.5–5.0)
ALK PHOS: 54 U/L (ref 38–126)
ALT: 14 U/L (ref 14–54)
ANION GAP: 10 (ref 5–15)
AST: 23 U/L (ref 15–41)
BUN: 22 mg/dL — ABNORMAL HIGH (ref 6–20)
CALCIUM: 8.8 mg/dL — AB (ref 8.9–10.3)
CHLORIDE: 104 mmol/L (ref 101–111)
CO2: 25 mmol/L (ref 22–32)
Creatinine, Ser: 0.98 mg/dL (ref 0.44–1.00)
GFR calc Af Amer: 59 mL/min — ABNORMAL LOW (ref >60)
GFR calc non Af Amer: 51 mL/min — ABNORMAL LOW (ref >60)
GLUCOSE: 99 mg/dL (ref 65–99)
Potassium: 4.2 mmol/L (ref 3.5–5.1)
SODIUM: 139 mmol/L (ref 135–145)
Total Bilirubin: 0.6 mg/dL (ref 0.3–1.2)
Total Protein: 7 g/dL (ref 6.5–8.1)

## 2016-09-20 LAB — CBC WITH DIFFERENTIAL/PLATELET
BASOS PCT: 1 %
Basophils Absolute: 0 10*3/uL (ref 0–0.1)
EOS ABS: 0.1 10*3/uL (ref 0–0.7)
EOS PCT: 2 %
HCT: 35 % (ref 35.0–47.0)
HEMOGLOBIN: 11.8 g/dL — AB (ref 12.0–16.0)
Lymphocytes Relative: 32 %
Lymphs Abs: 1.2 10*3/uL (ref 1.0–3.6)
MCH: 30.3 pg (ref 26.0–34.0)
MCHC: 33.6 g/dL (ref 32.0–36.0)
MCV: 90.3 fL (ref 80.0–100.0)
Monocytes Absolute: 0.6 10*3/uL (ref 0.2–0.9)
Monocytes Relative: 15 %
NEUTROS PCT: 50 %
Neutro Abs: 2 10*3/uL (ref 1.4–6.5)
PLATELETS: 402 10*3/uL (ref 150–440)
RBC: 3.87 MIL/uL (ref 3.80–5.20)
RDW: 14.8 % — ABNORMAL HIGH (ref 11.5–14.5)
WBC: 3.9 10*3/uL (ref 3.6–11.0)

## 2016-09-20 LAB — URINALYSIS, COMPLETE (UACMP) WITH MICROSCOPIC
BACTERIA UA: NONE SEEN
Bilirubin Urine: NEGATIVE
Glucose, UA: NEGATIVE mg/dL
Hgb urine dipstick: NEGATIVE
KETONES UR: NEGATIVE mg/dL
Nitrite: NEGATIVE
PROTEIN: NEGATIVE mg/dL
Specific Gravity, Urine: 1.011 (ref 1.005–1.030)
pH: 7 (ref 5.0–8.0)

## 2016-09-20 MED ORDER — SODIUM CHLORIDE 0.9 % IV SOLN
Freq: Once | INTRAVENOUS | Status: AC
  Administered 2016-09-20: 13:00:00 via INTRAVENOUS
  Filled 2016-09-20: qty 1000

## 2016-09-20 MED ORDER — HEPARIN SOD (PORK) LOCK FLUSH 100 UNIT/ML IV SOLN
500.0000 [IU] | Freq: Once | INTRAVENOUS | Status: AC | PRN
  Administered 2016-09-20: 500 [IU]
  Filled 2016-09-20: qty 5

## 2016-09-20 MED ORDER — SODIUM CHLORIDE 0.9 % IV SOLN
600.0000 mg | Freq: Once | INTRAVENOUS | Status: AC
  Administered 2016-09-20: 600 mg via INTRAVENOUS
  Filled 2016-09-20: qty 16

## 2016-09-20 NOTE — Progress Notes (Signed)
Change avastin dose to 600mg  to avoid waste (within 10% )

## 2016-09-20 NOTE — Progress Notes (Signed)
Patient is here for a follow up today.  Patient states she would like to discuss other alternatives to Lasix.

## 2016-09-20 NOTE — Progress Notes (Signed)
[place] OFFICE PROGRESS NOTE  Patient Care Team: Ezequiel Kayser, [name]  No matching staging information was found for the patient.   Oncology History   . 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  Adequate TRS, no macroscopic residual. IP/IV chemotherapy with DDP and paclitaxel on GOG protocol, chemo and Bev consolidation completed in 03/2011 2. 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 November 14, 2011- Patient was started on carboplatin and gemcitabine 4. Finished 6 cycles of chemotherapy in April of 2014 with carboplatin and gemcitabine. Tolerance was fairly good except for neutropenia and thrombocytopenia. 5.recurrent disease by CT scan February of 2015  # .radiation therapy to pelvis  May of 2015  7.upper extremity  . and deep vein thrombosis associated with port.(June of 2015) patient started on   [place] had port removed and had   thrombectomy September 06, 2013. # CT DEC 2016- progressing disease in the right inguinal area # FEB-TAXOL-AVASTIN;  # FEB 2017- AVASTIN q 3W; July 2017- CT- Improved  Right Inguinal LN; STOP AVASTIN sec to potential concerns of AEs  # AUG 2017 3rd- START ZEJULA- declines sec to intol  # OCT 7th CT- STABLE RIGHT INGUINAL LN- RE-START AVASTIN q 3W       Malignant neoplasm of right fallopian tube ([place])   10/05/2009 Initial Diagnosis    Carcinoma of fallopian tube        INTERVAL HISTORY:  Nicole Clayton 83 y.o.  female pleasant patient above history of Fallopian tube cancer metastatic currently  Avastin is here Follow-up.  In the interim patient underwent cardiology evaluation- 4 bilateral lower extremity swelling- 2-D echo showed normal ejection fraction.   She continues to have swelling in the legs- right more than left. Denies any shortness of breath or chest pain. Noted to have dysuria burning sensation  with urination. No fevers. No unusual back pain or chills.  Denies any abdominal distention. Denies any chest pain. No nausea no vomiting. She denies any headaches. Denies any epistaxis.  REVIEW OF SYSTEMS:  A complete 10 point review of system is done which is negative except mentioned above/history of present illness.   PAST MEDICAL HISTORY :  Past Medical History:  Diagnosis Date  . Bladder infection, chronic 10/19/2011  . Cancer (HCC)    Ovarian   . Carcinoma of fallopian tube ([place]) 10/05/2009   Overview:  Overview:  Overview:  Drs. Claiborne Rigg and Delorise Shiner Choksi Overview:  Drs. Claiborne Rigg and [employer]   . Concussion   . GERD (gastroesophageal reflux disease)   . MI (mitral incompetence) 10/27/2014   Overview:  MODERATE   . Mitral valve prolapse   . OAB (overactive bladder)   . Tachycardia     PAST SURGICAL HISTORY :   Past Surgical History:  Procedure Laterality Date  . ABDOMINAL HYSTERECTOMY    . CHOLECYSTECTOMY    . LAPAROSCOPIC BILATERAL SALPINGO OOPHERECTOMY  2011  . ovarian cancer surgery  2011    FAMILY HISTORY :   Family History  Problem Relation Age of Onset  . Ovarian cancer Other   . Breast cancer Neg Hx   . Colon cancer Neg Hx   . Diabetes Neg Hx   . Heart disease Neg Hx   . Kidney disease Neg Hx   . Bladder Cancer Neg Hx     SOCIAL HISTORY:   Social History  Substance Use  Topics  . Smoking status: Never Smoker  . Smokeless tobacco: Never Used  . Alcohol use No    ALLERGIES:  is allergic to benadryl [diphenhydramine]; tamsulosin; alendronate sodium; duloxetine hcl; risedronate sodium; lovastatin; and sulfa antibiotics.  MEDICATIONS:  Current Outpatient Prescriptions  Medication Sig Dispense Refill  . acetaminophen (TYLENOL) 500 MG tablet Take 1 tablet (500 mg total) by mouth every 6 (six) hours as needed. 30 tablet 0  . Ascorbic Acid (VITAMIN C) 100 MG tablet Take 100 mg by mouth daily.    Marland Kitchen aspirin EC 81 MG tablet Take 81 mg by mouth.     . cetirizine (ZYRTEC) 10 MG tablet Take 10 mg by mouth daily.     . Cholecalciferol (VITAMIN D3) 2000 UNITS capsule Take 2,000 Units by mouth daily.     Marland Kitchen docusate sodium (COLACE) 100 MG capsule Take 100 mg by mouth.    . fluticasone (FLONASE) 50 MCG/ACT nasal spray Place 1 spray into both nostrils daily.     Marland Kitchen gabapentin (NEURONTIN) 600 MG tablet TAKE (1) TABLET BY MOUTH THREE TIMES A DAY 180 tablet 0  . metoprolol succinate (TOPROL-XL) 50 MG 24 hr tablet Take 50 mg by mouth daily.    . nitrofurantoin (MACRODANTIN) 100 MG capsule TAKE ONE (1) CAPSULE EACH DAY. 30 capsule 2  . ondansetron (ZOFRAN) 4 MG tablet TAKE (1) TABLET BY MOUTH EVERY 8 HOURS AS NEEDED FOR NAUSEA/VOMITING 30 tablet 3  . pantoprazole (PROTONIX) 40 MG tablet Take 40 mg by mouth.    . Pyridoxine HCl (VITAMIN B-6) 500 MG tablet Take 500 mg by mouth daily.    . traMADol (ULTRAM) 50 MG tablet 1 tab po q 8 hours prn 10 tablet 0  . triamcinolone ointment (KENALOG) 0.1 % Apply 1 application topically 2 (two) times daily. (Patient taking differently: Apply 1 application topically 2 (two) times daily as needed. ) 30 g 1  . furosemide (LASIX) 20 MG tablet Take 1 tablet (20 mg total) by mouth daily. (Patient not taking: Reported on 09/20/2016) 30 tablet 0  . Multiple Vitamins-Minerals (CENTRUM SILVER PO) Take 1 tablet by mouth daily.     . nitrofurantoin, macrocrystal-monohydrate, (MACROBID) 100 MG capsule Take 1 capsule (100 mg total) by mouth daily. (Patient not taking: Reported on 09/20/2016) 30 capsule 3   No current facility-administered medications for this visit.    Facility-Administered Medications Ordered in Other Visits  Medication Dose Route Frequency Provider Last Rate Last Dose  . bevacizumab (AVASTIN) 600 mg in sodium chloride 0.9 % 100 mL chemo infusion  600 mg Intravenous Once Charlaine Dalton R, MD      . diphenhydrAMINE (BENADRYL) injection 50 mg  50 mg Intravenous Once Choksi, Janak, MD      . heparin lock flush  100 unit/mL  500 Units Intracatheter Once PRN Charlaine Dalton R, MD      . sodium chloride 0.9 % injection 10 mL  10 mL Intravenous PRN Forest Gleason, MD   10 mL at 01/12/15 1116  . sodium chloride 0.9 % injection 10 mL  10 mL Intravenous PRN Forest Gleason, MD   10 mL at 02/16/15 1025    PHYSICAL EXAMINATION: ECOG PERFORMANCE STATUS: 2 - Symptomatic, <50% confined to bed  BP 112/71   Pulse (!) 47   Temp (!) 96.9 F (36.1 C) (Tympanic)   Resp 16   Wt 129 lb (58.5 kg)   BMI 25.19 kg/m   Filed Weights   09/20/16 1125  Weight: 129 lb (58.5  kg)    GENERAL: Well-nourished well-developed; Alert, no distress and comfortable.  She is walking herself. Accompanied by daughter. EYES: no pallor or icterus OROPHARYNX: no thrush or ulceration; good dentition  NECK: supple, no masses felt LYMPH:  no palpable lymphadenopathy in the cervical, axillary; Right ingiunal LN ~2-3cm in size.  LUNGS: clear to auscultation and  No wheeze or crackles HEART/CVS: regular rate & rhythm and no murmurs; chronic right lower extremity swelling ABDOMEN:abdomen soft, non-tender and normal bowel sounds Musculoskeletal:no cyanosis of digits and no clubbing  PSYCH: alert & oriented x 3 with fluent speech NEURO: no focal motor/sensory deficits SKIN:  no rashes or significant lesions.  LABORATORY DATA:  I have reviewed the data as listed    Component Value Date/Time   NA 139 09/20/2016 1103   NA 138 06/02/2014 0910   K 4.2 09/20/2016 1103   K 3.7 06/02/2014 0910   CL 104 09/20/2016 1103   CL 107 06/02/2014 0910   CO2 25 09/20/2016 1103   CO2 26 06/02/2014 0910   GLUCOSE 99 09/20/2016 1103   GLUCOSE 106 (H) 06/02/2014 0910   BUN 22 (H) 09/20/2016 1103   BUN 17 06/02/2014 0910   CREATININE 0.98 09/20/2016 1103   CREATININE 1.00 06/02/2014 0910   CALCIUM 8.8 (L) 09/20/2016 1103   CALCIUM 8.6 (L) 06/02/2014 0910   PROT 7.0 09/20/2016 1103   PROT 6.3 (L) 06/02/2014 0910   ALBUMIN 3.6 09/20/2016 1103    ALBUMIN 3.4 (L) 06/02/2014 0910   AST 23 09/20/2016 1103   AST 23 06/02/2014 0910   ALT 14 09/20/2016 1103   ALT 10 (L) 06/02/2014 0910   ALKPHOS 54 09/20/2016 1103   ALKPHOS 46 06/02/2014 0910   BILITOT 0.6 09/20/2016 1103   BILITOT 0.5 06/02/2014 0910   GFRNONAA 51 (L) 09/20/2016 1103   GFRNONAA 52 (L) 06/02/2014 0910   GFRAA 59 (L) 09/20/2016 1103   GFRAA >60 06/02/2014 0910    No results found for: SPEP, UPEP  Lab Results  Component Value Date   WBC 3.9 09/20/2016   NEUTROABS 2.0 09/20/2016   HGB 11.8 (L) 09/20/2016   HCT 35.0 09/20/2016   MCV 90.3 09/20/2016   PLT 402 09/20/2016      Chemistry      Component Value Date/Time   NA 139 09/20/2016 1103   NA 138 06/02/2014 0910   K 4.2 09/20/2016 1103   K 3.7 06/02/2014 0910   CL 104 09/20/2016 1103   CL 107 06/02/2014 0910   CO2 25 09/20/2016 1103   CO2 26 06/02/2014 0910   BUN 22 (H) 09/20/2016 1103   BUN 17 06/02/2014 0910   CREATININE 0.98 09/20/2016 1103   CREATININE 1.00 06/02/2014 0910      Component Value Date/Time   CALCIUM 8.8 (L) 09/20/2016 1103   CALCIUM 8.6 (L) 06/02/2014 0910   ALKPHOS 54 09/20/2016 1103   ALKPHOS 46 06/02/2014 0910   AST 23 09/20/2016 1103   AST 23 06/02/2014 0910   ALT 14 09/20/2016 1103   ALT 10 (L) 06/02/2014 0910   BILITOT 0.6 09/20/2016 1103   BILITOT 0.5 06/02/2014 0910     IMPRESSION: Interval development of superior endplate compression deformities of the L3 and L4 vertebral bodies.  Similar appearance of the right external iliac and inguinal metastatic adenopathy.  Large hiatal hernia.  Aortic atherosclerosis.  Stable small cystic lesion within the pancreatic tail.  These results will be called to the ordering clinician or representative by the Radiologist  Assistant, and communication documented in the PACS or zVision Dashboard.   Electronically Signed   By: Lovey Newcomer M.D.   On: 07/30/2016 16:56  RADIOGRAPHIC STUDIES: I have personally  reviewed the radiological images as listed and agreed with the findings in the report. No results found.   ASSESSMENT & PLAN:  Malignant neoplasm of right fallopian tube [facility]) Metastatic fallopian tube cancer- Currently on Avastin since February 2017. June 24th CT scan- STABLE Lymphadenopathy in right groin. No clinical progression noted; except for compression fractures discussed below. Recent Ca-125-normal at 18.  # Proceed with Avastin today. Labs acceptable. UA shows no proteinuria.  # Bilateral leg swelling- no evidence of CHF/status post cardiology evaluation. Likely secondary to inguinal adenopathy/ recommend stockings leg elevation. Lasix only as needed.  # 20mm pancreatic lesion- monitor for now. CT- June 2018 shows no new progression  # Chronic UTIs- ? Dysuria-  Check UA; culture/clean catch. Continue Macrobid 100 mg once a day. We'll call if culture shows anything different.   # follow up in 3 weeks/labs/  labs Avastin infusion.    Orders Placed This Encounter  Procedures  . Urinalysis, Complete w Microscopic    Standing Status:   Future    Standing Expiration Date:   12/21/2016  . CBC with Differential/Platelet    Standing Status:   Future    Standing Expiration Date:   09/20/2017  . Comprehensive metabolic panel    Standing Status:   Future    Standing Expiration Date:   09/20/2017  . Urinalysis, Complete w Microscopic    Standing Status:   Future    Standing Expiration Date:   10/21/2016      [name], MD 09/20/2016 12:53 PM

## 2016-09-20 NOTE — Assessment & Plan Note (Addendum)
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 June 24th CT scan- STABLE Lymphadenopathy in right groin. No clinical progression noted; except for compression fractures discussed below. Recent Ca-125-normal at 18.  # Proceed with Avastin today. Labs acceptable. UA shows no proteinuria.  # Bilateral leg swelling- no evidence of CHF/status post cardiology evaluation. Likely secondary to inguinal adenopathy/ recommend stockings leg elevation. Lasix only as needed.  # 7mm pancreatic lesion- monitor for now. CT- June 2018 shows no new progression  # Chronic UTIs- ? Dysuria-  Check UA; culture/clean catch. Continue Macrobid 100 mg once a day. We'll call if culture shows anything different.   # follow up in 3 weeks/labs/  labs Avastin infusion.

## 2016-09-21 LAB — URINE CULTURE

## 2016-10-11 ENCOUNTER — Inpatient Hospital Stay: Payer: Medicare HMO

## 2016-10-11 ENCOUNTER — Ambulatory Visit
Admission: RE | Admit: 2016-10-11 | Discharge: 2016-10-11 | Disposition: A | Discharge status: Home / Self Care | Payer: Medicare HMO | Source: Ambulatory Visit | Attending: Internal Medicine | Admitting: Internal Medicine

## 2016-10-11 ENCOUNTER — Inpatient Hospital Stay: Payer: Medicare HMO | Attending: Internal Medicine | Admitting: Internal Medicine

## 2016-10-11 VITALS — BP 148/83 | HR 67 | Temp 97.8°F | Resp 20 | Ht 60.0 in | Wt 130.0 lb

## 2016-10-11 DIAGNOSIS — M7989 Other specified soft tissue disorders: Secondary | ICD-10-CM | POA: Diagnosis not present

## 2016-10-11 DIAGNOSIS — N390 Urinary tract infection, site not specified: Secondary | ICD-10-CM | POA: Diagnosis not present

## 2016-10-11 DIAGNOSIS — Z90710 Acquired absence of both cervix and uterus: Secondary | ICD-10-CM

## 2016-10-11 DIAGNOSIS — Z792 Long term (current) use of antibiotics: Secondary | ICD-10-CM | POA: Diagnosis not present

## 2016-10-11 DIAGNOSIS — N3281 Overactive bladder: Secondary | ICD-10-CM | POA: Insufficient documentation

## 2016-10-11 DIAGNOSIS — C5701 Malignant neoplasm of right fallopian tube: Secondary | ICD-10-CM

## 2016-10-11 DIAGNOSIS — Z8041 Family history of malignant neoplasm of ovary: Secondary | ICD-10-CM

## 2016-10-11 DIAGNOSIS — K219 Gastro-esophageal reflux disease without esophagitis: Secondary | ICD-10-CM | POA: Insufficient documentation

## 2016-10-11 DIAGNOSIS — C774 Secondary and unspecified malignant neoplasm of inguinal and lower limb lymph nodes: Secondary | ICD-10-CM | POA: Insufficient documentation

## 2016-10-11 DIAGNOSIS — Z5112 Encounter for antineoplastic immunotherapy: Secondary | ICD-10-CM | POA: Insufficient documentation

## 2016-10-11 DIAGNOSIS — R590 Localized enlarged lymph nodes: Secondary | ICD-10-CM | POA: Diagnosis not present

## 2016-10-11 DIAGNOSIS — Z923 Personal history of irradiation: Secondary | ICD-10-CM | POA: Insufficient documentation

## 2016-10-11 DIAGNOSIS — Z87440 Personal history of urinary (tract) infections: Secondary | ICD-10-CM | POA: Insufficient documentation

## 2016-10-11 DIAGNOSIS — I700 Atherosclerosis of aorta: Secondary | ICD-10-CM | POA: Insufficient documentation

## 2016-10-11 DIAGNOSIS — R600 Localized edema: Secondary | ICD-10-CM | POA: Insufficient documentation

## 2016-10-11 DIAGNOSIS — I341 Nonrheumatic mitral (valve) prolapse: Secondary | ICD-10-CM | POA: Diagnosis not present

## 2016-10-11 DIAGNOSIS — Z90722 Acquired absence of ovaries, bilateral: Secondary | ICD-10-CM | POA: Insufficient documentation

## 2016-10-11 DIAGNOSIS — Z79899 Other long term (current) drug therapy: Secondary | ICD-10-CM | POA: Diagnosis not present

## 2016-10-11 DIAGNOSIS — M79661 Pain in right lower leg: Secondary | ICD-10-CM | POA: Insufficient documentation

## 2016-10-11 DIAGNOSIS — Z86718 Personal history of other venous thrombosis and embolism: Secondary | ICD-10-CM | POA: Insufficient documentation

## 2016-10-11 DIAGNOSIS — Z7982 Long term (current) use of aspirin: Secondary | ICD-10-CM

## 2016-10-11 DIAGNOSIS — I252 Old myocardial infarction: Secondary | ICD-10-CM | POA: Insufficient documentation

## 2016-10-11 DIAGNOSIS — K449 Diaphragmatic hernia without obstruction or gangrene: Secondary | ICD-10-CM | POA: Insufficient documentation

## 2016-10-11 LAB — COMPREHENSIVE METABOLIC PANEL
ALBUMIN: 3.4 g/dL — AB (ref 3.5–5.0)
ALK PHOS: 53 U/L (ref 38–126)
ALT: 11 U/L — AB (ref 14–54)
ANION GAP: 8 (ref 5–15)
AST: 23 U/L (ref 15–41)
BILIRUBIN TOTAL: 0.5 mg/dL (ref 0.3–1.2)
BUN: 18 mg/dL (ref 6–20)
CALCIUM: 8.9 mg/dL (ref 8.9–10.3)
CO2: 27 mmol/L (ref 22–32)
CREATININE: 0.83 mg/dL (ref 0.44–1.00)
Chloride: 102 mmol/L (ref 101–111)
GFR calc Af Amer: >60 mL/min (ref >60)
GFR calc non Af Amer: >60 mL/min (ref >60)
Glucose, Bld: 107 mg/dL — ABNORMAL HIGH (ref 65–99)
Potassium: 3.9 mmol/L (ref 3.5–5.1)
Sodium: 137 mmol/L (ref 135–145)
TOTAL PROTEIN: 6.8 g/dL (ref 6.5–8.1)

## 2016-10-11 LAB — URINALYSIS, COMPLETE (UACMP) WITH MICROSCOPIC
BILIRUBIN URINE: NEGATIVE
Bacteria, UA: NONE SEEN
GLUCOSE, UA: NEGATIVE mg/dL
Hgb urine dipstick: NEGATIVE
KETONES UR: 5 mg/dL — AB
NITRITE: NEGATIVE
PH: 7 (ref 5.0–8.0)
Protein, ur: NEGATIVE mg/dL
SPECIFIC GRAVITY, URINE: 1.012 (ref 1.005–1.030)
Squamous Epithelial / LPF: NONE SEEN

## 2016-10-11 LAB — CBC WITH DIFFERENTIAL/PLATELET
BASOS PCT: 1 %
Basophils Absolute: 0.1 10*3/uL (ref 0–0.1)
Eosinophils Absolute: 0.1 10*3/uL (ref 0–0.7)
Eosinophils Relative: 1 %
HEMATOCRIT: 33.9 % — AB (ref 35.0–47.0)
HEMOGLOBIN: 11.4 g/dL — AB (ref 12.0–16.0)
LYMPHS ABS: 1.3 10*3/uL (ref 1.0–3.6)
Lymphocytes Relative: 17 %
MCH: 30.3 pg (ref 26.0–34.0)
MCHC: 33.6 g/dL (ref 32.0–36.0)
MCV: 90 fL (ref 80.0–100.0)
MONOS PCT: 11 %
Monocytes Absolute: 0.9 10*3/uL (ref 0.2–0.9)
NEUTROS ABS: 5.4 10*3/uL (ref 1.4–6.5)
NEUTROS PCT: 70 %
Platelets: 418 10*3/uL (ref 150–440)
RBC: 3.77 MIL/uL — ABNORMAL LOW (ref 3.80–5.20)
RDW: 15.1 % — ABNORMAL HIGH (ref 11.5–14.5)
WBC: 7.7 10*3/uL (ref 3.6–11.0)

## 2016-10-11 MED ORDER — SODIUM CHLORIDE 0.9 % IV SOLN
600.0000 mg | Freq: Once | INTRAVENOUS | Status: AC
  Administered 2016-10-11: 600 mg via INTRAVENOUS
  Filled 2016-10-11: qty 16

## 2016-10-11 MED ORDER — SODIUM CHLORIDE 0.9 % IJ SOLN
10.0000 mL | INTRAMUSCULAR | Status: DC | PRN
  Administered 2016-10-11: 10 mL
  Filled 2016-10-11: qty 10

## 2016-10-11 MED ORDER — HEPARIN SOD (PORK) LOCK FLUSH 100 UNIT/ML IV SOLN
500.0000 [IU] | Freq: Once | INTRAVENOUS | Status: DC | PRN
  Filled 2016-10-11: qty 5

## 2016-10-11 MED ORDER — HEPARIN SOD (PORK) LOCK FLUSH 100 UNIT/ML IV SOLN
500.0000 [IU] | Freq: Once | INTRAVENOUS | Status: AC
  Administered 2016-10-11: 500 [IU] via INTRAVENOUS

## 2016-10-11 MED ORDER — SODIUM CHLORIDE 0.9 % IV SOLN
Freq: Once | INTRAVENOUS | Status: AC
  Administered 2016-10-11: 13:00:00 via INTRAVENOUS
  Filled 2016-10-11: qty 1000

## 2016-10-11 NOTE — Assessment & Plan Note (Addendum)
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 June 24th CT scan- STABLE Lymphadenopathy in right groin. No clinical progression noted; except for compression fractures discussed below. May 2018 Ca-125-normal at 18. We repeat today.  # Proceed with Avastin today. Labs acceptable. UA shows no proteinuria.  # Right LE swelling- Likely secondary to inguinal adenopathy/ recommend stockings leg elevation. Lasix only as needed. Question DVT- recommend stat Dopplers.  # 19mm pancreatic lesion- monitor for now. CT- June 2018 shows no new progression  # Chronic UTIs-Continue Macrobid 100 mg once a day.   # follow up in 4 weeks [pt prefe]/labs/ ca-125 Avastin infusion.

## 2016-10-11 NOTE — Progress Notes (Signed)
[place] OFFICE PROGRESS NOTE  Patient Care Team: Ezequiel Kayser, [name]  No matching staging information was found for the patient.   Oncology History   . 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  Adequate TRS, no macroscopic residual. IP/IV chemotherapy with DDP and paclitaxel on GOG protocol, chemo and Bev consolidation completed in 03/2011 2. 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 November 14, 2011- Patient was started on carboplatin and gemcitabine 4. Finished 6 cycles of chemotherapy in April of 2014 with carboplatin and gemcitabine. Tolerance was fairly good except for neutropenia and thrombocytopenia. 5.recurrent disease by CT scan February of 2015  # .radiation therapy to pelvis  May of 2015  7.upper extremity  . and deep vein thrombosis associated with port.(June of 2015) patient started on   [place] had port removed and had   thrombectomy September 06, 2013. # CT DEC 2016- progressing disease in the right inguinal area # FEB-TAXOL-AVASTIN;  # FEB 2017- AVASTIN q 3W; July 2017- CT- Improved  Right Inguinal LN; STOP AVASTIN sec to potential concerns of AEs  # AUG 2017 3rd- START ZEJULA- declines sec to intol  # OCT 7th CT- STABLE RIGHT INGUINAL LN- RE-START AVASTIN q 3W       Malignant neoplasm of right fallopian tube ([place])   10/05/2009 Initial Diagnosis    Carcinoma of fallopian tube        INTERVAL HISTORY:  Nicole Clayton 83 y.o.  female pleasant patient above history of Fallopian tube cancer metastatic currently  Avastin is here Follow-up.  She states that she is under stress- as her husband recently admitted to nursing home.  She continues to have swelling in the legs- right more than left- Slightly worse from prior. Denies any shortness of breath or chest pain. No fevers. No unusual back pain or chills. She has not been  taking Lasix.  Denies any abdominal distention. Denies any chest pain. No nausea no vomiting. She denies any headaches. Denies any epistaxis.  REVIEW OF SYSTEMS:  A complete 10 point review of system is done which is negative except mentioned above/history of present illness.   PAST MEDICAL HISTORY :  Past Medical History:  Diagnosis Date  . Bladder infection, chronic 10/19/2011  . Cancer (HCC)    Ovarian   . Carcinoma of fallopian tube ([place]) 10/05/2009   Overview:  Overview:  Overview:  Drs. Claiborne Rigg and Delorise Shiner Choksi Overview:  Drs. Claiborne Rigg and [employer]   . Concussion   . GERD (gastroesophageal reflux disease)   . MI (mitral incompetence) 10/27/2014   Overview:  MODERATE   . Mitral valve prolapse   . OAB (overactive bladder)   . Tachycardia     PAST SURGICAL HISTORY :   Past Surgical History:  Procedure Laterality Date  . ABDOMINAL HYSTERECTOMY    . CHOLECYSTECTOMY    . LAPAROSCOPIC BILATERAL SALPINGO OOPHERECTOMY  2011  . ovarian cancer surgery  2011    FAMILY HISTORY :   Family History  Problem Relation Age of Onset  . Ovarian cancer Other   . Breast cancer Neg Hx   . Colon cancer Neg Hx   . Diabetes Neg Hx   . Heart disease Neg Hx   . Kidney disease Neg Hx   . Bladder Cancer Neg Hx     SOCIAL HISTORY:   Social History  Substance Use Topics  .  Smoking status: Never Smoker  . Smokeless tobacco: Never Used  . Alcohol use No    ALLERGIES:  is allergic to benadryl [diphenhydramine]; tamsulosin; alendronate sodium; duloxetine hcl; risedronate sodium; lovastatin; and sulfa antibiotics.  MEDICATIONS:  Current Outpatient Prescriptions  Medication Sig Dispense Refill  . Ascorbic Acid (VITAMIN C) 100 MG tablet Take 100 mg by mouth daily.    Marland Kitchen aspirin EC 81 MG tablet Take 81 mg by mouth.    . Cholecalciferol (VITAMIN D3) 2000 UNITS capsule Take 2,000 Units by mouth daily.     Marland Kitchen docusate sodium (COLACE) 100 MG capsule Take 100 mg by mouth.    .  fluticasone (FLONASE) 50 MCG/ACT nasal spray Place 1 spray into both nostrils daily.     Marland Kitchen gabapentin (NEURONTIN) 600 MG tablet TAKE (1) TABLET BY MOUTH THREE TIMES A DAY (Patient taking differently: Take 600 mg by mouth 2 (two) times daily as needed. TAKE (1) TABLET BY MOUTH THREE TIMES A DAY) 180 tablet 0  . metoprolol succinate (TOPROL-XL) 50 MG 24 hr tablet Take 50 mg by mouth daily.    . nitrofurantoin, macrocrystal-monohydrate, (MACROBID) 100 MG capsule Take 1 capsule (100 mg total) by mouth daily. 30 capsule 3  . pantoprazole (PROTONIX) 40 MG tablet Take 40 mg by mouth.    Marland Kitchen acetaminophen (TYLENOL) 500 MG tablet Take 1 tablet (500 mg total) by mouth every 6 (six) hours as needed. (Patient not taking: Reported on 10/11/2016) 30 tablet 0  . cetirizine (ZYRTEC) 10 MG tablet Take 10 mg by mouth daily.     . furosemide (LASIX) 20 MG tablet Take 1 tablet (20 mg total) by mouth daily. (Patient not taking: Reported on 09/20/2016) 30 tablet 0  . Multiple Vitamins-Minerals (CENTRUM SILVER PO) Take 1 tablet by mouth daily.     . ondansetron (ZOFRAN) 4 MG tablet TAKE (1) TABLET BY MOUTH EVERY 8 HOURS AS NEEDED FOR NAUSEA/VOMITING (Patient not taking: Reported on 10/11/2016) 30 tablet 3  . Pyridoxine HCl (VITAMIN B-6) 500 MG tablet Take 500 mg by mouth daily.    . traMADol (ULTRAM) 50 MG tablet 1 tab po q 8 hours prn (Patient not taking: Reported on 10/11/2016) 10 tablet 0  . triamcinolone ointment (KENALOG) 0.1 % Apply 1 application topically 2 (two) times daily. (Patient not taking: Reported on 10/11/2016) 30 g 1   No current facility-administered medications for this visit.    Facility-Administered Medications Ordered in Other Visits  Medication Dose Route Frequency Provider Last Rate Last Dose  . 0.9 %  sodium chloride infusion   Intravenous Once Charlaine Dalton R, MD      . bevacizumab (AVASTIN) 600 mg in sodium chloride 0.9 % 100 mL chemo infusion  600 mg Intravenous Once Charlaine Dalton R, MD       . diphenhydrAMINE (BENADRYL) injection 50 mg  50 mg Intravenous Once Choksi, Janak, MD      . sodium chloride 0.9 % injection 10 mL  10 mL Intravenous PRN Forest Gleason, MD   10 mL at 01/12/15 1116  . sodium chloride 0.9 % injection 10 mL  10 mL Intravenous PRN Forest Gleason, MD   10 mL at 02/16/15 1025    PHYSICAL EXAMINATION: ECOG PERFORMANCE STATUS: 2 - Symptomatic, <50% confined to bed  BP (!) 148/83 (BP Location: Left Arm, Patient Position: Sitting)   Pulse 67   Temp 97.8 F (36.6 C) (Tympanic)   Resp 20   Ht 5' (1.524 m)   Wt 130 lb (59  kg)   BMI 25.39 kg/m   Filed Weights   10/11/16 1157  Weight: 130 lb (59 kg)    GENERAL: Well-nourished well-developed; Alert, no distress and comfortable.  She is walking herself. Accompanied by daughter. EYES: no pallor or icterus OROPHARYNX: no thrush or ulceration; good dentition  NECK: supple, no masses felt LYMPH:  no palpable lymphadenopathy in the cervical, axillary; Right ingiunal LN ~2-3cm in size.  LUNGS: clear to auscultation and  No wheeze or crackles HEART/CVS: regular rate & rhythm and no murmurs; chronic right lower extremity swelling- worse today. Mild tenderness on exam. Pulses intact. ABDOMEN:abdomen soft, non-tender and normal bowel sounds Musculoskeletal:no cyanosis of digits and no clubbing  PSYCH: alert & oriented x 3 with fluent speech NEURO: no focal motor/sensory deficits SKIN:  no rashes or significant lesions.  LABORATORY DATA:  I have reviewed the data as listed    Component Value Date/Time   NA 137 10/11/2016 1105   NA 138 06/02/2014 0910   K 3.9 10/11/2016 1105   K 3.7 06/02/2014 0910   CL 102 10/11/2016 1105   CL 107 06/02/2014 0910   CO2 27 10/11/2016 1105   CO2 26 06/02/2014 0910   GLUCOSE 107 (H) 10/11/2016 1105   GLUCOSE 106 (H) 06/02/2014 0910   BUN 18 10/11/2016 1105   BUN 17 06/02/2014 0910   CREATININE 0.83 10/11/2016 1105   CREATININE 1.00 06/02/2014 0910   CALCIUM 8.9 10/11/2016  1105   CALCIUM 8.6 (L) 06/02/2014 0910   PROT 6.8 10/11/2016 1105   PROT 6.3 (L) 06/02/2014 0910   ALBUMIN 3.4 (L) 10/11/2016 1105   ALBUMIN 3.4 (L) 06/02/2014 0910   AST 23 10/11/2016 1105   AST 23 06/02/2014 0910   ALT 11 (L) 10/11/2016 1105   ALT 10 (L) 06/02/2014 0910   ALKPHOS 53 10/11/2016 1105   ALKPHOS 46 06/02/2014 0910   BILITOT 0.5 10/11/2016 1105   BILITOT 0.5 06/02/2014 0910   GFRNONAA >60 10/11/2016 1105   GFRNONAA 52 (L) 06/02/2014 0910   GFRAA >60 10/11/2016 1105   GFRAA >60 06/02/2014 0910    No results found for: SPEP, UPEP  Lab Results  Component Value Date   WBC 7.7 10/11/2016   NEUTROABS 5.4 10/11/2016   HGB 11.4 (L) 10/11/2016   HCT 33.9 (L) 10/11/2016   MCV 90.0 10/11/2016   PLT 418 10/11/2016      Chemistry      Component Value Date/Time   NA 137 10/11/2016 1105   NA 138 06/02/2014 0910   K 3.9 10/11/2016 1105   K 3.7 06/02/2014 0910   CL 102 10/11/2016 1105   CL 107 06/02/2014 0910   CO2 27 10/11/2016 1105   CO2 26 06/02/2014 0910   BUN 18 10/11/2016 1105   BUN 17 06/02/2014 0910   CREATININE 0.83 10/11/2016 1105   CREATININE 1.00 06/02/2014 0910      Component Value Date/Time   CALCIUM 8.9 10/11/2016 1105   CALCIUM 8.6 (L) 06/02/2014 0910   ALKPHOS 53 10/11/2016 1105   ALKPHOS 46 06/02/2014 0910   AST 23 10/11/2016 1105   AST 23 06/02/2014 0910   ALT 11 (L) 10/11/2016 1105   ALT 10 (L) 06/02/2014 0910   BILITOT 0.5 10/11/2016 1105   BILITOT 0.5 06/02/2014 0910     IMPRESSION: Interval development of superior endplate compression deformities of the L3 and L4 vertebral bodies.  Similar appearance of the right external iliac and inguinal metastatic adenopathy.  Large hiatal hernia.  Aortic atherosclerosis.  Stable small cystic lesion within the pancreatic tail.  These results will be called to the ordering clinician or representative by the Radiologist Assistant, and communication documented in the PACS or zVision  Dashboard.   Electronically Signed   By: Lovey Newcomer M.D.   On: 07/30/2016 16:56  RADIOGRAPHIC STUDIES: I have personally reviewed the radiological images as listed and agreed with the findings in the report. No results found.   ASSESSMENT & PLAN:  Malignant neoplasm of right fallopian tube [facility]) Metastatic fallopian tube cancer- Currently on Avastin since February 2017. June 24th CT scan- STABLE Lymphadenopathy in right groin. No clinical progression noted; except for compression fractures discussed below. May 2018 Ca-125-normal at 18. We repeat today.  # Proceed with Avastin today. Labs acceptable. UA shows no proteinuria.  # Right LE swelling- Likely secondary to inguinal adenopathy/ recommend stockings leg elevation. Lasix only as needed. Question DVT- recommend stat Dopplers.  # 6mm pancreatic lesion- monitor for now. CT- June 2018 shows no new progression  # Chronic UTIs-Continue Macrobid 100 mg once a day.   # follow up in 4 weeks [pt prefe]/labs/ ca-125 Avastin infusion.    Orders Placed This Encounter  Procedures  . US Venous Img Lower Unilateral Right    Standing Status:   Future    Standing Expiration Date:   10/11/2017    Order Specific Question:   Reason for Exam (SYMPTOM  OR DIAGNOSIS REQUIRED)    Answer:   right leg swelling    Order Specific Question:   Preferred imaging location?    Answer:   [facility]    Order Specific Question:   Call Results- Best Contact Number?    Answer:   [phone], hold pt  . CA [phone]    Standing Status:   Future    Number of Occurrences:   1    Standing Expiration Date:   10/18/2016      [name], MD 10/11/2016 1:11 PM

## 2016-10-12 LAB — CA 125: Cancer Antigen (CA) 125: 26.2 U/mL (ref 0.0–38.1)

## 2016-10-22 ENCOUNTER — Ambulatory Visit (INDEPENDENT_AMBULATORY_CARE_PROVIDER_SITE_OTHER): Payer: Medicare HMO | Admitting: Obstetrics and Gynecology

## 2016-10-22 ENCOUNTER — Encounter: Payer: Self-pay | Admitting: Obstetrics and Gynecology

## 2016-10-22 VITALS — BP 135/81 | HR 67 | Ht 60.0 in | Wt 130.6 lb

## 2016-10-22 DIAGNOSIS — N952 Postmenopausal atrophic vaginitis: Secondary | ICD-10-CM

## 2016-10-22 DIAGNOSIS — Z4689 Encounter for fitting and adjustment of other specified devices: Secondary | ICD-10-CM

## 2016-10-22 DIAGNOSIS — C5700 Malignant neoplasm of unspecified fallopian tube: Secondary | ICD-10-CM

## 2016-10-22 DIAGNOSIS — R339 Retention of urine, unspecified: Secondary | ICD-10-CM

## 2016-10-22 DIAGNOSIS — N8111 Cystocele, midline: Secondary | ICD-10-CM | POA: Diagnosis not present

## 2016-10-25 NOTE — Patient Instructions (Signed)
1. Return in 3 months for pessary maintenance

## 2016-10-25 NOTE — Progress Notes (Signed)
Chief complaint: 1. Pessary maintenance 2. History of cystocele, grade 2 3. History of multidrug resistant UTI #4. 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 She is clinically doing well from a vaginal standpoint having no bleeding, vaginal discharge, vaginal odor or vaginal pain. The ring with support pessary is working well. No recent UTI.  She continues to get avastin in for fallopian tube cancer.  Past medical history, past surgical history, problem list, medications, and allergies are reviewed  OBJECTIVE: BP 135/81   Pulse 67   Ht 5' (1.524 m)   Wt 130 lb 9.6 oz (59.2 kg)   BMI 25.51 kg/m  Pleasant elderly female in no acute distress. She is alert and oriented. Abdomen Abdomen: Soft, nontender without organomegaly Pelvic exam: External genitalia: Atrophic changes; the 3-4 lymph nodes are still palpable in her right inguinal region and are essentially unchanged (2 x 3 cm matted nodes) BUS-urethral caruncle Vagina-moderate atrophy; vaginal cuff intact; no significant vaginal discharge or ulceration Cervix-surgically absent Uterus-surgically absent Bimanual-palpable masses or tenderness rectovaginal-normal external exam Bladder-nontender  ASSESSMENT: 1. Cystocele, grade 2, with history of incomplete bladder emptying 2. Normal pessary maintenance 3. 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 Ring with support pessary is removed, cleaned, and reinserted 2.  Return in 3 months for follow-up, or sooner if vaginal discharge, vaginal bleeding, or pelvic pain developed 3. No intravaginal medications are being used  A total of 15 minutes were spent face-to-face with the patient during this encounter and over half of that time dealt with counseling and coordination of care.   Brayton Mars, MD  Note: This dictation was  prepared with Dragon dictation along with smaller phrase technology. Any transcriptional errors that result from this process are unintentional.

## 2016-11-08 ENCOUNTER — Inpatient Hospital Stay: Payer: Medicare HMO

## 2016-11-08 ENCOUNTER — Inpatient Hospital Stay: Payer: Medicare HMO | Attending: Internal Medicine | Admitting: Internal Medicine

## 2016-11-08 VITALS — BP 130/81 | HR 60 | Temp 97.8°F | Resp 20 | Ht 60.0 in | Wt 136.0 lb

## 2016-11-08 DIAGNOSIS — C5701 Malignant neoplasm of right fallopian tube: Secondary | ICD-10-CM

## 2016-11-08 DIAGNOSIS — I252 Old myocardial infarction: Secondary | ICD-10-CM | POA: Diagnosis not present

## 2016-11-08 DIAGNOSIS — Z9181 History of falling: Secondary | ICD-10-CM | POA: Diagnosis not present

## 2016-11-08 DIAGNOSIS — Z90722 Acquired absence of ovaries, bilateral: Secondary | ICD-10-CM | POA: Diagnosis not present

## 2016-11-08 DIAGNOSIS — Z923 Personal history of irradiation: Secondary | ICD-10-CM | POA: Diagnosis not present

## 2016-11-08 DIAGNOSIS — Z86718 Personal history of other venous thrombosis and embolism: Secondary | ICD-10-CM | POA: Diagnosis not present

## 2016-11-08 DIAGNOSIS — Z79899 Other long term (current) drug therapy: Secondary | ICD-10-CM | POA: Insufficient documentation

## 2016-11-08 DIAGNOSIS — I341 Nonrheumatic mitral (valve) prolapse: Secondary | ICD-10-CM | POA: Diagnosis not present

## 2016-11-08 DIAGNOSIS — Z9119 Patient's noncompliance with other medical treatment and regimen: Secondary | ICD-10-CM | POA: Insufficient documentation

## 2016-11-08 DIAGNOSIS — C774 Secondary and unspecified malignant neoplasm of inguinal and lower limb lymph nodes: Secondary | ICD-10-CM | POA: Diagnosis not present

## 2016-11-08 DIAGNOSIS — I509 Heart failure, unspecified: Secondary | ICD-10-CM | POA: Insufficient documentation

## 2016-11-08 DIAGNOSIS — Z7982 Long term (current) use of aspirin: Secondary | ICD-10-CM | POA: Insufficient documentation

## 2016-11-08 DIAGNOSIS — Z90710 Acquired absence of both cervix and uterus: Secondary | ICD-10-CM | POA: Insufficient documentation

## 2016-11-08 DIAGNOSIS — Z792 Long term (current) use of antibiotics: Secondary | ICD-10-CM | POA: Diagnosis not present

## 2016-11-08 DIAGNOSIS — K219 Gastro-esophageal reflux disease without esophagitis: Secondary | ICD-10-CM | POA: Diagnosis not present

## 2016-11-08 DIAGNOSIS — Z87440 Personal history of urinary (tract) infections: Secondary | ICD-10-CM | POA: Diagnosis not present

## 2016-11-08 DIAGNOSIS — M7989 Other specified soft tissue disorders: Secondary | ICD-10-CM | POA: Diagnosis not present

## 2016-11-08 DIAGNOSIS — N3281 Overactive bladder: Secondary | ICD-10-CM | POA: Diagnosis not present

## 2016-11-08 DIAGNOSIS — Z5112 Encounter for antineoplastic immunotherapy: Secondary | ICD-10-CM | POA: Insufficient documentation

## 2016-11-08 DIAGNOSIS — R590 Localized enlarged lymph nodes: Secondary | ICD-10-CM | POA: Diagnosis not present

## 2016-11-08 DIAGNOSIS — Z9221 Personal history of antineoplastic chemotherapy: Secondary | ICD-10-CM | POA: Insufficient documentation

## 2016-11-08 DIAGNOSIS — Z7901 Long term (current) use of anticoagulants: Secondary | ICD-10-CM | POA: Insufficient documentation

## 2016-11-08 LAB — CBC WITH DIFFERENTIAL/PLATELET
BASOS ABS: 0 10*3/uL (ref 0–0.1)
Basophils Relative: 1 %
EOS PCT: 2 %
Eosinophils Absolute: 0.1 10*3/uL (ref 0–0.7)
HCT: 33.2 % — ABNORMAL LOW (ref 35.0–47.0)
HEMOGLOBIN: 11.1 g/dL — AB (ref 12.0–16.0)
LYMPHS PCT: 28 %
Lymphs Abs: 1.1 10*3/uL (ref 1.0–3.6)
MCH: 30.2 pg (ref 26.0–34.0)
MCHC: 33.5 g/dL (ref 32.0–36.0)
MCV: 90.2 fL (ref 80.0–100.0)
Monocytes Absolute: 0.8 10*3/uL (ref 0.2–0.9)
Monocytes Relative: 19 %
NEUTROS ABS: 2.1 10*3/uL (ref 1.4–6.5)
NEUTROS PCT: 50 %
PLATELETS: 375 10*3/uL (ref 150–440)
RBC: 3.68 MIL/uL — AB (ref 3.80–5.20)
RDW: 15.3 % — ABNORMAL HIGH (ref 11.5–14.5)
WBC: 4.1 10*3/uL (ref 3.6–11.0)

## 2016-11-08 LAB — URINALYSIS, COMPLETE (UACMP) WITH MICROSCOPIC
Bacteria, UA: NONE SEEN
Bilirubin Urine: NEGATIVE
Glucose, UA: NEGATIVE mg/dL
HGB URINE DIPSTICK: NEGATIVE
Ketones, ur: NEGATIVE mg/dL
Nitrite: NEGATIVE
PH: 6 (ref 5.0–8.0)
PROTEIN: NEGATIVE mg/dL
Specific Gravity, Urine: 1.008 (ref 1.005–1.030)

## 2016-11-08 LAB — COMPREHENSIVE METABOLIC PANEL
ALT: 13 U/L — AB (ref 14–54)
ANION GAP: 5 (ref 5–15)
AST: 24 U/L (ref 15–41)
Albumin: 3.4 g/dL — ABNORMAL LOW (ref 3.5–5.0)
Alkaline Phosphatase: 53 U/L (ref 38–126)
BILIRUBIN TOTAL: 0.5 mg/dL (ref 0.3–1.2)
BUN: 19 mg/dL (ref 6–20)
CO2: 28 mmol/L (ref 22–32)
Calcium: 8.5 mg/dL — ABNORMAL LOW (ref 8.9–10.3)
Chloride: 105 mmol/L (ref 101–111)
Creatinine, Ser: 0.72 mg/dL (ref 0.44–1.00)
GFR calc Af Amer: >60 mL/min (ref >60)
GFR calc non Af Amer: >60 mL/min (ref >60)
GLUCOSE: 93 mg/dL (ref 65–99)
POTASSIUM: 4 mmol/L (ref 3.5–5.1)
Sodium: 138 mmol/L (ref 135–145)
TOTAL PROTEIN: 6.7 g/dL (ref 6.5–8.1)

## 2016-11-08 MED ORDER — HEPARIN SOD (PORK) LOCK FLUSH 100 UNIT/ML IV SOLN
500.0000 [IU] | Freq: Once | INTRAVENOUS | Status: DC | PRN
  Filled 2016-11-08: qty 5

## 2016-11-08 MED ORDER — HEPARIN SOD (PORK) LOCK FLUSH 100 UNIT/ML IV SOLN
INTRAVENOUS | Status: AC
  Filled 2016-11-08: qty 5

## 2016-11-08 MED ORDER — SODIUM CHLORIDE 0.9 % IV SOLN
600.0000 mg | Freq: Once | INTRAVENOUS | Status: AC
  Administered 2016-11-08: 600 mg via INTRAVENOUS
  Filled 2016-11-08: qty 16

## 2016-11-08 MED ORDER — SODIUM CHLORIDE 0.9 % IV SOLN
Freq: Once | INTRAVENOUS | Status: AC
  Administered 2016-11-08: 13:00:00 via INTRAVENOUS
  Filled 2016-11-08: qty 1000

## 2016-11-08 NOTE — Assessment & Plan Note (Addendum)
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 June 24th CT scan- STABLE Lymphadenopathy in right groin. No clinical progression noted.  # Proceed with Avastin today. Labs acceptable. UA shows no proteinuria.  # Right LE swelling- Likely secondary to inguinal adenopathy/ recommend stockings leg elevation. Lasix only as needed. August 2018-venous Dopplers negative; showed approximately 6 cm inguinal lymph node.   # 65mm pancreatic lesion- monitor for now. CT- June 2018 shows no new progression. Await  CT scan.  # Chronic UTIs-Continue Macrobid 100 mg once a day. New refill given.   # Congestive heart failure-compensated/ patient questions regarding from her insurance "about inhibitor". Defer to cardiology.  # follow up in 3 weeks ]/labs/ ca-125 Avastin infusion. CT scan prior  CC: Dr.Kowalski

## 2016-11-08 NOTE — Addendum Note (Signed)
Addended by: Sandria Bales B on: 11/08/2016 04:05 PM   Modules accepted: Orders

## 2016-11-08 NOTE — Progress Notes (Signed)
[facility] OFFICE PROGRESS NOTE  Patient Care Team: Ezequiel Kayser, [name]  No matching staging information was found for the patient.   Oncology History   . 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  Adequate TRS, no macroscopic residual. IP/IV chemotherapy with DDP and paclitaxel on GOG protocol, chemo and Bev consolidation completed in 03/2011 2. 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 November 14, 2011- Patient was started on carboplatin and gemcitabine 4. Finished 6 cycles of chemotherapy in April of 2014 with carboplatin and gemcitabine. Tolerance was fairly good except for neutropenia and thrombocytopenia. 5.recurrent disease by CT scan February of 2015  # .radiation therapy to pelvis  May of 2015  7.upper extremity  . and deep vein thrombosis associated with port.(June of 2015) patient started on   [place] had port removed and had   thrombectomy September 06, 2013. # CT DEC 2016- progressing disease in the right inguinal area # FEB-TAXOL-AVASTIN;  # FEB 2017- AVASTIN q 3W; July 2017- CT- Improved  Right Inguinal LN; STOP AVASTIN sec to potential concerns of AEs  # AUG 2017 3rd- START ZEJULA- declines sec to intol  # OCT 7th CT- STABLE RIGHT INGUINAL LN- RE-START AVASTIN q 3W       Malignant neoplasm of right fallopian tube ([place])   10/05/2009 Initial Diagnosis    Carcinoma of fallopian tube        INTERVAL HISTORY:  Nicole Clayton 83 y.o.  female pleasant patient above history of Fallopian tube cancer metastatic currently  Avastin is here Follow-up.  Patient states that she presented trip and fell in the garden. She is try to clean the windows.  She continues to have swelling in the legs right more than left. Not any worse. She has not been compliant with stockings. Denies any shortness of breath or chest pain. No fevers. No unusual back  pain or chills.  Denies any abdominal distention. Denies any chest pain. No nausea no vomiting. She denies any headaches. Denies any epistaxis.  REVIEW OF SYSTEMS:  A complete 10 point review of system is done which is negative except mentioned above/history of present illness.   PAST MEDICAL HISTORY :  Past Medical History:  Diagnosis Date  . Bladder infection, chronic 10/19/2011  . Cancer (HCC)    Ovarian   . Carcinoma of fallopian tube ([place]) 10/05/2009   Overview:  Overview:  Overview:  Drs. Claiborne Rigg and Delorise Shiner Choksi Overview:  Drs. Claiborne Rigg and [employer]   . Concussion   . GERD (gastroesophageal reflux disease)   . MI (mitral incompetence) 10/27/2014   Overview:  MODERATE   . Mitral valve prolapse   . OAB (overactive bladder)   . Tachycardia     PAST SURGICAL HISTORY :   Past Surgical History:  Procedure Laterality Date  . ABDOMINAL HYSTERECTOMY    . CHOLECYSTECTOMY    . LAPAROSCOPIC BILATERAL SALPINGO OOPHERECTOMY  2011  . ovarian cancer surgery  2011    FAMILY HISTORY :   Family History  Problem Relation Age of Onset  . Ovarian cancer Other   . Breast cancer Neg Hx   . Colon cancer Neg Hx   . Diabetes Neg Hx   . Heart disease Neg Hx   . Kidney disease Neg Hx   . Bladder Cancer Neg Hx     SOCIAL HISTORY:   Social History  Substance  Use Topics  . Smoking status: Never Smoker  . Smokeless tobacco: Never Used  . Alcohol use No    ALLERGIES:  is allergic to benadryl [diphenhydramine]; tamsulosin; alendronate sodium; duloxetine hcl; risedronate sodium; lovastatin; and sulfa antibiotics.  MEDICATIONS:  Current Outpatient Prescriptions  Medication Sig Dispense Refill  . Ascorbic Acid (VITAMIN C) 100 MG tablet Take 100 mg by mouth daily.    Marland Kitchen aspirin EC 81 MG tablet Take 81 mg by mouth.    . cetirizine (ZYRTEC) 10 MG tablet Take 10 mg by mouth daily.     . Cholecalciferol (VITAMIN D3) 2000 UNITS capsule Take 2,000 Units by mouth daily.     Marland Kitchen docusate  sodium (COLACE) 100 MG capsule Take 100 mg by mouth.    . fluticasone (FLONASE) 50 MCG/ACT nasal spray Place 1 spray into both nostrils daily.     Marland Kitchen gabapentin (NEURONTIN) 600 MG tablet TAKE (1) TABLET BY MOUTH THREE TIMES A DAY (Patient taking differently: Take 600 mg by mouth 2 (two) times daily as needed. TAKE (1) TABLET BY MOUTH THREE TIMES A DAY) 180 tablet 0  . naproxen sodium (ANAPROX) 220 MG tablet Take 220 mg by mouth 2 (two) times daily with a meal.    . nitrofurantoin, macrocrystal-monohydrate, (MACROBID) 100 MG capsule Take 1 capsule (100 mg total) by mouth daily. 30 capsule 3  . pantoprazole (PROTONIX) 40 MG tablet Take 40 mg by mouth.    Marland Kitchen acetaminophen (TYLENOL) 650 MG CR tablet Take 650 mg by mouth every 8 (eight) hours as needed for pain.    Marland Kitchen ondansetron (ZOFRAN) 4 MG tablet Take 4 mg by mouth every 8 (eight) hours as needed for nausea or vomiting.    . potassium chloride (K-DUR,KLOR-CON) 10 MEQ tablet Take 10 mEq by mouth once.     . Pyridoxine HCl (VITAMIN B-6) 500 MG tablet Take 500 mg by mouth daily.     No current facility-administered medications for this visit.    Facility-Administered Medications Ordered in Other Visits  Medication Dose Route Frequency Provider Last Rate Last Dose  . bevacizumab (AVASTIN) 600 mg in sodium chloride 0.9 % 100 mL chemo infusion  600 mg Intravenous Once Charlaine Dalton R, MD 372 mL/hr at 11/08/16 1300 600 mg at 11/08/16 1300  . diphenhydrAMINE (BENADRYL) injection 50 mg  50 mg Intravenous Once Choksi, Janak, MD      . sodium chloride 0.9 % injection 10 mL  10 mL Intravenous PRN Forest Gleason, MD   10 mL at 01/12/15 1116  . sodium chloride 0.9 % injection 10 mL  10 mL Intravenous PRN Forest Gleason, MD   10 mL at 02/16/15 1025    PHYSICAL EXAMINATION: ECOG PERFORMANCE STATUS: 2 - Symptomatic, <50% confined to bed  BP 130/81 (BP Location: Left Arm)   Pulse 60   Temp 97.8 F (36.6 C) (Tympanic)   Resp 20   Ht 5' (1.524 m)   Wt 136  lb (61.7 kg)   BMI 26.56 kg/m   Filed Weights   11/08/16 1140  Weight: 136 lb (61.7 kg)    GENERAL: Well-nourished well-developed; Alert, no distress and comfortable.  She is walking herself. Accompanied by daughter-in-law.  EYES: no pallor or icterus OROPHARYNX: no thrush or ulceration; good dentition  NECK: supple, no masses felt LYMPH:  no palpable lymphadenopathy in the cervical, axillary; Right ingiunal LN ~2-3cm in size.  LUNGS: clear to auscultation and  No wheeze or crackles HEART/CVS: regular rate & rhythm and no murmurs;  chronic right lower extremity swelling- worse today. Mild tenderness on exam. Pulses intact. ABDOMEN:abdomen soft, non-tender and normal bowel sounds Musculoskeletal:no cyanosis of digits and no clubbing  PSYCH: alert & oriented x 3 with fluent speech NEURO: no focal motor/sensory deficits SKIN:  no rashes or significant lesions.  LABORATORY DATA:  I have reviewed the data as listed    Component Value Date/Time   NA 138 11/08/2016 1047   NA 138 06/02/2014 0910   K 4.0 11/08/2016 1047   K 3.7 06/02/2014 0910   CL 105 11/08/2016 1047   CL 107 06/02/2014 0910   CO2 28 11/08/2016 1047   CO2 26 06/02/2014 0910   GLUCOSE 93 11/08/2016 1047   GLUCOSE 106 (H) 06/02/2014 0910   BUN 19 11/08/2016 1047   BUN 17 06/02/2014 0910   CREATININE 0.72 11/08/2016 1047   CREATININE 1.00 06/02/2014 0910   CALCIUM 8.5 (L) 11/08/2016 1047   CALCIUM 8.6 (L) 06/02/2014 0910   PROT 6.7 11/08/2016 1047   PROT 6.3 (L) 06/02/2014 0910   ALBUMIN 3.4 (L) 11/08/2016 1047   ALBUMIN 3.4 (L) 06/02/2014 0910   AST 24 11/08/2016 1047   AST 23 06/02/2014 0910   ALT 13 (L) 11/08/2016 1047   ALT 10 (L) 06/02/2014 0910   ALKPHOS 53 11/08/2016 1047   ALKPHOS 46 06/02/2014 0910   BILITOT 0.5 11/08/2016 1047   BILITOT 0.5 06/02/2014 0910   GFRNONAA >60 11/08/2016 1047   GFRNONAA 52 (L) 06/02/2014 0910   GFRAA >60 11/08/2016 1047   GFRAA >60 06/02/2014 0910    No results  found for: SPEP, UPEP  Lab Results  Component Value Date   WBC 4.1 11/08/2016   NEUTROABS 2.1 11/08/2016   HGB 11.1 (L) 11/08/2016   HCT 33.2 (L) 11/08/2016   MCV 90.2 11/08/2016   PLT 375 11/08/2016      Chemistry      Component Value Date/Time   NA 138 11/08/2016 1047   NA 138 06/02/2014 0910   K 4.0 11/08/2016 1047   K 3.7 06/02/2014 0910   CL 105 11/08/2016 1047   CL 107 06/02/2014 0910   CO2 28 11/08/2016 1047   CO2 26 06/02/2014 0910   BUN 19 11/08/2016 1047   BUN 17 06/02/2014 0910   CREATININE 0.72 11/08/2016 1047   CREATININE 1.00 06/02/2014 0910      Component Value Date/Time   CALCIUM 8.5 (L) 11/08/2016 1047   CALCIUM 8.6 (L) 06/02/2014 0910   ALKPHOS 53 11/08/2016 1047   ALKPHOS 46 06/02/2014 0910   AST 24 11/08/2016 1047   AST 23 06/02/2014 0910   ALT 13 (L) 11/08/2016 1047   ALT 10 (L) 06/02/2014 0910   BILITOT 0.5 11/08/2016 1047   BILITOT 0.5 06/02/2014 0910     IMPRESSION: Interval development of superior endplate compression deformities of the L3 and L4 vertebral bodies.  Similar appearance of the right external iliac and inguinal metastatic adenopathy.  Large hiatal hernia.  Aortic atherosclerosis.  Stable small cystic lesion within the pancreatic tail.  These results will be called to the ordering clinician or representative by the Radiologist Assistant, and communication documented in the PACS or zVision Dashboard.   Electronically Signed   By: Lovey Newcomer M.D.   On: 07/30/2016 16:56  RADIOGRAPHIC STUDIES: I have personally reviewed the radiological images as listed and agreed with the findings in the report. No results found.   ASSESSMENT & PLAN:  Malignant neoplasm of right fallopian tube [facility]) Metastatic fallopian  tube cancer- Currently on Avastin since February 2017. June 24th CT scan- STABLE Lymphadenopathy in right groin. No clinical progression noted.  # Proceed with Avastin today. Labs acceptable. UA shows no  proteinuria.  # Right LE swelling- Likely secondary to inguinal adenopathy/ recommend stockings leg elevation. Lasix only as needed. August 2018-venous Dopplers negative; showed approximately 6 cm inguinal lymph node.   # 65mm pancreatic lesion- monitor for now. CT- June 2018 shows no new progression. Await  CT scan.  # Chronic UTIs-Continue Macrobid 100 mg once a day. New refill given.   # Congestive heart failure-compensated/ patient questions regarding from her insurance "about inhibitor". Defer to cardiology.  # follow up in 3 weeks ]/labs/ ca-125 Avastin infusion. CT scan prior  CC: Dr.Kowalski   Orders Placed This Encounter  Procedures  . CT ABDOMEN PELVIS W CONTRAST    Standing Status:   Future    Standing Expiration Date:   11/08/2017    Order Specific Question:   If indicated for the ordered procedure, I authorize the administration of contrast media per Radiology protocol    Answer:   Yes    Order Specific Question:   Preferred imaging location?    Answer:   [facility]    Order Specific Question:   Radiology Contrast Protocol - do NOT remove file path    Answer:   [email or web address]    Order Specific Question:   Reason for Exam additional comments    Answer:   ovarian cancer; leg swelling  . CBC with Differential/Platelet    Standing Status:   Future    Number of Occurrences:   1    Standing Expiration Date:   11/08/2017  . Comprehensive metabolic panel    Standing Status:   Future    Number of Occurrences:   1    Standing Expiration Date:   11/08/2017  . CA 125    Standing Status:   Future    Number of Occurrences:   1    Standing Expiration Date:   11/08/2017  . Urinalysis, Complete w Microscopic    Standing Status:   Future    Number of Occurrences:   1    Standing Expiration Date:   02/08/2017      [name], MD 11/08/2016 1:04 PM

## 2016-11-09 LAB — CA 125: CANCER ANTIGEN (CA) 125: 21.7 U/mL (ref 0.0–38.1)

## 2016-11-12 DIAGNOSIS — H524 Presbyopia: Secondary | ICD-10-CM | POA: Diagnosis not present

## 2016-11-12 DIAGNOSIS — H538 Other visual disturbances: Secondary | ICD-10-CM | POA: Diagnosis not present

## 2016-11-21 DIAGNOSIS — I10 Essential (primary) hypertension: Secondary | ICD-10-CM | POA: Diagnosis not present

## 2016-11-21 DIAGNOSIS — Z23 Encounter for immunization: Secondary | ICD-10-CM | POA: Diagnosis not present

## 2016-11-21 DIAGNOSIS — C5701 Malignant neoplasm of right fallopian tube: Secondary | ICD-10-CM | POA: Diagnosis not present

## 2016-11-21 DIAGNOSIS — E559 Vitamin D deficiency, unspecified: Secondary | ICD-10-CM | POA: Diagnosis not present

## 2016-11-21 DIAGNOSIS — G620 Drug-induced polyneuropathy: Secondary | ICD-10-CM | POA: Diagnosis not present

## 2016-11-21 DIAGNOSIS — E538 Deficiency of other specified B group vitamins: Secondary | ICD-10-CM | POA: Diagnosis not present

## 2016-11-21 DIAGNOSIS — E119 Type 2 diabetes mellitus without complications: Secondary | ICD-10-CM | POA: Diagnosis not present

## 2016-11-21 DIAGNOSIS — I471 Supraventricular tachycardia: Secondary | ICD-10-CM | POA: Diagnosis not present

## 2016-11-21 DIAGNOSIS — G44309 Post-traumatic headache, unspecified, not intractable: Secondary | ICD-10-CM | POA: Diagnosis not present

## 2016-11-21 DIAGNOSIS — M8000XS Age-related osteoporosis with current pathological fracture, unspecified site, sequela: Secondary | ICD-10-CM | POA: Diagnosis not present

## 2016-11-27 ENCOUNTER — Ambulatory Visit
Admission: RE | Admit: 2016-11-27 | Discharge: 2016-11-27 | Disposition: A | Discharge status: Home / Self Care | Payer: Medicare HMO | Source: Ambulatory Visit | Attending: Internal Medicine | Admitting: Internal Medicine

## 2016-11-27 DIAGNOSIS — Z08 Encounter for follow-up examination after completed treatment for malignant neoplasm: Secondary | ICD-10-CM | POA: Insufficient documentation

## 2016-11-27 DIAGNOSIS — Z8589 Personal history of malignant neoplasm of other organs and systems: Secondary | ICD-10-CM | POA: Insufficient documentation

## 2016-11-27 DIAGNOSIS — C5701 Malignant neoplasm of right fallopian tube: Secondary | ICD-10-CM

## 2016-11-27 DIAGNOSIS — Z90710 Acquired absence of both cervix and uterus: Secondary | ICD-10-CM | POA: Insufficient documentation

## 2016-11-27 DIAGNOSIS — Z8543 Personal history of malignant neoplasm of ovary: Secondary | ICD-10-CM | POA: Insufficient documentation

## 2016-11-27 DIAGNOSIS — R600 Localized edema: Secondary | ICD-10-CM | POA: Diagnosis present

## 2016-11-27 MED ORDER — IOPAMIDOL (ISOVUE-300) INJECTION 61%
100.0000 mL | Freq: Once | INTRAVENOUS | Status: AC | PRN
  Administered 2016-11-27: 100 mL via INTRAVENOUS

## 2016-11-28 ENCOUNTER — Inpatient Hospital Stay (HOSPITAL_BASED_OUTPATIENT_CLINIC_OR_DEPARTMENT_OTHER): Payer: Medicare HMO | Admitting: Internal Medicine

## 2016-11-28 ENCOUNTER — Inpatient Hospital Stay: Payer: Medicare HMO

## 2016-11-28 ENCOUNTER — Other Ambulatory Visit: Payer: Self-pay | Admitting: Internal Medicine

## 2016-11-28 VITALS — BP 120/76 | HR 69 | Temp 98.6°F | Resp 20 | Wt 128.4 lb

## 2016-11-28 DIAGNOSIS — Z90710 Acquired absence of both cervix and uterus: Secondary | ICD-10-CM | POA: Diagnosis not present

## 2016-11-28 DIAGNOSIS — Z9181 History of falling: Secondary | ICD-10-CM

## 2016-11-28 DIAGNOSIS — R590 Localized enlarged lymph nodes: Secondary | ICD-10-CM | POA: Diagnosis not present

## 2016-11-28 DIAGNOSIS — Z90722 Acquired absence of ovaries, bilateral: Secondary | ICD-10-CM | POA: Diagnosis not present

## 2016-11-28 DIAGNOSIS — M7989 Other specified soft tissue disorders: Secondary | ICD-10-CM | POA: Diagnosis not present

## 2016-11-28 DIAGNOSIS — Z9221 Personal history of antineoplastic chemotherapy: Secondary | ICD-10-CM | POA: Diagnosis not present

## 2016-11-28 DIAGNOSIS — I509 Heart failure, unspecified: Secondary | ICD-10-CM

## 2016-11-28 DIAGNOSIS — I252 Old myocardial infarction: Secondary | ICD-10-CM

## 2016-11-28 DIAGNOSIS — K219 Gastro-esophageal reflux disease without esophagitis: Secondary | ICD-10-CM

## 2016-11-28 DIAGNOSIS — Z923 Personal history of irradiation: Secondary | ICD-10-CM | POA: Diagnosis not present

## 2016-11-28 DIAGNOSIS — Z87440 Personal history of urinary (tract) infections: Secondary | ICD-10-CM

## 2016-11-28 DIAGNOSIS — C774 Secondary and unspecified malignant neoplasm of inguinal and lower limb lymph nodes: Secondary | ICD-10-CM | POA: Diagnosis not present

## 2016-11-28 DIAGNOSIS — C5701 Malignant neoplasm of right fallopian tube: Secondary | ICD-10-CM | POA: Diagnosis not present

## 2016-11-28 DIAGNOSIS — Z7982 Long term (current) use of aspirin: Secondary | ICD-10-CM

## 2016-11-28 DIAGNOSIS — I341 Nonrheumatic mitral (valve) prolapse: Secondary | ICD-10-CM

## 2016-11-28 DIAGNOSIS — Z7901 Long term (current) use of anticoagulants: Secondary | ICD-10-CM

## 2016-11-28 DIAGNOSIS — Z79899 Other long term (current) drug therapy: Secondary | ICD-10-CM

## 2016-11-28 DIAGNOSIS — Z9119 Patient's noncompliance with other medical treatment and regimen: Secondary | ICD-10-CM

## 2016-11-28 DIAGNOSIS — Z5112 Encounter for antineoplastic immunotherapy: Secondary | ICD-10-CM | POA: Diagnosis not present

## 2016-11-28 DIAGNOSIS — Z792 Long term (current) use of antibiotics: Secondary | ICD-10-CM

## 2016-11-28 DIAGNOSIS — N3281 Overactive bladder: Secondary | ICD-10-CM

## 2016-11-28 DIAGNOSIS — Z86718 Personal history of other venous thrombosis and embolism: Secondary | ICD-10-CM

## 2016-11-28 LAB — URINALYSIS, COMPLETE (UACMP) WITH MICROSCOPIC
Bacteria, UA: NONE SEEN
Bilirubin Urine: NEGATIVE
GLUCOSE, UA: NEGATIVE mg/dL
HGB URINE DIPSTICK: NEGATIVE
Ketones, ur: NEGATIVE mg/dL
NITRITE: NEGATIVE
PH: 6 (ref 5.0–8.0)
Protein, ur: NEGATIVE mg/dL
SPECIFIC GRAVITY, URINE: 1.019 (ref 1.005–1.030)

## 2016-11-28 LAB — COMPREHENSIVE METABOLIC PANEL
ALT: 14 U/L (ref 14–54)
AST: 26 U/L (ref 15–41)
Albumin: 3.4 g/dL — ABNORMAL LOW (ref 3.5–5.0)
Alkaline Phosphatase: 52 U/L (ref 38–126)
Anion gap: 11 (ref 5–15)
BILIRUBIN TOTAL: 0.3 mg/dL (ref 0.3–1.2)
BUN: 17 mg/dL (ref 6–20)
CHLORIDE: 103 mmol/L (ref 101–111)
CO2: 26 mmol/L (ref 22–32)
Calcium: 8.9 mg/dL (ref 8.9–10.3)
Creatinine, Ser: 0.87 mg/dL (ref 0.44–1.00)
GFR, EST NON AFRICAN AMERICAN: 59 mL/min — AB (ref >60)
Glucose, Bld: 109 mg/dL — ABNORMAL HIGH (ref 65–99)
Potassium: 3.7 mmol/L (ref 3.5–5.1)
Sodium: 140 mmol/L (ref 135–145)
TOTAL PROTEIN: 7.2 g/dL (ref 6.5–8.1)

## 2016-11-28 LAB — CBC WITH DIFFERENTIAL/PLATELET
Basophils Absolute: 0 10*3/uL (ref 0–0.1)
Basophils Relative: 1 %
EOS PCT: 1 %
Eosinophils Absolute: 0.1 10*3/uL (ref 0–0.7)
HEMATOCRIT: 35.6 % (ref 35.0–47.0)
Hemoglobin: 11.6 g/dL — ABNORMAL LOW (ref 12.0–16.0)
LYMPHS ABS: 1 10*3/uL (ref 1.0–3.6)
LYMPHS PCT: 24 %
MCH: 29.6 pg (ref 26.0–34.0)
MCHC: 32.6 g/dL (ref 32.0–36.0)
MCV: 90.7 fL (ref 80.0–100.0)
Monocytes Absolute: 0.6 10*3/uL (ref 0.2–0.9)
Monocytes Relative: 13 %
NEUTROS ABS: 2.6 10*3/uL (ref 1.4–6.5)
Neutrophils Relative %: 61 %
PLATELETS: 479 10*3/uL — AB (ref 150–440)
RBC: 3.92 MIL/uL (ref 3.80–5.20)
RDW: 15.4 % — AB (ref 11.5–14.5)
WBC: 4.3 10*3/uL (ref 3.6–11.0)

## 2016-11-28 MED ORDER — SODIUM CHLORIDE 0.9 % IV SOLN
Freq: Once | INTRAVENOUS | Status: AC
  Administered 2016-11-28: 12:00:00 via INTRAVENOUS
  Filled 2016-11-28: qty 1000

## 2016-11-28 MED ORDER — BEVACIZUMAB CHEMO INJECTION 400 MG/16ML
600.0000 mg | Freq: Once | INTRAVENOUS | Status: AC
  Administered 2016-11-28: 600 mg via INTRAVENOUS
  Filled 2016-11-28: qty 24

## 2016-11-28 MED ORDER — HEPARIN SOD (PORK) LOCK FLUSH 100 UNIT/ML IV SOLN
500.0000 [IU] | Freq: Once | INTRAVENOUS | Status: AC | PRN
  Administered 2016-11-28: 500 [IU]

## 2016-11-28 NOTE — Progress Notes (Signed)
Pt in for follow up, lab and Avastin today.

## 2016-11-28 NOTE — Progress Notes (Signed)
[place] OFFICE PROGRESS NOTE  Patient Care Team: Ezequiel Kayser, [name]  No matching staging information was found for the patient.   Oncology History   . 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  Adequate TRS, no macroscopic residual. IP/IV chemotherapy with DDP and paclitaxel on GOG protocol, chemo and Bev consolidation completed in 03/2011 2. 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 November 14, 2011- Patient was started on carboplatin and gemcitabine 4. Finished 6 cycles of chemotherapy in April of 2014 with carboplatin and gemcitabine. Tolerance was fairly good except for neutropenia and thrombocytopenia. 5.recurrent disease by CT scan February of 2015  # .radiation therapy to pelvis  May of 2015  7.upper extremity  . and deep vein thrombosis associated with port.(June of 2015) patient started on   [place] had port removed and had   thrombectomy September 06, 2013. # CT DEC 2016- progressing disease in the right inguinal area # FEB-TAXOL-AVASTIN;  # FEB 2017- AVASTIN q 3W; July 2017- CT- Improved  Right Inguinal LN; STOP AVASTIN sec to potential concerns of AEs  # AUG 2017 3rd- START ZEJULA- declines sec to intol  # OCT 7th CT- STABLE RIGHT INGUINAL LN- RE-START AVASTIN q 3W       Malignant neoplasm of right fallopian tube ([place])   10/05/2009 Initial Diagnosis    Carcinoma of fallopian tube        INTERVAL HISTORY:  KENIESHA ADDERLY 83 y.o.  female pleasant patient above history of Fallopian tube cancer metastatic currently  Avastin is here Follow-up/ review the results of the restaging CAT scan.  Patient denies any more falls. She continues to have mild swelling of the right leg compared to the left. She continues to be fairly active as per family.  Denies any shortness of breath or chest pain. No fevers. No unusual back pain or chills.   Denies any abdominal distention. Denies any chest pain. No nausea no vomiting. She denies any headaches. Denies any epistaxis.   REVIEW OF SYSTEMS:  A complete 10 point review of system is done which is negative except mentioned above/history of present illness.   PAST MEDICAL HISTORY :  Past Medical History:  Diagnosis Date  . Bladder infection, chronic 10/19/2011  . Cancer (HCC)    Ovarian   . Carcinoma of fallopian tube ([place]) 10/05/2009   Overview:  Overview:  Overview:  Drs. Claiborne Rigg and Delorise Shiner Choksi Overview:  Drs. Claiborne Rigg and [employer]   . Concussion   . GERD (gastroesophageal reflux disease)   . MI (mitral incompetence) 10/27/2014   Overview:  MODERATE   . Mitral valve prolapse   . OAB (overactive bladder)   . Tachycardia     PAST SURGICAL HISTORY :   Past Surgical History:  Procedure Laterality Date  . ABDOMINAL HYSTERECTOMY    . CHOLECYSTECTOMY    . LAPAROSCOPIC BILATERAL SALPINGO OOPHERECTOMY  2011  . ovarian cancer surgery  2011    FAMILY HISTORY :   Family History  Problem Relation Age of Onset  . Ovarian cancer Other   . Breast cancer Neg Hx   . Colon cancer Neg Hx   . Diabetes Neg Hx   . Heart disease Neg Hx   . Kidney disease Neg Hx   . Bladder Cancer Neg Hx     SOCIAL HISTORY:   Social History  Substance Use Topics  .  Smoking status: Never Smoker  . Smokeless tobacco: Never Used  . Alcohol use No    ALLERGIES:  is allergic to benadryl [diphenhydramine]; tamsulosin; alendronate sodium; duloxetine hcl; risedronate sodium; lovastatin; and sulfa antibiotics.  MEDICATIONS:  Current Outpatient Prescriptions  Medication Sig Dispense Refill  . acetaminophen (TYLENOL) 650 MG CR tablet Take 650 mg by mouth every 8 (eight) hours as needed for pain.    . Ascorbic Acid (VITAMIN C) 100 MG tablet Take 100 mg by mouth daily.    Marland Kitchen aspirin EC 81 MG tablet Take 81 mg by mouth.    . cetirizine (ZYRTEC) 10 MG tablet Take 10 mg by mouth daily.     .  Cholecalciferol (VITAMIN D3) 2000 UNITS capsule Take 2,000 Units by mouth daily.     Marland Kitchen docusate sodium (COLACE) 100 MG capsule Take 100 mg by mouth.    . fluticasone (FLONASE) 50 MCG/ACT nasal spray Place 1 spray into both nostrils daily.     Marland Kitchen gabapentin (NEURONTIN) 600 MG tablet TAKE (1) TABLET BY MOUTH THREE TIMES A DAY (Patient taking differently: Take 600 mg by mouth 2 (two) times daily as needed. TAKE (1) TABLET BY MOUTH THREE TIMES A DAY) 180 tablet 0  . naproxen sodium (ANAPROX) 220 MG tablet Take 220 mg by mouth 2 (two) times daily with a meal.    . nitrofurantoin, macrocrystal-monohydrate, (MACROBID) 100 MG capsule Take 1 capsule (100 mg total) by mouth daily. 30 capsule 3  . ondansetron (ZOFRAN) 4 MG tablet Take 4 mg by mouth every 8 (eight) hours as needed for nausea or vomiting.    . pantoprazole (PROTONIX) 40 MG tablet Take 40 mg by mouth.    . potassium chloride (K-DUR,KLOR-CON) 10 MEQ tablet Take 10 mEq by mouth once.     . Pyridoxine HCl (VITAMIN B-6) 500 MG tablet Take 500 mg by mouth daily.     No current facility-administered medications for this visit.    Facility-Administered Medications Ordered in Other Visits  Medication Dose Route Frequency Provider Last Rate Last Dose  . diphenhydrAMINE (BENADRYL) injection 50 mg  50 mg Intravenous Once Choksi, Janak, MD      . sodium chloride 0.9 % injection 10 mL  10 mL Intravenous PRN Forest Gleason, MD   10 mL at 01/12/15 1116  . sodium chloride 0.9 % injection 10 mL  10 mL Intravenous PRN Forest Gleason, MD   10 mL at 02/16/15 1025    PHYSICAL EXAMINATION: ECOG PERFORMANCE STATUS: 2 - Symptomatic, <50% confined to bed  BP 120/76 (BP Location: Left Arm, Patient Position: Sitting)   Pulse 69   Temp 98.6 F (37 C) (Tympanic)   Resp 20   Wt 128 lb 6 oz (58.2 kg)   BMI 25.07 kg/m   Filed Weights   11/28/16 1028  Weight: 128 lb 6 oz (58.2 kg)    GENERAL: Well-nourished well-developed; Alert, no distress and comfortable.   She is walking herself. Accompanied by daughter-in-law.  EYES: no pallor or icterus OROPHARYNX: no thrush or ulceration; good dentition  NECK: supple, no masses felt LYMPH:  no palpable lymphadenopathy in the cervical, axillary; Right ingiunal LN ~2-3cm in size.  LUNGS: clear to auscultation and  No wheeze or crackles HEART/CVS: regular rate & rhythm and no murmurs; chronic right lower extremity swelling- worse today.  Pulses intact. ABDOMEN:abdomen soft, non-tender and normal bowel sounds Musculoskeletal:no cyanosis of digits and no clubbing  PSYCH: alert & oriented x 3 with fluent speech NEURO: no focal  motor/sensory deficits SKIN:  no rashes or significant lesions.  LABORATORY DATA:  I have reviewed the data as listed    Component Value Date/Time   NA 140 11/28/2016 1004   NA 138 06/02/2014 0910   K 3.7 11/28/2016 1004   K 3.7 06/02/2014 0910   CL 103 11/28/2016 1004   CL 107 06/02/2014 0910   CO2 26 11/28/2016 1004   CO2 26 06/02/2014 0910   GLUCOSE 109 (H) 11/28/2016 1004   GLUCOSE 106 (H) 06/02/2014 0910   BUN 17 11/28/2016 1004   BUN 17 06/02/2014 0910   CREATININE 0.87 11/28/2016 1004   CREATININE 1.00 06/02/2014 0910   CALCIUM 8.9 11/28/2016 1004   CALCIUM 8.6 (L) 06/02/2014 0910   PROT 7.2 11/28/2016 1004   PROT 6.3 (L) 06/02/2014 0910   ALBUMIN 3.4 (L) 11/28/2016 1004   ALBUMIN 3.4 (L) 06/02/2014 0910   AST 26 11/28/2016 1004   AST 23 06/02/2014 0910   ALT 14 11/28/2016 1004   ALT 10 (L) 06/02/2014 0910   ALKPHOS 52 11/28/2016 1004   ALKPHOS 46 06/02/2014 0910   BILITOT 0.3 11/28/2016 1004   BILITOT 0.5 06/02/2014 0910   GFRNONAA 59 (L) 11/28/2016 1004   GFRNONAA 52 (L) 06/02/2014 0910   GFRAA >60 11/28/2016 1004   GFRAA >60 06/02/2014 0910    No results found for: SPEP, UPEP  Lab Results  Component Value Date   WBC 4.3 11/28/2016   NEUTROABS 2.6 11/28/2016   HGB 11.6 (L) 11/28/2016   HCT 35.6 11/28/2016   MCV 90.7 11/28/2016   PLT 479 (H)  11/28/2016      Chemistry      Component Value Date/Time   NA 140 11/28/2016 1004   NA 138 06/02/2014 0910   K 3.7 11/28/2016 1004   K 3.7 06/02/2014 0910   CL 103 11/28/2016 1004   CL 107 06/02/2014 0910   CO2 26 11/28/2016 1004   CO2 26 06/02/2014 0910   BUN 17 11/28/2016 1004   BUN 17 06/02/2014 0910   CREATININE 0.87 11/28/2016 1004   CREATININE 1.00 06/02/2014 0910      Component Value Date/Time   CALCIUM 8.9 11/28/2016 1004   CALCIUM 8.6 (L) 06/02/2014 0910   ALKPHOS 52 11/28/2016 1004   ALKPHOS 46 06/02/2014 0910   AST 26 11/28/2016 1004   AST 23 06/02/2014 0910   ALT 14 11/28/2016 1004   ALT 10 (L) 06/02/2014 0910   BILITOT 0.3 11/28/2016 1004   BILITOT 0.5 06/02/2014 0910     IMPRESSION: 1. No interval change from CT 07/30/2016. 2. Stable low-density necrotic lymph nodes in the RIGHT inguinal region. 3. Post hysterectomy. No evidence of local GYN malignancy recurrence   Electronically Signed   By: Suzy Bouchard M.D.   On: 11/27/2016 14:29 RADIOGRAPHIC STUDIES: I have personally reviewed the radiological images as listed and agreed with the findings in the report. Ct Abdomen Pelvis W Contrast  Result Date: 11/27/2016 CLINICAL DATA:  Malignant neoplasm of the RIGHT fallopian tube. EXAM: CT ABDOMEN AND PELVIS WITH CONTRAST TECHNIQUE: Multidetector CT imaging of the abdomen and pelvis was performed using the standard protocol following bolus administration of intravenous contrast. CONTRAST:  139mL ISOVUE-300 IOPAMIDOL (ISOVUE-300) INJECTION 61% COMPARISON:  07/30/2016 FINDINGS: Lower chest: Lung bases are clear Hepatobiliary: No focal hepatic lesion. Postcholecystectomy. No biliary dilatation. Pancreas: Pancreas is normal. No ductal dilatation. No pancreatic inflammation. Spleen: Small spleen unchanged Adrenals/urinary tract: Adrenal glands and kidneys are normal. The ureters and bladder  normal. Stomach/Bowel: Large hiatal hernia. Small-bowel normal. Moderate  volume stool throughout the colon. No obstruction identified. Diverticular the descending colon sigmoid colon. Rectum normal Vascular/Lymphatic: Abdominal aorta is normal caliber. There is no retroperitoneal or periportal lymphadenopathy. No pelvic lymphadenopathy. Reproductive: Post hysterectomy. Pessary device in the vagina. No pelvic sidewall nodularity. Other: 2 low-density RIGHT inguinal nodes measuring up to 2 cm each are unchanged Musculoskeletal: No aggressive osseous lesion. IMPRESSION: 1. No interval change from CT 07/30/2016. 2. Stable low-density necrotic lymph nodes in the RIGHT inguinal region. 3. Post hysterectomy. No evidence of local GYN malignancy recurrence Electronically Signed   By: Suzy Bouchard M.D.   On: 11/27/2016 14:29     ASSESSMENT & PLAN:  Malignant neoplasm of right fallopian tube [facility]) Metastatic fallopian tube cancer- Currently on Avastin since February 2017. OCT 22nd CT scan- STABLE Lymphadenopathy in right groin [increase by few mm]; otherwise no evidence of disease anywhere else. No clinical progression noted.  # Proceed with Avastin today. Labs acceptable. UA shows no proteinuria.  # Right LE swelling- Likely secondary to inguinal adenopathy/ recommend stockings leg elevation. Lasix only as needed.   # incidental 1mm pancreatic lesion-  CT- Oct 22nd 2018 shows no new progression  # Chronic UTIs-Continue Macrobid 100 mg once a day.   # follow up in 3 weeks /labs/ ca-125 Avastin infusion.   # I reviewed the blood work- with the patient in detail; also reviewed the imaging independently [as summarized above]; and with the patient in detail.   CC: Dr.Kowalski.    Orders Placed This Encounter  Procedures  . CBC with Differential/Platelet    Standing Status:   Future    Standing Expiration Date:   11/28/2017  . Comprehensive metabolic panel    Standing Status:   Future    Standing Expiration Date:   11/28/2017  . CA 125    Standing Status:   Future     Standing Expiration Date:   11/28/2017  . Urinalysis, Complete w Microscopic    Standing Status:   Future    Standing Expiration Date:   01/28/2017      [name], MD 11/28/2016 11:39 AM

## 2016-11-28 NOTE — Progress Notes (Signed)
Dr. Rogue Bussing gave order to continue with today's treatment without results of urinalysis.

## 2016-11-28 NOTE — Assessment & Plan Note (Addendum)
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 OCT 22nd CT scan- STABLE Lymphadenopathy in right groin [increase by few mm]; otherwise no evidence of disease anywhere else. No clinical progression noted.  # Proceed with Avastin today. Labs acceptable. UA shows no proteinuria.  # Right LE swelling- Likely secondary to inguinal adenopathy/ recommend stockings leg elevation. Lasix only as needed.   # incidental 11mm pancreatic lesion-  CT- Oct 22nd 2018 shows no new progression  # Chronic UTIs-Continue Macrobid 100 mg once a day.   # follow up in 3 weeks /labs/ ca-125 Avastin infusion.   # I reviewed the blood work- with the patient in detail; also reviewed the imaging independently [as summarized above]; and with the patient in detail.   CC: Dr.Kowalski.

## 2016-11-29 ENCOUNTER — Ambulatory Visit: Payer: Medicare HMO

## 2016-11-29 LAB — CA 125: CANCER ANTIGEN (CA) 125: 26.5 U/mL (ref 0.0–38.1)

## 2016-12-04 DIAGNOSIS — Z8543 Personal history of malignant neoplasm of ovary: Secondary | ICD-10-CM | POA: Diagnosis not present

## 2016-12-04 DIAGNOSIS — Z7982 Long term (current) use of aspirin: Secondary | ICD-10-CM | POA: Diagnosis not present

## 2016-12-04 DIAGNOSIS — Z6825 Body mass index (BMI) 25.0-25.9, adult: Secondary | ICD-10-CM | POA: Diagnosis not present

## 2016-12-04 DIAGNOSIS — R110 Nausea: Secondary | ICD-10-CM | POA: Diagnosis not present

## 2016-12-04 DIAGNOSIS — J309 Allergic rhinitis, unspecified: Secondary | ICD-10-CM | POA: Diagnosis not present

## 2016-12-04 DIAGNOSIS — R1310 Dysphagia, unspecified: Secondary | ICD-10-CM | POA: Diagnosis not present

## 2016-12-04 DIAGNOSIS — I479 Paroxysmal tachycardia, unspecified: Secondary | ICD-10-CM | POA: Diagnosis not present

## 2016-12-04 DIAGNOSIS — Z0000 Encounter for general adult medical examination without abnormal findings: Secondary | ICD-10-CM | POA: Diagnosis not present

## 2016-12-04 DIAGNOSIS — K219 Gastro-esophageal reflux disease without esophagitis: Secondary | ICD-10-CM | POA: Diagnosis not present

## 2016-12-04 DIAGNOSIS — G620 Drug-induced polyneuropathy: Secondary | ICD-10-CM | POA: Diagnosis not present

## 2016-12-06 IMAGING — CT CT ABD-PELV W/ CM
1 of 3 series · 13 of 32 positions shown, 18 images · IV contrast (APPLIED)
Comparison: 01/11/2015

CLINICAL DATA: Restaging fallopian tube carcinoma. Initial
diagnosis 0788.

EXAM:
CT ABDOMEN AND PELVIS WITH CONTRAST
TECHNIQUE: Multidetector CT imaging of the abdomen and pelvis was performed
using the standard protocol following bolus administration of
intravenous contrast.
CONTRAST:  85mL [id number] IOPAMIDOL ([id number]) INJECTION 61%

[Series 2: axial st · axial · 0.70mm/px · z∈[-818,-468]mm · 13 of 80 slices shown, 18 images]
[im 5/80  soft-tissue]
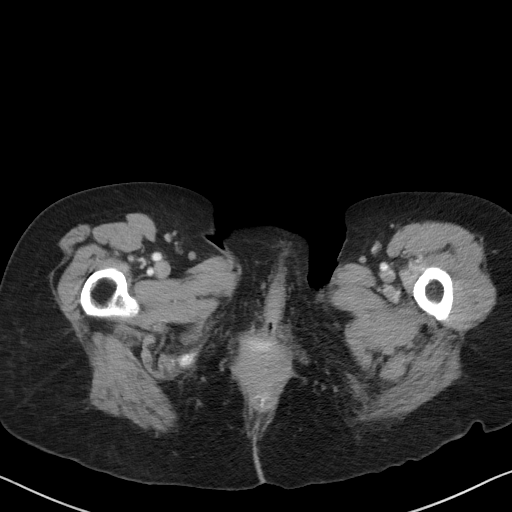
[im 5/80  bone]
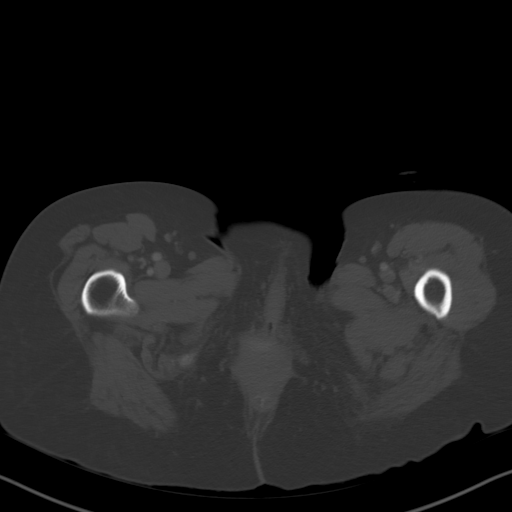
[im 13/80  soft-tissue]
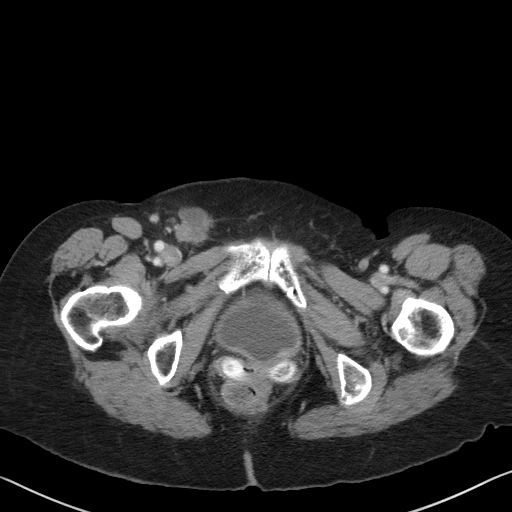
[im 17/80  soft-tissue]
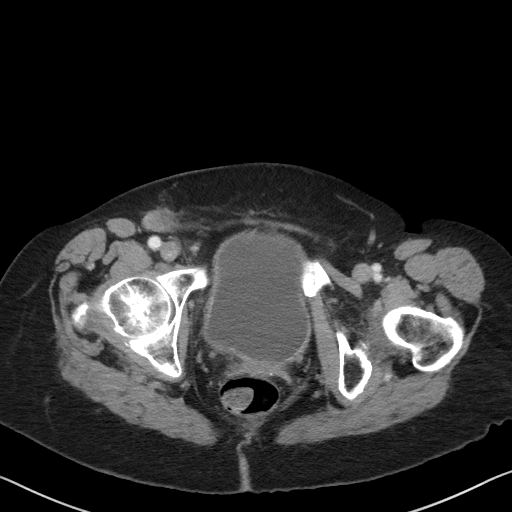
[im 25/80  soft-tissue]
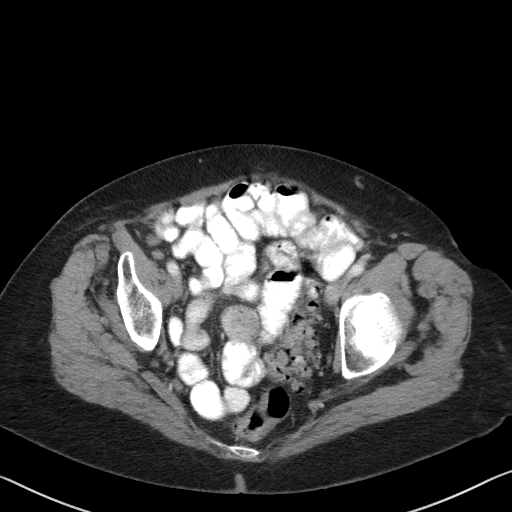
[im 30/80  soft-tissue]
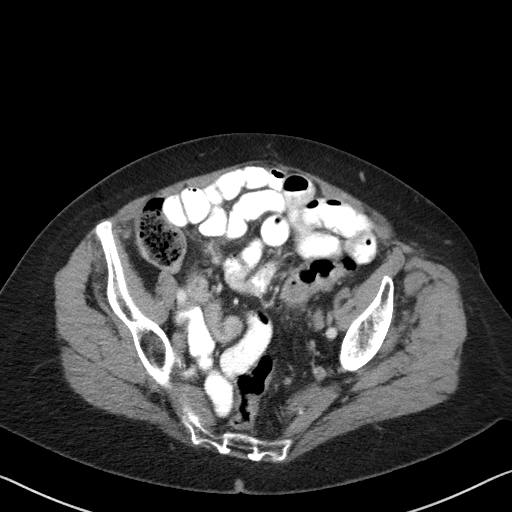
[im 38/80  soft-tissue]
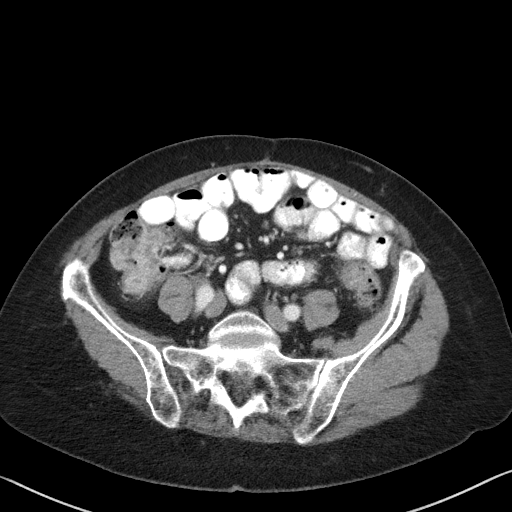
[im 42/80  soft-tissue]
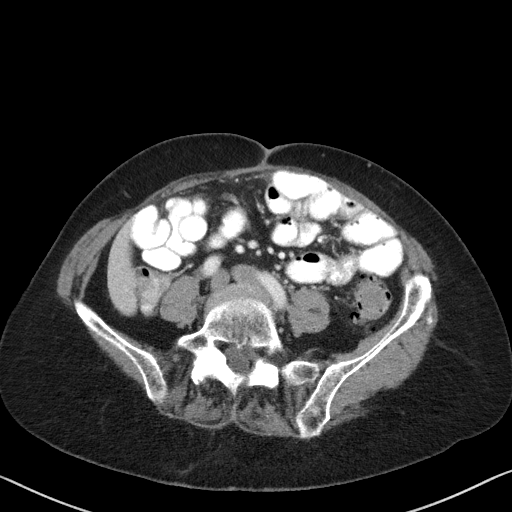
[im 50/80  soft-tissue]
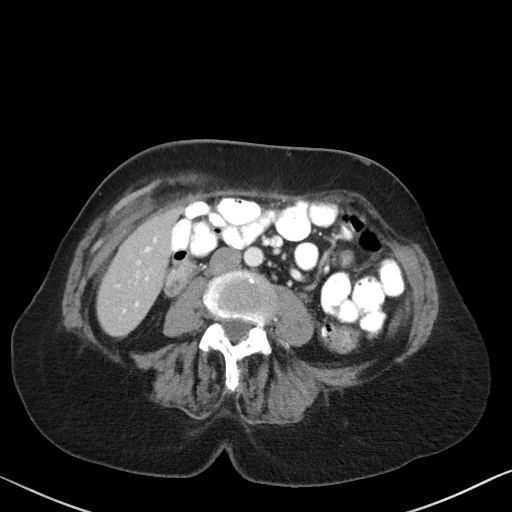
[im 55/80  soft-tissue]
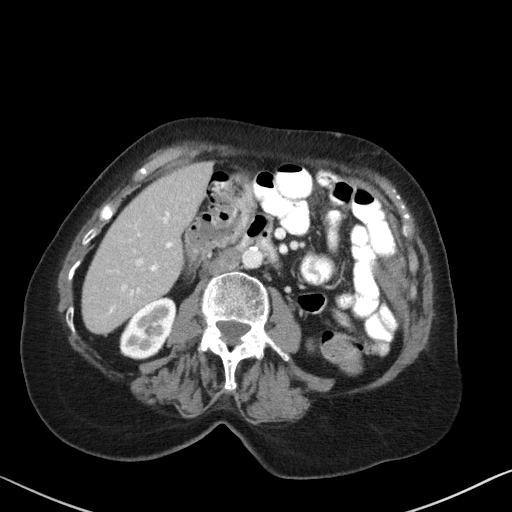
[im 55/80  bone]
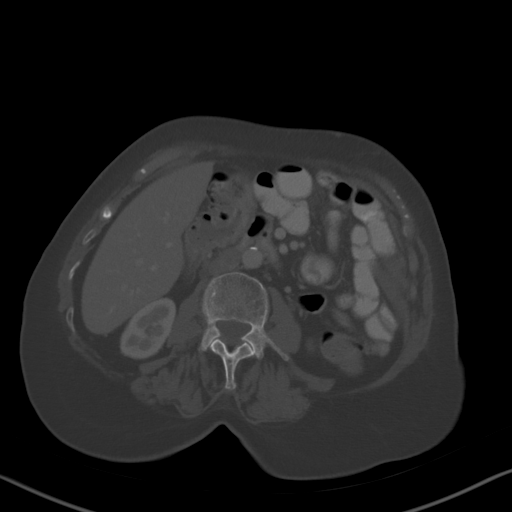
[im 63/80  soft-tissue]
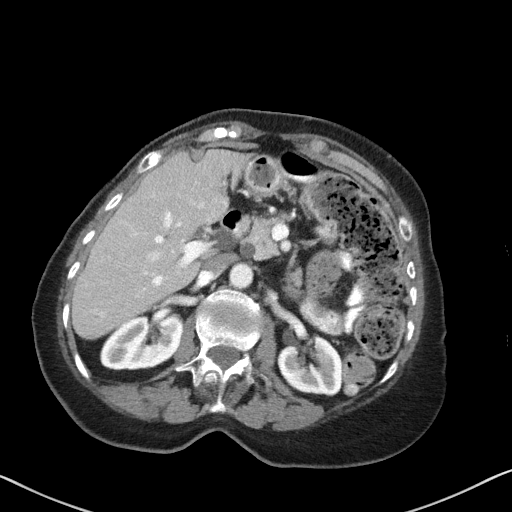
[im 63/80  lung]
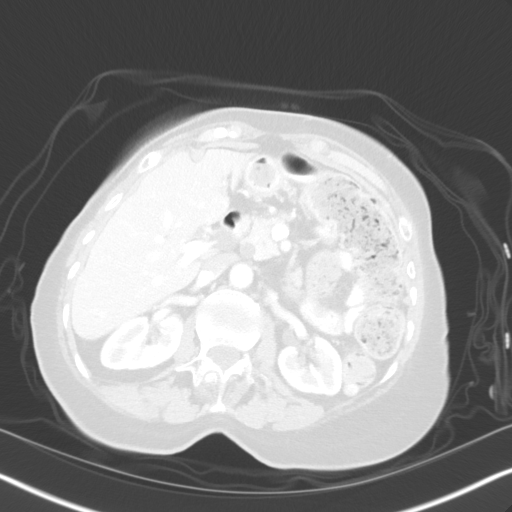
[im 67/80  soft-tissue]
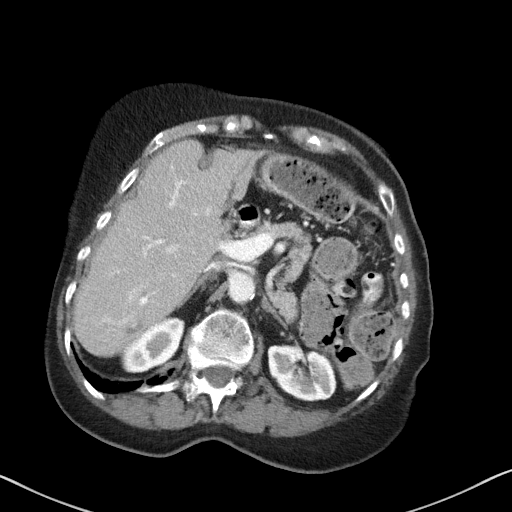
[im 67/80  lung]
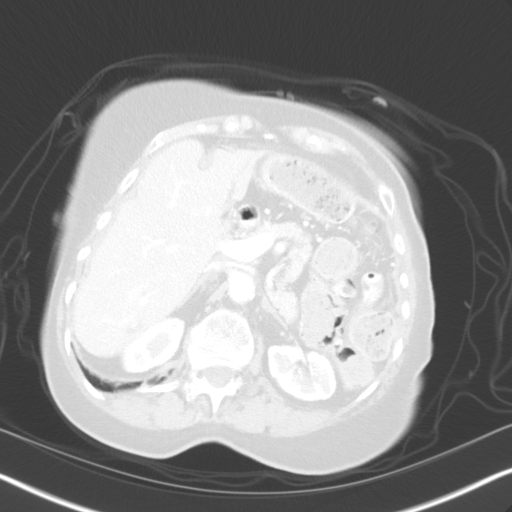
[im 71/80  lung]
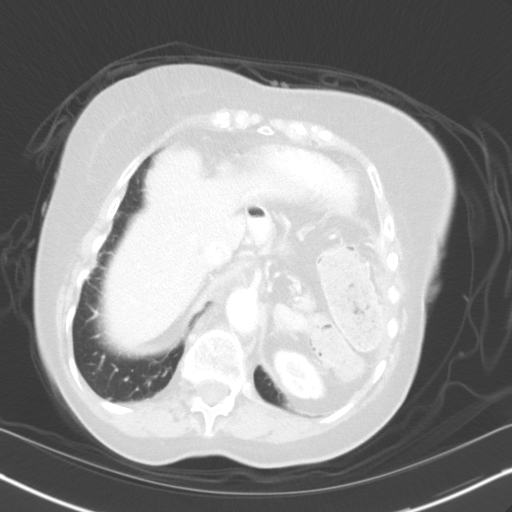
[im 75/80  soft-tissue]
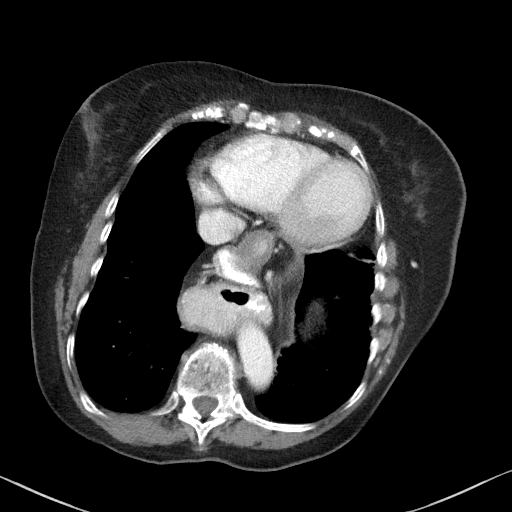
[im 75/80  lung]
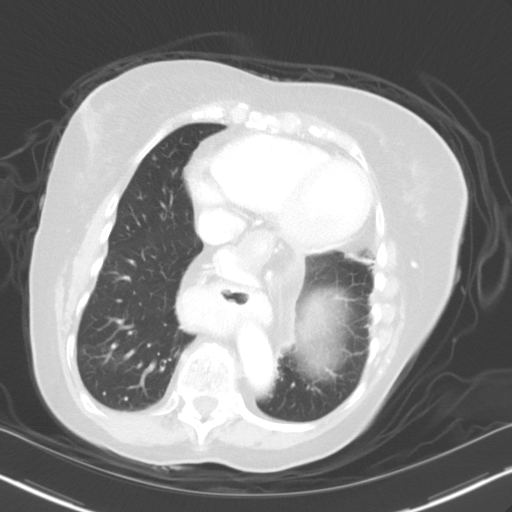

[13 of 32 positions shown; findings below may reference images not displayed]

FINDINGS: Lower chest: The lung bases are clear of acute process. No worrisome
pulmonary nodules or pleural effusion. The heart is within normal
limits in size for age. No pericardial effusion. Stable large hiatal
hernia.

Hepatobiliary: No focal hepatic lesions or intrahepatic biliary
dilatation. The hemangioma involving the right hepatic lobe near the
kidney is not as well seen on today's examination.

Pancreas: Stable mild common bile duct dilatation status post
cholecystectomy. No pancreatic mass, inflammation or ductal
dilatation.

Spleen: Stable small spleen.  No focal lesions.

Adrenals/Urinary Tract: The adrenal glands and kidneys are
unremarkable and stable.

Stomach/Bowel: Most of the stomach is up in the chest. The duodenum,
small bowel and colon are unremarkable. No inflammatory changes,
mass lesions or obstructive findings. Stable diverticulosis. No
findings for acute diverticulitis. The terminal ileum is normal.

Vascular/Lymphatic: No mesenteric or retroperitoneal mass or
adenopathy. Small scattered lymph nodes are stable. The aorta is
normal in caliber. Stable atherosclerotic calcifications. The major
venous structures are patent.

Reproductive: Status post hysterectomy.

Other: No pelvic mass or lymphadenopathy. The bladder appears
normal. A pessary ring is noted in the vagina.

Interval decrease in size of the necrotic adenopathy in the right
inguinal area. The more superior and lateral nodal lesion measures
27 x 19 mm and previously measured 32 x 31 mm. The more inferior and
medial lesion measures 25 x 24 mm and previously measured 48 x 35
mm. No left-sided inguinal adenopathy.

Musculoskeletal: No significant bony findings.
IMPRESSION: 1. Interval decrease in size of the necrotic appearing right
inguinal adenopathy.
2. No abdominal/pelvic lymphadenopathy or findings for peritoneal
surface disease.
3. Status post cholecystectomy with stable mild associated biliary
dilatation.
4. Stable large hiatal hernia.

## 2016-12-19 ENCOUNTER — Inpatient Hospital Stay: Payer: Medicare HMO | Attending: Internal Medicine | Admitting: Internal Medicine

## 2016-12-19 ENCOUNTER — Inpatient Hospital Stay: Payer: Medicare HMO

## 2016-12-19 VITALS — BP 138/92 | HR 70 | Temp 97.4°F | Resp 16 | Wt 128.0 lb

## 2016-12-19 DIAGNOSIS — Z9221 Personal history of antineoplastic chemotherapy: Secondary | ICD-10-CM | POA: Diagnosis not present

## 2016-12-19 DIAGNOSIS — Z923 Personal history of irradiation: Secondary | ICD-10-CM | POA: Insufficient documentation

## 2016-12-19 DIAGNOSIS — Z7982 Long term (current) use of aspirin: Secondary | ICD-10-CM

## 2016-12-19 DIAGNOSIS — R51 Headache: Secondary | ICD-10-CM | POA: Diagnosis not present

## 2016-12-19 DIAGNOSIS — Z792 Long term (current) use of antibiotics: Secondary | ICD-10-CM

## 2016-12-19 DIAGNOSIS — C5701 Malignant neoplasm of right fallopian tube: Secondary | ICD-10-CM | POA: Diagnosis not present

## 2016-12-19 DIAGNOSIS — Z8041 Family history of malignant neoplasm of ovary: Secondary | ICD-10-CM | POA: Insufficient documentation

## 2016-12-19 DIAGNOSIS — K219 Gastro-esophageal reflux disease without esophagitis: Secondary | ICD-10-CM | POA: Diagnosis not present

## 2016-12-19 DIAGNOSIS — R600 Localized edema: Secondary | ICD-10-CM

## 2016-12-19 DIAGNOSIS — R590 Localized enlarged lymph nodes: Secondary | ICD-10-CM | POA: Insufficient documentation

## 2016-12-19 DIAGNOSIS — Z79899 Other long term (current) drug therapy: Secondary | ICD-10-CM | POA: Insufficient documentation

## 2016-12-19 DIAGNOSIS — N390 Urinary tract infection, site not specified: Secondary | ICD-10-CM | POA: Diagnosis not present

## 2016-12-19 DIAGNOSIS — Z90710 Acquired absence of both cervix and uterus: Secondary | ICD-10-CM | POA: Diagnosis not present

## 2016-12-19 DIAGNOSIS — Z90722 Acquired absence of ovaries, bilateral: Secondary | ICD-10-CM | POA: Diagnosis not present

## 2016-12-19 DIAGNOSIS — C774 Secondary and unspecified malignant neoplasm of inguinal and lower limb lymph nodes: Secondary | ICD-10-CM | POA: Diagnosis not present

## 2016-12-19 DIAGNOSIS — Z7901 Long term (current) use of anticoagulants: Secondary | ICD-10-CM | POA: Insufficient documentation

## 2016-12-19 DIAGNOSIS — Z87440 Personal history of urinary (tract) infections: Secondary | ICD-10-CM | POA: Insufficient documentation

## 2016-12-19 DIAGNOSIS — Z86718 Personal history of other venous thrombosis and embolism: Secondary | ICD-10-CM

## 2016-12-19 LAB — CBC WITH DIFFERENTIAL/PLATELET
BASOS ABS: 0.1 10*3/uL (ref 0–0.1)
Basophils Relative: 2 %
Eosinophils Absolute: 0.1 10*3/uL (ref 0–0.7)
Eosinophils Relative: 1 %
HEMATOCRIT: 35.6 % (ref 35.0–47.0)
HEMOGLOBIN: 11.5 g/dL — AB (ref 12.0–16.0)
LYMPHS PCT: 30 %
Lymphs Abs: 1.4 10*3/uL (ref 1.0–3.6)
MCH: 29.3 pg (ref 26.0–34.0)
MCHC: 32.2 g/dL (ref 32.0–36.0)
MCV: 90.9 fL (ref 80.0–100.0)
Monocytes Absolute: 0.7 10*3/uL (ref 0.2–0.9)
Monocytes Relative: 15 %
NEUTROS ABS: 2.5 10*3/uL (ref 1.4–6.5)
Neutrophils Relative %: 52 %
Platelets: 450 10*3/uL — ABNORMAL HIGH (ref 150–440)
RBC: 3.91 MIL/uL (ref 3.80–5.20)
RDW: 15.4 % — ABNORMAL HIGH (ref 11.5–14.5)
WBC: 4.7 10*3/uL (ref 3.6–11.0)

## 2016-12-19 LAB — COMPREHENSIVE METABOLIC PANEL
ALBUMIN: 3.5 g/dL (ref 3.5–5.0)
ALT: 13 U/L — ABNORMAL LOW (ref 14–54)
ANION GAP: 7 (ref 5–15)
AST: 25 U/L (ref 15–41)
Alkaline Phosphatase: 55 U/L (ref 38–126)
BILIRUBIN TOTAL: 0.6 mg/dL (ref 0.3–1.2)
BUN: 21 mg/dL — ABNORMAL HIGH (ref 6–20)
CO2: 25 mmol/L (ref 22–32)
Calcium: 8.8 mg/dL — ABNORMAL LOW (ref 8.9–10.3)
Chloride: 103 mmol/L (ref 101–111)
Creatinine, Ser: 0.82 mg/dL (ref 0.44–1.00)
GFR calc Af Amer: >60 mL/min (ref >60)
GLUCOSE: 111 mg/dL — AB (ref 65–99)
POTASSIUM: 4 mmol/L (ref 3.5–5.1)
Sodium: 135 mmol/L (ref 135–145)
TOTAL PROTEIN: 7.1 g/dL (ref 6.5–8.1)

## 2016-12-19 LAB — URINALYSIS, COMPLETE (UACMP) WITH MICROSCOPIC
BILIRUBIN URINE: NEGATIVE
GLUCOSE, UA: NEGATIVE mg/dL
HGB URINE DIPSTICK: NEGATIVE
Ketones, ur: NEGATIVE mg/dL
NITRITE: NEGATIVE
PH: 7 (ref 5.0–8.0)
Protein, ur: NEGATIVE mg/dL
RBC / HPF: NONE SEEN RBC/hpf (ref 0–5)
SPECIFIC GRAVITY, URINE: 1.012 (ref 1.005–1.030)

## 2016-12-19 MED ORDER — HEPARIN SOD (PORK) LOCK FLUSH 100 UNIT/ML IV SOLN
500.0000 [IU] | Freq: Once | INTRAVENOUS | Status: AC
  Administered 2016-12-19: 500 [IU] via INTRAVENOUS
  Filled 2016-12-19: qty 5

## 2016-12-19 NOTE — Progress Notes (Signed)
[facility] OFFICE PROGRESS NOTE  Patient Care Team: Ezequiel Kayser, [name]  No matching staging information was found for the patient.   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  Adequate TRS, no macroscopic residual. IP/IV chemotherapy with DDP and paclitaxel on GOG protocol, chemo and Bev consolidation completed in 03/2011 2. 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 November 14, 2011- Patient was started on carboplatin and gemcitabine 4. Finished 6 cycles of chemotherapy in April of 2014 with carboplatin and gemcitabine. Tolerance was fairly good except for neutropenia and thrombocytopenia. 5.recurrent disease by CT scan February of 2015  # .radiation therapy to pelvis  May of 2015  7.upper extremity  . and deep vein thrombosis associated with port.(June of 2015) patient started on   Trevose had port removed and had   thrombectomy September 06, 2013. # CT DEC 2016- progressing disease in the right inguinal area # FEB-TAXOL-AVASTIN;  # FEB 2017- AVASTIN q 3W; July 2017- CT- Improved  Right Inguinal LN; STOP AVASTIN sec to potential concerns of AEs  # AUG 2017 3rd- START ZEJULA- declines sec to intol  # OCT 7th CT- STABLE RIGHT INGUINAL LN- RE-START AVASTIN q 3W       Malignant neoplasm of right fallopian tube ([place])   10/05/2009 Initial Diagnosis    Carcinoma of fallopian tube        INTERVAL HISTORY:  Nicole Clayton 83 y.o.  female pleasant patient above history of Fallopian tube cancer metastatic currently  Avastin is here Follow-up.  Patient noted to have worsening headaches over the last few months.  Patient has been referred to neurology-however has not had an appointment yet.  Denies any epistaxis.  Appetite is fair. She continues to have mild swelling of the right leg compared to the left.   Denies any shortness  of breath or chest pain. No fevers. No unusual back pain or chills.  Denies any abdominal distention. Denies any chest pain. No nausea no vomiting.   REVIEW OF SYSTEMS:  A complete 10 point review of system is done which is negative except mentioned above/history of present illness.   PAST MEDICAL HISTORY :  Past Medical History:  Diagnosis Date  . Bladder infection, chronic 10/19/2011  . Cancer (HCC)    Ovarian   . Carcinoma of fallopian tube ([place]) 10/05/2009   Overview:  Overview:  Overview:  Drs. Claiborne Rigg and Delorise Shiner Choksi Overview:  Drs. Claiborne Rigg and [employer]   . Concussion   . GERD (gastroesophageal reflux disease)   . MI (mitral incompetence) 10/27/2014   Overview:  MODERATE   . Mitral valve prolapse   . OAB (overactive bladder)   . Tachycardia     PAST SURGICAL HISTORY :   Past Surgical History:  Procedure Laterality Date  . ABDOMINAL HYSTERECTOMY    . CHOLECYSTECTOMY    . LAPAROSCOPIC BILATERAL SALPINGO OOPHERECTOMY  2011  . ovarian cancer surgery  2011    FAMILY HISTORY :   Family History  Problem Relation Age of Onset  . Ovarian cancer Other   . Breast cancer Neg Hx   . Colon cancer Neg Hx   . Diabetes Neg Hx   . Heart disease Neg Hx   . Kidney disease Neg Hx   . Bladder Cancer Neg Hx     SOCIAL HISTORY:   Social History  Tobacco Use  . Smoking status: Never Smoker  . Smokeless tobacco: Never Used  Substance Use Topics  . Alcohol use: No  . Drug use: No    ALLERGIES:  is allergic to benadryl [diphenhydramine]; tamsulosin; alendronate sodium; duloxetine hcl; risedronate sodium; lovastatin; and sulfa antibiotics.  MEDICATIONS:  Current Outpatient Medications  Medication Sig Dispense Refill  . acetaminophen (TYLENOL) 650 MG CR tablet Take 650 mg by mouth every 8 (eight) hours as needed for pain.    . Ascorbic Acid (VITAMIN C) 100 MG tablet Take 100 mg by mouth daily.    Marland Kitchen aspirin EC 81 MG tablet Take 81 mg by mouth.    . cetirizine  (ZYRTEC) 10 MG tablet Take 10 mg by mouth daily.     . Cholecalciferol (VITAMIN D3) 2000 UNITS capsule Take 2,000 Units by mouth daily.     Marland Kitchen docusate sodium (COLACE) 100 MG capsule Take 100 mg by mouth.    . fluticasone (FLONASE) 50 MCG/ACT nasal spray Place 1 spray into both nostrils daily.     Marland Kitchen gabapentin (NEURONTIN) 600 MG tablet TAKE (1) TABLET BY MOUTH THREE TIMES A DAY (Patient taking differently: Take 600 mg by mouth 2 (two) times daily as needed. TAKE (1) TABLET BY MOUTH THREE TIMES A DAY) 180 tablet 0  . naproxen sodium (ANAPROX) 220 MG tablet Take 220 mg by mouth 2 (two) times daily with a meal.    . nitrofurantoin, macrocrystal-monohydrate, (MACROBID) 100 MG capsule Take 1 capsule (100 mg total) by mouth daily. 30 capsule 3  . ondansetron (ZOFRAN) 4 MG tablet Take 4 mg by mouth every 8 (eight) hours as needed for nausea or vomiting.    . pantoprazole (PROTONIX) 40 MG tablet Take 40 mg by mouth.    . potassium chloride (K-DUR,KLOR-CON) 10 MEQ tablet Take 10 mEq by mouth once.     . Pyridoxine HCl (VITAMIN B-6) 500 MG tablet Take 500 mg by mouth daily.     No current facility-administered medications for this visit.    Facility-Administered Medications Ordered in Other Visits  Medication Dose Route Frequency Provider Last Rate Last Dose  . diphenhydrAMINE (BENADRYL) injection 50 mg  50 mg Intravenous Once Choksi, Janak, MD      . sodium chloride 0.9 % injection 10 mL  10 mL Intravenous PRN Forest Gleason, MD   10 mL at 01/12/15 1116  . sodium chloride 0.9 % injection 10 mL  10 mL Intravenous PRN Forest Gleason, MD   10 mL at 02/16/15 1025    PHYSICAL EXAMINATION: ECOG PERFORMANCE STATUS: 2 - Symptomatic, <50% confined to bed  BP (!) 138/92 (BP Location: Left Arm, Patient Position: Sitting)   Pulse 70   Temp (!) 97.4 F (36.3 C)   Resp 16   Wt 128 lb (58.1 kg)   BMI 25.00 kg/m   Filed Weights   12/19/16 1038  Weight: 128 lb (58.1 kg)    GENERAL: Well-nourished  well-developed; Alert, no distress and comfortable.  She is walking herself. Accompanied by daughter-in-law.  EYES: no pallor or icterus OROPHARYNX: no thrush or ulceration; good dentition  NECK: supple, no masses felt LYMPH:  no palpable lymphadenopathy in the cervical, axillary; Right ingiunal LN ~2-3cm in size.  LUNGS: clear to auscultation and  No wheeze or crackles HEART/CVS: regular rate & rhythm and no murmurs; chronic right lower extremity swelling- worse today.  Pulses intact. ABDOMEN:abdomen soft, non-tender and normal bowel sounds Musculoskeletal:no cyanosis of digits and no clubbing  PSYCH: alert &  oriented x 3 with fluent speech NEURO: no focal motor/sensory deficits SKIN:  no rashes or significant lesions.  LABORATORY DATA:  I have reviewed the data as listed    Component Value Date/Time   NA 135 12/19/2016 0952   NA 138 06/02/2014 0910   K 4.0 12/19/2016 0952   K 3.7 06/02/2014 0910   CL 103 12/19/2016 0952   CL 107 06/02/2014 0910   CO2 25 12/19/2016 0952   CO2 26 06/02/2014 0910   GLUCOSE 111 (H) 12/19/2016 0952   GLUCOSE 106 (H) 06/02/2014 0910   BUN 21 (H) 12/19/2016 0952   BUN 17 06/02/2014 0910   CREATININE 0.82 12/19/2016 0952   CREATININE 1.00 06/02/2014 0910   CALCIUM 8.8 (L) 12/19/2016 0952   CALCIUM 8.6 (L) 06/02/2014 0910   PROT 7.1 12/19/2016 0952   PROT 6.3 (L) 06/02/2014 0910   ALBUMIN 3.5 12/19/2016 0952   ALBUMIN 3.4 (L) 06/02/2014 0910   AST 25 12/19/2016 0952   AST 23 06/02/2014 0910   ALT 13 (L) 12/19/2016 0952   ALT 10 (L) 06/02/2014 0910   ALKPHOS 55 12/19/2016 0952   ALKPHOS 46 06/02/2014 0910   BILITOT 0.6 12/19/2016 0952   BILITOT 0.5 06/02/2014 0910   GFRNONAA >60 12/19/2016 0952   GFRNONAA 52 (L) 06/02/2014 0910   GFRAA >60 12/19/2016 0952   GFRAA >60 06/02/2014 0910    No results found for: SPEP, UPEP  Lab Results  Component Value Date   WBC 4.7 12/19/2016   NEUTROABS 2.5 12/19/2016   HGB 11.5 (L) 12/19/2016   HCT  35.6 12/19/2016   MCV 90.9 12/19/2016   PLT 450 (H) 12/19/2016      Chemistry      Component Value Date/Time   NA 135 12/19/2016 0952   NA 138 06/02/2014 0910   K 4.0 12/19/2016 0952   K 3.7 06/02/2014 0910   CL 103 12/19/2016 0952   CL 107 06/02/2014 0910   CO2 25 12/19/2016 0952   CO2 26 06/02/2014 0910   BUN 21 (H) 12/19/2016 0952   BUN 17 06/02/2014 0910   CREATININE 0.82 12/19/2016 0952   CREATININE 1.00 06/02/2014 0910      Component Value Date/Time   CALCIUM 8.8 (L) 12/19/2016 0952   CALCIUM 8.6 (L) 06/02/2014 0910   ALKPHOS 55 12/19/2016 0952   ALKPHOS 46 06/02/2014 0910   AST 25 12/19/2016 0952   AST 23 06/02/2014 0910   ALT 13 (L) 12/19/2016 0952   ALT 10 (L) 06/02/2014 0910   BILITOT 0.6 12/19/2016 0952   BILITOT 0.5 06/02/2014 0910     IMPRESSION: 1. No interval change from CT 07/30/2016. 2. Stable low-density necrotic lymph nodes in the RIGHT inguinal region. 3. Post hysterectomy. No evidence of local GYN malignancy recurrence   Electronically Signed   By: Suzy Bouchard M.D.   On: 11/27/2016 14:29 RADIOGRAPHIC STUDIES: I have personally reviewed the radiological images as listed and agreed with the findings in the report. No results found.   ASSESSMENT & PLAN:  Malignant neoplasm of right fallopian tube [facility]) Metastatic fallopian tube cancer- Currently on Avastin since February 2017. OCT 22nd CT scan- STABLE Lymphadenopathy in right groin [increase by few mm]; otherwise no evidence of disease anywhere else. No clinical progression noted.  #Hold Avastin-given progressive headaches/elevated blood pressure.  Discussed that the alternative options would be chemotherapy.  Patient is not too keen on chemotherapy.  #Blood pressure elevated-140s over 90s; recommend close monitoring; keep a log of  blood pressure to inform us if continues to be elevated.  Patient will need to have adjustment of her blood pressure medications.  # Right LE swelling-  Likely secondary to inguinal adenopathy/ recommend stockings leg elevation. Lasix only as needed.   # Headaches- all week/improved withtylenol.  Likely related to Avastin.  If not improved after holding would recommend MRI of the brain.  # Chronic UTIs-Continue Macrobid 100 mg once a day.   # follow up in 3 weeks /labs/ ca-125 Avastin infusion. HOLD infusion today.    Orders Placed This Encounter  Procedures  . Urinalysis, Complete w Microscopic    Standing Status:   Future    Number of Occurrences:   1    Standing Expiration Date:   01/18/2017      [name], MD 12/20/2016 9:51 PM

## 2016-12-19 NOTE — Assessment & Plan Note (Addendum)
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 OCT 22nd CT scan- STABLE Lymphadenopathy in right groin [increase by few mm]; otherwise no evidence of disease anywhere else. No clinical progression noted.  #Hold Avastin-given progressive headaches/elevated blood pressure.  Discussed that the alternative options would be chemotherapy.  Patient is not too keen on chemotherapy.  #Blood pressure elevated-140s over 90s; recommend close monitoring; keep a log of blood pressure to inform us if continues to be elevated.  Patient will need to have adjustment of her blood pressure medications.  # Right LE swelling- Likely secondary to inguinal adenopathy/ recommend stockings leg elevation. Lasix only as needed.   # Headaches- all week/improved withtylenol.  Likely related to Avastin.  If not improved after holding would recommend MRI of the brain.  # Chronic UTIs-Continue Macrobid 100 mg once a day.   # follow up in 3 weeks /labs/ ca-125 Avastin infusion. HOLD infusion today.

## 2016-12-20 LAB — CA 125: CANCER ANTIGEN (CA) 125: 28.4 U/mL (ref 0.0–38.1)

## 2017-01-09 ENCOUNTER — Inpatient Hospital Stay: Payer: Medicare HMO | Attending: Internal Medicine | Admitting: Internal Medicine

## 2017-01-09 ENCOUNTER — Inpatient Hospital Stay: Payer: Medicare HMO | Admitting: *Deleted

## 2017-01-09 ENCOUNTER — Inpatient Hospital Stay: Payer: Medicare HMO

## 2017-01-09 ENCOUNTER — Telehealth: Payer: Self-pay | Admitting: Internal Medicine

## 2017-01-09 ENCOUNTER — Other Ambulatory Visit: Payer: Self-pay

## 2017-01-09 VITALS — BP 135/79 | HR 69 | Temp 97.9°F | Resp 18 | Ht 60.0 in | Wt 127.8 lb

## 2017-01-09 DIAGNOSIS — Z90722 Acquired absence of ovaries, bilateral: Secondary | ICD-10-CM | POA: Diagnosis not present

## 2017-01-09 DIAGNOSIS — C774 Secondary and unspecified malignant neoplasm of inguinal and lower limb lymph nodes: Secondary | ICD-10-CM | POA: Diagnosis not present

## 2017-01-09 DIAGNOSIS — C5700 Malignant neoplasm of unspecified fallopian tube: Secondary | ICD-10-CM

## 2017-01-09 DIAGNOSIS — Z923 Personal history of irradiation: Secondary | ICD-10-CM | POA: Diagnosis not present

## 2017-01-09 DIAGNOSIS — C5701 Malignant neoplasm of right fallopian tube: Secondary | ICD-10-CM

## 2017-01-09 DIAGNOSIS — Z86718 Personal history of other venous thrombosis and embolism: Secondary | ICD-10-CM

## 2017-01-09 DIAGNOSIS — R600 Localized edema: Secondary | ICD-10-CM | POA: Diagnosis not present

## 2017-01-09 DIAGNOSIS — Z7901 Long term (current) use of anticoagulants: Secondary | ICD-10-CM | POA: Diagnosis not present

## 2017-01-09 DIAGNOSIS — R590 Localized enlarged lymph nodes: Secondary | ICD-10-CM | POA: Diagnosis not present

## 2017-01-09 DIAGNOSIS — Z79899 Other long term (current) drug therapy: Secondary | ICD-10-CM

## 2017-01-09 DIAGNOSIS — N3281 Overactive bladder: Secondary | ICD-10-CM | POA: Diagnosis not present

## 2017-01-09 DIAGNOSIS — M79604 Pain in right leg: Secondary | ICD-10-CM | POA: Insufficient documentation

## 2017-01-09 DIAGNOSIS — Z8041 Family history of malignant neoplasm of ovary: Secondary | ICD-10-CM | POA: Diagnosis not present

## 2017-01-09 DIAGNOSIS — R51 Headache: Secondary | ICD-10-CM | POA: Diagnosis not present

## 2017-01-09 DIAGNOSIS — Z90710 Acquired absence of both cervix and uterus: Secondary | ICD-10-CM | POA: Diagnosis not present

## 2017-01-09 DIAGNOSIS — Z9221 Personal history of antineoplastic chemotherapy: Secondary | ICD-10-CM | POA: Diagnosis not present

## 2017-01-09 DIAGNOSIS — Z7982 Long term (current) use of aspirin: Secondary | ICD-10-CM

## 2017-01-09 DIAGNOSIS — K219 Gastro-esophageal reflux disease without esophagitis: Secondary | ICD-10-CM | POA: Diagnosis not present

## 2017-01-09 LAB — CBC WITH DIFFERENTIAL/PLATELET
Basophils Absolute: 0 10*3/uL (ref 0–0.1)
Basophils Relative: 1 %
EOS PCT: 1 %
Eosinophils Absolute: 0 10*3/uL (ref 0–0.7)
HEMATOCRIT: 34.1 % — AB (ref 35.0–47.0)
HEMOGLOBIN: 11.1 g/dL — AB (ref 12.0–16.0)
LYMPHS ABS: 1.3 10*3/uL (ref 1.0–3.6)
LYMPHS PCT: 31 %
MCH: 29.3 pg (ref 26.0–34.0)
MCHC: 32.5 g/dL (ref 32.0–36.0)
MCV: 90.1 fL (ref 80.0–100.0)
MONO ABS: 0.6 10*3/uL (ref 0.2–0.9)
MONOS PCT: 15 %
NEUTROS ABS: 2.2 10*3/uL (ref 1.4–6.5)
Neutrophils Relative %: 52 %
Platelets: 427 10*3/uL (ref 150–440)
RBC: 3.79 MIL/uL — ABNORMAL LOW (ref 3.80–5.20)
RDW: 15.5 % — AB (ref 11.5–14.5)
WBC: 4.3 10*3/uL (ref 3.6–11.0)

## 2017-01-09 LAB — COMPREHENSIVE METABOLIC PANEL
ALBUMIN: 3.5 g/dL (ref 3.5–5.0)
ALK PHOS: 53 U/L (ref 38–126)
ALT: 11 U/L — ABNORMAL LOW (ref 14–54)
AST: 24 U/L (ref 15–41)
Anion gap: 8 (ref 5–15)
BILIRUBIN TOTAL: 0.6 mg/dL (ref 0.3–1.2)
BUN: 22 mg/dL — AB (ref 6–20)
CO2: 26 mmol/L (ref 22–32)
Calcium: 8.6 mg/dL — ABNORMAL LOW (ref 8.9–10.3)
Chloride: 102 mmol/L (ref 101–111)
Creatinine, Ser: 0.8 mg/dL (ref 0.44–1.00)
GFR calc Af Amer: >60 mL/min (ref >60)
GFR calc non Af Amer: >60 mL/min (ref >60)
GLUCOSE: 93 mg/dL (ref 65–99)
POTASSIUM: 3.8 mmol/L (ref 3.5–5.1)
Sodium: 136 mmol/L (ref 135–145)
TOTAL PROTEIN: 6.9 g/dL (ref 6.5–8.1)

## 2017-01-09 MED ORDER — HEPARIN SOD (PORK) LOCK FLUSH 100 UNIT/ML IV SOLN
500.0000 [IU] | Freq: Once | INTRAVENOUS | Status: AC
  Administered 2017-01-09: 500 [IU] via INTRAVENOUS
  Filled 2017-01-09: qty 5

## 2017-01-09 MED ORDER — NITROFURANTOIN MONOHYD MACRO 100 MG PO CAPS: 100.0000 mg | ORAL_CAPSULE | Freq: Every day | ORAL | 3 refills | Status: DC

## 2017-01-09 NOTE — Progress Notes (Signed)
Patient c/o of bilateral lower extremity edema. Right ankle edema is painful to touch per pt. Pt now living at home by herself-states her husband is not living at assisted living.

## 2017-01-09 NOTE — Telephone Encounter (Signed)
I spoke to pt's daughter-regarding switching therapy to Doxil.   Please make appt with ME next week/switch chemo to Doxil. [cancel gem]. No labs needed.

## 2017-01-09 NOTE — Progress Notes (Signed)
START ON PATHWAY REGIMEN - Ovarian     A cycle is every 28 days:     Liposomal doxorubicin      Carboplatin   **Always confirm dose/schedule in your pharmacy ordering system**    Patient Characteristics: Recurrent or Progressive Disease, Second Line Therapy, > 12 Months AJCC T Category: T3c AJCC N Category: N1 AJCC M Category: M1 Therapeutic Status: Recurrent or Progressive Disease AJCC 8 Stage Grouping: IV Line of Therapy: Second Line BRCA Mutation Status: Absent Would you be surprised if this patient died  in the next year<= I would be surprised if this patient died in the next year Progression on neoadjuvant therapy<= No Time since last treatment: > 12 Months Intent of Therapy: Non-Curative / Palliative Intent, Discussed with Patient

## 2017-01-09 NOTE — Telephone Encounter (Signed)
rn Clarified with MD- doxil only

## 2017-01-09 NOTE — Assessment & Plan Note (Addendum)
Metastatic fallopian tube cancer-platinum sensitive;  currently on Avastin since February 2017. OCT 22nd CT scan- STABLE Lymphadenopathy in right groin; however progressive swelling of the right lower extremity noted-concerning for local progression.  Recent ultrasound showed 6 cm lymph node right groin.  #Given the side effects noted on Avastin [headaches/elevated blood pressure]; and local progression clinically-discontinue Avastin. Discussed with Dr. Donella Stade; concerns for worsening lymphedema from radiation.  Recommend systemic therapy.  # Discussed multiple options-carboplatin single agent; Taxol/Abraxane; Doxil and gemcitabine.  Long discussion regarding the side effects/pros and cons of each therapy.   #Blood pressure elevated-140s over 90s-improved off Avastin.    # Headaches- all week/improved withtylenol.  Likely related to Avastin.  Resolved.  # Chronic UTIs-Continue Macrobid 100 mg once a day.   Addendum: After careful review of patient's multiple I would recommend Doxil single agent [August 2018 ejection fraction-2D echo normal.]  # 40 minutes face-to-face with the patient discussing the above plan of care; more than 50% of time spent on prognosis/ natural history; counseling and coordination.

## 2017-01-09 NOTE — Progress Notes (Signed)
Patient on plan of care prior to pathways. 

## 2017-01-09 NOTE — Progress Notes (Signed)
[place] OFFICE PROGRESS NOTE  Patient Care Team: Ezequiel Kayser, [name]  No matching staging information was found for the patient.   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  Adequate TRS, no macroscopic residual. IP/IV chemotherapy with DDP and paclitaxel on GOG protocol, chemo and Bev consolidation completed in 03/2011 2. 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 November 14, 2011- Patient was started on carboplatin and gemcitabine 4. Finished 6 cycles of chemotherapy in April of 2014 with carboplatin and gemcitabine. Tolerance was fairly good except for neutropenia and thrombocytopenia. 5.recurrent disease by CT scan February of 2015  # .radiation therapy to pelvis  May of 2015  7.upper extremity  . and deep vein thrombosis associated with port.(June of 2015) patient started on   [place] had port removed and had   thrombectomy September 06, 2013. # CT DEC 2016- progressing disease in the right inguinal area # FEB-TAXOL-AVASTIN;  # FEB 2017- AVASTIN q 3W; July 2017- CT- Improved  Right Inguinal LN; STOP AVASTIN sec to potential concerns of AEs  # AUG 2017 3rd- START ZEJULA- declines sec to intol  # OCT 7th CT- STABLE RIGHT INGUINAL LN- RE-START AVASTIN q 3W       Malignant neoplasm of right fallopian tube [facility])     INTERVAL HISTORY:  Nicole Clayton 83 y.o.  female pleasant patient above history of Fallopian tube cancer metastatic currently  Avastin is here Follow-up.  Patient Avastin was held at last visit approximately 3 weeks ago because of ongoing headaches and/elevated blood pressure.  Patient's headaches have resolved; blood pressure is better controlled.  However she is noted to have worsening swelling in the right lower extremity; complains of tenderness of the right leg.  She also complains of pain in the bottom  of the right leg  Denies any shortness of breath or chest pain. No fevers. No unusual back pain or chills.  Denies any abdominal distention. Denies any chest pain. No nausea no vomiting.   REVIEW OF SYSTEMS:  A complete 10 point review of system is done which is negative except mentioned above/history of present illness.   PAST MEDICAL HISTORY :  Past Medical History:  Diagnosis Date  . Bladder infection, chronic 10/19/2011  . Cancer (HCC)    Ovarian   . Carcinoma of fallopian tube ([place]) 10/05/2009   Overview:  Overview:  Overview:  Drs. Claiborne Rigg and Delorise Shiner Choksi Overview:  Drs. Claiborne Rigg and [employer]   . Concussion   . GERD (gastroesophageal reflux disease)   . MI (mitral incompetence) 10/27/2014   Overview:  MODERATE   . Mitral valve prolapse   . OAB (overactive bladder)   . Tachycardia     PAST SURGICAL HISTORY :   Past Surgical History:  Procedure Laterality Date  . ABDOMINAL HYSTERECTOMY    . CHOLECYSTECTOMY    . LAPAROSCOPIC BILATERAL SALPINGO OOPHERECTOMY  2011  . ovarian cancer surgery  2011    FAMILY HISTORY :   Family History  Problem Relation Age of Onset  . Ovarian cancer Other   . Breast cancer Neg Hx   . Colon cancer Neg Hx   . Diabetes Neg Hx   . Heart disease Neg Hx   . Kidney disease Neg Hx   . Bladder Cancer Neg Hx     SOCIAL HISTORY:   Social History  Tobacco Use  . Smoking status: Never Smoker  . Smokeless tobacco: Never Used  Substance Use Topics  . Alcohol use: No  . Drug use: No    ALLERGIES:  is allergic to benadryl [diphenhydramine]; tamsulosin; alendronate sodium; duloxetine; duloxetine hcl; risedronate sodium; lovastatin; and sulfa antibiotics.  MEDICATIONS:  Current Outpatient Medications  Medication Sig Dispense Refill  . Ascorbic Acid (VITAMIN C) 100 MG tablet Take 100 mg by mouth daily.    Marland Kitchen aspirin EC 81 MG tablet Take 81 mg by mouth.    . cetirizine (ZYRTEC) 10 MG tablet Take 10 mg by mouth daily.     .  cholecalciferol (VITAMIN D) 1000 units tablet Take 1,000 Units by mouth daily.     . fluticasone (FLONASE) 50 MCG/ACT nasal spray Place 1 spray into both nostrils daily.     Marland Kitchen gabapentin (NEURONTIN) 600 MG tablet TAKE (1) TABLET BY MOUTH THREE TIMES A DAY (Patient taking differently: Take 600 mg by mouth 2 (two) times daily as needed. TAKE (1) TABLET BY MOUTH THREE TIMES A DAY) 180 tablet 0  . nitrofurantoin, macrocrystal-monohydrate, (MACROBID) 100 MG capsule Take 1 capsule (100 mg total) by mouth daily. 30 capsule 3  . ondansetron (ZOFRAN) 4 MG tablet Take 4 mg by mouth every 8 (eight) hours as needed for nausea or vomiting.    . pantoprazole (PROTONIX) 40 MG tablet Take 40 mg by mouth daily.     Marland Kitchen acetaminophen (TYLENOL) 650 MG CR tablet Take 650 mg by mouth every 8 (eight) hours as needed for pain.    Marland Kitchen docusate sodium (COLACE) 100 MG capsule Take 100 mg by mouth daily as needed for mild constipation.     . naproxen sodium (ANAPROX) 220 MG tablet Take 220 mg by mouth 2 (two) times daily with a meal.     No current facility-administered medications for this visit.    Facility-Administered Medications Ordered in Other Visits  Medication Dose Route Frequency Provider Last Rate Last Dose  . diphenhydrAMINE (BENADRYL) injection 50 mg  50 mg Intravenous Once Choksi, Janak, MD      . sodium chloride 0.9 % injection 10 mL  10 mL Intravenous PRN Forest Gleason, MD   10 mL at 01/12/15 1116  . sodium chloride 0.9 % injection 10 mL  10 mL Intravenous PRN Forest Gleason, MD   10 mL at 02/16/15 1025    PHYSICAL EXAMINATION: ECOG PERFORMANCE STATUS: 2 - Symptomatic, <50% confined to bed  BP 135/79 (BP Location: Left Arm, Patient Position: Sitting)   Pulse 69   Temp 97.9 F (36.6 C) (Tympanic)   Resp 18   Ht 5' (1.524 m)   Wt 127 lb 12.8 oz (58 kg)   BMI 24.96 kg/m   Filed Weights   01/09/17 1026  Weight: 127 lb 12.8 oz (58 kg)    GENERAL: Well-nourished well-developed; Alert, no distress and  comfortable.  She is walking herself. Accompanied by daughter-in-law.  She walks the rolling walker. EYES: no pallor or icterus OROPHARYNX: no thrush or ulceration; good dentition  NECK: supple, no masses felt LYMPH:  no palpable lymphadenopathy in the cervical, axillary; Right ingiunal LN ~3 cm in size.  LUNGS: clear to auscultation and  No wheeze or crackles HEART/CVS: regular rate & rhythm and no murmurs; chronic right lower extremity swelling- worse today.  Pulses intact. ABDOMEN:abdomen soft, non-tender and normal bowel sounds Musculoskeletal:no cyanosis of digits and no clubbing  PSYCH: alert & oriented x 3 with fluent speech NEURO: no  focal motor/sensory deficits SKIN:  no rashes or significant lesions-except for a callus bottom of the right foot.  LABORATORY DATA:  I have reviewed the data as listed    Component Value Date/Time   NA 136 01/09/2017 1001   NA 138 06/02/2014 0910   K 3.8 01/09/2017 1001   K 3.7 06/02/2014 0910   CL 102 01/09/2017 1001   CL 107 06/02/2014 0910   CO2 26 01/09/2017 1001   CO2 26 06/02/2014 0910   GLUCOSE 93 01/09/2017 1001   GLUCOSE 106 (H) 06/02/2014 0910   BUN 22 (H) 01/09/2017 1001   BUN 17 06/02/2014 0910   CREATININE 0.80 01/09/2017 1001   CREATININE 1.00 06/02/2014 0910   CALCIUM 8.6 (L) 01/09/2017 1001   CALCIUM 8.6 (L) 06/02/2014 0910   PROT 6.9 01/09/2017 1001   PROT 6.3 (L) 06/02/2014 0910   ALBUMIN 3.5 01/09/2017 1001   ALBUMIN 3.4 (L) 06/02/2014 0910   AST 24 01/09/2017 1001   AST 23 06/02/2014 0910   ALT 11 (L) 01/09/2017 1001   ALT 10 (L) 06/02/2014 0910   ALKPHOS 53 01/09/2017 1001   ALKPHOS 46 06/02/2014 0910   BILITOT 0.6 01/09/2017 1001   BILITOT 0.5 06/02/2014 0910   GFRNONAA >60 01/09/2017 1001   GFRNONAA 52 (L) 06/02/2014 0910   GFRAA >60 01/09/2017 1001   GFRAA >60 06/02/2014 0910    No results found for: SPEP, UPEP  Lab Results  Component Value Date   WBC 4.3 01/09/2017   NEUTROABS 2.2 01/09/2017   HGB  11.1 (L) 01/09/2017   HCT 34.1 (L) 01/09/2017   MCV 90.1 01/09/2017   PLT 427 01/09/2017      Chemistry      Component Value Date/Time   NA 136 01/09/2017 1001   NA 138 06/02/2014 0910   K 3.8 01/09/2017 1001   K 3.7 06/02/2014 0910   CL 102 01/09/2017 1001   CL 107 06/02/2014 0910   CO2 26 01/09/2017 1001   CO2 26 06/02/2014 0910   BUN 22 (H) 01/09/2017 1001   BUN 17 06/02/2014 0910   CREATININE 0.80 01/09/2017 1001   CREATININE 1.00 06/02/2014 0910      Component Value Date/Time   CALCIUM 8.6 (L) 01/09/2017 1001   CALCIUM 8.6 (L) 06/02/2014 0910   ALKPHOS 53 01/09/2017 1001   ALKPHOS 46 06/02/2014 0910   AST 24 01/09/2017 1001   AST 23 06/02/2014 0910   ALT 11 (L) 01/09/2017 1001   ALT 10 (L) 06/02/2014 0910   BILITOT 0.6 01/09/2017 1001   BILITOT 0.5 06/02/2014 0910     IMPRESSION: 1. No interval change from CT 07/30/2016. 2. Stable low-density necrotic lymph nodes in the RIGHT inguinal region. 3. Post hysterectomy. No evidence of local GYN malignancy recurrence   Electronically Signed   By: Suzy Bouchard M.D.   On: 11/27/2016 14:29 RADIOGRAPHIC STUDIES: I have personally reviewed the radiological images as listed and agreed with the findings in the report. No results found.   ASSESSMENT & PLAN:  Malignant neoplasm of right fallopian tube [facility]) Metastatic fallopian tube cancer-platinum sensitive;  currently on Avastin since February 2017. OCT 22nd CT scan- STABLE Lymphadenopathy in right groin; however progressive swelling of the right lower extremity noted-concerning for local progression.  Recent ultrasound showed 6 cm lymph node right groin.  #Given the side effects noted on Avastin [headaches/elevated blood pressure]; and local progression clinically-discontinue Avastin. Discussed with Dr. Donella Stade; concerns for worsening lymphedema from radiation.  Recommend systemic  therapy.  # Discussed multiple options-carboplatin single agent; Taxol/Abraxane;  Doxil and gemcitabine.  Long discussion regarding the side effects/pros and cons of each therapy.   #Blood pressure elevated-140s over 90s-improved off Avastin.    # Headaches- all week/improved withtylenol.  Likely related to Avastin.  Resolved.  # Chronic UTIs-Continue Macrobid 100 mg once a day.   # follow up in 2 weeks /labs-Gem; gem 1 week;    Orders Placed This Encounter  Procedures  . CBC with Differential    Standing Status:   Future    Standing Expiration Date:   01/09/2018  . Comprehensive metabolic panel    Standing Status:   Future    Standing Expiration Date:   01/09/2018      [name], MD 01/09/2017 2:32 PM

## 2017-01-10 DIAGNOSIS — S060X0A Concussion without loss of consciousness, initial encounter: Secondary | ICD-10-CM | POA: Diagnosis not present

## 2017-01-10 DIAGNOSIS — K449 Diaphragmatic hernia without obstruction or gangrene: Secondary | ICD-10-CM | POA: Diagnosis not present

## 2017-01-10 DIAGNOSIS — I10 Essential (primary) hypertension: Secondary | ICD-10-CM | POA: Diagnosis not present

## 2017-01-10 DIAGNOSIS — Z8544 Personal history of malignant neoplasm of other female genital organs: Secondary | ICD-10-CM | POA: Diagnosis not present

## 2017-01-10 DIAGNOSIS — C5701 Malignant neoplasm of right fallopian tube: Secondary | ICD-10-CM | POA: Diagnosis not present

## 2017-01-10 DIAGNOSIS — M7989 Other specified soft tissue disorders: Secondary | ICD-10-CM | POA: Diagnosis not present

## 2017-01-10 DIAGNOSIS — R42 Dizziness and giddiness: Secondary | ICD-10-CM | POA: Diagnosis not present

## 2017-01-10 DIAGNOSIS — S0990XA Unspecified injury of head, initial encounter: Secondary | ICD-10-CM | POA: Diagnosis not present

## 2017-01-10 DIAGNOSIS — M6281 Muscle weakness (generalized): Secondary | ICD-10-CM | POA: Diagnosis not present

## 2017-01-10 DIAGNOSIS — R600 Localized edema: Secondary | ICD-10-CM | POA: Diagnosis not present

## 2017-01-10 DIAGNOSIS — D519 Vitamin B12 deficiency anemia, unspecified: Secondary | ICD-10-CM | POA: Diagnosis not present

## 2017-01-10 DIAGNOSIS — M6250 Muscle wasting and atrophy, not elsewhere classified, unspecified site: Secondary | ICD-10-CM | POA: Diagnosis not present

## 2017-01-10 DIAGNOSIS — E782 Mixed hyperlipidemia: Secondary | ICD-10-CM | POA: Diagnosis not present

## 2017-01-10 DIAGNOSIS — M19042 Primary osteoarthritis, left hand: Secondary | ICD-10-CM | POA: Diagnosis not present

## 2017-01-10 DIAGNOSIS — N390 Urinary tract infection, site not specified: Secondary | ICD-10-CM | POA: Diagnosis not present

## 2017-01-10 DIAGNOSIS — R4789 Other speech disturbances: Secondary | ICD-10-CM | POA: Diagnosis not present

## 2017-01-10 DIAGNOSIS — S199XXA Unspecified injury of neck, initial encounter: Secondary | ICD-10-CM | POA: Diagnosis not present

## 2017-01-10 DIAGNOSIS — I471 Supraventricular tachycardia: Secondary | ICD-10-CM | POA: Diagnosis not present

## 2017-01-10 DIAGNOSIS — Z5189 Encounter for other specified aftercare: Secondary | ICD-10-CM | POA: Diagnosis not present

## 2017-01-10 DIAGNOSIS — N8110 Cystocele, unspecified: Secondary | ICD-10-CM | POA: Diagnosis not present

## 2017-01-10 DIAGNOSIS — K900 Celiac disease: Secondary | ICD-10-CM | POA: Diagnosis not present

## 2017-01-10 DIAGNOSIS — D099 Carcinoma in situ, unspecified: Secondary | ICD-10-CM | POA: Diagnosis not present

## 2017-01-10 DIAGNOSIS — Z8673 Personal history of transient ischemic attack (TIA), and cerebral infarction without residual deficits: Secondary | ICD-10-CM | POA: Diagnosis not present

## 2017-01-10 DIAGNOSIS — G459 Transient cerebral ischemic attack, unspecified: Secondary | ICD-10-CM | POA: Diagnosis not present

## 2017-01-10 DIAGNOSIS — E642 Sequelae of vitamin C deficiency: Secondary | ICD-10-CM | POA: Diagnosis not present

## 2017-01-10 DIAGNOSIS — N3020 Other chronic cystitis without hematuria: Secondary | ICD-10-CM | POA: Diagnosis not present

## 2017-01-10 DIAGNOSIS — W19XXXA Unspecified fall, initial encounter: Secondary | ICD-10-CM | POA: Diagnosis not present

## 2017-01-10 DIAGNOSIS — G620 Drug-induced polyneuropathy: Secondary | ICD-10-CM | POA: Diagnosis not present

## 2017-01-10 DIAGNOSIS — M810 Age-related osteoporosis without current pathological fracture: Secondary | ICD-10-CM | POA: Diagnosis not present

## 2017-01-10 DIAGNOSIS — I2699 Other pulmonary embolism without acute cor pulmonale: Secondary | ICD-10-CM | POA: Diagnosis not present

## 2017-01-10 DIAGNOSIS — G603 Idiopathic progressive neuropathy: Secondary | ICD-10-CM | POA: Diagnosis not present

## 2017-01-10 DIAGNOSIS — R29810 Facial weakness: Secondary | ICD-10-CM | POA: Diagnosis not present

## 2017-01-10 DIAGNOSIS — I2609 Other pulmonary embolism with acute cor pulmonale: Secondary | ICD-10-CM | POA: Diagnosis not present

## 2017-01-10 DIAGNOSIS — T451X5A Adverse effect of antineoplastic and immunosuppressive drugs, initial encounter: Secondary | ICD-10-CM | POA: Diagnosis not present

## 2017-01-10 DIAGNOSIS — W1839XA Other fall on same level, initial encounter: Secondary | ICD-10-CM | POA: Diagnosis not present

## 2017-01-10 DIAGNOSIS — K219 Gastro-esophageal reflux disease without esophagitis: Secondary | ICD-10-CM | POA: Diagnosis not present

## 2017-01-10 DIAGNOSIS — R51 Headache: Secondary | ICD-10-CM | POA: Diagnosis not present

## 2017-01-10 LAB — CA 125: CANCER ANTIGEN (CA) 125: 27.9 U/mL (ref 0.0–38.1)

## 2017-01-11 ENCOUNTER — Telehealth: Payer: Self-pay | Admitting: Internal Medicine

## 2017-01-11 NOTE — Telephone Encounter (Signed)
Spoke to Dr at [facility]; patient admitted to hospital for question syncope.  Acute PE; also workup for TIA.  On Lovenox.  FYI

## 2017-01-14 MED ORDER — VITAMIN C 500 MG PO TABS
500.00 | ORAL_TABLET | ORAL | Status: DC
Start: 2017-01-15 — End: 2017-01-14

## 2017-01-14 MED ORDER — LIDOCAINE HCL 1 % IJ SOLN
.50 | INTRAMUSCULAR | Status: DC
Start: ? — End: 2017-01-14

## 2017-01-14 MED ORDER — GABAPENTIN 300 MG PO CAPS
600.00 | ORAL_CAPSULE | ORAL | Status: DC
Start: 2017-01-15 — End: 2017-01-14

## 2017-01-14 MED ORDER — PANTOPRAZOLE SODIUM 40 MG PO TBEC
40.00 | DELAYED_RELEASE_TABLET | ORAL | Status: DC
Start: 2017-01-15 — End: 2017-01-14

## 2017-01-14 MED ORDER — CHOLECALCIFEROL 25 MCG (1000 UT) PO TABS
1000.00 | ORAL_TABLET | ORAL | Status: DC
Start: 2017-01-15 — End: 2017-01-14

## 2017-01-14 MED ORDER — ENOXAPARIN SODIUM 60 MG/0.6ML ~~LOC~~ SOLN
50.00 | SUBCUTANEOUS | Status: DC
Start: 2017-01-14 — End: 2017-01-14

## 2017-01-14 MED ORDER — ACETAMINOPHEN 325 MG PO TABS
650.00 | ORAL_TABLET | ORAL | Status: DC
Start: ? — End: 2017-01-14

## 2017-01-14 MED ORDER — METOPROLOL SUCCINATE ER 50 MG PO TB24
50.00 | ORAL_TABLET | ORAL | Status: DC
Start: 2017-01-15 — End: 2017-01-14

## 2017-01-14 MED ORDER — VITAMIN B-12 500 MCG PO TABS
500.00 | ORAL_TABLET | ORAL | Status: DC
Start: 2017-01-15 — End: 2017-01-14

## 2017-01-14 MED ORDER — NITROFURANTOIN MONOHYD MACRO 100 MG PO CAPS
100.00 | ORAL_CAPSULE | ORAL | Status: DC
Start: 2017-01-15 — End: 2017-01-14

## 2017-01-15 ENCOUNTER — Telehealth: Payer: Self-pay | Admitting: *Deleted

## 2017-01-15 NOTE — Telephone Encounter (Signed)
-----   Message from [place] sent at 01/15/2017 11:01 AM EST ----- Regarding: ICU Pt canceled appt tomorrow-ICU at [facility] and then headed to [facility]-thx!

## 2017-01-16 ENCOUNTER — Inpatient Hospital Stay: Payer: Medicare HMO

## 2017-01-16 ENCOUNTER — Telehealth: Payer: Self-pay | Admitting: *Deleted

## 2017-01-16 ENCOUNTER — Ambulatory Visit: Payer: Medicare HMO

## 2017-01-16 DIAGNOSIS — C5701 Malignant neoplasm of right fallopian tube: Secondary | ICD-10-CM | POA: Diagnosis not present

## 2017-01-16 DIAGNOSIS — N3020 Other chronic cystitis without hematuria: Secondary | ICD-10-CM | POA: Diagnosis not present

## 2017-01-16 DIAGNOSIS — M6281 Muscle weakness (generalized): Secondary | ICD-10-CM | POA: Diagnosis not present

## 2017-01-16 DIAGNOSIS — K5900 Constipation, unspecified: Secondary | ICD-10-CM | POA: Diagnosis not present

## 2017-01-16 DIAGNOSIS — I2609 Other pulmonary embolism with acute cor pulmonale: Secondary | ICD-10-CM | POA: Diagnosis not present

## 2017-01-16 DIAGNOSIS — E642 Sequelae of vitamin C deficiency: Secondary | ICD-10-CM | POA: Diagnosis not present

## 2017-01-16 DIAGNOSIS — D099 Carcinoma in situ, unspecified: Secondary | ICD-10-CM | POA: Diagnosis not present

## 2017-01-16 DIAGNOSIS — Z7901 Long term (current) use of anticoagulants: Secondary | ICD-10-CM | POA: Diagnosis not present

## 2017-01-16 DIAGNOSIS — J309 Allergic rhinitis, unspecified: Secondary | ICD-10-CM | POA: Diagnosis not present

## 2017-01-16 DIAGNOSIS — M7989 Other specified soft tissue disorders: Secondary | ICD-10-CM | POA: Diagnosis not present

## 2017-01-16 DIAGNOSIS — W19XXXA Unspecified fall, initial encounter: Secondary | ICD-10-CM | POA: Diagnosis not present

## 2017-01-16 DIAGNOSIS — D519 Vitamin B12 deficiency anemia, unspecified: Secondary | ICD-10-CM | POA: Diagnosis not present

## 2017-01-16 DIAGNOSIS — K219 Gastro-esophageal reflux disease without esophagitis: Secondary | ICD-10-CM | POA: Diagnosis not present

## 2017-01-16 DIAGNOSIS — G603 Idiopathic progressive neuropathy: Secondary | ICD-10-CM | POA: Diagnosis not present

## 2017-01-16 DIAGNOSIS — N390 Urinary tract infection, site not specified: Secondary | ICD-10-CM | POA: Diagnosis not present

## 2017-01-16 DIAGNOSIS — G609 Hereditary and idiopathic neuropathy, unspecified: Secondary | ICD-10-CM | POA: Diagnosis not present

## 2017-01-16 DIAGNOSIS — I471 Supraventricular tachycardia: Secondary | ICD-10-CM | POA: Diagnosis not present

## 2017-01-16 DIAGNOSIS — Z5189 Encounter for other specified aftercare: Secondary | ICD-10-CM | POA: Diagnosis not present

## 2017-01-16 DIAGNOSIS — M6250 Muscle wasting and atrophy, not elsewhere classified, unspecified site: Secondary | ICD-10-CM | POA: Diagnosis not present

## 2017-01-16 DIAGNOSIS — I2699 Other pulmonary embolism without acute cor pulmonale: Secondary | ICD-10-CM | POA: Diagnosis not present

## 2017-01-16 DIAGNOSIS — I10 Essential (primary) hypertension: Secondary | ICD-10-CM | POA: Diagnosis not present

## 2017-01-16 DIAGNOSIS — R600 Localized edema: Secondary | ICD-10-CM | POA: Diagnosis not present

## 2017-01-16 NOTE — Telephone Encounter (Signed)
-----   Message from [place] sent at 01/16/2017 11:07 AM EST ----- Regarding: cancel Contact: [phone] Daughter called to cancel appt today but would like a callback on what we do now?  thx

## 2017-01-16 NOTE — Telephone Encounter (Signed)
Returned daughter- Sharon's phone call. Patient "is very weak. I'm unsure when she will be able to even get her chemotherapy treatments. Mom is currently at [facility] and I'm not sure she can even handle anymore treatment. Mom had blood clots in her lung. [facility] wanted her on the Lovenox injection and momma refused to be on anything but oral anticoagulants. I'm just not sure if she can tolerate the doxil. I don't know if momma could go back on Avastin if that is even an option. I don't know if she can even tolerate that. When all this happened, my mom had an ENT that lived right beside our house within seconds. He was able to call 911 and promptly assess momma for a stroke. I feel that she needs a treatment break I've also discussed this with my other family members. They want to hold off on any chemotherapy at this time." daughter-Sharon would like to talk to Dr. Rogue Bussing if possible about these concerns. I explained to her that the request for a treatment break is certainly reasonable. Daughter states that her "mom's qualify of life is important. I want to see my momma get her strength back. She is just weak and anything else we do right now may set her back." I explained sharon that most likely Dr. Jacinto Reap may not want pt to go back on the Avastin given the side effects of elevated bp, leg edema and headaches and recent PE. I stated that I would advocate for the family's request to hold off on the treatment at this time. I will speak to Dr. Rogue Bussing about their concerns. She thanked me for providing active listening and I reassured [name] that I would touch base with her tomorrow (after I speak with Dr. B) regarding the apt schedule for next week.

## 2017-01-17 ENCOUNTER — Telehealth: Payer: Self-pay | Admitting: Internal Medicine

## 2017-01-17 MED ORDER — APIXABAN 5 MG PO TABS: 5.0000 mg | ORAL_TABLET | Freq: Two times a day (BID) | ORAL | 5 refills | Status: DC

## 2017-01-17 NOTE — Telephone Encounter (Signed)
I contacted [place], RN at [employer] and gave her the verbal instructions per Dr. Rogue Bussing. [place] verbalized understanding and teach back process was performed.  Eliquis rx was faxed to [facility] also at [phone].

## 2017-01-17 NOTE — Telephone Encounter (Signed)
I agree with family/ pt-okay with holding of treatments this time.  She can follow-up with me in the next in apx 4 weeks [if interested]/CBC BMP/ ca-125.  Thank you Nira Conn- for talking to pt's daughter.

## 2017-01-17 NOTE — Telephone Encounter (Signed)
Spoke to [employer]; -patient interested in oral pills rather than injectables; Discussed that Lovenox is the best option for PE with malignancy; however alternative novel anticoagulants is reasonable. Will start pt on eliquis 5 mg BID [given age/weight].  #Last dose of Lovenox 12/14 night; starting 12/15-patient will start Eliquis. Will send instructions to [facility].    # Wants to sister- [facility] in [place] to handle follow up appts in 4 weeks or so/labs-cbc/cmp/ca-125.

## 2017-01-19 DIAGNOSIS — Z7901 Long term (current) use of anticoagulants: Secondary | ICD-10-CM | POA: Diagnosis not present

## 2017-01-19 DIAGNOSIS — M6281 Muscle weakness (generalized): Secondary | ICD-10-CM | POA: Diagnosis not present

## 2017-01-19 DIAGNOSIS — N3020 Other chronic cystitis without hematuria: Secondary | ICD-10-CM | POA: Diagnosis not present

## 2017-01-19 DIAGNOSIS — K219 Gastro-esophageal reflux disease without esophagitis: Secondary | ICD-10-CM | POA: Diagnosis not present

## 2017-01-19 DIAGNOSIS — I10 Essential (primary) hypertension: Secondary | ICD-10-CM | POA: Diagnosis not present

## 2017-01-19 DIAGNOSIS — I2699 Other pulmonary embolism without acute cor pulmonale: Secondary | ICD-10-CM | POA: Diagnosis not present

## 2017-01-19 DIAGNOSIS — G609 Hereditary and idiopathic neuropathy, unspecified: Secondary | ICD-10-CM | POA: Diagnosis not present

## 2017-01-19 DIAGNOSIS — C5701 Malignant neoplasm of right fallopian tube: Secondary | ICD-10-CM | POA: Diagnosis not present

## 2017-01-21 ENCOUNTER — Encounter: Payer: Medicare HMO | Admitting: Obstetrics and Gynecology

## 2017-01-24 ENCOUNTER — Inpatient Hospital Stay: Payer: Medicare HMO

## 2017-01-24 ENCOUNTER — Inpatient Hospital Stay: Payer: Medicare HMO | Admitting: Internal Medicine

## 2017-01-24 NOTE — Progress Notes (Deleted)
[place] OFFICE PROGRESS NOTE  Patient Care Team: Ezequiel Kayser, [name]  No matching staging information was found for the patient.   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  Adequate TRS, no macroscopic residual. IP/IV chemotherapy with DDP and paclitaxel on GOG protocol, chemo and Bev consolidation completed in 03/2011 2. 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 November 14, 2011- Patient was started on carboplatin and gemcitabine 4. Finished 6 cycles of chemotherapy in April of 2014 with carboplatin and gemcitabine. Tolerance was fairly good except for neutropenia and thrombocytopenia. 5.recurrent disease by CT scan February of 2015  # .radiation therapy to pelvis  May of 2015  7.upper extremity  . and deep vein thrombosis associated with port.(June of 2015) patient started on   [place] had port removed and had   thrombectomy September 06, 2013. # CT DEC 2016- progressing disease in the right inguinal area # FEB-TAXOL-AVASTIN;  # FEB 2017- AVASTIN q 3W; July 2017- CT- Improved  Right Inguinal LN; STOP AVASTIN sec to potential concerns of AEs  # AUG 2017 3rd- START ZEJULA- declines sec to intol  # OCT 7th CT- STABLE RIGHT INGUINAL LN- RE-START AVASTIN q 3W       Malignant neoplasm of right fallopian tube [facility])     INTERVAL HISTORY:  Nicole Clayton 83 y.o.  female pleasant patient above history of Fallopian tube cancer metastatic currently  Avastin is here Follow-up.  Patient Avastin was held at last visit approximately 3 weeks ago because of ongoing headaches and/elevated blood pressure.  Patient's headaches have resolved; blood pressure is better controlled.  However she is noted to have worsening swelling in the right lower extremity; complains of tenderness of the right leg.  She also complains of pain in the bottom  of the right leg  Denies any shortness of breath or chest pain. No fevers. No unusual back pain or chills.  Denies any abdominal distention. Denies any chest pain. No nausea no vomiting.   REVIEW OF SYSTEMS:  A complete 10 point review of system is done which is negative except mentioned above/history of present illness.   PAST MEDICAL HISTORY :  Past Medical History:  Diagnosis Date  . Bladder infection, chronic 10/19/2011  . Cancer (HCC)    Ovarian   . Carcinoma of fallopian tube ([place]) 10/05/2009   Overview:  Overview:  Overview:  Drs. Claiborne Rigg and Delorise Shiner Choksi Overview:  Drs. Claiborne Rigg and [employer]   . Concussion   . GERD (gastroesophageal reflux disease)   . MI (mitral incompetence) 10/27/2014   Overview:  MODERATE   . Mitral valve prolapse   . OAB (overactive bladder)   . Tachycardia     PAST SURGICAL HISTORY :   Past Surgical History:  Procedure Laterality Date  . ABDOMINAL HYSTERECTOMY    . CHOLECYSTECTOMY    . LAPAROSCOPIC BILATERAL SALPINGO OOPHERECTOMY  2011  . ovarian cancer surgery  2011    FAMILY HISTORY :   Family History  Problem Relation Age of Onset  . Ovarian cancer Other   . Breast cancer Neg Hx   . Colon cancer Neg Hx   . Diabetes Neg Hx   . Heart disease Neg Hx   . Kidney disease Neg Hx   . Bladder Cancer Neg Hx     SOCIAL HISTORY:   Social History  Tobacco Use  . Smoking status: Never Smoker  . Smokeless tobacco: Never Used  Substance Use Topics  . Alcohol use: No  . Drug use: No    ALLERGIES:  is allergic to benadryl [diphenhydramine]; tamsulosin; alendronate sodium; duloxetine; duloxetine hcl; risedronate sodium; lovastatin; and sulfa antibiotics.  MEDICATIONS:  Current Outpatient Medications  Medication Sig Dispense Refill  . acetaminophen (TYLENOL) 650 MG CR tablet Take 650 mg by mouth every 8 (eight) hours as needed for pain.    Marland Kitchen apixaban (ELIQUIS) 5 MG TABS tablet Take 1 tablet (5 mg total) by mouth 2 (two) times  daily. 60 tablet 5  . Ascorbic Acid (VITAMIN C) 100 MG tablet Take 100 mg by mouth daily.    Marland Kitchen aspirin EC 81 MG tablet Take 81 mg by mouth.    . cetirizine (ZYRTEC) 10 MG tablet Take 10 mg by mouth daily.     . cholecalciferol (VITAMIN D) 1000 units tablet Take 1,000 Units by mouth daily.     Marland Kitchen docusate sodium (COLACE) 100 MG capsule Take 100 mg by mouth daily as needed for mild constipation.     . fluticasone (FLONASE) 50 MCG/ACT nasal spray Place 1 spray into both nostrils daily.     Marland Kitchen gabapentin (NEURONTIN) 600 MG tablet TAKE (1) TABLET BY MOUTH THREE TIMES A DAY (Patient taking differently: Take 600 mg by mouth 2 (two) times daily as needed. TAKE (1) TABLET BY MOUTH THREE TIMES A DAY) 180 tablet 0  . naproxen sodium (ANAPROX) 220 MG tablet Take 220 mg by mouth 2 (two) times daily with a meal.    . nitrofurantoin, macrocrystal-monohydrate, (MACROBID) 100 MG capsule Take 1 capsule (100 mg total) by mouth daily. 30 capsule 3  . ondansetron (ZOFRAN) 4 MG tablet Take 4 mg by mouth every 8 (eight) hours as needed for nausea or vomiting.    . pantoprazole (PROTONIX) 40 MG tablet Take 40 mg by mouth daily.      No current facility-administered medications for this visit.    Facility-Administered Medications Ordered in Other Visits  Medication Dose Route Frequency Provider Last Rate Last Dose  . diphenhydrAMINE (BENADRYL) injection 50 mg  50 mg Intravenous Once Choksi, Janak, MD      . sodium chloride 0.9 % injection 10 mL  10 mL Intravenous PRN Forest Gleason, MD   10 mL at 01/12/15 1116  . sodium chloride 0.9 % injection 10 mL  10 mL Intravenous PRN Forest Gleason, MD   10 mL at 02/16/15 1025    PHYSICAL EXAMINATION: ECOG PERFORMANCE STATUS: 2 - Symptomatic, <50% confined to bed  There were no vitals taken for this visit.  There were no vitals filed for this visit.  GENERAL: Well-nourished well-developed; Alert, no distress and comfortable.  She is walking herself. Accompanied by  daughter-in-law.  She walks the rolling walker. EYES: no pallor or icterus OROPHARYNX: no thrush or ulceration; good dentition  NECK: supple, no masses felt LYMPH:  no palpable lymphadenopathy in the cervical, axillary; Right ingiunal LN ~3 cm in size.  LUNGS: clear to auscultation and  No wheeze or crackles HEART/CVS: regular rate & rhythm and no murmurs; chronic right lower extremity swelling- worse today.  Pulses intact. ABDOMEN:abdomen soft, non-tender and normal bowel sounds Musculoskeletal:no cyanosis of digits and no clubbing  PSYCH: alert & oriented x 3 with fluent speech NEURO: no focal motor/sensory deficits SKIN:  no rashes or significant lesions-except for a callus bottom of the right foot.  LABORATORY DATA:  I  have reviewed the data as listed    Component Value Date/Time   NA 136 01/09/2017 1001   NA 138 06/02/2014 0910   K 3.8 01/09/2017 1001   K 3.7 06/02/2014 0910   CL 102 01/09/2017 1001   CL 107 06/02/2014 0910   CO2 26 01/09/2017 1001   CO2 26 06/02/2014 0910   GLUCOSE 93 01/09/2017 1001   GLUCOSE 106 (H) 06/02/2014 0910   BUN 22 (H) 01/09/2017 1001   BUN 17 06/02/2014 0910   CREATININE 0.80 01/09/2017 1001   CREATININE 1.00 06/02/2014 0910   CALCIUM 8.6 (L) 01/09/2017 1001   CALCIUM 8.6 (L) 06/02/2014 0910   PROT 6.9 01/09/2017 1001   PROT 6.3 (L) 06/02/2014 0910   ALBUMIN 3.5 01/09/2017 1001   ALBUMIN 3.4 (L) 06/02/2014 0910   AST 24 01/09/2017 1001   AST 23 06/02/2014 0910   ALT 11 (L) 01/09/2017 1001   ALT 10 (L) 06/02/2014 0910   ALKPHOS 53 01/09/2017 1001   ALKPHOS 46 06/02/2014 0910   BILITOT 0.6 01/09/2017 1001   BILITOT 0.5 06/02/2014 0910   GFRNONAA >60 01/09/2017 1001   GFRNONAA 52 (L) 06/02/2014 0910   GFRAA >60 01/09/2017 1001   GFRAA >60 06/02/2014 0910    No results found for: SPEP, UPEP  Lab Results  Component Value Date   WBC 4.3 01/09/2017   NEUTROABS 2.2 01/09/2017   HGB 11.1 (L) 01/09/2017   HCT 34.1 (L) 01/09/2017    MCV 90.1 01/09/2017   PLT 427 01/09/2017      Chemistry      Component Value Date/Time   NA 136 01/09/2017 1001   NA 138 06/02/2014 0910   K 3.8 01/09/2017 1001   K 3.7 06/02/2014 0910   CL 102 01/09/2017 1001   CL 107 06/02/2014 0910   CO2 26 01/09/2017 1001   CO2 26 06/02/2014 0910   BUN 22 (H) 01/09/2017 1001   BUN 17 06/02/2014 0910   CREATININE 0.80 01/09/2017 1001   CREATININE 1.00 06/02/2014 0910      Component Value Date/Time   CALCIUM 8.6 (L) 01/09/2017 1001   CALCIUM 8.6 (L) 06/02/2014 0910   ALKPHOS 53 01/09/2017 1001   ALKPHOS 46 06/02/2014 0910   AST 24 01/09/2017 1001   AST 23 06/02/2014 0910   ALT 11 (L) 01/09/2017 1001   ALT 10 (L) 06/02/2014 0910   BILITOT 0.6 01/09/2017 1001   BILITOT 0.5 06/02/2014 0910     IMPRESSION: 1. No interval change from CT 07/30/2016. 2. Stable low-density necrotic lymph nodes in the RIGHT inguinal region. 3. Post hysterectomy. No evidence of local GYN malignancy recurrence   Electronically Signed   By: Suzy Bouchard M.D.   On: 11/27/2016 14:29 RADIOGRAPHIC STUDIES: I have personally reviewed the radiological images as listed and agreed with the findings in the report. No results found.   ASSESSMENT & PLAN:  No problem-specific Assessment & Plan notes found for this encounter.   No orders of the defined types were placed in this encounter.     [name], MD 01/24/2017 8:59 AM

## 2017-01-24 NOTE — Assessment & Plan Note (Deleted)
Metastatic fallopian tube cancer-platinum sensitive;  currently on Avastin since February 2017. OCT 22nd CT scan- STABLE Lymphadenopathy in right groin; however progressive swelling of the right lower extremity noted-concerning for local progression.  Recent ultrasound showed 6 cm lymph node right groin.  #Given the side effects noted on Avastin [headaches/elevated blood pressure]; and local progression clinically-discontinue Avastin. Discussed with Dr. Donella Stade; concerns for worsening lymphedema from radiation.  Recommend systemic therapy.  # Discussed multiple options-carboplatin single agent; Taxol/Abraxane; Doxil and gemcitabine.  Long discussion regarding the side effects/pros and cons of each therapy.   #Blood pressure elevated-140s over 90s-improved off Avastin.    # Headaches- all week/improved withtylenol.  Likely related to Avastin.  Resolved.  # Chronic UTIs-Continue Macrobid 100 mg once a day.   Addendum: After careful review of patient's multiple I would recommend Doxil single agent [August 2018 ejection fraction-2D echo normal.]

## 2017-01-29 DIAGNOSIS — I471 Supraventricular tachycardia: Secondary | ICD-10-CM | POA: Diagnosis not present

## 2017-01-29 DIAGNOSIS — N3020 Other chronic cystitis without hematuria: Secondary | ICD-10-CM | POA: Diagnosis not present

## 2017-01-29 DIAGNOSIS — G609 Hereditary and idiopathic neuropathy, unspecified: Secondary | ICD-10-CM | POA: Diagnosis not present

## 2017-01-29 DIAGNOSIS — I10 Essential (primary) hypertension: Secondary | ICD-10-CM | POA: Diagnosis not present

## 2017-01-29 DIAGNOSIS — C5701 Malignant neoplasm of right fallopian tube: Secondary | ICD-10-CM | POA: Diagnosis not present

## 2017-01-29 DIAGNOSIS — K5900 Constipation, unspecified: Secondary | ICD-10-CM | POA: Diagnosis not present

## 2017-01-29 DIAGNOSIS — Z7901 Long term (current) use of anticoagulants: Secondary | ICD-10-CM | POA: Diagnosis not present

## 2017-01-29 DIAGNOSIS — K219 Gastro-esophageal reflux disease without esophagitis: Secondary | ICD-10-CM | POA: Diagnosis not present

## 2017-02-05 DIAGNOSIS — Z7901 Long term (current) use of anticoagulants: Secondary | ICD-10-CM | POA: Diagnosis not present

## 2017-02-05 DIAGNOSIS — J309 Allergic rhinitis, unspecified: Secondary | ICD-10-CM | POA: Diagnosis not present

## 2017-02-05 DIAGNOSIS — I471 Supraventricular tachycardia: Secondary | ICD-10-CM | POA: Diagnosis not present

## 2017-02-05 DIAGNOSIS — C5701 Malignant neoplasm of right fallopian tube: Secondary | ICD-10-CM | POA: Diagnosis not present

## 2017-02-05 DIAGNOSIS — G609 Hereditary and idiopathic neuropathy, unspecified: Secondary | ICD-10-CM | POA: Diagnosis not present

## 2017-02-05 DIAGNOSIS — I10 Essential (primary) hypertension: Secondary | ICD-10-CM | POA: Diagnosis not present

## 2017-02-05 DIAGNOSIS — N3020 Other chronic cystitis without hematuria: Secondary | ICD-10-CM | POA: Diagnosis not present

## 2017-02-07 DIAGNOSIS — M1991 Primary osteoarthritis, unspecified site: Secondary | ICD-10-CM | POA: Diagnosis not present

## 2017-02-07 DIAGNOSIS — I10 Essential (primary) hypertension: Secondary | ICD-10-CM | POA: Diagnosis not present

## 2017-02-07 DIAGNOSIS — I471 Supraventricular tachycardia: Secondary | ICD-10-CM | POA: Diagnosis not present

## 2017-02-07 DIAGNOSIS — Z8673 Personal history of transient ischemic attack (TIA), and cerebral infarction without residual deficits: Secondary | ICD-10-CM | POA: Diagnosis not present

## 2017-02-07 DIAGNOSIS — I2699 Other pulmonary embolism without acute cor pulmonale: Secondary | ICD-10-CM | POA: Diagnosis not present

## 2017-02-07 DIAGNOSIS — D638 Anemia in other chronic diseases classified elsewhere: Secondary | ICD-10-CM | POA: Diagnosis not present

## 2017-02-07 DIAGNOSIS — E1142 Type 2 diabetes mellitus with diabetic polyneuropathy: Secondary | ICD-10-CM | POA: Diagnosis not present

## 2017-02-07 DIAGNOSIS — N3020 Other chronic cystitis without hematuria: Secondary | ICD-10-CM | POA: Diagnosis not present

## 2017-02-07 DIAGNOSIS — M810 Age-related osteoporosis without current pathological fracture: Secondary | ICD-10-CM | POA: Diagnosis not present

## 2017-02-07 DIAGNOSIS — M6281 Muscle weakness (generalized): Secondary | ICD-10-CM | POA: Diagnosis not present

## 2017-02-10 DIAGNOSIS — M1991 Primary osteoarthritis, unspecified site: Secondary | ICD-10-CM | POA: Diagnosis not present

## 2017-02-10 DIAGNOSIS — E1142 Type 2 diabetes mellitus with diabetic polyneuropathy: Secondary | ICD-10-CM | POA: Diagnosis not present

## 2017-02-10 DIAGNOSIS — I2699 Other pulmonary embolism without acute cor pulmonale: Secondary | ICD-10-CM | POA: Diagnosis not present

## 2017-02-10 DIAGNOSIS — Z8673 Personal history of transient ischemic attack (TIA), and cerebral infarction without residual deficits: Secondary | ICD-10-CM | POA: Diagnosis not present

## 2017-02-10 DIAGNOSIS — M6281 Muscle weakness (generalized): Secondary | ICD-10-CM | POA: Diagnosis not present

## 2017-02-10 DIAGNOSIS — D638 Anemia in other chronic diseases classified elsewhere: Secondary | ICD-10-CM | POA: Diagnosis not present

## 2017-02-10 DIAGNOSIS — N3020 Other chronic cystitis without hematuria: Secondary | ICD-10-CM | POA: Diagnosis not present

## 2017-02-10 DIAGNOSIS — M810 Age-related osteoporosis without current pathological fracture: Secondary | ICD-10-CM | POA: Diagnosis not present

## 2017-02-10 DIAGNOSIS — I471 Supraventricular tachycardia: Secondary | ICD-10-CM | POA: Diagnosis not present

## 2017-02-10 DIAGNOSIS — I10 Essential (primary) hypertension: Secondary | ICD-10-CM | POA: Diagnosis not present

## 2017-02-11 DIAGNOSIS — D638 Anemia in other chronic diseases classified elsewhere: Secondary | ICD-10-CM | POA: Diagnosis not present

## 2017-02-11 DIAGNOSIS — I10 Essential (primary) hypertension: Secondary | ICD-10-CM | POA: Diagnosis not present

## 2017-02-11 DIAGNOSIS — M6281 Muscle weakness (generalized): Secondary | ICD-10-CM | POA: Diagnosis not present

## 2017-02-11 DIAGNOSIS — I2699 Other pulmonary embolism without acute cor pulmonale: Secondary | ICD-10-CM | POA: Diagnosis not present

## 2017-02-11 DIAGNOSIS — E1142 Type 2 diabetes mellitus with diabetic polyneuropathy: Secondary | ICD-10-CM | POA: Diagnosis not present

## 2017-02-11 DIAGNOSIS — N3020 Other chronic cystitis without hematuria: Secondary | ICD-10-CM | POA: Diagnosis not present

## 2017-02-11 DIAGNOSIS — I471 Supraventricular tachycardia: Secondary | ICD-10-CM | POA: Diagnosis not present

## 2017-02-11 DIAGNOSIS — Z8673 Personal history of transient ischemic attack (TIA), and cerebral infarction without residual deficits: Secondary | ICD-10-CM | POA: Diagnosis not present

## 2017-02-11 DIAGNOSIS — M1991 Primary osteoarthritis, unspecified site: Secondary | ICD-10-CM | POA: Diagnosis not present

## 2017-02-11 DIAGNOSIS — M810 Age-related osteoporosis without current pathological fracture: Secondary | ICD-10-CM | POA: Diagnosis not present

## 2017-02-13 DIAGNOSIS — D638 Anemia in other chronic diseases classified elsewhere: Secondary | ICD-10-CM | POA: Diagnosis not present

## 2017-02-13 DIAGNOSIS — N3020 Other chronic cystitis without hematuria: Secondary | ICD-10-CM | POA: Diagnosis not present

## 2017-02-13 DIAGNOSIS — M1991 Primary osteoarthritis, unspecified site: Secondary | ICD-10-CM | POA: Diagnosis not present

## 2017-02-13 DIAGNOSIS — M1711 Unilateral primary osteoarthritis, right knee: Secondary | ICD-10-CM | POA: Diagnosis not present

## 2017-02-13 DIAGNOSIS — C5701 Malignant neoplasm of right fallopian tube: Secondary | ICD-10-CM | POA: Diagnosis not present

## 2017-02-13 DIAGNOSIS — G620 Drug-induced polyneuropathy: Secondary | ICD-10-CM | POA: Diagnosis not present

## 2017-02-13 DIAGNOSIS — I10 Essential (primary) hypertension: Secondary | ICD-10-CM | POA: Diagnosis not present

## 2017-02-13 DIAGNOSIS — N390 Urinary tract infection, site not specified: Secondary | ICD-10-CM | POA: Diagnosis not present

## 2017-02-13 DIAGNOSIS — Z8673 Personal history of transient ischemic attack (TIA), and cerebral infarction without residual deficits: Secondary | ICD-10-CM | POA: Diagnosis not present

## 2017-02-13 DIAGNOSIS — E1142 Type 2 diabetes mellitus with diabetic polyneuropathy: Secondary | ICD-10-CM | POA: Diagnosis not present

## 2017-02-13 DIAGNOSIS — Z79899 Other long term (current) drug therapy: Secondary | ICD-10-CM | POA: Diagnosis not present

## 2017-02-13 DIAGNOSIS — I2699 Other pulmonary embolism without acute cor pulmonale: Secondary | ICD-10-CM | POA: Diagnosis not present

## 2017-02-13 DIAGNOSIS — M6281 Muscle weakness (generalized): Secondary | ICD-10-CM | POA: Diagnosis not present

## 2017-02-13 DIAGNOSIS — I471 Supraventricular tachycardia: Secondary | ICD-10-CM | POA: Diagnosis not present

## 2017-02-13 DIAGNOSIS — Z7901 Long term (current) use of anticoagulants: Secondary | ICD-10-CM | POA: Diagnosis not present

## 2017-02-13 DIAGNOSIS — M810 Age-related osteoporosis without current pathological fracture: Secondary | ICD-10-CM | POA: Diagnosis not present

## 2017-02-13 DIAGNOSIS — J301 Allergic rhinitis due to pollen: Secondary | ICD-10-CM | POA: Diagnosis not present

## 2017-02-14 DIAGNOSIS — D638 Anemia in other chronic diseases classified elsewhere: Secondary | ICD-10-CM | POA: Diagnosis not present

## 2017-02-14 DIAGNOSIS — M810 Age-related osteoporosis without current pathological fracture: Secondary | ICD-10-CM | POA: Diagnosis not present

## 2017-02-14 DIAGNOSIS — I10 Essential (primary) hypertension: Secondary | ICD-10-CM | POA: Diagnosis not present

## 2017-02-14 DIAGNOSIS — I471 Supraventricular tachycardia: Secondary | ICD-10-CM | POA: Diagnosis not present

## 2017-02-14 DIAGNOSIS — M6281 Muscle weakness (generalized): Secondary | ICD-10-CM | POA: Diagnosis not present

## 2017-02-14 DIAGNOSIS — I2699 Other pulmonary embolism without acute cor pulmonale: Secondary | ICD-10-CM | POA: Diagnosis not present

## 2017-02-14 DIAGNOSIS — Z8673 Personal history of transient ischemic attack (TIA), and cerebral infarction without residual deficits: Secondary | ICD-10-CM | POA: Diagnosis not present

## 2017-02-14 DIAGNOSIS — M1991 Primary osteoarthritis, unspecified site: Secondary | ICD-10-CM | POA: Diagnosis not present

## 2017-02-14 DIAGNOSIS — E1142 Type 2 diabetes mellitus with diabetic polyneuropathy: Secondary | ICD-10-CM | POA: Diagnosis not present

## 2017-02-14 DIAGNOSIS — N3020 Other chronic cystitis without hematuria: Secondary | ICD-10-CM | POA: Diagnosis not present

## 2017-02-17 DIAGNOSIS — I10 Essential (primary) hypertension: Secondary | ICD-10-CM | POA: Diagnosis not present

## 2017-02-17 DIAGNOSIS — D638 Anemia in other chronic diseases classified elsewhere: Secondary | ICD-10-CM | POA: Diagnosis not present

## 2017-02-17 DIAGNOSIS — E1142 Type 2 diabetes mellitus with diabetic polyneuropathy: Secondary | ICD-10-CM | POA: Diagnosis not present

## 2017-02-17 DIAGNOSIS — I471 Supraventricular tachycardia: Secondary | ICD-10-CM | POA: Diagnosis not present

## 2017-02-17 DIAGNOSIS — M810 Age-related osteoporosis without current pathological fracture: Secondary | ICD-10-CM | POA: Diagnosis not present

## 2017-02-17 DIAGNOSIS — M1991 Primary osteoarthritis, unspecified site: Secondary | ICD-10-CM | POA: Diagnosis not present

## 2017-02-17 DIAGNOSIS — Z8673 Personal history of transient ischemic attack (TIA), and cerebral infarction without residual deficits: Secondary | ICD-10-CM | POA: Diagnosis not present

## 2017-02-17 DIAGNOSIS — N3020 Other chronic cystitis without hematuria: Secondary | ICD-10-CM | POA: Diagnosis not present

## 2017-02-17 DIAGNOSIS — I2699 Other pulmonary embolism without acute cor pulmonale: Secondary | ICD-10-CM | POA: Diagnosis not present

## 2017-02-17 DIAGNOSIS — M6281 Muscle weakness (generalized): Secondary | ICD-10-CM | POA: Diagnosis not present

## 2017-02-18 DIAGNOSIS — Z86711 Personal history of pulmonary embolism: Secondary | ICD-10-CM | POA: Diagnosis not present

## 2017-02-18 DIAGNOSIS — I341 Nonrheumatic mitral (valve) prolapse: Secondary | ICD-10-CM | POA: Diagnosis not present

## 2017-02-18 DIAGNOSIS — E1142 Type 2 diabetes mellitus with diabetic polyneuropathy: Secondary | ICD-10-CM | POA: Diagnosis not present

## 2017-02-18 DIAGNOSIS — N3020 Other chronic cystitis without hematuria: Secondary | ICD-10-CM | POA: Diagnosis not present

## 2017-02-18 DIAGNOSIS — M1991 Primary osteoarthritis, unspecified site: Secondary | ICD-10-CM | POA: Diagnosis not present

## 2017-02-18 DIAGNOSIS — E1169 Type 2 diabetes mellitus with other specified complication: Secondary | ICD-10-CM | POA: Diagnosis not present

## 2017-02-18 DIAGNOSIS — M6281 Muscle weakness (generalized): Secondary | ICD-10-CM | POA: Diagnosis not present

## 2017-02-18 DIAGNOSIS — Z8673 Personal history of transient ischemic attack (TIA), and cerebral infarction without residual deficits: Secondary | ICD-10-CM | POA: Diagnosis not present

## 2017-02-18 DIAGNOSIS — I10 Essential (primary) hypertension: Secondary | ICD-10-CM | POA: Diagnosis not present

## 2017-02-18 DIAGNOSIS — I2699 Other pulmonary embolism without acute cor pulmonale: Secondary | ICD-10-CM | POA: Diagnosis not present

## 2017-02-18 DIAGNOSIS — I071 Rheumatic tricuspid insufficiency: Secondary | ICD-10-CM | POA: Diagnosis not present

## 2017-02-18 DIAGNOSIS — I340 Nonrheumatic mitral (valve) insufficiency: Secondary | ICD-10-CM | POA: Diagnosis not present

## 2017-02-18 DIAGNOSIS — D638 Anemia in other chronic diseases classified elsewhere: Secondary | ICD-10-CM | POA: Diagnosis not present

## 2017-02-18 DIAGNOSIS — M810 Age-related osteoporosis without current pathological fracture: Secondary | ICD-10-CM | POA: Diagnosis not present

## 2017-02-18 DIAGNOSIS — I471 Supraventricular tachycardia: Secondary | ICD-10-CM | POA: Diagnosis not present

## 2017-02-18 DIAGNOSIS — E782 Mixed hyperlipidemia: Secondary | ICD-10-CM | POA: Diagnosis not present

## 2017-02-20 DIAGNOSIS — Z8673 Personal history of transient ischemic attack (TIA), and cerebral infarction without residual deficits: Secondary | ICD-10-CM | POA: Diagnosis not present

## 2017-02-20 DIAGNOSIS — I471 Supraventricular tachycardia: Secondary | ICD-10-CM | POA: Diagnosis not present

## 2017-02-20 DIAGNOSIS — M1991 Primary osteoarthritis, unspecified site: Secondary | ICD-10-CM | POA: Diagnosis not present

## 2017-02-20 DIAGNOSIS — I2699 Other pulmonary embolism without acute cor pulmonale: Secondary | ICD-10-CM | POA: Diagnosis not present

## 2017-02-20 DIAGNOSIS — E1142 Type 2 diabetes mellitus with diabetic polyneuropathy: Secondary | ICD-10-CM | POA: Diagnosis not present

## 2017-02-20 DIAGNOSIS — M810 Age-related osteoporosis without current pathological fracture: Secondary | ICD-10-CM | POA: Diagnosis not present

## 2017-02-20 DIAGNOSIS — I10 Essential (primary) hypertension: Secondary | ICD-10-CM | POA: Diagnosis not present

## 2017-02-20 DIAGNOSIS — D638 Anemia in other chronic diseases classified elsewhere: Secondary | ICD-10-CM | POA: Diagnosis not present

## 2017-02-20 DIAGNOSIS — M6281 Muscle weakness (generalized): Secondary | ICD-10-CM | POA: Diagnosis not present

## 2017-02-20 DIAGNOSIS — N3020 Other chronic cystitis without hematuria: Secondary | ICD-10-CM | POA: Diagnosis not present

## 2017-02-21 DIAGNOSIS — I471 Supraventricular tachycardia: Secondary | ICD-10-CM | POA: Diagnosis not present

## 2017-02-21 DIAGNOSIS — M6281 Muscle weakness (generalized): Secondary | ICD-10-CM | POA: Diagnosis not present

## 2017-02-21 DIAGNOSIS — N3020 Other chronic cystitis without hematuria: Secondary | ICD-10-CM | POA: Diagnosis not present

## 2017-02-21 DIAGNOSIS — Z8673 Personal history of transient ischemic attack (TIA), and cerebral infarction without residual deficits: Secondary | ICD-10-CM | POA: Diagnosis not present

## 2017-02-21 DIAGNOSIS — I2699 Other pulmonary embolism without acute cor pulmonale: Secondary | ICD-10-CM | POA: Diagnosis not present

## 2017-02-21 DIAGNOSIS — M810 Age-related osteoporosis without current pathological fracture: Secondary | ICD-10-CM | POA: Diagnosis not present

## 2017-02-21 DIAGNOSIS — D638 Anemia in other chronic diseases classified elsewhere: Secondary | ICD-10-CM | POA: Diagnosis not present

## 2017-02-21 DIAGNOSIS — M1991 Primary osteoarthritis, unspecified site: Secondary | ICD-10-CM | POA: Diagnosis not present

## 2017-02-21 DIAGNOSIS — I10 Essential (primary) hypertension: Secondary | ICD-10-CM | POA: Diagnosis not present

## 2017-02-21 DIAGNOSIS — E1142 Type 2 diabetes mellitus with diabetic polyneuropathy: Secondary | ICD-10-CM | POA: Diagnosis not present

## 2017-02-24 DIAGNOSIS — E1142 Type 2 diabetes mellitus with diabetic polyneuropathy: Secondary | ICD-10-CM | POA: Diagnosis not present

## 2017-02-24 DIAGNOSIS — I10 Essential (primary) hypertension: Secondary | ICD-10-CM | POA: Diagnosis not present

## 2017-02-24 DIAGNOSIS — M6281 Muscle weakness (generalized): Secondary | ICD-10-CM | POA: Diagnosis not present

## 2017-02-24 DIAGNOSIS — D638 Anemia in other chronic diseases classified elsewhere: Secondary | ICD-10-CM | POA: Diagnosis not present

## 2017-02-24 DIAGNOSIS — I471 Supraventricular tachycardia: Secondary | ICD-10-CM | POA: Diagnosis not present

## 2017-02-24 DIAGNOSIS — M810 Age-related osteoporosis without current pathological fracture: Secondary | ICD-10-CM | POA: Diagnosis not present

## 2017-02-24 DIAGNOSIS — I2699 Other pulmonary embolism without acute cor pulmonale: Secondary | ICD-10-CM | POA: Diagnosis not present

## 2017-02-24 DIAGNOSIS — M1991 Primary osteoarthritis, unspecified site: Secondary | ICD-10-CM | POA: Diagnosis not present

## 2017-02-24 DIAGNOSIS — Z8673 Personal history of transient ischemic attack (TIA), and cerebral infarction without residual deficits: Secondary | ICD-10-CM | POA: Diagnosis not present

## 2017-02-24 DIAGNOSIS — N3020 Other chronic cystitis without hematuria: Secondary | ICD-10-CM | POA: Diagnosis not present

## 2017-02-26 DIAGNOSIS — E1142 Type 2 diabetes mellitus with diabetic polyneuropathy: Secondary | ICD-10-CM | POA: Diagnosis not present

## 2017-02-26 DIAGNOSIS — D638 Anemia in other chronic diseases classified elsewhere: Secondary | ICD-10-CM | POA: Diagnosis not present

## 2017-02-26 DIAGNOSIS — I471 Supraventricular tachycardia: Secondary | ICD-10-CM | POA: Diagnosis not present

## 2017-02-26 DIAGNOSIS — Z8673 Personal history of transient ischemic attack (TIA), and cerebral infarction without residual deficits: Secondary | ICD-10-CM | POA: Diagnosis not present

## 2017-02-26 DIAGNOSIS — M810 Age-related osteoporosis without current pathological fracture: Secondary | ICD-10-CM | POA: Diagnosis not present

## 2017-02-26 DIAGNOSIS — N3020 Other chronic cystitis without hematuria: Secondary | ICD-10-CM | POA: Diagnosis not present

## 2017-02-26 DIAGNOSIS — I2699 Other pulmonary embolism without acute cor pulmonale: Secondary | ICD-10-CM | POA: Diagnosis not present

## 2017-02-26 DIAGNOSIS — M1991 Primary osteoarthritis, unspecified site: Secondary | ICD-10-CM | POA: Diagnosis not present

## 2017-02-26 DIAGNOSIS — M6281 Muscle weakness (generalized): Secondary | ICD-10-CM | POA: Diagnosis not present

## 2017-02-26 DIAGNOSIS — I10 Essential (primary) hypertension: Secondary | ICD-10-CM | POA: Diagnosis not present

## 2017-02-27 DIAGNOSIS — E1142 Type 2 diabetes mellitus with diabetic polyneuropathy: Secondary | ICD-10-CM | POA: Diagnosis not present

## 2017-02-27 DIAGNOSIS — M810 Age-related osteoporosis without current pathological fracture: Secondary | ICD-10-CM | POA: Diagnosis not present

## 2017-02-27 DIAGNOSIS — M1991 Primary osteoarthritis, unspecified site: Secondary | ICD-10-CM | POA: Diagnosis not present

## 2017-02-27 DIAGNOSIS — I471 Supraventricular tachycardia: Secondary | ICD-10-CM | POA: Diagnosis not present

## 2017-02-27 DIAGNOSIS — N3020 Other chronic cystitis without hematuria: Secondary | ICD-10-CM | POA: Diagnosis not present

## 2017-02-27 DIAGNOSIS — D638 Anemia in other chronic diseases classified elsewhere: Secondary | ICD-10-CM | POA: Diagnosis not present

## 2017-02-27 DIAGNOSIS — Z8673 Personal history of transient ischemic attack (TIA), and cerebral infarction without residual deficits: Secondary | ICD-10-CM | POA: Diagnosis not present

## 2017-02-27 DIAGNOSIS — M6281 Muscle weakness (generalized): Secondary | ICD-10-CM | POA: Diagnosis not present

## 2017-02-27 DIAGNOSIS — I10 Essential (primary) hypertension: Secondary | ICD-10-CM | POA: Diagnosis not present

## 2017-02-27 DIAGNOSIS — I2699 Other pulmonary embolism without acute cor pulmonale: Secondary | ICD-10-CM | POA: Diagnosis not present

## 2017-02-28 DIAGNOSIS — D638 Anemia in other chronic diseases classified elsewhere: Secondary | ICD-10-CM | POA: Diagnosis not present

## 2017-02-28 DIAGNOSIS — E1142 Type 2 diabetes mellitus with diabetic polyneuropathy: Secondary | ICD-10-CM | POA: Diagnosis not present

## 2017-02-28 DIAGNOSIS — I10 Essential (primary) hypertension: Secondary | ICD-10-CM | POA: Diagnosis not present

## 2017-02-28 DIAGNOSIS — M6281 Muscle weakness (generalized): Secondary | ICD-10-CM | POA: Diagnosis not present

## 2017-02-28 DIAGNOSIS — M810 Age-related osteoporosis without current pathological fracture: Secondary | ICD-10-CM | POA: Diagnosis not present

## 2017-02-28 DIAGNOSIS — Z8673 Personal history of transient ischemic attack (TIA), and cerebral infarction without residual deficits: Secondary | ICD-10-CM | POA: Diagnosis not present

## 2017-02-28 DIAGNOSIS — I2699 Other pulmonary embolism without acute cor pulmonale: Secondary | ICD-10-CM | POA: Diagnosis not present

## 2017-02-28 DIAGNOSIS — I471 Supraventricular tachycardia: Secondary | ICD-10-CM | POA: Diagnosis not present

## 2017-02-28 DIAGNOSIS — N3020 Other chronic cystitis without hematuria: Secondary | ICD-10-CM | POA: Diagnosis not present

## 2017-02-28 DIAGNOSIS — M1991 Primary osteoarthritis, unspecified site: Secondary | ICD-10-CM | POA: Diagnosis not present

## 2017-03-03 DIAGNOSIS — N3020 Other chronic cystitis without hematuria: Secondary | ICD-10-CM | POA: Diagnosis not present

## 2017-03-03 DIAGNOSIS — M1991 Primary osteoarthritis, unspecified site: Secondary | ICD-10-CM | POA: Diagnosis not present

## 2017-03-03 DIAGNOSIS — M6281 Muscle weakness (generalized): Secondary | ICD-10-CM | POA: Diagnosis not present

## 2017-03-03 DIAGNOSIS — Z8673 Personal history of transient ischemic attack (TIA), and cerebral infarction without residual deficits: Secondary | ICD-10-CM | POA: Diagnosis not present

## 2017-03-03 DIAGNOSIS — D638 Anemia in other chronic diseases classified elsewhere: Secondary | ICD-10-CM | POA: Diagnosis not present

## 2017-03-03 DIAGNOSIS — I471 Supraventricular tachycardia: Secondary | ICD-10-CM | POA: Diagnosis not present

## 2017-03-03 DIAGNOSIS — E1142 Type 2 diabetes mellitus with diabetic polyneuropathy: Secondary | ICD-10-CM | POA: Diagnosis not present

## 2017-03-03 DIAGNOSIS — I10 Essential (primary) hypertension: Secondary | ICD-10-CM | POA: Diagnosis not present

## 2017-03-03 DIAGNOSIS — I2699 Other pulmonary embolism without acute cor pulmonale: Secondary | ICD-10-CM | POA: Diagnosis not present

## 2017-03-03 DIAGNOSIS — M810 Age-related osteoporosis without current pathological fracture: Secondary | ICD-10-CM | POA: Diagnosis not present

## 2017-03-05 DIAGNOSIS — M810 Age-related osteoporosis without current pathological fracture: Secondary | ICD-10-CM | POA: Diagnosis not present

## 2017-03-05 DIAGNOSIS — M1991 Primary osteoarthritis, unspecified site: Secondary | ICD-10-CM | POA: Diagnosis not present

## 2017-03-05 DIAGNOSIS — E1142 Type 2 diabetes mellitus with diabetic polyneuropathy: Secondary | ICD-10-CM | POA: Diagnosis not present

## 2017-03-05 DIAGNOSIS — N3020 Other chronic cystitis without hematuria: Secondary | ICD-10-CM | POA: Diagnosis not present

## 2017-03-05 DIAGNOSIS — I471 Supraventricular tachycardia: Secondary | ICD-10-CM | POA: Diagnosis not present

## 2017-03-05 DIAGNOSIS — M6281 Muscle weakness (generalized): Secondary | ICD-10-CM | POA: Diagnosis not present

## 2017-03-05 DIAGNOSIS — I2699 Other pulmonary embolism without acute cor pulmonale: Secondary | ICD-10-CM | POA: Diagnosis not present

## 2017-03-05 DIAGNOSIS — Z8673 Personal history of transient ischemic attack (TIA), and cerebral infarction without residual deficits: Secondary | ICD-10-CM | POA: Diagnosis not present

## 2017-03-05 DIAGNOSIS — I10 Essential (primary) hypertension: Secondary | ICD-10-CM | POA: Diagnosis not present

## 2017-03-05 DIAGNOSIS — D638 Anemia in other chronic diseases classified elsewhere: Secondary | ICD-10-CM | POA: Diagnosis not present

## 2017-03-07 DIAGNOSIS — M810 Age-related osteoporosis without current pathological fracture: Secondary | ICD-10-CM | POA: Diagnosis not present

## 2017-03-07 DIAGNOSIS — E1142 Type 2 diabetes mellitus with diabetic polyneuropathy: Secondary | ICD-10-CM | POA: Diagnosis not present

## 2017-03-07 DIAGNOSIS — M6281 Muscle weakness (generalized): Secondary | ICD-10-CM | POA: Diagnosis not present

## 2017-03-07 DIAGNOSIS — I2699 Other pulmonary embolism without acute cor pulmonale: Secondary | ICD-10-CM | POA: Diagnosis not present

## 2017-03-07 DIAGNOSIS — I471 Supraventricular tachycardia: Secondary | ICD-10-CM | POA: Diagnosis not present

## 2017-03-07 DIAGNOSIS — D638 Anemia in other chronic diseases classified elsewhere: Secondary | ICD-10-CM | POA: Diagnosis not present

## 2017-03-07 DIAGNOSIS — N3020 Other chronic cystitis without hematuria: Secondary | ICD-10-CM | POA: Diagnosis not present

## 2017-03-07 DIAGNOSIS — Z8673 Personal history of transient ischemic attack (TIA), and cerebral infarction without residual deficits: Secondary | ICD-10-CM | POA: Diagnosis not present

## 2017-03-07 DIAGNOSIS — M1991 Primary osteoarthritis, unspecified site: Secondary | ICD-10-CM | POA: Diagnosis not present

## 2017-03-07 DIAGNOSIS — I10 Essential (primary) hypertension: Secondary | ICD-10-CM | POA: Diagnosis not present

## 2017-03-10 DIAGNOSIS — M1991 Primary osteoarthritis, unspecified site: Secondary | ICD-10-CM | POA: Diagnosis not present

## 2017-03-10 DIAGNOSIS — N3020 Other chronic cystitis without hematuria: Secondary | ICD-10-CM | POA: Diagnosis not present

## 2017-03-10 DIAGNOSIS — I471 Supraventricular tachycardia: Secondary | ICD-10-CM | POA: Diagnosis not present

## 2017-03-10 DIAGNOSIS — M6281 Muscle weakness (generalized): Secondary | ICD-10-CM | POA: Diagnosis not present

## 2017-03-10 DIAGNOSIS — Z8673 Personal history of transient ischemic attack (TIA), and cerebral infarction without residual deficits: Secondary | ICD-10-CM | POA: Diagnosis not present

## 2017-03-10 DIAGNOSIS — E1142 Type 2 diabetes mellitus with diabetic polyneuropathy: Secondary | ICD-10-CM | POA: Diagnosis not present

## 2017-03-10 DIAGNOSIS — D638 Anemia in other chronic diseases classified elsewhere: Secondary | ICD-10-CM | POA: Diagnosis not present

## 2017-03-10 DIAGNOSIS — M810 Age-related osteoporosis without current pathological fracture: Secondary | ICD-10-CM | POA: Diagnosis not present

## 2017-03-10 DIAGNOSIS — I2699 Other pulmonary embolism without acute cor pulmonale: Secondary | ICD-10-CM | POA: Diagnosis not present

## 2017-03-10 DIAGNOSIS — I10 Essential (primary) hypertension: Secondary | ICD-10-CM | POA: Diagnosis not present

## 2017-03-13 DIAGNOSIS — N3020 Other chronic cystitis without hematuria: Secondary | ICD-10-CM | POA: Diagnosis not present

## 2017-03-13 DIAGNOSIS — M810 Age-related osteoporosis without current pathological fracture: Secondary | ICD-10-CM | POA: Diagnosis not present

## 2017-03-13 DIAGNOSIS — D638 Anemia in other chronic diseases classified elsewhere: Secondary | ICD-10-CM | POA: Diagnosis not present

## 2017-03-13 DIAGNOSIS — E1142 Type 2 diabetes mellitus with diabetic polyneuropathy: Secondary | ICD-10-CM | POA: Diagnosis not present

## 2017-03-13 DIAGNOSIS — M6281 Muscle weakness (generalized): Secondary | ICD-10-CM | POA: Diagnosis not present

## 2017-03-13 DIAGNOSIS — I471 Supraventricular tachycardia: Secondary | ICD-10-CM | POA: Diagnosis not present

## 2017-03-13 DIAGNOSIS — Z8673 Personal history of transient ischemic attack (TIA), and cerebral infarction without residual deficits: Secondary | ICD-10-CM | POA: Diagnosis not present

## 2017-03-13 DIAGNOSIS — I10 Essential (primary) hypertension: Secondary | ICD-10-CM | POA: Diagnosis not present

## 2017-03-13 DIAGNOSIS — I2699 Other pulmonary embolism without acute cor pulmonale: Secondary | ICD-10-CM | POA: Diagnosis not present

## 2017-03-13 DIAGNOSIS — M1991 Primary osteoarthritis, unspecified site: Secondary | ICD-10-CM | POA: Diagnosis not present

## 2017-03-17 DIAGNOSIS — I10 Essential (primary) hypertension: Secondary | ICD-10-CM | POA: Diagnosis not present

## 2017-03-17 DIAGNOSIS — M6281 Muscle weakness (generalized): Secondary | ICD-10-CM | POA: Diagnosis not present

## 2017-03-17 DIAGNOSIS — M1991 Primary osteoarthritis, unspecified site: Secondary | ICD-10-CM | POA: Diagnosis not present

## 2017-03-17 DIAGNOSIS — E1142 Type 2 diabetes mellitus with diabetic polyneuropathy: Secondary | ICD-10-CM | POA: Diagnosis not present

## 2017-03-17 DIAGNOSIS — I2699 Other pulmonary embolism without acute cor pulmonale: Secondary | ICD-10-CM | POA: Diagnosis not present

## 2017-03-17 DIAGNOSIS — M810 Age-related osteoporosis without current pathological fracture: Secondary | ICD-10-CM | POA: Diagnosis not present

## 2017-03-17 DIAGNOSIS — Z8673 Personal history of transient ischemic attack (TIA), and cerebral infarction without residual deficits: Secondary | ICD-10-CM | POA: Diagnosis not present

## 2017-03-17 DIAGNOSIS — I471 Supraventricular tachycardia: Secondary | ICD-10-CM | POA: Diagnosis not present

## 2017-03-17 DIAGNOSIS — D638 Anemia in other chronic diseases classified elsewhere: Secondary | ICD-10-CM | POA: Diagnosis not present

## 2017-03-17 DIAGNOSIS — N3020 Other chronic cystitis without hematuria: Secondary | ICD-10-CM | POA: Diagnosis not present

## 2017-03-20 DIAGNOSIS — M810 Age-related osteoporosis without current pathological fracture: Secondary | ICD-10-CM | POA: Diagnosis not present

## 2017-03-20 DIAGNOSIS — D638 Anemia in other chronic diseases classified elsewhere: Secondary | ICD-10-CM | POA: Diagnosis not present

## 2017-03-20 DIAGNOSIS — E1142 Type 2 diabetes mellitus with diabetic polyneuropathy: Secondary | ICD-10-CM | POA: Diagnosis not present

## 2017-03-20 DIAGNOSIS — I471 Supraventricular tachycardia: Secondary | ICD-10-CM | POA: Diagnosis not present

## 2017-03-20 DIAGNOSIS — Z8673 Personal history of transient ischemic attack (TIA), and cerebral infarction without residual deficits: Secondary | ICD-10-CM | POA: Diagnosis not present

## 2017-03-20 DIAGNOSIS — M6281 Muscle weakness (generalized): Secondary | ICD-10-CM | POA: Diagnosis not present

## 2017-03-20 DIAGNOSIS — M1991 Primary osteoarthritis, unspecified site: Secondary | ICD-10-CM | POA: Diagnosis not present

## 2017-03-20 DIAGNOSIS — N3020 Other chronic cystitis without hematuria: Secondary | ICD-10-CM | POA: Diagnosis not present

## 2017-03-20 DIAGNOSIS — I2699 Other pulmonary embolism without acute cor pulmonale: Secondary | ICD-10-CM | POA: Diagnosis not present

## 2017-03-20 DIAGNOSIS — I10 Essential (primary) hypertension: Secondary | ICD-10-CM | POA: Diagnosis not present

## 2017-03-25 DIAGNOSIS — I2699 Other pulmonary embolism without acute cor pulmonale: Secondary | ICD-10-CM | POA: Diagnosis not present

## 2017-03-25 DIAGNOSIS — Z8673 Personal history of transient ischemic attack (TIA), and cerebral infarction without residual deficits: Secondary | ICD-10-CM | POA: Diagnosis not present

## 2017-03-25 DIAGNOSIS — M1991 Primary osteoarthritis, unspecified site: Secondary | ICD-10-CM | POA: Diagnosis not present

## 2017-03-25 DIAGNOSIS — D638 Anemia in other chronic diseases classified elsewhere: Secondary | ICD-10-CM | POA: Diagnosis not present

## 2017-03-25 DIAGNOSIS — I10 Essential (primary) hypertension: Secondary | ICD-10-CM | POA: Diagnosis not present

## 2017-03-25 DIAGNOSIS — N3020 Other chronic cystitis without hematuria: Secondary | ICD-10-CM | POA: Diagnosis not present

## 2017-03-25 DIAGNOSIS — M6281 Muscle weakness (generalized): Secondary | ICD-10-CM | POA: Diagnosis not present

## 2017-03-25 DIAGNOSIS — M810 Age-related osteoporosis without current pathological fracture: Secondary | ICD-10-CM | POA: Diagnosis not present

## 2017-03-25 DIAGNOSIS — I471 Supraventricular tachycardia: Secondary | ICD-10-CM | POA: Diagnosis not present

## 2017-03-25 DIAGNOSIS — E1142 Type 2 diabetes mellitus with diabetic polyneuropathy: Secondary | ICD-10-CM | POA: Diagnosis not present

## 2017-03-27 DIAGNOSIS — I2699 Other pulmonary embolism without acute cor pulmonale: Secondary | ICD-10-CM | POA: Diagnosis not present

## 2017-03-27 DIAGNOSIS — I10 Essential (primary) hypertension: Secondary | ICD-10-CM | POA: Diagnosis not present

## 2017-03-27 DIAGNOSIS — D638 Anemia in other chronic diseases classified elsewhere: Secondary | ICD-10-CM | POA: Diagnosis not present

## 2017-03-27 DIAGNOSIS — I471 Supraventricular tachycardia: Secondary | ICD-10-CM | POA: Diagnosis not present

## 2017-03-27 DIAGNOSIS — E1142 Type 2 diabetes mellitus with diabetic polyneuropathy: Secondary | ICD-10-CM | POA: Diagnosis not present

## 2017-03-27 DIAGNOSIS — N3020 Other chronic cystitis without hematuria: Secondary | ICD-10-CM | POA: Diagnosis not present

## 2017-03-27 DIAGNOSIS — Z8673 Personal history of transient ischemic attack (TIA), and cerebral infarction without residual deficits: Secondary | ICD-10-CM | POA: Diagnosis not present

## 2017-03-27 DIAGNOSIS — M810 Age-related osteoporosis without current pathological fracture: Secondary | ICD-10-CM | POA: Diagnosis not present

## 2017-03-27 DIAGNOSIS — M1991 Primary osteoarthritis, unspecified site: Secondary | ICD-10-CM | POA: Diagnosis not present

## 2017-03-27 DIAGNOSIS — M6281 Muscle weakness (generalized): Secondary | ICD-10-CM | POA: Diagnosis not present

## 2017-03-31 DIAGNOSIS — D638 Anemia in other chronic diseases classified elsewhere: Secondary | ICD-10-CM | POA: Diagnosis not present

## 2017-03-31 DIAGNOSIS — I10 Essential (primary) hypertension: Secondary | ICD-10-CM | POA: Diagnosis not present

## 2017-03-31 DIAGNOSIS — M1991 Primary osteoarthritis, unspecified site: Secondary | ICD-10-CM | POA: Diagnosis not present

## 2017-03-31 DIAGNOSIS — M6281 Muscle weakness (generalized): Secondary | ICD-10-CM | POA: Diagnosis not present

## 2017-03-31 DIAGNOSIS — Z8673 Personal history of transient ischemic attack (TIA), and cerebral infarction without residual deficits: Secondary | ICD-10-CM | POA: Diagnosis not present

## 2017-03-31 DIAGNOSIS — I2699 Other pulmonary embolism without acute cor pulmonale: Secondary | ICD-10-CM | POA: Diagnosis not present

## 2017-03-31 DIAGNOSIS — I471 Supraventricular tachycardia: Secondary | ICD-10-CM | POA: Diagnosis not present

## 2017-03-31 DIAGNOSIS — N3020 Other chronic cystitis without hematuria: Secondary | ICD-10-CM | POA: Diagnosis not present

## 2017-03-31 DIAGNOSIS — E1142 Type 2 diabetes mellitus with diabetic polyneuropathy: Secondary | ICD-10-CM | POA: Diagnosis not present

## 2017-03-31 DIAGNOSIS — M810 Age-related osteoporosis without current pathological fracture: Secondary | ICD-10-CM | POA: Diagnosis not present

## 2017-04-03 DIAGNOSIS — M1991 Primary osteoarthritis, unspecified site: Secondary | ICD-10-CM | POA: Diagnosis not present

## 2017-04-03 DIAGNOSIS — I2699 Other pulmonary embolism without acute cor pulmonale: Secondary | ICD-10-CM | POA: Diagnosis not present

## 2017-04-03 DIAGNOSIS — I10 Essential (primary) hypertension: Secondary | ICD-10-CM | POA: Diagnosis not present

## 2017-04-03 DIAGNOSIS — M6281 Muscle weakness (generalized): Secondary | ICD-10-CM | POA: Diagnosis not present

## 2017-04-03 DIAGNOSIS — I471 Supraventricular tachycardia: Secondary | ICD-10-CM | POA: Diagnosis not present

## 2017-04-03 DIAGNOSIS — E1142 Type 2 diabetes mellitus with diabetic polyneuropathy: Secondary | ICD-10-CM | POA: Diagnosis not present

## 2017-04-03 DIAGNOSIS — M810 Age-related osteoporosis without current pathological fracture: Secondary | ICD-10-CM | POA: Diagnosis not present

## 2017-04-03 DIAGNOSIS — N3020 Other chronic cystitis without hematuria: Secondary | ICD-10-CM | POA: Diagnosis not present

## 2017-04-03 DIAGNOSIS — D638 Anemia in other chronic diseases classified elsewhere: Secondary | ICD-10-CM | POA: Diagnosis not present

## 2017-04-03 DIAGNOSIS — Z8673 Personal history of transient ischemic attack (TIA), and cerebral infarction without residual deficits: Secondary | ICD-10-CM | POA: Diagnosis not present

## 2017-04-08 DIAGNOSIS — M1991 Primary osteoarthritis, unspecified site: Secondary | ICD-10-CM | POA: Diagnosis not present

## 2017-04-08 DIAGNOSIS — M6281 Muscle weakness (generalized): Secondary | ICD-10-CM | POA: Diagnosis not present

## 2017-04-08 DIAGNOSIS — I471 Supraventricular tachycardia: Secondary | ICD-10-CM | POA: Diagnosis not present

## 2017-04-08 DIAGNOSIS — I10 Essential (primary) hypertension: Secondary | ICD-10-CM | POA: Diagnosis not present

## 2017-04-08 DIAGNOSIS — D638 Anemia in other chronic diseases classified elsewhere: Secondary | ICD-10-CM | POA: Diagnosis not present

## 2017-04-08 DIAGNOSIS — Z8673 Personal history of transient ischemic attack (TIA), and cerebral infarction without residual deficits: Secondary | ICD-10-CM | POA: Diagnosis not present

## 2017-04-08 DIAGNOSIS — E1142 Type 2 diabetes mellitus with diabetic polyneuropathy: Secondary | ICD-10-CM | POA: Diagnosis not present

## 2017-04-08 DIAGNOSIS — I2699 Other pulmonary embolism without acute cor pulmonale: Secondary | ICD-10-CM | POA: Diagnosis not present

## 2017-04-08 DIAGNOSIS — N3020 Other chronic cystitis without hematuria: Secondary | ICD-10-CM | POA: Diagnosis not present

## 2017-04-08 DIAGNOSIS — M810 Age-related osteoporosis without current pathological fracture: Secondary | ICD-10-CM | POA: Diagnosis not present

## 2017-04-10 ENCOUNTER — Telehealth: Payer: Self-pay | Admitting: Internal Medicine

## 2017-04-10 DIAGNOSIS — D638 Anemia in other chronic diseases classified elsewhere: Secondary | ICD-10-CM | POA: Diagnosis not present

## 2017-04-10 DIAGNOSIS — E1142 Type 2 diabetes mellitus with diabetic polyneuropathy: Secondary | ICD-10-CM | POA: Diagnosis not present

## 2017-04-10 DIAGNOSIS — I2699 Other pulmonary embolism without acute cor pulmonale: Secondary | ICD-10-CM | POA: Diagnosis not present

## 2017-04-10 DIAGNOSIS — M6281 Muscle weakness (generalized): Secondary | ICD-10-CM | POA: Diagnosis not present

## 2017-04-10 DIAGNOSIS — M810 Age-related osteoporosis without current pathological fracture: Secondary | ICD-10-CM | POA: Diagnosis not present

## 2017-04-10 DIAGNOSIS — I10 Essential (primary) hypertension: Secondary | ICD-10-CM | POA: Diagnosis not present

## 2017-04-10 DIAGNOSIS — N3020 Other chronic cystitis without hematuria: Secondary | ICD-10-CM | POA: Diagnosis not present

## 2017-04-10 DIAGNOSIS — I471 Supraventricular tachycardia: Secondary | ICD-10-CM | POA: Diagnosis not present

## 2017-04-10 DIAGNOSIS — M1991 Primary osteoarthritis, unspecified site: Secondary | ICD-10-CM | POA: Diagnosis not present

## 2017-04-10 DIAGNOSIS — Z8673 Personal history of transient ischemic attack (TIA), and cerebral infarction without residual deficits: Secondary | ICD-10-CM | POA: Diagnosis not present

## 2017-04-10 NOTE — Telephone Encounter (Signed)
Pt made for 3/25, but daughter requested a sooner apt in [facility] clinic if possible. I scheduled her for an apt on Tuesday in [place] on 04/15/17

## 2017-04-10 NOTE — Telephone Encounter (Signed)
Patient requesting an apt with Dr. B to further discuss treatment options vs hospice/aggressive care.

## 2017-04-14 DIAGNOSIS — E1142 Type 2 diabetes mellitus with diabetic polyneuropathy: Secondary | ICD-10-CM | POA: Diagnosis not present

## 2017-04-14 DIAGNOSIS — I2699 Other pulmonary embolism without acute cor pulmonale: Secondary | ICD-10-CM | POA: Diagnosis not present

## 2017-04-14 DIAGNOSIS — Z8673 Personal history of transient ischemic attack (TIA), and cerebral infarction without residual deficits: Secondary | ICD-10-CM | POA: Diagnosis not present

## 2017-04-14 DIAGNOSIS — D638 Anemia in other chronic diseases classified elsewhere: Secondary | ICD-10-CM | POA: Diagnosis not present

## 2017-04-14 DIAGNOSIS — I10 Essential (primary) hypertension: Secondary | ICD-10-CM | POA: Diagnosis not present

## 2017-04-14 DIAGNOSIS — N3020 Other chronic cystitis without hematuria: Secondary | ICD-10-CM | POA: Diagnosis not present

## 2017-04-14 DIAGNOSIS — M1991 Primary osteoarthritis, unspecified site: Secondary | ICD-10-CM | POA: Diagnosis not present

## 2017-04-14 DIAGNOSIS — I471 Supraventricular tachycardia: Secondary | ICD-10-CM | POA: Diagnosis not present

## 2017-04-14 DIAGNOSIS — M6281 Muscle weakness (generalized): Secondary | ICD-10-CM | POA: Diagnosis not present

## 2017-04-14 DIAGNOSIS — M810 Age-related osteoporosis without current pathological fracture: Secondary | ICD-10-CM | POA: Diagnosis not present

## 2017-04-15 ENCOUNTER — Ambulatory Visit
Admission: RE | Admit: 2017-04-15 | Discharge: 2017-04-15 | Disposition: A | Discharge status: Home / Self Care | Payer: Medicare HMO | Source: Ambulatory Visit | Attending: Internal Medicine | Admitting: Internal Medicine

## 2017-04-15 ENCOUNTER — Inpatient Hospital Stay: Payer: Medicare HMO

## 2017-04-15 ENCOUNTER — Encounter: Payer: Self-pay | Admitting: Internal Medicine

## 2017-04-15 ENCOUNTER — Inpatient Hospital Stay (HOSPITAL_BASED_OUTPATIENT_CLINIC_OR_DEPARTMENT_OTHER): Payer: Medicare HMO | Admitting: Internal Medicine

## 2017-04-15 ENCOUNTER — Other Ambulatory Visit: Payer: Self-pay

## 2017-04-15 ENCOUNTER — Inpatient Hospital Stay: Payer: Medicare HMO | Attending: Internal Medicine

## 2017-04-15 VITALS — BP 136/76 | HR 66 | Temp 97.9°F | Resp 20

## 2017-04-15 DIAGNOSIS — R0609 Other forms of dyspnea: Principal | ICD-10-CM

## 2017-04-15 DIAGNOSIS — Z8673 Personal history of transient ischemic attack (TIA), and cerebral infarction without residual deficits: Secondary | ICD-10-CM | POA: Insufficient documentation

## 2017-04-15 DIAGNOSIS — C774 Secondary and unspecified malignant neoplasm of inguinal and lower limb lymph nodes: Secondary | ICD-10-CM | POA: Diagnosis not present

## 2017-04-15 DIAGNOSIS — Z90710 Acquired absence of both cervix and uterus: Secondary | ICD-10-CM

## 2017-04-15 DIAGNOSIS — R600 Localized edema: Secondary | ICD-10-CM | POA: Diagnosis not present

## 2017-04-15 DIAGNOSIS — I2699 Other pulmonary embolism without acute cor pulmonale: Secondary | ICD-10-CM | POA: Diagnosis not present

## 2017-04-15 DIAGNOSIS — C5701 Malignant neoplasm of right fallopian tube: Secondary | ICD-10-CM | POA: Insufficient documentation

## 2017-04-15 DIAGNOSIS — Z86718 Personal history of other venous thrombosis and embolism: Secondary | ICD-10-CM

## 2017-04-15 DIAGNOSIS — Z7901 Long term (current) use of anticoagulants: Secondary | ICD-10-CM | POA: Insufficient documentation

## 2017-04-15 DIAGNOSIS — Z95828 Presence of other vascular implants and grafts: Secondary | ICD-10-CM

## 2017-04-15 DIAGNOSIS — K449 Diaphragmatic hernia without obstruction or gangrene: Secondary | ICD-10-CM | POA: Diagnosis not present

## 2017-04-15 DIAGNOSIS — R0602 Shortness of breath: Secondary | ICD-10-CM

## 2017-04-15 LAB — COMPREHENSIVE METABOLIC PANEL
ALK PHOS: 59 U/L (ref 38–126)
ALT: 15 U/L (ref 14–54)
ANION GAP: 10 (ref 5–15)
AST: 27 U/L (ref 15–41)
Albumin: 3.5 g/dL (ref 3.5–5.0)
BUN: 21 mg/dL — ABNORMAL HIGH (ref 6–20)
CALCIUM: 8.9 mg/dL (ref 8.9–10.3)
CHLORIDE: 102 mmol/L (ref 101–111)
CO2: 27 mmol/L (ref 22–32)
CREATININE: 0.97 mg/dL (ref 0.44–1.00)
GFR, EST AFRICAN AMERICAN: 60 mL/min — AB (ref >60)
GFR, EST NON AFRICAN AMERICAN: 51 mL/min — AB (ref >60)
Glucose, Bld: 123 mg/dL — ABNORMAL HIGH (ref 65–99)
Potassium: 4 mmol/L (ref 3.5–5.1)
SODIUM: 139 mmol/L (ref 135–145)
Total Bilirubin: 0.3 mg/dL (ref 0.3–1.2)
Total Protein: 7.2 g/dL (ref 6.5–8.1)

## 2017-04-15 LAB — CBC WITH DIFFERENTIAL/PLATELET
Basophils Absolute: 0.1 10*3/uL (ref 0–0.1)
Basophils Relative: 3 %
EOS ABS: 0 10*3/uL (ref 0–0.7)
EOS PCT: 1 %
HCT: 34.8 % — ABNORMAL LOW (ref 35.0–47.0)
Hemoglobin: 11.5 g/dL — ABNORMAL LOW (ref 12.0–16.0)
LYMPHS ABS: 1.6 10*3/uL (ref 1.0–3.6)
LYMPHS PCT: 34 %
MCH: 29.6 pg (ref 26.0–34.0)
MCHC: 33 g/dL (ref 32.0–36.0)
MCV: 89.7 fL (ref 80.0–100.0)
MONO ABS: 0.6 10*3/uL (ref 0.2–0.9)
MONOS PCT: 13 %
Neutro Abs: 2.3 10*3/uL (ref 1.4–6.5)
Neutrophils Relative %: 49 %
PLATELETS: 379 10*3/uL (ref 150–440)
RBC: 3.88 MIL/uL (ref 3.80–5.20)
RDW: 15.4 % — ABNORMAL HIGH (ref 11.5–14.5)
WBC: 4.7 10*3/uL (ref 3.6–11.0)

## 2017-04-15 LAB — BRAIN NATRIURETIC PEPTIDE: B NATRIURETIC PEPTIDE 5: 194 pg/mL — AB (ref 0.0–100.0)

## 2017-04-15 MED ORDER — HEPARIN SOD (PORK) LOCK FLUSH 100 UNIT/ML IV SOLN
500.0000 [IU] | Freq: Once | INTRAVENOUS | Status: AC
  Administered 2017-04-15: 500 [IU] via INTRAVENOUS
  Filled 2017-04-15: qty 5

## 2017-04-15 MED ORDER — SODIUM CHLORIDE 0.9% FLUSH
10.0000 mL | INTRAVENOUS | Status: DC | PRN
  Administered 2017-04-15: 10 mL via INTRAVENOUS
  Filled 2017-04-15: qty 10

## 2017-04-15 NOTE — Progress Notes (Signed)
[facility] OFFICE PROGRESS NOTE  Patient Care Team: Ezequiel Kayser, [name]  No matching staging information was found for the patient.   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  Adequate TRS, no macroscopic residual. IP/IV chemotherapy with DDP and paclitaxel on GOG protocol, chemo and Bev consolidation completed in 03/2011 2. 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 November 14, 2011- Patient was started on carboplatin and gemcitabine 4. Finished 6 cycles of chemotherapy in April of 2014 with carboplatin and gemcitabine. Tolerance was fairly good except for neutropenia and thrombocytopenia. 5.recurrent disease by CT scan February of 2015  # .radiation therapy to pelvis  May of 2015  7.upper extremity  . and deep vein thrombosis associated with port.(June of 2015) patient started on   Blanco had port removed and had   thrombectomy September 06, 2013. # CT DEC 2016- progressing disease in the right inguinal area # FEB-TAXOL-AVASTIN;  # FEB 2017- AVASTIN q 3W; July 2017- CT- Improved  Right Inguinal LN; STOP AVASTIN sec to potential concerns of AEs  # AUG 2017 3rd- START ZEJULA- declines sec to intol  # OCT 7th CT- STABLE RIGHT INGUINAL LN- RE-START AVASTIN q 3W [STOPPED oct 25th 2018]       Malignant neoplasm of right fallopian tube [facility])     INTERVAL HISTORY:  CAROL THEYS 84 y.o.  female pleasant patient above history of Fallopian tube cancer metastatic is currently off therapy is here for follow-up.  Patient's last treatment was with Avastin; stopped October 2018 [last dose October 25]  Patient was admitted to the hospital [facility]hasia TIA/incidentally noted to have a PE.  Patient is currently on Eliquis 5 mg twice a day patient is currently in a rehab.  Patient denies any neurologic deficits at this time.  She is  chronic swelling in the legs right more the left which is not any worse.  This currently improved since she has been wearing stockings.  Patient complains of shortness of breath with exertion.  No wheezing or cough.  Her appetite is good.  No unusual back pain or chills.  Denies any abdominal distention. Denies any chest pain. No nausea no vomiting.   REVIEW OF SYSTEMS:  A complete 10 point review of system is done which is negative except mentioned above/history of present illness.   PAST MEDICAL HISTORY :  Past Medical History:  Diagnosis Date  . Bladder infection, chronic 10/19/2011  . Cancer (HCC)    Ovarian   . Carcinoma of fallopian tube ([place]) 10/05/2009   Overview:  Overview:  Overview:  Drs. Claiborne Rigg and Delorise Shiner Choksi Overview:  Drs. Claiborne Rigg and [employer]   . Concussion   . GERD (gastroesophageal reflux disease)   . MI (mitral incompetence) 10/27/2014   Overview:  MODERATE   . Mitral valve prolapse   . OAB (overactive bladder)   . Tachycardia     PAST SURGICAL HISTORY :   Past Surgical History:  Procedure Laterality Date  . ABDOMINAL HYSTERECTOMY    . CHOLECYSTECTOMY    . LAPAROSCOPIC BILATERAL SALPINGO OOPHERECTOMY  2011  . ovarian cancer surgery  2011    FAMILY HISTORY :   Family History  Problem Relation Age of Onset  . Ovarian cancer Other   . Breast cancer Neg Hx   . Colon cancer Neg Hx   . Diabetes Neg  Hx   . Heart disease Neg Hx   . Kidney disease Neg Hx   . Bladder Cancer Neg Hx     SOCIAL HISTORY:   Social History   Tobacco Use  . Smoking status: Never Smoker  . Smokeless tobacco: Never Used  Substance Use Topics  . Alcohol use: No  . Drug use: No    ALLERGIES:  is allergic to benadryl [diphenhydramine]; tamsulosin; alendronate sodium; duloxetine; duloxetine hcl; risedronate sodium; lovastatin; and sulfa antibiotics.  MEDICATIONS:  Current Outpatient Medications  Medication Sig Dispense Refill  . acetaminophen (TYLENOL) 650 MG CR  tablet Take 650 mg by mouth every 8 (eight) hours as needed for pain.    Marland Kitchen apixaban (ELIQUIS) 5 MG TABS tablet Take 1 tablet (5 mg total) by mouth 2 (two) times daily. 60 tablet 5  . Ascorbic Acid (VITAMIN C) 100 MG tablet Take 100 mg by mouth daily.    Marland Kitchen aspirin EC 81 MG tablet Take 81 mg by mouth.    . cetirizine (ZYRTEC) 10 MG tablet Take 10 mg by mouth daily.     . cholecalciferol (VITAMIN D) 1000 units tablet Take 1,000 Units by mouth daily.     Marland Kitchen docusate sodium (COLACE) 100 MG capsule Take 100 mg by mouth daily as needed for mild constipation.     . fluticasone (FLONASE) 50 MCG/ACT nasal spray Place 1 spray into both nostrils daily.     Marland Kitchen gabapentin (NEURONTIN) 600 MG tablet TAKE (1) TABLET BY MOUTH THREE TIMES A DAY (Patient taking differently: Take 600 mg by mouth 2 (two) times daily as needed. TAKE (1) TABLET BY MOUTH THREE TIMES A DAY) 180 tablet 0  . naproxen sodium (ANAPROX) 220 MG tablet Take 220 mg by mouth 2 (two) times daily with a meal.    . nitrofurantoin, macrocrystal-monohydrate, (MACROBID) 100 MG capsule Take 1 capsule (100 mg total) by mouth daily. 30 capsule 3  . ondansetron (ZOFRAN) 4 MG tablet Take 4 mg by mouth every 8 (eight) hours as needed for nausea or vomiting.    . pantoprazole (PROTONIX) 40 MG tablet Take 40 mg by mouth daily.      No current facility-administered medications for this visit.    Facility-Administered Medications Ordered in Other Visits  Medication Dose Route Frequency Provider Last Rate Last Dose  . diphenhydrAMINE (BENADRYL) injection 50 mg  50 mg Intravenous Once Choksi, Janak, MD      . sodium chloride 0.9 % injection 10 mL  10 mL Intravenous PRN Forest Gleason, MD   10 mL at 01/12/15 1116  . sodium chloride 0.9 % injection 10 mL  10 mL Intravenous PRN Choksi, Delorise Shiner, MD   10 mL at 02/16/15 1025    PHYSICAL EXAMINATION: ECOG PERFORMANCE STATUS: 2 - Symptomatic, <50% confined to bed  BP 136/76   Pulse 66   Temp 97.9 F (36.6 C) (Tympanic)    Resp 20   SpO2 98%   There were no vitals filed for this visit.  GENERAL: Well-nourished well-developed; Alert, no distress and comfortable.  She is walking herself. Accompanied by daughter-in-law.  She walks the rolling walker. EYES: no pallor or icterus OROPHARYNX: no thrush or ulceration; good dentition  NECK: supple, no masses felt LYMPH:  no palpable lymphadenopathy in the cervical, axillary; Right ingiunal LN ~3 cm in size.  LUNGS: clear to auscultation and  No wheeze or crackles HEART/CVS: regular rate & rhythm and no murmurs; chronic right lower extremity swelling- worse today.  Pulses intact.  ABDOMEN:abdomen soft, non-tender and normal bowel sounds Musculoskeletal:no cyanosis of digits and no clubbing  PSYCH: alert & oriented x 3 with fluent speech NEURO: no focal motor/sensory deficits SKIN:  no rashes or significant lesions-except for a callus bottom of the right foot.  LABORATORY DATA:  I have reviewed the data as listed    Component Value Date/Time   NA 139 04/15/2017 1329   NA 138 06/02/2014 0910   K 4.0 04/15/2017 1329   K 3.7 06/02/2014 0910   CL 102 04/15/2017 1329   CL 107 06/02/2014 0910   CO2 27 04/15/2017 1329   CO2 26 06/02/2014 0910   GLUCOSE 123 (H) 04/15/2017 1329   GLUCOSE 106 (H) 06/02/2014 0910   BUN 21 (H) 04/15/2017 1329   BUN 17 06/02/2014 0910   CREATININE 0.97 04/15/2017 1329   CREATININE 1.00 06/02/2014 0910   CALCIUM 8.9 04/15/2017 1329   CALCIUM 8.6 (L) 06/02/2014 0910   PROT 7.2 04/15/2017 1329   PROT 6.3 (L) 06/02/2014 0910   ALBUMIN 3.5 04/15/2017 1329   ALBUMIN 3.4 (L) 06/02/2014 0910   AST 27 04/15/2017 1329   AST 23 06/02/2014 0910   ALT 15 04/15/2017 1329   ALT 10 (L) 06/02/2014 0910   ALKPHOS 59 04/15/2017 1329   ALKPHOS 46 06/02/2014 0910   BILITOT 0.3 04/15/2017 1329   BILITOT 0.5 06/02/2014 0910   GFRNONAA 51 (L) 04/15/2017 1329   GFRNONAA 52 (L) 06/02/2014 0910   GFRAA 60 (L) 04/15/2017 1329   GFRAA >60 06/02/2014  0910    No results found for: SPEP, UPEP  Lab Results  Component Value Date   WBC 4.7 04/15/2017   NEUTROABS 2.3 04/15/2017   HGB 11.5 (L) 04/15/2017   HCT 34.8 (L) 04/15/2017   MCV 89.7 04/15/2017   PLT 379 04/15/2017      Chemistry      Component Value Date/Time   NA 139 04/15/2017 1329   NA 138 06/02/2014 0910   K 4.0 04/15/2017 1329   K 3.7 06/02/2014 0910   CL 102 04/15/2017 1329   CL 107 06/02/2014 0910   CO2 27 04/15/2017 1329   CO2 26 06/02/2014 0910   BUN 21 (H) 04/15/2017 1329   BUN 17 06/02/2014 0910   CREATININE 0.97 04/15/2017 1329   CREATININE 1.00 06/02/2014 0910      Component Value Date/Time   CALCIUM 8.9 04/15/2017 1329   CALCIUM 8.6 (L) 06/02/2014 0910   ALKPHOS 59 04/15/2017 1329   ALKPHOS 46 06/02/2014 0910   AST 27 04/15/2017 1329   AST 23 06/02/2014 0910   ALT 15 04/15/2017 1329   ALT 10 (L) 06/02/2014 0910   BILITOT 0.3 04/15/2017 1329   BILITOT 0.5 06/02/2014 0910     IMPRESSION: 1. No interval change from CT 07/30/2016. 2. Stable low-density necrotic lymph nodes in the RIGHT inguinal region. 3. Post hysterectomy. No evidence of local GYN malignancy recurrence   Electronically Signed   By: Suzy Bouchard M.D.   On: 11/27/2016 14:29 RADIOGRAPHIC STUDIES: I have personally reviewed the radiological images as listed and agreed with the findings in the report. Dg Chest 2 View  Result Date: 04/15/2017 CLINICAL DATA:  Exertional dyspnea.  Recent pulmonary embolism. EXAM: CHEST - 2 VIEW COMPARISON:  Chest x-ray dated March 19, 2013. FINDINGS: The patient is rotated to the right. There is a new loop at the apex of the right chest wall port catheter. The tip remains at the cavoatrial junction. The heart size  and mediastinal contours are within normal limits. Normal pulmonary vascularity. No focal consolidation, pleural effusion, or pneumothorax. Large hiatal hernia, unchanged. No acute osseous abnormality. Exaggerated thoracic kyphosis  again noted. Unchanged L3 compression fracture. IMPRESSION: 1.  No active cardiopulmonary disease. 2. Unchanged large hiatal hernia. Electronically Signed   By: Titus Dubin M.D.   On: 04/15/2017 15:14     ASSESSMENT & PLAN:  Malignant neoplasm of right fallopian tube [facility]) Metastatic fallopian tube cancer-platinum sensitive;  Most recently on Avastin since February 2017.  However Avastin discontinued because of side effects including recent TIA/stroke/PE.  Last Avastin November 28, 2016   # OCT 22nd CT scan- STABLE Lymphadenopathy in right groin. No obvious disease progression noted at this time.  I discussed regarding evaluation of the disease status with a CT scan.  Patient is not interested; she declines.  However family/daughter interested.  Daughter will call for CT scan if needed.  # SOB ?;  etiology CXR today. Clinically less likely again PE as pt is on Eliquis at this time.   # Acute R LLE PE- on eliquis  # TIA [oct]- 2018; contraindicated for the use of Avastin.  # follow up in 2 months/labs port flush ca-125; daughter will call re: CTscan if needed; add BNP.    Orders Placed This Encounter  Procedures  . DG Chest 2 View    Standing Status:   Future    Number of Occurrences:   1    Standing Expiration Date:   06/15/2018    Order Specific Question:   Reason for Exam (SYMPTOM  OR DIAGNOSIS REQUIRED)    Answer:   dyspnea    Order Specific Question:   Preferred imaging location?    Answer:   [facility]  . Brain natriuretic peptide    Standing Status:   Future    Number of Occurrences:   1    Standing Expiration Date:   04/16/2018  . CA 125    Standing Status:   Future    Standing Expiration Date:   04/15/2018  . CA 125    Standing Status:   Future    Number of Occurrences:   1    Standing Expiration Date:   04/15/2018      [name], MD 04/16/2017 7:56 AM

## 2017-04-15 NOTE — Assessment & Plan Note (Addendum)
Metastatic fallopian tube cancer-platinum sensitive;  Most recently on Avastin since February 2017.  However Avastin discontinued because of side effects including recent TIA/stroke/PE.  Last Avastin November 28, 2016   # OCT 22nd CT scan- STABLE Lymphadenopathy in right groin. No obvious disease progression noted at this time.  I discussed regarding evaluation of the disease status with a CT scan.  Patient is not interested; she declines.  However family/daughter interested.  Daughter will call for CT scan if needed.  # SOB ?;  etiology CXR today. Clinically less likely again PE as pt is on Eliquis at this time.   # Acute R LLE PE- on eliquis  # TIA [oct]- 2018; contraindicated for the use of Avastin.  # follow up in 2 months/labs port flush ca-125; daughter will call re: CTscan if needed; add BNP.

## 2017-04-15 NOTE — Progress Notes (Signed)
Patient here for follow-up for fallopian tube cancer. She is here to discuss the plan of care. Patient resides of [place].

## 2017-04-16 LAB — CA 125: CANCER ANTIGEN (CA) 125: 30.8 U/mL (ref 0.0–38.1)

## 2017-04-17 DIAGNOSIS — I2699 Other pulmonary embolism without acute cor pulmonale: Secondary | ICD-10-CM | POA: Diagnosis not present

## 2017-04-17 DIAGNOSIS — I10 Essential (primary) hypertension: Secondary | ICD-10-CM | POA: Diagnosis not present

## 2017-04-17 DIAGNOSIS — E1142 Type 2 diabetes mellitus with diabetic polyneuropathy: Secondary | ICD-10-CM | POA: Diagnosis not present

## 2017-04-17 DIAGNOSIS — D638 Anemia in other chronic diseases classified elsewhere: Secondary | ICD-10-CM | POA: Diagnosis not present

## 2017-04-17 DIAGNOSIS — I471 Supraventricular tachycardia: Secondary | ICD-10-CM | POA: Diagnosis not present

## 2017-04-17 DIAGNOSIS — M810 Age-related osteoporosis without current pathological fracture: Secondary | ICD-10-CM | POA: Diagnosis not present

## 2017-04-17 DIAGNOSIS — M1991 Primary osteoarthritis, unspecified site: Secondary | ICD-10-CM | POA: Diagnosis not present

## 2017-04-17 DIAGNOSIS — Z8673 Personal history of transient ischemic attack (TIA), and cerebral infarction without residual deficits: Secondary | ICD-10-CM | POA: Diagnosis not present

## 2017-04-17 DIAGNOSIS — N3020 Other chronic cystitis without hematuria: Secondary | ICD-10-CM | POA: Diagnosis not present

## 2017-04-17 DIAGNOSIS — M6281 Muscle weakness (generalized): Secondary | ICD-10-CM | POA: Diagnosis not present

## 2017-04-21 DIAGNOSIS — M6281 Muscle weakness (generalized): Secondary | ICD-10-CM | POA: Diagnosis not present

## 2017-04-21 DIAGNOSIS — D638 Anemia in other chronic diseases classified elsewhere: Secondary | ICD-10-CM | POA: Diagnosis not present

## 2017-04-21 DIAGNOSIS — I471 Supraventricular tachycardia: Secondary | ICD-10-CM | POA: Diagnosis not present

## 2017-04-21 DIAGNOSIS — E1142 Type 2 diabetes mellitus with diabetic polyneuropathy: Secondary | ICD-10-CM | POA: Diagnosis not present

## 2017-04-21 DIAGNOSIS — Z8673 Personal history of transient ischemic attack (TIA), and cerebral infarction without residual deficits: Secondary | ICD-10-CM | POA: Diagnosis not present

## 2017-04-21 DIAGNOSIS — I10 Essential (primary) hypertension: Secondary | ICD-10-CM | POA: Diagnosis not present

## 2017-04-21 DIAGNOSIS — M810 Age-related osteoporosis without current pathological fracture: Secondary | ICD-10-CM | POA: Diagnosis not present

## 2017-04-21 DIAGNOSIS — I2699 Other pulmonary embolism without acute cor pulmonale: Secondary | ICD-10-CM | POA: Diagnosis not present

## 2017-04-21 DIAGNOSIS — N3020 Other chronic cystitis without hematuria: Secondary | ICD-10-CM | POA: Diagnosis not present

## 2017-04-21 DIAGNOSIS — M1991 Primary osteoarthritis, unspecified site: Secondary | ICD-10-CM | POA: Diagnosis not present

## 2017-04-23 ENCOUNTER — Telehealth: Payer: Self-pay | Admitting: Internal Medicine

## 2017-04-23 NOTE — Telephone Encounter (Signed)
Chest x-ray normal. spoke to pt's daughter-Sharon- pt having "difficult breathing episodes" at times even at rest; ? Anxiety episodes rather than true cause of shortness of breath.  Also fatigued-question thyroid/depression/anxiety  Patient is appointment with PCP next week; asked the daughter to bring up about issues with PCP.  Will hold off a CT scan of the abdomen pelvis given multiple issues at this time/patient reluctant with any further therapy [as per the daughter]  FYI-

## 2017-04-24 DIAGNOSIS — E1142 Type 2 diabetes mellitus with diabetic polyneuropathy: Secondary | ICD-10-CM | POA: Diagnosis not present

## 2017-04-24 DIAGNOSIS — D638 Anemia in other chronic diseases classified elsewhere: Secondary | ICD-10-CM | POA: Diagnosis not present

## 2017-04-24 DIAGNOSIS — I10 Essential (primary) hypertension: Secondary | ICD-10-CM | POA: Diagnosis not present

## 2017-04-24 DIAGNOSIS — M6281 Muscle weakness (generalized): Secondary | ICD-10-CM | POA: Diagnosis not present

## 2017-04-24 DIAGNOSIS — M1991 Primary osteoarthritis, unspecified site: Secondary | ICD-10-CM | POA: Diagnosis not present

## 2017-04-24 DIAGNOSIS — I2699 Other pulmonary embolism without acute cor pulmonale: Secondary | ICD-10-CM | POA: Diagnosis not present

## 2017-04-24 DIAGNOSIS — Z8673 Personal history of transient ischemic attack (TIA), and cerebral infarction without residual deficits: Secondary | ICD-10-CM | POA: Diagnosis not present

## 2017-04-24 DIAGNOSIS — N3020 Other chronic cystitis without hematuria: Secondary | ICD-10-CM | POA: Diagnosis not present

## 2017-04-24 DIAGNOSIS — I471 Supraventricular tachycardia: Secondary | ICD-10-CM | POA: Diagnosis not present

## 2017-04-24 DIAGNOSIS — M810 Age-related osteoporosis without current pathological fracture: Secondary | ICD-10-CM | POA: Diagnosis not present

## 2017-04-28 ENCOUNTER — Other Ambulatory Visit: Payer: Medicare HMO

## 2017-04-28 ENCOUNTER — Ambulatory Visit: Payer: Medicare HMO | Admitting: Internal Medicine

## 2017-04-28 DIAGNOSIS — N3020 Other chronic cystitis without hematuria: Secondary | ICD-10-CM | POA: Diagnosis not present

## 2017-04-28 DIAGNOSIS — M1991 Primary osteoarthritis, unspecified site: Secondary | ICD-10-CM | POA: Diagnosis not present

## 2017-04-28 DIAGNOSIS — I10 Essential (primary) hypertension: Secondary | ICD-10-CM | POA: Diagnosis not present

## 2017-04-28 DIAGNOSIS — I2699 Other pulmonary embolism without acute cor pulmonale: Secondary | ICD-10-CM | POA: Diagnosis not present

## 2017-04-28 DIAGNOSIS — Z8673 Personal history of transient ischemic attack (TIA), and cerebral infarction without residual deficits: Secondary | ICD-10-CM | POA: Diagnosis not present

## 2017-04-28 DIAGNOSIS — I471 Supraventricular tachycardia: Secondary | ICD-10-CM | POA: Diagnosis not present

## 2017-04-28 DIAGNOSIS — E1142 Type 2 diabetes mellitus with diabetic polyneuropathy: Secondary | ICD-10-CM | POA: Diagnosis not present

## 2017-04-28 DIAGNOSIS — D638 Anemia in other chronic diseases classified elsewhere: Secondary | ICD-10-CM | POA: Diagnosis not present

## 2017-04-28 DIAGNOSIS — M810 Age-related osteoporosis without current pathological fracture: Secondary | ICD-10-CM | POA: Diagnosis not present

## 2017-04-28 DIAGNOSIS — M6281 Muscle weakness (generalized): Secondary | ICD-10-CM | POA: Diagnosis not present

## 2017-05-01 DIAGNOSIS — Z8673 Personal history of transient ischemic attack (TIA), and cerebral infarction without residual deficits: Secondary | ICD-10-CM | POA: Diagnosis not present

## 2017-05-01 DIAGNOSIS — M810 Age-related osteoporosis without current pathological fracture: Secondary | ICD-10-CM | POA: Diagnosis not present

## 2017-05-01 DIAGNOSIS — I2699 Other pulmonary embolism without acute cor pulmonale: Secondary | ICD-10-CM | POA: Diagnosis not present

## 2017-05-01 DIAGNOSIS — D638 Anemia in other chronic diseases classified elsewhere: Secondary | ICD-10-CM | POA: Diagnosis not present

## 2017-05-01 DIAGNOSIS — N3020 Other chronic cystitis without hematuria: Secondary | ICD-10-CM | POA: Diagnosis not present

## 2017-05-01 DIAGNOSIS — M6281 Muscle weakness (generalized): Secondary | ICD-10-CM | POA: Diagnosis not present

## 2017-05-01 DIAGNOSIS — I471 Supraventricular tachycardia: Secondary | ICD-10-CM | POA: Diagnosis not present

## 2017-05-01 DIAGNOSIS — M1991 Primary osteoarthritis, unspecified site: Secondary | ICD-10-CM | POA: Diagnosis not present

## 2017-05-01 DIAGNOSIS — E1142 Type 2 diabetes mellitus with diabetic polyneuropathy: Secondary | ICD-10-CM | POA: Diagnosis not present

## 2017-05-01 DIAGNOSIS — I10 Essential (primary) hypertension: Secondary | ICD-10-CM | POA: Diagnosis not present

## 2017-05-05 DIAGNOSIS — E538 Deficiency of other specified B group vitamins: Secondary | ICD-10-CM | POA: Diagnosis not present

## 2017-05-05 DIAGNOSIS — N390 Urinary tract infection, site not specified: Secondary | ICD-10-CM | POA: Diagnosis not present

## 2017-05-05 DIAGNOSIS — E1122 Type 2 diabetes mellitus with diabetic chronic kidney disease: Secondary | ICD-10-CM | POA: Diagnosis not present

## 2017-05-05 DIAGNOSIS — I471 Supraventricular tachycardia: Secondary | ICD-10-CM | POA: Diagnosis not present

## 2017-05-05 DIAGNOSIS — G620 Drug-induced polyneuropathy: Secondary | ICD-10-CM | POA: Diagnosis not present

## 2017-05-05 DIAGNOSIS — N8110 Cystocele, unspecified: Secondary | ICD-10-CM | POA: Diagnosis not present

## 2017-05-05 DIAGNOSIS — E559 Vitamin D deficiency, unspecified: Secondary | ICD-10-CM | POA: Diagnosis not present

## 2017-05-05 DIAGNOSIS — N183 Chronic kidney disease, stage 3 (moderate): Secondary | ICD-10-CM | POA: Diagnosis not present

## 2017-05-05 DIAGNOSIS — Z7901 Long term (current) use of anticoagulants: Secondary | ICD-10-CM | POA: Diagnosis not present

## 2017-05-05 DIAGNOSIS — M810 Age-related osteoporosis without current pathological fracture: Secondary | ICD-10-CM | POA: Diagnosis not present

## 2017-05-05 DIAGNOSIS — I10 Essential (primary) hypertension: Secondary | ICD-10-CM | POA: Diagnosis not present

## 2017-05-05 DIAGNOSIS — Z79899 Other long term (current) drug therapy: Secondary | ICD-10-CM | POA: Diagnosis not present

## 2017-05-06 DIAGNOSIS — I471 Supraventricular tachycardia: Secondary | ICD-10-CM | POA: Diagnosis not present

## 2017-05-06 DIAGNOSIS — M1991 Primary osteoarthritis, unspecified site: Secondary | ICD-10-CM | POA: Diagnosis not present

## 2017-05-06 DIAGNOSIS — E1142 Type 2 diabetes mellitus with diabetic polyneuropathy: Secondary | ICD-10-CM | POA: Diagnosis not present

## 2017-05-06 DIAGNOSIS — N8110 Cystocele, unspecified: Secondary | ICD-10-CM | POA: Diagnosis not present

## 2017-05-06 DIAGNOSIS — I2699 Other pulmonary embolism without acute cor pulmonale: Secondary | ICD-10-CM | POA: Diagnosis not present

## 2017-05-06 DIAGNOSIS — Z8673 Personal history of transient ischemic attack (TIA), and cerebral infarction without residual deficits: Secondary | ICD-10-CM | POA: Diagnosis not present

## 2017-05-06 DIAGNOSIS — N3020 Other chronic cystitis without hematuria: Secondary | ICD-10-CM | POA: Diagnosis not present

## 2017-05-06 DIAGNOSIS — E1122 Type 2 diabetes mellitus with diabetic chronic kidney disease: Secondary | ICD-10-CM | POA: Diagnosis not present

## 2017-05-06 DIAGNOSIS — I10 Essential (primary) hypertension: Secondary | ICD-10-CM | POA: Diagnosis not present

## 2017-05-06 DIAGNOSIS — N390 Urinary tract infection, site not specified: Secondary | ICD-10-CM | POA: Diagnosis not present

## 2017-05-06 DIAGNOSIS — D638 Anemia in other chronic diseases classified elsewhere: Secondary | ICD-10-CM | POA: Diagnosis not present

## 2017-05-06 DIAGNOSIS — M6281 Muscle weakness (generalized): Secondary | ICD-10-CM | POA: Diagnosis not present

## 2017-05-06 DIAGNOSIS — M810 Age-related osteoporosis without current pathological fracture: Secondary | ICD-10-CM | POA: Diagnosis not present

## 2017-05-06 DIAGNOSIS — N183 Chronic kidney disease, stage 3 (moderate): Secondary | ICD-10-CM | POA: Diagnosis not present

## 2017-05-08 ENCOUNTER — Encounter: Payer: Medicare HMO | Admitting: Obstetrics and Gynecology

## 2017-05-08 DIAGNOSIS — I2699 Other pulmonary embolism without acute cor pulmonale: Secondary | ICD-10-CM | POA: Diagnosis not present

## 2017-05-08 DIAGNOSIS — N3020 Other chronic cystitis without hematuria: Secondary | ICD-10-CM | POA: Diagnosis not present

## 2017-05-08 DIAGNOSIS — I10 Essential (primary) hypertension: Secondary | ICD-10-CM | POA: Diagnosis not present

## 2017-05-08 DIAGNOSIS — M1991 Primary osteoarthritis, unspecified site: Secondary | ICD-10-CM | POA: Diagnosis not present

## 2017-05-08 DIAGNOSIS — E1142 Type 2 diabetes mellitus with diabetic polyneuropathy: Secondary | ICD-10-CM | POA: Diagnosis not present

## 2017-05-08 DIAGNOSIS — M6281 Muscle weakness (generalized): Secondary | ICD-10-CM | POA: Diagnosis not present

## 2017-05-08 DIAGNOSIS — D638 Anemia in other chronic diseases classified elsewhere: Secondary | ICD-10-CM | POA: Diagnosis not present

## 2017-05-08 DIAGNOSIS — I471 Supraventricular tachycardia: Secondary | ICD-10-CM | POA: Diagnosis not present

## 2017-05-08 DIAGNOSIS — M810 Age-related osteoporosis without current pathological fracture: Secondary | ICD-10-CM | POA: Diagnosis not present

## 2017-05-08 DIAGNOSIS — Z8673 Personal history of transient ischemic attack (TIA), and cerebral infarction without residual deficits: Secondary | ICD-10-CM | POA: Diagnosis not present

## 2017-05-12 DIAGNOSIS — Z8673 Personal history of transient ischemic attack (TIA), and cerebral infarction without residual deficits: Secondary | ICD-10-CM | POA: Diagnosis not present

## 2017-05-12 DIAGNOSIS — M6281 Muscle weakness (generalized): Secondary | ICD-10-CM | POA: Diagnosis not present

## 2017-05-12 DIAGNOSIS — I2699 Other pulmonary embolism without acute cor pulmonale: Secondary | ICD-10-CM | POA: Diagnosis not present

## 2017-05-12 DIAGNOSIS — I471 Supraventricular tachycardia: Secondary | ICD-10-CM | POA: Diagnosis not present

## 2017-05-12 DIAGNOSIS — M810 Age-related osteoporosis without current pathological fracture: Secondary | ICD-10-CM | POA: Diagnosis not present

## 2017-05-12 DIAGNOSIS — E1142 Type 2 diabetes mellitus with diabetic polyneuropathy: Secondary | ICD-10-CM | POA: Diagnosis not present

## 2017-05-12 DIAGNOSIS — N3020 Other chronic cystitis without hematuria: Secondary | ICD-10-CM | POA: Diagnosis not present

## 2017-05-12 DIAGNOSIS — I10 Essential (primary) hypertension: Secondary | ICD-10-CM | POA: Diagnosis not present

## 2017-05-12 DIAGNOSIS — M1991 Primary osteoarthritis, unspecified site: Secondary | ICD-10-CM | POA: Diagnosis not present

## 2017-05-12 DIAGNOSIS — D638 Anemia in other chronic diseases classified elsewhere: Secondary | ICD-10-CM | POA: Diagnosis not present

## 2017-05-13 DIAGNOSIS — Z8673 Personal history of transient ischemic attack (TIA), and cerebral infarction without residual deficits: Secondary | ICD-10-CM | POA: Diagnosis not present

## 2017-05-13 DIAGNOSIS — D638 Anemia in other chronic diseases classified elsewhere: Secondary | ICD-10-CM | POA: Diagnosis not present

## 2017-05-13 DIAGNOSIS — N3020 Other chronic cystitis without hematuria: Secondary | ICD-10-CM | POA: Diagnosis not present

## 2017-05-13 DIAGNOSIS — M1991 Primary osteoarthritis, unspecified site: Secondary | ICD-10-CM | POA: Diagnosis not present

## 2017-05-13 DIAGNOSIS — M6281 Muscle weakness (generalized): Secondary | ICD-10-CM | POA: Diagnosis not present

## 2017-05-13 DIAGNOSIS — I471 Supraventricular tachycardia: Secondary | ICD-10-CM | POA: Diagnosis not present

## 2017-05-13 DIAGNOSIS — I10 Essential (primary) hypertension: Secondary | ICD-10-CM | POA: Diagnosis not present

## 2017-05-13 DIAGNOSIS — E1142 Type 2 diabetes mellitus with diabetic polyneuropathy: Secondary | ICD-10-CM | POA: Diagnosis not present

## 2017-05-13 DIAGNOSIS — I2699 Other pulmonary embolism without acute cor pulmonale: Secondary | ICD-10-CM | POA: Diagnosis not present

## 2017-05-13 DIAGNOSIS — M810 Age-related osteoporosis without current pathological fracture: Secondary | ICD-10-CM | POA: Diagnosis not present

## 2017-05-14 ENCOUNTER — Ambulatory Visit (INDEPENDENT_AMBULATORY_CARE_PROVIDER_SITE_OTHER): Payer: Medicare HMO | Admitting: Obstetrics and Gynecology

## 2017-05-14 ENCOUNTER — Encounter: Payer: Self-pay | Admitting: Obstetrics and Gynecology

## 2017-05-14 VITALS — BP 129/79 | HR 83 | Ht 60.0 in | Wt 135.3 lb

## 2017-05-14 DIAGNOSIS — N952 Postmenopausal atrophic vaginitis: Secondary | ICD-10-CM

## 2017-05-14 DIAGNOSIS — N8111 Cystocele, midline: Secondary | ICD-10-CM | POA: Diagnosis not present

## 2017-05-14 DIAGNOSIS — C5700 Malignant neoplasm of unspecified fallopian tube: Secondary | ICD-10-CM

## 2017-05-14 DIAGNOSIS — Z90710 Acquired absence of both cervix and uterus: Secondary | ICD-10-CM | POA: Diagnosis not present

## 2017-05-14 DIAGNOSIS — Z90721 Acquired absence of ovaries, unilateral: Secondary | ICD-10-CM | POA: Diagnosis not present

## 2017-05-14 DIAGNOSIS — Z4689 Encounter for fitting and adjustment of other specified devices: Secondary | ICD-10-CM | POA: Diagnosis not present

## 2017-05-14 DIAGNOSIS — R350 Frequency of micturition: Secondary | ICD-10-CM | POA: Diagnosis not present

## 2017-05-14 LAB — POCT URINALYSIS DIPSTICK
BILIRUBIN UA: NEGATIVE
Blood, UA: NEGATIVE
GLUCOSE UA: NEGATIVE
KETONES UA: NEGATIVE
Leukocytes, UA: NEGATIVE
Nitrite, UA: NEGATIVE
ODOR: NEGATIVE
Protein, UA: NEGATIVE
Spec Grav, UA: <=1.005 — AB (ref 1.010–1.025)
Urobilinogen, UA: 0.2 E.U./dL
pH, UA: 7.5 (ref 5.0–8.0)

## 2017-05-14 MED ORDER — NYSTATIN 100000 UNIT/GM EX OINT: 1.0000 "application " | TOPICAL_OINTMENT | Freq: Two times a day (BID) | CUTANEOUS | 1 refills | Status: DC

## 2017-05-14 MED ORDER — TRIAMCINOLONE ACETONIDE 0.1 % EX OINT: 1.0000 "application " | TOPICAL_OINTMENT | Freq: Two times a day (BID) | CUTANEOUS | 1 refills | Status: DC

## 2017-05-14 NOTE — Patient Instructions (Addendum)
1.  Return in 6 months for pessary maintenance 2.  Book reference-[employer] by Staci Righter

## 2017-05-14 NOTE — Progress Notes (Signed)
Chief complaint: 1.  Pessary maintenance  Nicole Clayton presents for pessary maintenance. She is status post hysterectomy with oophorectomy. She has discontinued therapy for fallopian tube cancer at this time. Status post cardiac event in December 2018 Status post TIA December 2018 Midline cystocele, grade 2, with incomplete bladder emptying, controlled with pessary Ring with support pessary Currently not using any intravaginal medications History of recurrent UTIs Patient reports no significant vaginal bleeding, vaginal discharge, pelvic pain, or vaginal odor  Past medical history: Past surgical history, problem list, medications, and allergies are reviewed  OBJECTIVE: BP 129/79   Pulse 83   Ht 5' (1.524 m)   Wt 135 lb 4.8 oz (61.4 kg)   BMI 26.42 kg/m  Pleasant elderly female in no acute distress. She is alert and oriented. Abdomen Abdomen: Soft, nontender without organomegaly Pelvic exam: External genitalia: Atrophic changes; the 3-4 lymph nodes are still palpable in her right inguinal region and are essentially unchanged (2 x 3 cm matted nodes) BUS-urethral caruncle Vagina-moderate atrophy; vaginal cuff intact; no significant vaginal discharge or ulceration Cervix-surgically absent Uterus-surgically absent Bimanual-palpable masses or tenderness rectovaginal-normal external exam Bladder-nontender RV-mild perianal inflammation present  PROCEDURE: Pessary is removed, cleaned, and reinserted  ASSESSMENT: 1. Cystocele, grade 2, with history of incomplete bladder emptying 2. Normal pessary maintenance 3. History of fallopian tube cancer with recurrence in the right inguinal region; personal decision has been made to discontinue therapy at this time-clinically stable  PLAN: 1. Ring with support pessary is removed, cleaned, and reinserted 2. Return in 6 months for follow-up, or sooner if vaginal discharge, vaginal bleeding, or pelvic pain developed 3. No intravaginal medications are  being used 4.  End of life issues discussed. 5.  Nystatin and triamcinolone creams are prescribed for perianal irritation  A total of 25 minutes were spent face-to-face with the patient during this encounter and over half of that time involved counseling and coordination of care.  Brayton Mars, MD  Note: This dictation was prepared with Dragon dictation along with smaller phrase technology. Any transcriptional errors that result from this process are unintentional.

## 2017-05-16 ENCOUNTER — Telehealth: Payer: Self-pay

## 2017-05-16 LAB — URINE CULTURE

## 2017-05-16 MED ORDER — CIPROFLOXACIN HCL 500 MG PO TABS: 500.0000 mg | ORAL_TABLET | Freq: Two times a day (BID) | ORAL | 0 refills | Status: DC

## 2017-05-16 NOTE — Telephone Encounter (Signed)
-----   Message from Brayton Mars, MD sent at 05/16/2017  3:56 PM EDT ----- Please notify - Abnormal Labs Cipro 500 mg twice a day for 3 days

## 2017-05-16 NOTE — Telephone Encounter (Signed)
Pts daughter(Sharon) aware. Med erx. Per daughter request will fax lab to [facility] - [phone].

## 2017-05-20 ENCOUNTER — Telehealth: Payer: Self-pay | Admitting: *Deleted

## 2017-05-20 NOTE — Telephone Encounter (Signed)
Pts daughter sharon did get atb and pt is taking it.

## 2017-05-20 NOTE — Telephone Encounter (Signed)
Theodosia Blender from  [facility] calling about the patient . She states the patient seen Dr. Tennis Must and was prescribed Cipro. They never recieved the RX and did not take the medication. Patient needs the Cipro sent to [facility] in [place]. Please advise. Thank you

## 2017-05-20 NOTE — Telephone Encounter (Signed)
[place] called back and Patient states family picked up medication and she has took the full does of the Cipro. Please disregard previous message. Thank you

## 2017-06-10 ENCOUNTER — Inpatient Hospital Stay: Payer: Medicare HMO | Attending: Internal Medicine | Admitting: Internal Medicine

## 2017-06-10 ENCOUNTER — Other Ambulatory Visit: Payer: Self-pay

## 2017-06-10 ENCOUNTER — Inpatient Hospital Stay: Payer: Medicare HMO

## 2017-06-10 VITALS — BP 122/72 | HR 72 | Temp 95.7°F | Resp 18 | Wt 137.1 lb

## 2017-06-10 DIAGNOSIS — R109 Unspecified abdominal pain: Secondary | ICD-10-CM | POA: Insufficient documentation

## 2017-06-10 DIAGNOSIS — R5383 Other fatigue: Secondary | ICD-10-CM | POA: Diagnosis not present

## 2017-06-10 DIAGNOSIS — R5381 Other malaise: Secondary | ICD-10-CM | POA: Diagnosis not present

## 2017-06-10 DIAGNOSIS — Z923 Personal history of irradiation: Secondary | ICD-10-CM | POA: Insufficient documentation

## 2017-06-10 DIAGNOSIS — Z8673 Personal history of transient ischemic attack (TIA), and cerebral infarction without residual deficits: Secondary | ICD-10-CM | POA: Diagnosis not present

## 2017-06-10 DIAGNOSIS — M7989 Other specified soft tissue disorders: Secondary | ICD-10-CM | POA: Insufficient documentation

## 2017-06-10 DIAGNOSIS — R140 Abdominal distension (gaseous): Secondary | ICD-10-CM | POA: Diagnosis not present

## 2017-06-10 DIAGNOSIS — G893 Neoplasm related pain (acute) (chronic): Secondary | ICD-10-CM | POA: Diagnosis not present

## 2017-06-10 DIAGNOSIS — C774 Secondary and unspecified malignant neoplasm of inguinal and lower limb lymph nodes: Secondary | ICD-10-CM | POA: Insufficient documentation

## 2017-06-10 DIAGNOSIS — I2699 Other pulmonary embolism without acute cor pulmonale: Secondary | ICD-10-CM | POA: Diagnosis not present

## 2017-06-10 DIAGNOSIS — R978 Other abnormal tumor markers: Secondary | ICD-10-CM | POA: Insufficient documentation

## 2017-06-10 DIAGNOSIS — M79661 Pain in right lower leg: Secondary | ICD-10-CM | POA: Diagnosis not present

## 2017-06-10 DIAGNOSIS — C5701 Malignant neoplasm of right fallopian tube: Secondary | ICD-10-CM | POA: Diagnosis not present

## 2017-06-10 DIAGNOSIS — Z90710 Acquired absence of both cervix and uterus: Secondary | ICD-10-CM

## 2017-06-10 DIAGNOSIS — Z7901 Long term (current) use of anticoagulants: Secondary | ICD-10-CM | POA: Diagnosis not present

## 2017-06-10 MED ORDER — HEPARIN SOD (PORK) LOCK FLUSH 100 UNIT/ML IV SOLN
500.0000 [IU] | Freq: Once | INTRAVENOUS | Status: AC
  Administered 2017-06-10: 500 [IU] via INTRAVENOUS

## 2017-06-10 MED ORDER — SODIUM CHLORIDE 0.9% FLUSH
10.0000 mL | INTRAVENOUS | Status: DC | PRN
  Administered 2017-06-10: 10 mL via INTRAVENOUS
  Filled 2017-06-10: qty 10

## 2017-06-10 NOTE — Progress Notes (Signed)
[place] OFFICE PROGRESS NOTE  Patient Care Team: Ezequiel Kayser, [name]  Cancer Staging No matching staging information was found for the patient.   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  Adequate TRS, no macroscopic residual. IP/IV chemotherapy with DDP and paclitaxel on GOG protocol, chemo and Bev consolidation completed in 03/2011 2. 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 November 14, 2011- Patient was started on carboplatin and gemcitabine 4. Finished 6 cycles of chemotherapy in April of 2014 with carboplatin and gemcitabine. Tolerance was fairly good except for neutropenia and thrombocytopenia. 5.recurrent disease by CT scan February of 2015  # .radiation therapy to pelvis  May of 2015  7.upper extremity  . and deep vein thrombosis associated with port.(June of 2015) patient started on   [place] had port removed and had   thrombectomy September 06, 2013. # CT DEC 2016- progressing disease in the right inguinal area # FEB-TAXOL-AVASTIN;  # FEB 2017- AVASTIN q 3W; July 2017- CT- Improved  Right Inguinal LN; STOP AVASTIN sec to potential concerns of AEs  # AUG 2017 3rd- START ZEJULA- declines sec to intol  # OCT 7th CT- STABLE RIGHT INGUINAL LN- RE-START AVASTIN q 3W [STOPPED oct 25th 2018]       Malignant neoplasm of right fallopian tube [facility])      INTERVAL HISTORY:  Nicole Clayton 84 y.o.  female pleasant patient above history of high-grade serous ovarian cancer-currently off treatment/Avastin-because of recent stroke/PE-is here for follow-up.  Patient complains of worsening swelling of the right lower extremity compared to the left.  She also notes to have discomfort in the abdomen.  No constipation.  Complains of distention.  No significant nausea vomiting.  Otherwise no new shortness of breath or  cough.  Review of Systems  Constitutional: Positive for malaise/fatigue. Negative for chills, diaphoresis, fever and weight loss.  HENT: Negative for nosebleeds and sore throat.   Eyes: Negative for double vision.  Respiratory: Negative for cough, hemoptysis, sputum production, shortness of breath and wheezing.   Cardiovascular: Positive for leg swelling. Negative for chest pain, palpitations and orthopnea.  Gastrointestinal: Positive for abdominal pain. Negative for blood in stool, constipation, diarrhea, heartburn, melena, nausea and vomiting.  Genitourinary: Negative for dysuria, frequency and urgency.  Musculoskeletal: Negative for back pain and joint pain.  Skin: Negative.  Negative for itching and rash.  Neurological: Negative for dizziness, tingling, focal weakness, weakness and headaches.  Endo/Heme/Allergies: Does not bruise/bleed easily.  Psychiatric/Behavioral: Negative for depression. The patient is not nervous/anxious and does not have insomnia.       PAST MEDICAL HISTORY :  Past Medical History:  Diagnosis Date  . Bladder infection, chronic 10/19/2011  . Cancer (HCC)    Ovarian   . Carcinoma of fallopian tube ([place]) 10/05/2009   Overview:  Overview:  Overview:  Drs. Claiborne Rigg and Delorise Shiner Choksi Overview:  Drs. Claiborne Rigg and [employer]   . Concussion   . GERD (gastroesophageal reflux disease)   . MI (mitral incompetence) 10/27/2014   Overview:  MODERATE   . Mitral valve prolapse   . OAB (overactive bladder)   . Pulmonary embolism ([place])   . Tachycardia     PAST SURGICAL HISTORY :   Past Surgical History:  Procedure Laterality Date  . ABDOMINAL HYSTERECTOMY    . CHOLECYSTECTOMY    . LAPAROSCOPIC BILATERAL SALPINGO  OOPHERECTOMY  2011  . ovarian cancer surgery  2011    FAMILY HISTORY :   Family History  Problem Relation Age of Onset  . Ovarian cancer Other   . Breast cancer Neg Hx   . Colon cancer Neg Hx   . Diabetes Neg Hx   . Heart disease Neg Hx    . Kidney disease Neg Hx   . Bladder Cancer Neg Hx     SOCIAL HISTORY:   Social History   Tobacco Use  . Smoking status: Never Smoker  . Smokeless tobacco: Never Used  Substance Use Topics  . Alcohol use: No  . Drug use: No    ALLERGIES:  is allergic to benadryl [diphenhydramine]; tamsulosin; alendronate sodium; duloxetine; duloxetine hcl; risedronate sodium; lovastatin; and sulfa antibiotics.  MEDICATIONS:  Current Outpatient Medications  Medication Sig Dispense Refill  . apixaban (ELIQUIS) 5 MG TABS tablet Take 1 tablet (5 mg total) by mouth 2 (two) times daily. 60 tablet 5  . Ascorbic Acid (VITAMIN C) 100 MG tablet Take 100 mg by mouth daily.    . cephALEXin (KEFLEX) 250 MG capsule Take 250 mg by mouth.     . cetirizine (ZYRTEC) 10 MG tablet Take 10 mg by mouth daily.     . cholecalciferol (VITAMIN D) 1000 units tablet Take 1,000 Units by mouth daily.     Marland Kitchen docusate sodium (COLACE) 100 MG capsule Take 100 mg by mouth daily as needed for mild constipation.     . fluticasone (FLONASE) 50 MCG/ACT nasal spray Place 1 spray into both nostrils daily.     Marland Kitchen gabapentin (NEURONTIN) 600 MG tablet TAKE (1) TABLET BY MOUTH THREE TIMES A DAY (Patient taking differently: Take 600 mg by mouth 2 (two) times daily as needed. TAKE (1) TABLET BY MOUTH THREE TIMES A DAY) 180 tablet 0  . metoprolol succinate (TOPROL-XL) 50 MG 24 hr tablet     . nystatin ointment (MYCOSTATIN) Apply 1 application topically 2 (two) times daily. 30 g 1  . pantoprazole (PROTONIX) 40 MG tablet Take 40 mg by mouth daily.     Marland Kitchen acetaminophen (TYLENOL) 650 MG CR tablet Take 650 mg by mouth every 8 (eight) hours as needed for pain.    Marland Kitchen aspirin EC 81 MG tablet Take 81 mg by mouth.    . naproxen sodium (ANAPROX) 220 MG tablet Take 220 mg by mouth 2 (two) times daily with a meal.    . ondansetron (ZOFRAN) 4 MG tablet Take 4 mg by mouth every 8 (eight) hours as needed for nausea or vomiting.    . triamcinolone ointment  (KENALOG) 0.1 % Apply 1 application topically 2 (two) times daily. (Patient not taking: Reported on 06/10/2017) 30 g 1   No current facility-administered medications for this visit.    Facility-Administered Medications Ordered in Other Visits  Medication Dose Route Frequency Provider Last Rate Last Dose  . diphenhydrAMINE (BENADRYL) injection 50 mg  50 mg Intravenous Once Choksi, Janak, MD      . sodium chloride 0.9 % injection 10 mL  10 mL Intravenous PRN Forest Gleason, MD   10 mL at 01/12/15 1116  . sodium chloride 0.9 % injection 10 mL  10 mL Intravenous PRN Choksi, Delorise Shiner, MD   10 mL at 02/16/15 1025    PHYSICAL EXAMINATION: ECOG PERFORMANCE STATUS: 1 - Symptomatic but completely ambulatory  BP 122/72 (BP Location: Left Arm, Patient Position: Sitting)   Pulse 72   Temp (!) 95.7 F (35.4 C) (  Tympanic)   Resp 18   Wt 137 lb 2 oz (62.2 kg)   BMI 26.78 kg/m   Filed Weights   06/10/17 1433  Weight: 137 lb 2 oz (62.2 kg)    GENERAL: Well-nourished well-developed; Alert, no distress and comfortable.  Accompanied by family.  Walks with a walker. EYES: no pallor or icterus OROPHARYNX: no thrush or ulceration; NECK: supple; no lymph nodes felt. LYMPH:  no palpable lymphadenopathy in the axillary or inguinal regions LUNGS: Decreased breath sounds auscultation bilaterally. No wheeze or crackles HEART/CVS: regular rate & rhythm and no murmurs; right lower extremities swollen compared to the left.  ABDOMEN:abdomen soft, non-tender and normal bowel sounds. No hepatomegaly or splenomegaly.  Musculoskeletal:no cyanosis of digits and no clubbing  PSYCH: alert & oriented x 3 with fluent speech NEURO: no focal motor/sensory deficits SKIN:  no rashes or significant lesions    LABORATORY DATA:  I have reviewed the data as listed    Component Value Date/Time   NA 139 04/15/2017 1329   NA 138 06/02/2014 0910   K 4.0 04/15/2017 1329   K 3.7 06/02/2014 0910   CL 102 04/15/2017 1329   CL  107 06/02/2014 0910   CO2 27 04/15/2017 1329   CO2 26 06/02/2014 0910   GLUCOSE 123 (H) 04/15/2017 1329   GLUCOSE 106 (H) 06/02/2014 0910   BUN 21 (H) 04/15/2017 1329   BUN 17 06/02/2014 0910   CREATININE 0.97 04/15/2017 1329   CREATININE 1.00 06/02/2014 0910   CALCIUM 8.9 04/15/2017 1329   CALCIUM 8.6 (L) 06/02/2014 0910   PROT 7.2 04/15/2017 1329   PROT 6.3 (L) 06/02/2014 0910   ALBUMIN 3.5 04/15/2017 1329   ALBUMIN 3.4 (L) 06/02/2014 0910   AST 27 04/15/2017 1329   AST 23 06/02/2014 0910   ALT 15 04/15/2017 1329   ALT 10 (L) 06/02/2014 0910   ALKPHOS 59 04/15/2017 1329   ALKPHOS 46 06/02/2014 0910   BILITOT 0.3 04/15/2017 1329   BILITOT 0.5 06/02/2014 0910   GFRNONAA 51 (L) 04/15/2017 1329   GFRNONAA 52 (L) 06/02/2014 0910   GFRAA 60 (L) 04/15/2017 1329   GFRAA >60 06/02/2014 0910    No results found for: SPEP, UPEP  Lab Results  Component Value Date   WBC 4.7 04/15/2017   NEUTROABS 2.3 04/15/2017   HGB 11.5 (L) 04/15/2017   HCT 34.8 (L) 04/15/2017   MCV 89.7 04/15/2017   PLT 379 04/15/2017      Chemistry      Component Value Date/Time   NA 139 04/15/2017 1329   NA 138 06/02/2014 0910   K 4.0 04/15/2017 1329   K 3.7 06/02/2014 0910   CL 102 04/15/2017 1329   CL 107 06/02/2014 0910   CO2 27 04/15/2017 1329   CO2 26 06/02/2014 0910   BUN 21 (H) 04/15/2017 1329   BUN 17 06/02/2014 0910   CREATININE 0.97 04/15/2017 1329   CREATININE 1.00 06/02/2014 0910      Component Value Date/Time   CALCIUM 8.9 04/15/2017 1329   CALCIUM 8.6 (L) 06/02/2014 0910   ALKPHOS 59 04/15/2017 1329   ALKPHOS 46 06/02/2014 0910   AST 27 04/15/2017 1329   AST 23 06/02/2014 0910   ALT 15 04/15/2017 1329   ALT 10 (L) 06/02/2014 0910   BILITOT 0.3 04/15/2017 1329   BILITOT 0.5 06/02/2014 0910       RADIOGRAPHIC STUDIES: I have personally reviewed the radiological images as listed and agreed with the findings in  the report. No results found.   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  #Clinical concerns of progression given worsening leg swelling/abdominal distention discomfort; patient reluctantly agrees to have a CT scan.  #Long discussion the patient and daughter regarding-treatment options/need for treatment would be based upon results of the CT scan/and of course patient's preference.  Patient reluctant to start treatments.  She understands treatment would include-chemotherapy options being Abraxane/Doxil/gemcitabine etc.  # Acute R LLE PE- on eliquis; also await CT scan.  # TIA [oct]- 2018; contraindicated for the use of Avastin.  #Follow-up in approximately 2 weeks; CT scan prior./Labs.   Orders Placed This Encounter  Procedures  . CT Abdomen Pelvis W Contrast    Standing Status:   Future    Standing Expiration Date:   06/10/2018    Order Specific Question:   If indicated for the ordered procedure, I authorize the administration of contrast media per Radiology protocol    Answer:   Yes    Order Specific Question:   Preferred imaging location?    Answer:   [facility]    Order Specific Question:   Is Oral Contrast requested for this exam?    Answer:   Yes, Per Radiology protocol    Order Specific Question:   Radiology Contrast Protocol - do NOT remove file path    Answer:   [email or web address]  . CT CHEST W CONTRAST    Standing Status:   Future    Standing Expiration Date:   06/11/2018    Order Specific Question:   If indicated for the ordered procedure, I authorize the administration of contrast media per Radiology protocol    Answer:   Yes    Order Specific Question:   Preferred imaging location?    Answer:   [facility]    Order Specific Question:   Radiology Contrast Protocol - do NOT remove file path    Answer:    \\[email or web address]  . CBC with Differential/Platelet    Standing Status:   Future    Standing Expiration Date:   06/11/2018  . Comprehensive metabolic panel    Standing Status:   Future    Standing Expiration Date:   06/11/2018  . Cancer antigen 27.29    Standing Status:   Future    Standing Expiration Date:   06/11/2018   All questions were answered. The patient knows to call the clinic with any problems, questions or concerns.      [name], MD 06/10/2017 4:12 PM

## 2017-06-10 NOTE — Assessment & Plan Note (Addendum)
#  Metastatic fallopian tube/platinum sensitive high-grade serous-cancer most recently on Avastin/discontinued because of recent stroke/PE.  #Clinical concerns of progression given worsening leg swelling/abdominal distention discomfort; patient reluctantly agrees to have a CT scan.  #Long discussion the patient and daughter regarding-treatment options/need for treatment would be based upon results of the CT scan/and of course patient's preference.  Patient reluctant to start treatments.  She understands treatment would include-chemotherapy options being Abraxane/Doxil/gemcitabine etc.  # Acute R LLE PE- on eliquis; also await CT scan.  # TIA [oct]- 2018; contraindicated for the use of Avastin.  #Follow-up in approximately 2 weeks; CT scan prior./Labs.

## 2017-06-10 NOTE — Progress Notes (Signed)
Here for follow up.  Overall doing better per daughter

## 2017-06-11 LAB — CA 125: CANCER ANTIGEN (CA) 125: 42.7 U/mL — AB (ref 0.0–38.1)

## 2017-06-17 ENCOUNTER — Ambulatory Visit: Admission: RE | Admit: 2017-06-17 | Payer: Medicare HMO | Source: Ambulatory Visit

## 2017-06-19 ENCOUNTER — Other Ambulatory Visit: Payer: Self-pay | Admitting: *Deleted

## 2017-06-19 DIAGNOSIS — Z01812 Encounter for preprocedural laboratory examination: Secondary | ICD-10-CM

## 2017-06-20 ENCOUNTER — Ambulatory Visit
Admission: RE | Admit: 2017-06-20 | Discharge: 2017-06-20 | Disposition: A | Discharge status: Home / Self Care | Payer: Medicare HMO | Source: Ambulatory Visit | Attending: Internal Medicine | Admitting: Internal Medicine

## 2017-06-20 ENCOUNTER — Other Ambulatory Visit
Admission: RE | Admit: 2017-06-20 | Discharge: 2017-06-20 | Disposition: A | Discharge status: Home / Self Care | Payer: Medicare HMO | Source: Ambulatory Visit | Attending: Internal Medicine | Admitting: Internal Medicine

## 2017-06-20 DIAGNOSIS — R590 Localized enlarged lymph nodes: Secondary | ICD-10-CM | POA: Diagnosis not present

## 2017-06-20 DIAGNOSIS — K449 Diaphragmatic hernia without obstruction or gangrene: Secondary | ICD-10-CM | POA: Diagnosis not present

## 2017-06-20 DIAGNOSIS — C774 Secondary and unspecified malignant neoplasm of inguinal and lower limb lymph nodes: Secondary | ICD-10-CM | POA: Diagnosis not present

## 2017-06-20 DIAGNOSIS — C5701 Malignant neoplasm of right fallopian tube: Secondary | ICD-10-CM | POA: Diagnosis present

## 2017-06-20 DIAGNOSIS — Z01812 Encounter for preprocedural laboratory examination: Secondary | ICD-10-CM

## 2017-06-20 DIAGNOSIS — C5700 Malignant neoplasm of unspecified fallopian tube: Secondary | ICD-10-CM | POA: Diagnosis not present

## 2017-06-20 LAB — CREATININE, SERUM
Creatinine, Ser: 0.95 mg/dL (ref 0.44–1.00)
GFR calc Af Amer: >60 mL/min (ref >60)
GFR calc non Af Amer: 52 mL/min — ABNORMAL LOW (ref >60)

## 2017-06-20 MED ORDER — IOPAMIDOL (ISOVUE-300) INJECTION 61%
100.0000 mL | Freq: Once | INTRAVENOUS | Status: AC | PRN
  Administered 2017-06-20: 100 mL via INTRAVENOUS

## 2017-06-23 ENCOUNTER — Inpatient Hospital Stay (HOSPITAL_BASED_OUTPATIENT_CLINIC_OR_DEPARTMENT_OTHER): Payer: Medicare HMO | Admitting: Internal Medicine

## 2017-06-23 ENCOUNTER — Encounter: Payer: Self-pay | Admitting: Internal Medicine

## 2017-06-23 ENCOUNTER — Other Ambulatory Visit: Payer: Self-pay

## 2017-06-23 ENCOUNTER — Inpatient Hospital Stay: Payer: Medicare HMO

## 2017-06-23 VITALS — BP 118/75 | HR 67 | Temp 97.6°F | Resp 18 | Ht 60.0 in | Wt 137.0 lb

## 2017-06-23 DIAGNOSIS — R5383 Other fatigue: Secondary | ICD-10-CM

## 2017-06-23 DIAGNOSIS — Z8673 Personal history of transient ischemic attack (TIA), and cerebral infarction without residual deficits: Secondary | ICD-10-CM | POA: Diagnosis not present

## 2017-06-23 DIAGNOSIS — M79661 Pain in right lower leg: Secondary | ICD-10-CM

## 2017-06-23 DIAGNOSIS — Z90710 Acquired absence of both cervix and uterus: Secondary | ICD-10-CM | POA: Diagnosis not present

## 2017-06-23 DIAGNOSIS — R978 Other abnormal tumor markers: Secondary | ICD-10-CM | POA: Diagnosis not present

## 2017-06-23 DIAGNOSIS — C774 Secondary and unspecified malignant neoplasm of inguinal and lower limb lymph nodes: Secondary | ICD-10-CM

## 2017-06-23 DIAGNOSIS — G893 Neoplasm related pain (acute) (chronic): Secondary | ICD-10-CM | POA: Diagnosis not present

## 2017-06-23 DIAGNOSIS — Z923 Personal history of irradiation: Secondary | ICD-10-CM

## 2017-06-23 DIAGNOSIS — C5701 Malignant neoplasm of right fallopian tube: Secondary | ICD-10-CM

## 2017-06-23 DIAGNOSIS — I2699 Other pulmonary embolism without acute cor pulmonale: Secondary | ICD-10-CM | POA: Diagnosis not present

## 2017-06-23 DIAGNOSIS — R140 Abdominal distension (gaseous): Secondary | ICD-10-CM | POA: Diagnosis not present

## 2017-06-23 DIAGNOSIS — M7989 Other specified soft tissue disorders: Secondary | ICD-10-CM | POA: Diagnosis not present

## 2017-06-23 DIAGNOSIS — Z86718 Personal history of other venous thrombosis and embolism: Secondary | ICD-10-CM

## 2017-06-23 DIAGNOSIS — R5381 Other malaise: Secondary | ICD-10-CM

## 2017-06-23 DIAGNOSIS — R109 Unspecified abdominal pain: Secondary | ICD-10-CM | POA: Diagnosis not present

## 2017-06-23 DIAGNOSIS — Z7901 Long term (current) use of anticoagulants: Secondary | ICD-10-CM | POA: Diagnosis not present

## 2017-06-23 LAB — CBC WITH DIFFERENTIAL/PLATELET
Basophils Absolute: 0.1 10*3/uL (ref 0–0.1)
Basophils Relative: 2 %
EOS PCT: 2 %
Eosinophils Absolute: 0.1 10*3/uL (ref 0–0.7)
HEMATOCRIT: 34.3 % — AB (ref 35.0–47.0)
Hemoglobin: 11.5 g/dL — ABNORMAL LOW (ref 12.0–16.0)
LYMPHS ABS: 1.3 10*3/uL (ref 1.0–3.6)
Lymphocytes Relative: 29 %
MCH: 30.8 pg (ref 26.0–34.0)
MCHC: 33.5 g/dL (ref 32.0–36.0)
MCV: 91.9 fL (ref 80.0–100.0)
MONO ABS: 0.6 10*3/uL (ref 0.2–0.9)
MONOS PCT: 14 %
NEUTROS ABS: 2.4 10*3/uL (ref 1.4–6.5)
Neutrophils Relative %: 53 %
PLATELETS: 394 10*3/uL (ref 150–440)
RBC: 3.73 MIL/uL — ABNORMAL LOW (ref 3.80–5.20)
RDW: 15.1 % — AB (ref 11.5–14.5)
WBC: 4.4 10*3/uL (ref 3.6–11.0)

## 2017-06-23 LAB — COMPREHENSIVE METABOLIC PANEL WITH GFR
ALT: 12 U/L — ABNORMAL LOW (ref 14–54)
AST: 23 U/L (ref 15–41)
Albumin: 3.7 g/dL (ref 3.5–5.0)
Alkaline Phosphatase: 61 U/L (ref 38–126)
Anion gap: 10 (ref 5–15)
BUN: 20 mg/dL (ref 6–20)
CO2: 26 mmol/L (ref 22–32)
Calcium: 9.2 mg/dL (ref 8.9–10.3)
Chloride: 101 mmol/L (ref 101–111)
Creatinine, Ser: 0.95 mg/dL (ref 0.44–1.00)
GFR calc Af Amer: >60 mL/min
GFR calc non Af Amer: 52 mL/min — ABNORMAL LOW
Glucose, Bld: 80 mg/dL (ref 65–99)
Potassium: 4 mmol/L (ref 3.5–5.1)
Sodium: 137 mmol/L (ref 135–145)
Total Bilirubin: 0.8 mg/dL (ref 0.3–1.2)
Total Protein: 7.2 g/dL (ref 6.5–8.1)

## 2017-06-23 NOTE — Assessment & Plan Note (Addendum)
#   Metastatic fallopian tube/platinum sensitive high-grade serous-cancer; worsened; May 2019 CT scan shows progressive right necrotic inguinal lymph node which is symptomatic/causing right lower extremity swelling; no obvious evidence of disease in the chest of the abdomen.  #Right lower extremity swelling/secondary to right necrotic inguinal lymph node-given patient's poor tolerance to systemic therapy; recommend radiation.  Discussed with Dr. Donella Stade.  #Acute right lower lung PE on Eliquis; stable  #TIA-October 2018 contraindicated further Avastin.  # send out [facility] testing.   # rad-Onc ASAP [family pref- 5/23]; June 25th in [place]/labs/labs.   # I reviewed the blood work- with the patient in detail; also reviewed the imaging independently [as summarized above]; and with the patient in detail.

## 2017-06-23 NOTE — Progress Notes (Signed)
[facility] OFFICE PROGRESS NOTE  Patient Care Team: Ezequiel Kayser, [name]  Cancer Staging No matching staging information was found for the patient.   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  Adequate TRS, no macroscopic residual. IP/IV chemotherapy with DDP and paclitaxel on GOG protocol, chemo and Bev consolidation completed in 03/2011 2. 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 November 14, 2011- Patient was started on carboplatin and gemcitabine 4. Finished 6 cycles of chemotherapy in April of 2014 with carboplatin and gemcitabine. Tolerance was fairly good except for neutropenia and thrombocytopenia. 5.recurrent disease by CT scan February of 2015  # .radiation therapy to pelvis  May of 2015  7.upper extremity  . and deep vein thrombosis associated with port.(June of 2015) patient started on   [place] had port removed and had   thrombectomy September 06, 2013. # CT DEC 2016- progressing disease in the right inguinal area # FEB-TAXOL-AVASTIN;  # FEB 2017- AVASTIN q 3W; July 2017- CT- Improved  Right Inguinal LN; STOP AVASTIN sec to potential concerns of AEs  # AUG 2017 3rd- START ZEJULA- declines sec to intol  # OCT 7th CT- STABLE RIGHT INGUINAL LN- RE-START AVASTIN q 3W [STOPPED oct 25th 2018]; Avastin discontinued October 2018 [stroke/PE]  MOLECULAR TESTING: somatic BRCA negative.   Dx: SEP 2013  fallopian tube/platinum sensitive high-grade serous-cancer  Current/most recent therapy- Avastin discon [oct 2018] Goals: palliative; stage: Metastatic/Recurrent - IV         Malignant neoplasm of right fallopian tube [facility])      INTERVAL HISTORY:  Nicole Clayton 84 y.o.  female pleasant patient above history of fallopian tube cancer is here for follow-up/reviewed the results of the CAT scan  Patient continues to  complain of right lower leg swelling; leg feels tight; and is quite painful.  Denies any skin rash.  Denies any signs of infection.  She continues to wear TED hose.  Review of Systems  Constitutional: Positive for malaise/fatigue. Negative for chills, diaphoresis, fever and weight loss.  HENT: Negative for nosebleeds and sore throat.   Eyes: Negative for double vision.  Respiratory: Negative for cough, hemoptysis, sputum production, shortness of breath and wheezing.   Cardiovascular: Positive for leg swelling. Negative for chest pain, palpitations and orthopnea.  Gastrointestinal: Negative for abdominal pain, blood in stool, constipation, diarrhea, heartburn, melena, nausea and vomiting.  Genitourinary: Negative for dysuria, frequency and urgency.  Musculoskeletal: Negative for back pain and joint pain.  Skin: Negative.  Negative for itching and rash.  Neurological: Negative for dizziness, tingling, focal weakness, weakness and headaches.  Endo/Heme/Allergies: Does not bruise/bleed easily.  Psychiatric/Behavioral: Negative for depression. The patient is not nervous/anxious and does not have insomnia.       PAST MEDICAL HISTORY :  Past Medical History:  Diagnosis Date  . Bladder infection, chronic 10/19/2011  . Cancer (HCC)    Ovarian   . Carcinoma of fallopian tube ([place]) 10/05/2009   Overview:  Overview:  Overview:  Drs. Claiborne Rigg and Delorise Shiner Choksi Overview:  Drs. Claiborne Rigg and [employer]   . Concussion   . GERD (gastroesophageal reflux disease)   . MI (mitral incompetence) 10/27/2014   Overview:  MODERATE   . Mitral valve prolapse   . OAB (overactive bladder)   . Pulmonary embolism ([place])   . Tachycardia     PAST SURGICAL HISTORY :  Past Surgical History:  Procedure Laterality Date  . ABDOMINAL HYSTERECTOMY    . CHOLECYSTECTOMY    . LAPAROSCOPIC BILATERAL SALPINGO OOPHERECTOMY  2011  . ovarian cancer surgery  2011    FAMILY HISTORY :   Family History  Problem  Relation Age of Onset  . Ovarian cancer Other   . Breast cancer Neg Hx   . Colon cancer Neg Hx   . Diabetes Neg Hx   . Heart disease Neg Hx   . Kidney disease Neg Hx   . Bladder Cancer Neg Hx     SOCIAL HISTORY:   Social History   Tobacco Use  . Smoking status: Never Smoker  . Smokeless tobacco: Never Used  Substance Use Topics  . Alcohol use: No  . Drug use: No    ALLERGIES:  is allergic to benadryl [diphenhydramine]; tamsulosin; alendronate sodium; duloxetine; duloxetine hcl; risedronate sodium; lovastatin; and sulfa antibiotics.  MEDICATIONS:  Current Outpatient Medications  Medication Sig Dispense Refill  . acetaminophen (TYLENOL) 650 MG CR tablet Take 650 mg by mouth every 8 (eight) hours as needed for pain.    Marland Kitchen apixaban (ELIQUIS) 5 MG TABS tablet Take 1 tablet (5 mg total) by mouth 2 (two) times daily. 60 tablet 5  . Ascorbic Acid (VITAMIN C) 100 MG tablet Take 100 mg by mouth daily.    Marland Kitchen aspirin EC 81 MG tablet Take 81 mg by mouth.    . cephALEXin (KEFLEX) 250 MG capsule Take 250 mg by mouth.     . cetirizine (ZYRTEC) 10 MG tablet Take 10 mg by mouth daily.     . cholecalciferol (VITAMIN D) 1000 units tablet Take 1,000 Units by mouth daily.     Marland Kitchen docusate sodium (COLACE) 100 MG capsule Take 100 mg by mouth daily as needed for mild constipation.     . fluticasone (FLONASE) 50 MCG/ACT nasal spray Place 1 spray into both nostrils daily.     Marland Kitchen gabapentin (NEURONTIN) 600 MG tablet TAKE (1) TABLET BY MOUTH THREE TIMES A DAY (Patient taking differently: Take 600 mg by mouth 2 (two) times daily as needed. TAKE (1) TABLET BY MOUTH THREE TIMES A DAY) 180 tablet 0  . metoprolol succinate (TOPROL-XL) 50 MG 24 hr tablet     . naproxen sodium (ANAPROX) 220 MG tablet Take 220 mg by mouth 2 (two) times daily with a meal.    . nystatin ointment (MYCOSTATIN) Apply 1 application topically 2 (two) times daily. 30 g 1  . ondansetron (ZOFRAN) 4 MG tablet Take 4 mg by mouth every 8 (eight)  hours as needed for nausea or vomiting.    . pantoprazole (PROTONIX) 40 MG tablet Take 40 mg by mouth daily.     Marland Kitchen triamcinolone ointment (KENALOG) 0.1 % Apply 1 application topically 2 (two) times daily. (Patient not taking: Reported on 06/10/2017) 30 g 1   No current facility-administered medications for this visit.    Facility-Administered Medications Ordered in Other Visits  Medication Dose Route Frequency Provider Last Rate Last Dose  . diphenhydrAMINE (BENADRYL) injection 50 mg  50 mg Intravenous Once Choksi, Janak, MD      . sodium chloride 0.9 % injection 10 mL  10 mL Intravenous PRN Forest Gleason, MD   10 mL at 01/12/15 1116  . sodium chloride 0.9 % injection 10 mL  10 mL Intravenous PRN Forest Gleason, MD   10 mL at 02/16/15 1025    PHYSICAL EXAMINATION: ECOG PERFORMANCE STATUS: 2 - Symptomatic, <50% confined  to bed  BP 118/75   Pulse 67   Temp 97.6 F (36.4 C) (Tympanic)   Resp 18   Ht 5' (1.524 m)   Wt 137 lb (62.1 kg)   BMI 26.76 kg/m   Filed Weights   06/23/17 1031  Weight: 137 lb (62.1 kg)    GENERAL: Well-nourished well-developed; Alert, no distress and comfortable.  Accompanied by family.  Walks with a walker. EYES: no pallor or icterus OROPHARYNX: no thrush or ulceration; NECK: supple; no lymph nodes felt. LYMPH:  no palpable lymphadenopathy in the axillary; positive for inguinal adenopathy in the right side  lUNGS: Decreased breath sounds auscultation bilaterally. No wheeze or crackles HEART/CVS: regular rate & rhythm and no murmurs; bilateral lower extremity swelling; right more than left. ABDOMEN:abdomen soft, non-tender and normal bowel sounds. No hepatomegaly or splenomegaly.  Musculoskeletal:no cyanosis of digits and no clubbing  PSYCH: alert & oriented x 3 with fluent speech NEURO: no focal motor/sensory deficits SKIN:  no rashes or significant lesions    LABORATORY DATA:  I have reviewed the data as listed    Component Value Date/Time   NA 137  06/23/2017 1006   NA 138 06/02/2014 0910   K 4.0 06/23/2017 1006   K 3.7 06/02/2014 0910   CL 101 06/23/2017 1006   CL 107 06/02/2014 0910   CO2 26 06/23/2017 1006   CO2 26 06/02/2014 0910   GLUCOSE 80 06/23/2017 1006   GLUCOSE 106 (H) 06/02/2014 0910   BUN 20 06/23/2017 1006   BUN 17 06/02/2014 0910   CREATININE 0.95 06/23/2017 1006   CREATININE 1.00 06/02/2014 0910   CALCIUM 9.2 06/23/2017 1006   CALCIUM 8.6 (L) 06/02/2014 0910   PROT 7.2 06/23/2017 1006   PROT 6.3 (L) 06/02/2014 0910   ALBUMIN 3.7 06/23/2017 1006   ALBUMIN 3.4 (L) 06/02/2014 0910   AST 23 06/23/2017 1006   AST 23 06/02/2014 0910   ALT 12 (L) 06/23/2017 1006   ALT 10 (L) 06/02/2014 0910   ALKPHOS 61 06/23/2017 1006   ALKPHOS 46 06/02/2014 0910   BILITOT 0.8 06/23/2017 1006   BILITOT 0.5 06/02/2014 0910   GFRNONAA 52 (L) 06/23/2017 1006   GFRNONAA 52 (L) 06/02/2014 0910   GFRAA >60 06/23/2017 1006   GFRAA >60 06/02/2014 0910    No results found for: SPEP, UPEP  Lab Results  Component Value Date   WBC 4.4 06/23/2017   NEUTROABS 2.4 06/23/2017   HGB 11.5 (L) 06/23/2017   HCT 34.3 (L) 06/23/2017   MCV 91.9 06/23/2017   PLT 394 06/23/2017      Chemistry      Component Value Date/Time   NA 137 06/23/2017 1006   NA 138 06/02/2014 0910   K 4.0 06/23/2017 1006   K 3.7 06/02/2014 0910   CL 101 06/23/2017 1006   CL 107 06/02/2014 0910   CO2 26 06/23/2017 1006   CO2 26 06/02/2014 0910   BUN 20 06/23/2017 1006   BUN 17 06/02/2014 0910   CREATININE 0.95 06/23/2017 1006   CREATININE 1.00 06/02/2014 0910      Component Value Date/Time   CALCIUM 9.2 06/23/2017 1006   CALCIUM 8.6 (L) 06/02/2014 0910   ALKPHOS 61 06/23/2017 1006   ALKPHOS 46 06/02/2014 0910   AST 23 06/23/2017 1006   AST 23 06/02/2014 0910   ALT 12 (L) 06/23/2017 1006   ALT 10 (L) 06/02/2014 0910   BILITOT 0.8 06/23/2017 1006   BILITOT 0.5 06/02/2014 0910  RADIOGRAPHIC STUDIES: I have personally reviewed the  radiological images as listed and agreed with the findings in the report. No results found.   ASSESSMENT & PLAN:  Malignant neoplasm of right fallopian tube [facility]) # Metastatic fallopian tube/platinum sensitive high-grade serous-cancer; worsened; May 2019 CT scan shows progressive right necrotic inguinal lymph node which is symptomatic/causing right lower extremity swelling; no obvious evidence of disease in the chest of the abdomen.  #Right lower extremity swelling/secondary to right necrotic inguinal lymph node-given patient's poor tolerance to systemic therapy; recommend radiation.  Discussed with Dr. Donella Stade.  #Acute right lower lung PE on Eliquis; stable  #TIA-October 2018 contraindicated further Avastin.  # send out [place] [facility] testing.   # rad-Onc ASAP [family pref- 5/23]; June 25th in [place]/labs/labs.   # I reviewed the blood work- with the patient in detail; also reviewed the imaging independently [as summarized above]; and with the patient in detail.     No orders of the defined types were placed in this encounter.  All questions were answered. The patient knows to call the clinic with any problems, questions or concerns.      [name], MD 06/24/2017 8:14 AM

## 2017-06-24 ENCOUNTER — Telehealth: Payer: Self-pay | Admitting: Internal Medicine

## 2017-06-24 LAB — CANCER ANTIGEN 27.29: CA 27.29: 63.9 U/mL — ABNORMAL HIGH (ref 0.0–38.6)

## 2017-06-24 NOTE — Telephone Encounter (Signed)
Please order [facility] on pathology- dated 10/24/2011 [Pathology  Order [id number]]. Thx

## 2017-06-25 NOTE — Telephone Encounter (Signed)
MD order for [facility] faxed to [facility].

## 2017-06-26 ENCOUNTER — Encounter: Payer: Self-pay | Admitting: Radiation Oncology

## 2017-06-26 ENCOUNTER — Ambulatory Visit
Admission: RE | Admit: 2017-06-26 | Discharge: 2017-06-26 | Disposition: A | Discharge status: Home / Self Care | Payer: Medicare HMO | Source: Ambulatory Visit | Attending: Radiation Oncology | Admitting: Radiation Oncology

## 2017-06-26 ENCOUNTER — Other Ambulatory Visit: Payer: Self-pay

## 2017-06-26 VITALS — BP 121/67 | HR 67 | Temp 96.7°F | Resp 18 | Wt 139.6 lb

## 2017-06-26 DIAGNOSIS — Z7982 Long term (current) use of aspirin: Secondary | ICD-10-CM | POA: Insufficient documentation

## 2017-06-26 DIAGNOSIS — K219 Gastro-esophageal reflux disease without esophagitis: Secondary | ICD-10-CM | POA: Diagnosis not present

## 2017-06-26 DIAGNOSIS — Z86711 Personal history of pulmonary embolism: Secondary | ICD-10-CM | POA: Diagnosis not present

## 2017-06-26 DIAGNOSIS — C5701 Malignant neoplasm of right fallopian tube: Secondary | ICD-10-CM

## 2017-06-26 DIAGNOSIS — Z8041 Family history of malignant neoplasm of ovary: Secondary | ICD-10-CM | POA: Diagnosis not present

## 2017-06-26 DIAGNOSIS — Z923 Personal history of irradiation: Secondary | ICD-10-CM | POA: Insufficient documentation

## 2017-06-26 DIAGNOSIS — Z8544 Personal history of malignant neoplasm of other female genital organs: Secondary | ICD-10-CM | POA: Insufficient documentation

## 2017-06-26 DIAGNOSIS — Z87440 Personal history of urinary (tract) infections: Secondary | ICD-10-CM | POA: Diagnosis not present

## 2017-06-26 DIAGNOSIS — Z79899 Other long term (current) drug therapy: Secondary | ICD-10-CM | POA: Insufficient documentation

## 2017-06-26 DIAGNOSIS — C774 Secondary and unspecified malignant neoplasm of inguinal and lower limb lymph nodes: Secondary | ICD-10-CM | POA: Insufficient documentation

## 2017-06-26 DIAGNOSIS — N3281 Overactive bladder: Secondary | ICD-10-CM | POA: Insufficient documentation

## 2017-06-26 DIAGNOSIS — I341 Nonrheumatic mitral (valve) prolapse: Secondary | ICD-10-CM | POA: Diagnosis not present

## 2017-06-26 DIAGNOSIS — Z9221 Personal history of antineoplastic chemotherapy: Secondary | ICD-10-CM | POA: Insufficient documentation

## 2017-06-26 DIAGNOSIS — I890 Lymphedema, not elsewhere classified: Secondary | ICD-10-CM | POA: Diagnosis not present

## 2017-06-26 NOTE — Consult Note (Signed)
NEW PATIENT EVALUATION  Name: Nicole Clayton  MRN: [id number]  Date:   06/26/2017     DOB: 02/13/1928   This 84 y.o. female patient presents to the clinic for initial evaluation of reevaluation for right inguinal involvement of adenocarcinoma the fallopian tubes stage IIIc previously treated 5 years prior with palliative radiation therapy.  REFERRING PHYSICIAN: Ezequiel Kayser, MD  CHIEF COMPLAINT:  Chief Complaint  Patient presents with  . Cancer    Pt is here for consult for inguinal lymph node    DIAGNOSIS: The encounter diagnosis was Malignant neoplasm of right fallopian tube ([place]).   PREVIOUS INVESTIGATIONS:  CT scans reviewed Pathology reports reviewed Clinical notes reviewed  HPI: patient is an 84 year old female well-known to department having been treated back 5 years prior for stage IIIc adenocarcinoma the fallopian tube. She had receivedboth IV and intraperitoneal chemotherapy. She had positive right inguinal recurrence in 2013 started on carboplatinum gemcitabine. She will radiation therapy to her pelvis and 2015 for locally advanced disease which has done well with no evidence of disease in that area on recent CT scan.she has been on ZEJULAas well as a Avastin. Recent CT scan shows marked progression of disease in her right inguinal region. She also has significant right lower extremity lymphedema. I been asked to evaluate her by medical oncology for palliation to this region. She's having no increased lower urinary tract symptoms no diarrhea at this time.  PLANNED TREATMENT REGIMEN: palliative treatment to right inguinal region  PAST MEDICAL HISTORY:  has a past medical history of Bladder infection, chronic (10/19/2011), Cancer ([place]), Carcinoma of fallopian tube ([place]) (10/05/2009), Concussion, GERD (gastroesophageal reflux disease), MI (mitral incompetence) (10/27/2014), Mitral valve prolapse, OAB (overactive bladder), Pulmonary embolism ([place]), and Tachycardia.    PAST SURGICAL  HISTORY:  Past Surgical History:  Procedure Laterality Date  . ABDOMINAL HYSTERECTOMY    . CHOLECYSTECTOMY    . LAPAROSCOPIC BILATERAL SALPINGO OOPHERECTOMY  2011  . ovarian cancer surgery  2011    FAMILY HISTORY: family history includes Ovarian cancer in her other.  SOCIAL HISTORY:  reports that she has never smoked. She has never used smokeless tobacco. She reports that she does not drink alcohol or use drugs.  ALLERGIES: Benadryl [diphenhydramine]; Tamsulosin; Alendronate sodium; Duloxetine; Duloxetine hcl; Risedronate sodium; Lovastatin; and Sulfa antibiotics  MEDICATIONS:  Current Outpatient Medications  Medication Sig Dispense Refill  . acetaminophen (TYLENOL) 650 MG CR tablet Take 650 mg by mouth every 8 (eight) hours as needed for pain.    Marland Kitchen apixaban (ELIQUIS) 5 MG TABS tablet Take 1 tablet (5 mg total) by mouth 2 (two) times daily. 60 tablet 5  . Ascorbic Acid (VITAMIN C) 100 MG tablet Take 100 mg by mouth daily.    Marland Kitchen aspirin EC 81 MG tablet Take 81 mg by mouth.    . cephALEXin (KEFLEX) 250 MG capsule Take 250 mg by mouth.     . cetirizine (ZYRTEC) 10 MG tablet Take 10 mg by mouth daily.     . cholecalciferol (VITAMIN D) 1000 units tablet Take 1,000 Units by mouth daily.     Marland Kitchen docusate sodium (COLACE) 100 MG capsule Take 100 mg by mouth daily as needed for mild constipation.     . fluticasone (FLONASE) 50 MCG/ACT nasal spray Place 1 spray into both nostrils daily.     Marland Kitchen gabapentin (NEURONTIN) 600 MG tablet TAKE (1) TABLET BY MOUTH THREE TIMES A DAY (Patient taking differently: Take 600 mg by mouth 2 (two) times daily  as needed. TAKE (1) TABLET BY MOUTH THREE TIMES A DAY) 180 tablet 0  . metoprolol succinate (TOPROL-XL) 50 MG 24 hr tablet     . naproxen sodium (ANAPROX) 220 MG tablet Take 220 mg by mouth 2 (two) times daily with a meal.    . nystatin ointment (MYCOSTATIN) Apply 1 application topically 2 (two) times daily. 30 g 1  . ondansetron (ZOFRAN) 4 MG tablet Take 4 mg by  mouth every 8 (eight) hours as needed for nausea or vomiting.    . pantoprazole (PROTONIX) 40 MG tablet Take 40 mg by mouth daily.     Marland Kitchen triamcinolone ointment (KENALOG) 0.1 % Apply 1 application topically 2 (two) times daily. (Patient not taking: Reported on 06/26/2017) 30 g 1   No current facility-administered medications for this encounter.    Facility-Administered Medications Ordered in Other Encounters  Medication Dose Route Frequency Provider Last Rate Last Dose  . diphenhydrAMINE (BENADRYL) injection 50 mg  50 mg Intravenous Once Choksi, Janak, MD      . sodium chloride 0.9 % injection 10 mL  10 mL Intravenous PRN Forest Gleason, MD   10 mL at 01/12/15 1116  . sodium chloride 0.9 % injection 10 mL  10 mL Intravenous PRN Choksi, Delorise Shiner, MD   10 mL at 02/16/15 1025    ECOG PERFORMANCE STATUS:  1 - Symptomatic but completely ambulatory  REVIEW OF SYSTEMS: xcept for the lymphedema in her right lower extremity Patient denies any weight loss, fatigue, weakness, fever, chills or night sweats. Patient denies any loss of vision, blurred vision. Patient denies any ringing  of the ears or hearing loss. No irregular heartbeat. Patient denies heart murmur or history of fainting. Patient denies any chest pain or pain radiating to her upper extremities. Patient denies any shortness of breath, difficulty breathing at night, cough or hemoptysis. Patient denies any swelling in the lower legs. Patient denies any nausea vomiting, vomiting of blood, or coffee ground material in the vomitus. Patient denies any stomach pain. Patient states has had normal bowel movements no significant constipation or diarrhea. Patient denies any dysuria, hematuria or significant nocturia. Patient denies any problems walking, swelling in the joints or loss of balance. Patient denies any skin changes, loss of hair or loss of weight. Patient denies any excessive worrying or anxiety or significant depression. Patient denies any problems  with insomnia. Patient denies excessive thirst, polyuria, polydipsia. Patient denies any swollen glands, patient denies easy bruising or easy bleeding. Patient denies any recent infections, allergies or URI. Patient "s visual fields have not changed significantly in recent time.    PHYSICAL EXAM: BP 121/67   Pulse 67   Temp (!) 96.7 F (35.9 C)   Resp 18   Wt 139 lb 8.8 oz (63.3 kg)   BMI 27.25 kg/m  She has significant pitting edema in her right lower extremity. She has palpable adenopathy in the right inguinal region. Well-developed well-nourished patient in NAD. HEENT reveals PERLA, EOMI, discs not visualized.  Oral cavity is clear. No oral mucosal lesions are identified. Neck is clear without evidence of cervical or supraclavicular adenopathy. Lungs are clear to A&P. Cardiac examination is essentially unremarkable with regular rate and rhythm without murmur rub or thrill. Abdomen is benign with no organomegaly or masses noted. Motor sensory and DTR levels are equal and symmetric in the upper and lower extremities. Cranial nerves II through XII are grossly intact. Proprioception is intact. No peripheral adenopathy or edema is identified. No motor or  sensory levels are noted. Crude visual fields are within normal range.  LABORATORY DATA: previous pathology reports reviewed    RADIOLOGY RESULTS:CT scans of abdomen chest and pelvis reviewed   IMPRESSION: rogression of her adenocarcinoma fallopian tube in the right inguinal region in 84 year old female for palliative treatment.  PLAN: at this time I to go ahead with palliative radiation therapy to her right inguinal node. May use IM RT treatment planning and delivery based on avoiding her previous radiated fields. I would plan on delivering 3000 cGy in 10 fractions and evaluate for response. Risks and benefits of treatment including skin reaction fatigue alteration of blood counts all were discussed in detail with the patient. She seems to  comprehend my treatment plan well. I have personally set up and ordered CT simulation right after the [place] Day weekend.  I would like to take this opportunity to thank you for allowing me to participate in the care of your patient.Noreene Filbert, MD

## 2017-07-01 ENCOUNTER — Ambulatory Visit
Admission: RE | Admit: 2017-07-01 | Discharge: 2017-07-01 | Disposition: A | Discharge status: Home / Self Care | Payer: Medicare HMO | Source: Ambulatory Visit | Attending: Radiation Oncology | Admitting: Radiation Oncology

## 2017-07-01 DIAGNOSIS — C5701 Malignant neoplasm of right fallopian tube: Secondary | ICD-10-CM | POA: Diagnosis not present

## 2017-07-01 DIAGNOSIS — C774 Secondary and unspecified malignant neoplasm of inguinal and lower limb lymph nodes: Secondary | ICD-10-CM | POA: Insufficient documentation

## 2017-07-02 ENCOUNTER — Telehealth: Payer: Self-pay | Admitting: *Deleted

## 2017-07-02 ENCOUNTER — Other Ambulatory Visit: Payer: Self-pay

## 2017-07-02 ENCOUNTER — Telehealth: Payer: Self-pay | Admitting: Internal Medicine

## 2017-07-02 ENCOUNTER — Ambulatory Visit
Admission: EM | Admit: 2017-07-02 | Discharge: 2017-07-02 | Disposition: A | Discharge status: Home / Self Care | Payer: Medicare HMO | Attending: Family Medicine | Admitting: Family Medicine

## 2017-07-02 DIAGNOSIS — I890 Lymphedema, not elsewhere classified: Secondary | ICD-10-CM

## 2017-07-02 HISTORY — DX: Malignant neoplasm of unspecified ovary: C56.9

## 2017-07-02 NOTE — ED Provider Notes (Signed)
[facility]    CSN: [id number] Arrival date & time: 07/02/17  1143     History   Chief Complaint Chief Complaint  Patient presents with  . Leg Swelling    HPI Nicole Clayton is a 84 y.o. female.   84 yo female with a h/o gynecologic cancer and lymph node swelling, presents with a c/o clear liquid drops oozing from the skin of her right lower leg. Patient is scheduled to start radiation next week for her lymphadenopathy. Denies any fevers, chills, redness to lower leg, ulcers or pus drainage.  The history is provided by the patient.    Past Medical History:  Diagnosis Date  . Bladder infection, chronic 10/19/2011  . Cancer (HCC)    Ovarian   . 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 Claiborne Rigg and Delorise Shiner Choksi Overview:  Drs. Claiborne Rigg and [employer]   . Concussion   . GERD (gastroesophageal reflux disease)   . MI (mitral incompetence) 10/27/2014   Overview:  MODERATE   . Mitral valve prolapse   . OAB (overactive bladder)   . Ovarian cancer ([place])   . Pulmonary embolism ([place])   . Tachycardia     Patient Active Problem List   Diagnosis Date Noted  . Pain and swelling of right lower leg 10/11/2016  . Anemia 09/11/2015  . UTI (urinary tract infection) due to Enterococcus 07/19/2015  . 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 Neuropathy 02/10/2015  . Midline cystocele 12/15/2014  . Vaginal atrophy 12/15/2014  . Status post hysterectomy with oophorectomy 12/15/2014  . Celiac disease 11/07/2014  . MI (mitral incompetence) 10/27/2014  . Infection of urinary tract 08/12/2014  . CD (celiac disease) 07/12/2014  . Mixed hyperlipidemia 04/12/2014  . Paroxysmal supraventricular tachycardia ([place]) 04/12/2014  . Thrombocythemia ([place]) 09/26/2013  . Vitamin D deficiency 09/26/2013  . Frequent UTI 09/26/2013  . Chest pain 07/28/2013  . Edema 07/06/2013  . Other symptoms involving urinary system 07/07/2012  . Female genuine  stress incontinence 02/10/2012  . Bladder infection, chronic 10/19/2011  . Urge incontinence 10/19/2011  . 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 ABDOMINAL HYSTERECTOMY    . CHOLECYSTECTOMY    . LAPAROSCOPIC BILATERAL SALPINGO OOPHERECTOMY  2011  . ovarian cancer surgery  2011    OB History    Gravida  3   Para  3   Term  3   Preterm      AB      Living  3     SAB      TAB      Ectopic      Multiple      Live Births  3            Home Medications    Prior to Admission medications   Medication Sig Start Date End Date Taking? Authorizing Provider  acetaminophen (TYLENOL) 650 MG CR tablet Take 650 mg by mouth every 8 (eight) hours as needed for pain.    [provider]  apixaban (ELIQUIS) 5 MG TABS tablet Take 1 tablet (5 mg total) by mouth 2 (two) times daily. 01/17/17   [name], MD  Ascorbic Acid (VITAMIN C) 100 MG tablet Take 100 mg by mouth daily.    [provider]  aspirin EC 81 MG tablet Take 81 mg by mouth.    [provider]  cephALEXin (KEFLEX) 250 MG capsule Take 250 mg by mouth.  05/17/17   [provider]  cetirizine (ZYRTEC) 10 MG tablet Take 10 mg by mouth daily.     [provider]  cholecalciferol (VITAMIN D) 1000 units tablet Take 1,000 Units by mouth daily.     [provider]  docusate sodium (COLACE) 100 MG capsule Take 100 mg by mouth daily as needed for mild constipation.     [provider]  fluticasone (FLONASE) 50 MCG/ACT nasal spray Place 1 spray into both nostrils daily.  04/26/14   [provider]  gabapentin (NEURONTIN) 600 MG tablet TAKE (1) TABLET BY MOUTH THREE TIMES A DAY Patient taking differently: Take 600 mg by mouth 2 (two) times daily as needed. TAKE (1) TABLET BY MOUTH THREE TIMES A DAY 07/25/16   Lequita Asal, MD  metoprolol succinate (TOPROL-XL) 50 MG 24  hr tablet  05/17/17   [provider]  naproxen sodium (ANAPROX) 220 MG tablet Take 220 mg by mouth 2 (two) times daily with a meal.    [provider]  nystatin ointment (MYCOSTATIN) Apply 1 application topically 2 (two) times daily. 05/14/17   Defrancesco, Alanda Slim, MD  ondansetron (ZOFRAN) 4 MG tablet Take 4 mg by mouth every 8 (eight) hours as needed for nausea or vomiting.    [provider]  pantoprazole (PROTONIX) 40 MG tablet Take 40 mg by mouth daily.  10/18/11   [provider]  triamcinolone ointment (KENALOG) 0.1 % Apply 1 application topically 2 (two) times daily. Patient not taking: Reported on 06/26/2017 05/14/17   Defrancesco, Alanda Slim, MD    Family History Family History  Problem Relation Age of Onset  . Ovarian cancer Other   . Breast cancer Neg Hx   . Colon cancer Neg Hx   . Diabetes Neg Hx   . Heart disease Neg Hx   . Kidney disease Neg Hx   . Bladder Cancer Neg Hx     Social History Social History   Tobacco Use  . Smoking status: Never Smoker  . Smokeless tobacco: Never Used  Substance Use Topics  . Alcohol use: No  . Drug use: No     Allergies   Benadryl [diphenhydramine]; Tamsulosin; Alendronate sodium; Duloxetine; Duloxetine hcl; Risedronate sodium; Lovastatin; and Sulfa antibiotics   Review of Systems Review of Systems   Physical Exam Triage Vital Signs ED Triage Vitals  Enc Vitals Group     BP 07/02/17 1202 140/81     Pulse Rate 07/02/17 1202 80     Resp 07/02/17 1202 16     Temp 07/02/17 1202 98 F (36.7 C)     Temp Source 07/02/17 1202 Oral     SpO2 07/02/17 1202 97 %     Weight 07/02/17 1201 139 lb (63 kg)     Height 07/02/17 1201 5' (1.524 m)     Head Circumference --      Peak Flow --      Pain Score 07/02/17 1201 0     Pain Loc --      Pain Edu? --      Excl. in [place]? --    No data found.  Updated Vital Signs BP 140/81 (BP Location: Left Arm)   Pulse 80   Temp 98 F (36.7 C) (Oral)   Resp  16   Ht 5' (1.524 m)   Wt 139 lb (63 kg)   SpO2 97%   BMI 27.15 kg/m  Visual Acuity Right Eye Distance:   Left Eye Distance:   Bilateral Distance:    Right Eye Near:   Left Eye Near:    Bilateral Near:     Physical Exam  Constitutional: She appears well-developed and well-nourished. No distress.  Musculoskeletal: She exhibits edema (right lower leg).  Skin: She is not diaphoretic.  Nursing note and vitals reviewed.    UC Treatments / Results  Labs (all labs ordered are listed, but only abnormal results are displayed) Labs Reviewed - No data to display  EKG None  Radiology No results found.  Procedures Procedures (including critical care time)  Medications Ordered in UC Medications - No data to display  Initial Impression / Assessment and Plan / UC Course  I have reviewed the triage vital signs and the nursing notes.  Pertinent labs & imaging results that were available during my care of the patient were reviewed by me and considered in my medical decision making (see chart for details).      Final Clinical Impressions(s) / UC Diagnoses   Final diagnoses:  Lymphedema     Discharge Instructions     Continue with compression stockings Elevate leg    ED Prescriptions    None     1. diagnosis reviewed with patient 2. Recommend supportive treatment as above  3. Follow-up prn if symptoms worsen or don't improve   Controlled Substance Prescriptions [place] Controlled Substance Registry consulted? Not Applicable   Norval Gable, MD 07/02/17 1251

## 2017-07-02 NOTE — ED Triage Notes (Addendum)
Pt with right lower leg swelling due to lymph node (chronic) and scheduled to start radiation next week to shrink it. Yesterday they noticed the leg is seeping fluid. Denies pain

## 2017-07-02 NOTE — Telephone Encounter (Signed)
Nira Conn- please call Ph- [phone] Angie re: orders for PT at [facility].   Lymphedema physical therapy- is recommended given right leg Lymphedema.   Thx GB

## 2017-07-02 NOTE — Telephone Encounter (Signed)
Left vm for Angie - Doctor, general practice at [facility] 1619 to return my ph call.

## 2017-07-02 NOTE — Telephone Encounter (Signed)
Daughter [name] called to report that patient has been diagnosed with Lymphedema today at Urgent Care and they were not happy with the care she received. She is asking what can be done for her mother and would like a return call [phone]. She wanders if she can see Dr B

## 2017-07-02 NOTE — Discharge Instructions (Signed)
Continue with compression stockings Elevate leg

## 2017-07-03 NOTE — Telephone Encounter (Signed)
Daughter updated with plan of care. We are attempting to reach out to Angie at [facility] assisted living. Attempting to obtain lymphadema PT at facility rather than outpatient PT. Daughter gave verbal understanding of the plan of care.

## 2017-07-03 NOTE — Telephone Encounter (Signed)
2nd phone phone call placed to [facility] at 1403 today. I spoke with receptionist that explained that Angie was not available and that she was in a staff mtg. I explained that I left a vm for Angie yesterday afternoon, but she had not returned my phone call. I need to discuss this patient's care and lymphedema PT for right leg. She stated that she would put a note on Angie's door to give me a call as soon as possible.

## 2017-07-04 ENCOUNTER — Other Ambulatory Visit: Payer: Self-pay | Admitting: *Deleted

## 2017-07-04 DIAGNOSIS — C5701 Malignant neoplasm of right fallopian tube: Secondary | ICD-10-CM

## 2017-07-04 NOTE — Telephone Encounter (Signed)
Angie left a vm that [facility] does not offer lymphedema PT services. Will send orders for outpatient lymphedema clinic

## 2017-07-04 NOTE — Telephone Encounter (Signed)
Daughter updated on the plan of care. She was informed to contact our office if she does not hear anything about from the lymphedema referral within the next week

## 2017-07-04 NOTE — Addendum Note (Signed)
Addended by: Sabino Gasser on: 07/04/2017 02:58 PM   Modules accepted: Orders

## 2017-07-07 DIAGNOSIS — I2699 Other pulmonary embolism without acute cor pulmonale: Secondary | ICD-10-CM | POA: Insufficient documentation

## 2017-07-07 DIAGNOSIS — Z8673 Personal history of transient ischemic attack (TIA), and cerebral infarction without residual deficits: Secondary | ICD-10-CM | POA: Diagnosis not present

## 2017-07-07 DIAGNOSIS — Z510 Encounter for antineoplastic radiation therapy: Secondary | ICD-10-CM | POA: Diagnosis not present

## 2017-07-07 DIAGNOSIS — C774 Secondary and unspecified malignant neoplasm of inguinal and lower limb lymph nodes: Secondary | ICD-10-CM | POA: Insufficient documentation

## 2017-07-07 DIAGNOSIS — Z7982 Long term (current) use of aspirin: Secondary | ICD-10-CM | POA: Diagnosis not present

## 2017-07-07 DIAGNOSIS — Z79899 Other long term (current) drug therapy: Secondary | ICD-10-CM | POA: Insufficient documentation

## 2017-07-07 DIAGNOSIS — Z7901 Long term (current) use of anticoagulants: Secondary | ICD-10-CM | POA: Diagnosis not present

## 2017-07-07 DIAGNOSIS — C5701 Malignant neoplasm of right fallopian tube: Secondary | ICD-10-CM | POA: Insufficient documentation

## 2017-07-09 ENCOUNTER — Ambulatory Visit
Admission: RE | Admit: 2017-07-09 | Discharge: 2017-07-09 | Disposition: A | Discharge status: Home / Self Care | Payer: Medicare HMO | Source: Ambulatory Visit | Attending: Radiation Oncology | Admitting: Radiation Oncology

## 2017-07-09 DIAGNOSIS — Z7901 Long term (current) use of anticoagulants: Secondary | ICD-10-CM | POA: Diagnosis not present

## 2017-07-09 DIAGNOSIS — C774 Secondary and unspecified malignant neoplasm of inguinal and lower limb lymph nodes: Secondary | ICD-10-CM | POA: Diagnosis not present

## 2017-07-09 DIAGNOSIS — Z7982 Long term (current) use of aspirin: Secondary | ICD-10-CM | POA: Diagnosis not present

## 2017-07-09 DIAGNOSIS — C5701 Malignant neoplasm of right fallopian tube: Secondary | ICD-10-CM | POA: Diagnosis not present

## 2017-07-09 DIAGNOSIS — Z8673 Personal history of transient ischemic attack (TIA), and cerebral infarction without residual deficits: Secondary | ICD-10-CM | POA: Diagnosis not present

## 2017-07-09 DIAGNOSIS — Z510 Encounter for antineoplastic radiation therapy: Secondary | ICD-10-CM | POA: Diagnosis not present

## 2017-07-09 DIAGNOSIS — Z79899 Other long term (current) drug therapy: Secondary | ICD-10-CM | POA: Diagnosis not present

## 2017-07-09 DIAGNOSIS — I2699 Other pulmonary embolism without acute cor pulmonale: Secondary | ICD-10-CM | POA: Diagnosis not present

## 2017-07-10 ENCOUNTER — Ambulatory Visit
Admission: RE | Admit: 2017-07-10 | Discharge: 2017-07-10 | Disposition: A | Discharge status: Home / Self Care | Payer: Medicare HMO | Source: Ambulatory Visit | Attending: Radiation Oncology | Admitting: Radiation Oncology

## 2017-07-10 DIAGNOSIS — Z7982 Long term (current) use of aspirin: Secondary | ICD-10-CM | POA: Diagnosis not present

## 2017-07-10 DIAGNOSIS — Z8673 Personal history of transient ischemic attack (TIA), and cerebral infarction without residual deficits: Secondary | ICD-10-CM | POA: Diagnosis not present

## 2017-07-10 DIAGNOSIS — Z79899 Other long term (current) drug therapy: Secondary | ICD-10-CM | POA: Diagnosis not present

## 2017-07-10 DIAGNOSIS — C5701 Malignant neoplasm of right fallopian tube: Secondary | ICD-10-CM | POA: Diagnosis not present

## 2017-07-10 DIAGNOSIS — I2699 Other pulmonary embolism without acute cor pulmonale: Secondary | ICD-10-CM | POA: Diagnosis not present

## 2017-07-10 DIAGNOSIS — Z7901 Long term (current) use of anticoagulants: Secondary | ICD-10-CM | POA: Diagnosis not present

## 2017-07-10 DIAGNOSIS — Z510 Encounter for antineoplastic radiation therapy: Secondary | ICD-10-CM | POA: Diagnosis not present

## 2017-07-10 DIAGNOSIS — C774 Secondary and unspecified malignant neoplasm of inguinal and lower limb lymph nodes: Secondary | ICD-10-CM | POA: Diagnosis not present

## 2017-07-11 ENCOUNTER — Ambulatory Visit
Admission: RE | Admit: 2017-07-11 | Discharge: 2017-07-11 | Disposition: A | Discharge status: Home / Self Care | Payer: Medicare HMO | Source: Ambulatory Visit | Attending: Radiation Oncology | Admitting: Radiation Oncology

## 2017-07-11 DIAGNOSIS — I2699 Other pulmonary embolism without acute cor pulmonale: Secondary | ICD-10-CM | POA: Diagnosis not present

## 2017-07-11 DIAGNOSIS — Z79899 Other long term (current) drug therapy: Secondary | ICD-10-CM | POA: Diagnosis not present

## 2017-07-11 DIAGNOSIS — Z7982 Long term (current) use of aspirin: Secondary | ICD-10-CM | POA: Diagnosis not present

## 2017-07-11 DIAGNOSIS — C774 Secondary and unspecified malignant neoplasm of inguinal and lower limb lymph nodes: Secondary | ICD-10-CM | POA: Diagnosis not present

## 2017-07-11 DIAGNOSIS — C5701 Malignant neoplasm of right fallopian tube: Secondary | ICD-10-CM | POA: Diagnosis not present

## 2017-07-11 DIAGNOSIS — Z7901 Long term (current) use of anticoagulants: Secondary | ICD-10-CM | POA: Diagnosis not present

## 2017-07-11 DIAGNOSIS — Z8673 Personal history of transient ischemic attack (TIA), and cerebral infarction without residual deficits: Secondary | ICD-10-CM | POA: Diagnosis not present

## 2017-07-11 DIAGNOSIS — Z510 Encounter for antineoplastic radiation therapy: Secondary | ICD-10-CM | POA: Diagnosis not present

## 2017-07-14 ENCOUNTER — Ambulatory Visit
Admission: RE | Admit: 2017-07-14 | Discharge: 2017-07-14 | Disposition: A | Discharge status: Home / Self Care | Payer: Medicare HMO | Source: Ambulatory Visit | Attending: Radiation Oncology | Admitting: Radiation Oncology

## 2017-07-14 ENCOUNTER — Other Ambulatory Visit: Payer: Self-pay | Admitting: *Deleted

## 2017-07-14 ENCOUNTER — Encounter: Payer: Self-pay | Admitting: *Deleted

## 2017-07-14 ENCOUNTER — Encounter: Payer: Self-pay | Admitting: Internal Medicine

## 2017-07-14 DIAGNOSIS — Z7982 Long term (current) use of aspirin: Secondary | ICD-10-CM | POA: Diagnosis not present

## 2017-07-14 DIAGNOSIS — Z8673 Personal history of transient ischemic attack (TIA), and cerebral infarction without residual deficits: Secondary | ICD-10-CM | POA: Diagnosis not present

## 2017-07-14 DIAGNOSIS — Z7901 Long term (current) use of anticoagulants: Secondary | ICD-10-CM | POA: Diagnosis not present

## 2017-07-14 DIAGNOSIS — C774 Secondary and unspecified malignant neoplasm of inguinal and lower limb lymph nodes: Secondary | ICD-10-CM | POA: Diagnosis not present

## 2017-07-14 DIAGNOSIS — C5701 Malignant neoplasm of right fallopian tube: Secondary | ICD-10-CM | POA: Diagnosis not present

## 2017-07-14 DIAGNOSIS — Z79899 Other long term (current) drug therapy: Secondary | ICD-10-CM | POA: Diagnosis not present

## 2017-07-14 DIAGNOSIS — Z510 Encounter for antineoplastic radiation therapy: Secondary | ICD-10-CM | POA: Diagnosis not present

## 2017-07-14 DIAGNOSIS — I2699 Other pulmonary embolism without acute cor pulmonale: Secondary | ICD-10-CM | POA: Diagnosis not present

## 2017-07-14 MED ORDER — LOPERAMIDE HCL 2 MG PO CAPS: 2.0000 mg | ORAL_CAPSULE | ORAL | 0 refills | Status: DC | PRN

## 2017-07-15 ENCOUNTER — Ambulatory Visit
Admission: RE | Admit: 2017-07-15 | Discharge: 2017-07-15 | Disposition: A | Discharge status: Home / Self Care | Payer: Medicare HMO | Source: Ambulatory Visit | Attending: Radiation Oncology | Admitting: Radiation Oncology

## 2017-07-15 DIAGNOSIS — Z7982 Long term (current) use of aspirin: Secondary | ICD-10-CM | POA: Diagnosis not present

## 2017-07-15 DIAGNOSIS — Z510 Encounter for antineoplastic radiation therapy: Secondary | ICD-10-CM | POA: Diagnosis not present

## 2017-07-15 DIAGNOSIS — Z7901 Long term (current) use of anticoagulants: Secondary | ICD-10-CM | POA: Diagnosis not present

## 2017-07-15 DIAGNOSIS — C774 Secondary and unspecified malignant neoplasm of inguinal and lower limb lymph nodes: Secondary | ICD-10-CM | POA: Diagnosis not present

## 2017-07-15 DIAGNOSIS — Z79899 Other long term (current) drug therapy: Secondary | ICD-10-CM | POA: Diagnosis not present

## 2017-07-15 DIAGNOSIS — I2699 Other pulmonary embolism without acute cor pulmonale: Secondary | ICD-10-CM | POA: Diagnosis not present

## 2017-07-15 DIAGNOSIS — Z8673 Personal history of transient ischemic attack (TIA), and cerebral infarction without residual deficits: Secondary | ICD-10-CM | POA: Diagnosis not present

## 2017-07-15 DIAGNOSIS — C5701 Malignant neoplasm of right fallopian tube: Secondary | ICD-10-CM | POA: Diagnosis not present

## 2017-07-16 ENCOUNTER — Inpatient Hospital Stay: Payer: Medicare HMO | Attending: Internal Medicine

## 2017-07-16 ENCOUNTER — Ambulatory Visit
Admission: RE | Admit: 2017-07-16 | Discharge: 2017-07-16 | Disposition: A | Discharge status: Home / Self Care | Payer: Medicare HMO | Source: Ambulatory Visit | Attending: Radiation Oncology | Admitting: Radiation Oncology

## 2017-07-16 DIAGNOSIS — C774 Secondary and unspecified malignant neoplasm of inguinal and lower limb lymph nodes: Secondary | ICD-10-CM | POA: Diagnosis not present

## 2017-07-16 DIAGNOSIS — Z510 Encounter for antineoplastic radiation therapy: Secondary | ICD-10-CM | POA: Diagnosis not present

## 2017-07-16 DIAGNOSIS — I2699 Other pulmonary embolism without acute cor pulmonale: Secondary | ICD-10-CM | POA: Diagnosis not present

## 2017-07-16 DIAGNOSIS — Z8673 Personal history of transient ischemic attack (TIA), and cerebral infarction without residual deficits: Secondary | ICD-10-CM | POA: Diagnosis not present

## 2017-07-16 DIAGNOSIS — Z79899 Other long term (current) drug therapy: Secondary | ICD-10-CM | POA: Diagnosis not present

## 2017-07-16 DIAGNOSIS — Z7982 Long term (current) use of aspirin: Secondary | ICD-10-CM | POA: Diagnosis not present

## 2017-07-16 DIAGNOSIS — Z7901 Long term (current) use of anticoagulants: Secondary | ICD-10-CM | POA: Diagnosis not present

## 2017-07-16 DIAGNOSIS — C5701 Malignant neoplasm of right fallopian tube: Secondary | ICD-10-CM | POA: Diagnosis not present

## 2017-07-17 ENCOUNTER — Ambulatory Visit
Admission: RE | Admit: 2017-07-17 | Discharge: 2017-07-17 | Disposition: A | Discharge status: Home / Self Care | Payer: Medicare HMO | Source: Ambulatory Visit | Attending: Radiation Oncology | Admitting: Radiation Oncology

## 2017-07-17 DIAGNOSIS — C774 Secondary and unspecified malignant neoplasm of inguinal and lower limb lymph nodes: Secondary | ICD-10-CM | POA: Diagnosis not present

## 2017-07-17 DIAGNOSIS — Z8673 Personal history of transient ischemic attack (TIA), and cerebral infarction without residual deficits: Secondary | ICD-10-CM | POA: Diagnosis not present

## 2017-07-17 DIAGNOSIS — I2699 Other pulmonary embolism without acute cor pulmonale: Secondary | ICD-10-CM | POA: Diagnosis not present

## 2017-07-17 DIAGNOSIS — C5701 Malignant neoplasm of right fallopian tube: Secondary | ICD-10-CM | POA: Diagnosis not present

## 2017-07-17 DIAGNOSIS — Z7901 Long term (current) use of anticoagulants: Secondary | ICD-10-CM | POA: Diagnosis not present

## 2017-07-17 DIAGNOSIS — Z510 Encounter for antineoplastic radiation therapy: Secondary | ICD-10-CM | POA: Diagnosis not present

## 2017-07-17 DIAGNOSIS — Z7982 Long term (current) use of aspirin: Secondary | ICD-10-CM | POA: Diagnosis not present

## 2017-07-17 DIAGNOSIS — Z79899 Other long term (current) drug therapy: Secondary | ICD-10-CM | POA: Diagnosis not present

## 2017-07-18 ENCOUNTER — Ambulatory Visit
Admission: RE | Admit: 2017-07-18 | Discharge: 2017-07-18 | Disposition: A | Discharge status: Home / Self Care | Payer: Medicare HMO | Source: Ambulatory Visit | Attending: Radiation Oncology | Admitting: Radiation Oncology

## 2017-07-18 DIAGNOSIS — Z8673 Personal history of transient ischemic attack (TIA), and cerebral infarction without residual deficits: Secondary | ICD-10-CM | POA: Diagnosis not present

## 2017-07-18 DIAGNOSIS — I2699 Other pulmonary embolism without acute cor pulmonale: Secondary | ICD-10-CM | POA: Diagnosis not present

## 2017-07-18 DIAGNOSIS — C5701 Malignant neoplasm of right fallopian tube: Secondary | ICD-10-CM | POA: Diagnosis not present

## 2017-07-18 DIAGNOSIS — C774 Secondary and unspecified malignant neoplasm of inguinal and lower limb lymph nodes: Secondary | ICD-10-CM | POA: Diagnosis not present

## 2017-07-18 DIAGNOSIS — Z510 Encounter for antineoplastic radiation therapy: Secondary | ICD-10-CM | POA: Diagnosis not present

## 2017-07-18 DIAGNOSIS — Z7982 Long term (current) use of aspirin: Secondary | ICD-10-CM | POA: Diagnosis not present

## 2017-07-18 DIAGNOSIS — Z79899 Other long term (current) drug therapy: Secondary | ICD-10-CM | POA: Diagnosis not present

## 2017-07-18 DIAGNOSIS — Z7901 Long term (current) use of anticoagulants: Secondary | ICD-10-CM | POA: Diagnosis not present

## 2017-07-21 ENCOUNTER — Ambulatory Visit
Admission: RE | Admit: 2017-07-21 | Discharge: 2017-07-21 | Disposition: A | Discharge status: Home / Self Care | Payer: Medicare HMO | Source: Ambulatory Visit | Attending: Radiation Oncology | Admitting: Radiation Oncology

## 2017-07-21 DIAGNOSIS — Z8673 Personal history of transient ischemic attack (TIA), and cerebral infarction without residual deficits: Secondary | ICD-10-CM | POA: Diagnosis not present

## 2017-07-21 DIAGNOSIS — C774 Secondary and unspecified malignant neoplasm of inguinal and lower limb lymph nodes: Secondary | ICD-10-CM | POA: Diagnosis not present

## 2017-07-21 DIAGNOSIS — Z7982 Long term (current) use of aspirin: Secondary | ICD-10-CM | POA: Diagnosis not present

## 2017-07-21 DIAGNOSIS — Z79899 Other long term (current) drug therapy: Secondary | ICD-10-CM | POA: Diagnosis not present

## 2017-07-21 DIAGNOSIS — I2699 Other pulmonary embolism without acute cor pulmonale: Secondary | ICD-10-CM | POA: Diagnosis not present

## 2017-07-21 DIAGNOSIS — C5701 Malignant neoplasm of right fallopian tube: Secondary | ICD-10-CM | POA: Diagnosis not present

## 2017-07-21 DIAGNOSIS — Z7901 Long term (current) use of anticoagulants: Secondary | ICD-10-CM | POA: Diagnosis not present

## 2017-07-21 DIAGNOSIS — Z510 Encounter for antineoplastic radiation therapy: Secondary | ICD-10-CM | POA: Diagnosis not present

## 2017-07-22 ENCOUNTER — Other Ambulatory Visit: Payer: Self-pay

## 2017-07-22 ENCOUNTER — Ambulatory Visit
Admission: RE | Admit: 2017-07-22 | Discharge: 2017-07-22 | Disposition: A | Discharge status: Home / Self Care | Payer: Medicare HMO | Source: Ambulatory Visit | Attending: Radiation Oncology | Admitting: Radiation Oncology

## 2017-07-22 ENCOUNTER — Other Ambulatory Visit (INDEPENDENT_AMBULATORY_CARE_PROVIDER_SITE_OTHER): Payer: Medicare HMO

## 2017-07-22 DIAGNOSIS — Z7982 Long term (current) use of aspirin: Secondary | ICD-10-CM | POA: Diagnosis not present

## 2017-07-22 DIAGNOSIS — C774 Secondary and unspecified malignant neoplasm of inguinal and lower limb lymph nodes: Secondary | ICD-10-CM | POA: Diagnosis not present

## 2017-07-22 DIAGNOSIS — Z7901 Long term (current) use of anticoagulants: Secondary | ICD-10-CM | POA: Diagnosis not present

## 2017-07-22 DIAGNOSIS — C5701 Malignant neoplasm of right fallopian tube: Secondary | ICD-10-CM | POA: Diagnosis not present

## 2017-07-22 DIAGNOSIS — Z510 Encounter for antineoplastic radiation therapy: Secondary | ICD-10-CM | POA: Diagnosis not present

## 2017-07-22 DIAGNOSIS — N390 Urinary tract infection, site not specified: Secondary | ICD-10-CM | POA: Diagnosis not present

## 2017-07-22 DIAGNOSIS — Z79899 Other long term (current) drug therapy: Secondary | ICD-10-CM | POA: Diagnosis not present

## 2017-07-22 DIAGNOSIS — I2699 Other pulmonary embolism without acute cor pulmonale: Secondary | ICD-10-CM | POA: Diagnosis not present

## 2017-07-22 DIAGNOSIS — Z8673 Personal history of transient ischemic attack (TIA), and cerebral infarction without residual deficits: Secondary | ICD-10-CM | POA: Diagnosis not present

## 2017-07-22 LAB — POCT URINALYSIS DIPSTICK
BILIRUBIN UA: NEGATIVE
Blood, UA: NEGATIVE
GLUCOSE UA: NEGATIVE
KETONES UA: NEGATIVE
Nitrite, UA: NEGATIVE
ODOR: NEGATIVE
PROTEIN UA: NEGATIVE
SPEC GRAV UA: 1.01 (ref 1.010–1.025)
Urobilinogen, UA: 0.2 E.U./dL
pH, UA: 7.5 (ref 5.0–8.0)

## 2017-07-22 MED ORDER — NITROFURANTOIN MONOHYD MACRO 100 MG PO CAPS: 100.0000 mg | ORAL_CAPSULE | Freq: Two times a day (BID) | ORAL | 0 refills | Status: DC

## 2017-07-23 ENCOUNTER — Telehealth: Payer: Self-pay | Admitting: Obstetrics and Gynecology

## 2017-07-23 NOTE — Telephone Encounter (Signed)
LM with [facility]- Letter signed by mad and faxed with confirmation.

## 2017-07-23 NOTE — Telephone Encounter (Signed)
Nicole Clayton from [facility] called ad stated that they received a fax on the patient in regards to her starting a medication. The Employee stated that they are not able to  Start the patient on the medication due to the order/prescription came from a "CMA" The employee stated that the order/prescription has to come from a "Doctor". The call back number for Nicole Clayton is [phone]. Please advise.

## 2017-07-24 ENCOUNTER — Other Ambulatory Visit: Payer: Self-pay | Admitting: *Deleted

## 2017-07-24 DIAGNOSIS — C5701 Malignant neoplasm of right fallopian tube: Secondary | ICD-10-CM

## 2017-07-24 LAB — URINE CULTURE

## 2017-07-29 ENCOUNTER — Inpatient Hospital Stay: Payer: Medicare HMO | Attending: Internal Medicine

## 2017-07-29 ENCOUNTER — Other Ambulatory Visit: Payer: Self-pay

## 2017-07-29 ENCOUNTER — Inpatient Hospital Stay (HOSPITAL_BASED_OUTPATIENT_CLINIC_OR_DEPARTMENT_OTHER): Payer: Medicare HMO | Admitting: Internal Medicine

## 2017-07-29 ENCOUNTER — Encounter: Payer: Self-pay | Admitting: Internal Medicine

## 2017-07-29 ENCOUNTER — Ambulatory Visit: Payer: Medicare HMO | Attending: Internal Medicine | Admitting: Occupational Therapy

## 2017-07-29 ENCOUNTER — Encounter: Payer: Self-pay | Admitting: Occupational Therapy

## 2017-07-29 VITALS — BP 102/58 | HR 70 | Temp 98.0°F | Resp 18 | Ht 60.0 in | Wt 136.7 lb

## 2017-07-29 DIAGNOSIS — C5701 Malignant neoplasm of right fallopian tube: Secondary | ICD-10-CM | POA: Insufficient documentation

## 2017-07-29 DIAGNOSIS — I2699 Other pulmonary embolism without acute cor pulmonale: Secondary | ICD-10-CM

## 2017-07-29 DIAGNOSIS — Z7901 Long term (current) use of anticoagulants: Secondary | ICD-10-CM | POA: Insufficient documentation

## 2017-07-29 DIAGNOSIS — Z90710 Acquired absence of both cervix and uterus: Secondary | ICD-10-CM

## 2017-07-29 DIAGNOSIS — M7989 Other specified soft tissue disorders: Secondary | ICD-10-CM

## 2017-07-29 DIAGNOSIS — Z8673 Personal history of transient ischemic attack (TIA), and cerebral infarction without residual deficits: Secondary | ICD-10-CM | POA: Diagnosis not present

## 2017-07-29 DIAGNOSIS — C774 Secondary and unspecified malignant neoplasm of inguinal and lower limb lymph nodes: Secondary | ICD-10-CM | POA: Diagnosis not present

## 2017-07-29 DIAGNOSIS — I890 Lymphedema, not elsewhere classified: Secondary | ICD-10-CM | POA: Diagnosis not present

## 2017-07-29 DIAGNOSIS — R5383 Other fatigue: Secondary | ICD-10-CM | POA: Insufficient documentation

## 2017-07-29 DIAGNOSIS — R5381 Other malaise: Secondary | ICD-10-CM

## 2017-07-29 DIAGNOSIS — Z923 Personal history of irradiation: Secondary | ICD-10-CM | POA: Insufficient documentation

## 2017-07-29 LAB — CBC WITH DIFFERENTIAL/PLATELET
BASOS PCT: 1 %
Basophils Absolute: 0.1 10*3/uL (ref 0–0.1)
Eosinophils Absolute: 0 10*3/uL (ref 0–0.7)
Eosinophils Relative: 1 %
HEMATOCRIT: 36.2 % (ref 35.0–47.0)
HEMOGLOBIN: 11.8 g/dL — AB (ref 12.0–16.0)
Lymphocytes Relative: 20 %
Lymphs Abs: 0.7 10*3/uL — ABNORMAL LOW (ref 1.0–3.6)
MCH: 29.8 pg (ref 26.0–34.0)
MCHC: 32.6 g/dL (ref 32.0–36.0)
MCV: 91.5 fL (ref 80.0–100.0)
MONO ABS: 0.6 10*3/uL (ref 0.2–0.9)
Monocytes Relative: 18 %
NEUTROS ABS: 2.1 10*3/uL (ref 1.4–6.5)
NEUTROS PCT: 60 %
Platelets: 393 10*3/uL (ref 150–440)
RBC: 3.96 MIL/uL (ref 3.80–5.20)
RDW: 14.8 % — AB (ref 11.5–14.5)
WBC: 3.6 10*3/uL (ref 3.6–11.0)

## 2017-07-29 LAB — COMPREHENSIVE METABOLIC PANEL
ALBUMIN: 3.7 g/dL (ref 3.5–5.0)
ALK PHOS: 69 U/L (ref 38–126)
ALT: 10 U/L (ref 0–44)
AST: 23 U/L (ref 15–41)
Anion gap: 10 (ref 5–15)
BILIRUBIN TOTAL: 0.3 mg/dL (ref 0.3–1.2)
BUN: 23 mg/dL (ref 8–23)
CALCIUM: 8.9 mg/dL (ref 8.9–10.3)
CO2: 27 mmol/L (ref 22–32)
Chloride: 103 mmol/L (ref 98–111)
Creatinine, Ser: 0.97 mg/dL (ref 0.44–1.00)
GFR calc Af Amer: 59 mL/min — ABNORMAL LOW (ref >60)
GFR calc non Af Amer: 51 mL/min — ABNORMAL LOW (ref >60)
Glucose, Bld: 96 mg/dL (ref 70–99)
POTASSIUM: 4.7 mmol/L (ref 3.5–5.1)
SODIUM: 140 mmol/L (ref 135–145)
TOTAL PROTEIN: 7.3 g/dL (ref 6.5–8.1)

## 2017-07-29 NOTE — Patient Instructions (Signed)

## 2017-07-29 NOTE — Progress Notes (Signed)
[name], [name]  Cancer Staging No matching staging information was found for the patient.   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  Adequate TRS, no macroscopic residual. IP/IV chemotherapy with DDP and paclitaxel on GOG protocol, chemo and Bev consolidation completed in 03/2011 2. 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 November 14, 2011- Patient was started on carboplatin and gemcitabine 4. Finished 6 cycles of chemotherapy in April of 2014 with carboplatin and gemcitabine. Tolerance was fairly good except for neutropenia and thrombocytopenia. 5.recurrent disease by CT scan February of 2015  # .radiation therapy to pelvis  May of 2015  7.upper extremity  . and deep vein thrombosis associated with port.(June of 2015) patient started on   [place] had port removed and had   thrombectomy September 06, 2013. # CT DEC 2016- progressing disease in the right inguinal area # FEB-TAXOL-AVASTIN;  # FEB 2017- AVASTIN q 3W; July 2017- CT- Improved  Right Inguinal LN; STOP AVASTIN sec to potential concerns of AEs  # AUG 2017 3rd- START ZEJULA- declines sec to intol  # OCT 7th CT- STABLE RIGHT INGUINAL LN- RE-START AVASTIN q 3W [STOPPED oct 25th 2018]; Avastin discontinued October 2018 [stroke/PE]  MOLECULAR TESTING: somatic BRCA negative; F-ONE: No targets*   Dx: SEP 2013  fallopian tube/platinum sensitive high-grade serous-cancer  Current/most recent therapy- Avastin discon [oct 2018] Goals: palliative; stage: Metastatic/Recurrent - IV         Malignant neoplasm of right fallopian tube [facility])      INTERVAL HISTORY:  Nicole Clayton 84 y.o.  female pleasant patient above history of metastatic fallopian tube high-grade serous most recently status post radiation to her  right inguinal region is here for follow-up.  Patient continues to have swelling of the right lower extremity.  She is continues to wear compression stockings.  She is awaiting to see lymphedema physical therapy this afternoon.  Complains of mild to moderate fatigue.  Otherwise denies any abdominal pain denies any shortness of breath or cough.  Denies any worsening abdominal distention.  Review of Systems  Constitutional: Positive for malaise/fatigue. Negative for chills, diaphoresis, fever and weight loss.  HENT: Negative for nosebleeds and sore throat.   Eyes: Negative for double vision.  Respiratory: Negative for cough, hemoptysis, sputum production, shortness of breath and wheezing.   Cardiovascular: Positive for leg swelling. Negative for chest pain, palpitations and orthopnea.  Gastrointestinal: Negative for abdominal pain, blood in stool, constipation, diarrhea, heartburn, melena, nausea and vomiting.  Genitourinary: Negative for dysuria, frequency and urgency.  Musculoskeletal: Negative for back pain and joint pain.  Skin: Negative.  Negative for itching and rash.  Neurological: Negative for dizziness, tingling, focal weakness, weakness and headaches.  Endo/Heme/Allergies: Does not bruise/bleed easily.  Psychiatric/Behavioral: Negative for depression. The patient is not nervous/anxious and does not have insomnia.       PAST MEDICAL HISTORY :  Past Medical History:  Diagnosis Date  . Bladder infection, chronic 10/19/2011  . Cancer (HCC)    Ovarian   . 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 Claiborne Rigg and Delorise Shiner Choksi Overview:  Drs. Claiborne Rigg and [employer]   . Concussion   . GERD (gastroesophageal reflux disease)   . MI (mitral incompetence) 10/27/2014   Overview:  MODERATE   . Mitral valve prolapse   . OAB (overactive bladder)   . Ovarian cancer ([place])   . Pulmonary embolism ([place])   . Tachycardia     PAST SURGICAL  HISTORY :   Past Surgical History:  Procedure Laterality Date  . ABDOMINAL HYSTERECTOMY    . CHOLECYSTECTOMY    . LAPAROSCOPIC BILATERAL SALPINGO OOPHERECTOMY  2011  . ovarian cancer surgery  2011    FAMILY HISTORY :   Family History  Problem Relation Age of Onset  . Ovarian cancer Other   . Breast cancer Neg Hx   . Colon cancer Neg Hx   . Diabetes Neg Hx   . Heart disease Neg Hx   . Kidney disease Neg Hx   . Bladder Cancer Neg Hx     SOCIAL HISTORY:   Social History   Tobacco Use  . Smoking status: Never Smoker  . Smokeless tobacco: Never Used  Substance Use Topics  . Alcohol use: No  . Drug use: No    ALLERGIES:  is allergic to benadryl [diphenhydramine]; tamsulosin; alendronate sodium; duloxetine; duloxetine hcl; risedronate sodium; lovastatin; and sulfa antibiotics.  MEDICATIONS:  Current Outpatient Medications  Medication Sig Dispense Refill  . acetaminophen (TYLENOL) 650 MG CR tablet Take 650 mg by mouth every 8 (eight) hours as needed for pain.    Marland Kitchen apixaban (ELIQUIS) 5 MG TABS tablet Take 1 tablet (5 mg total) by mouth 2 (two) times daily. 60 tablet 5  . Ascorbic Acid (VITAMIN C) 100 MG tablet Take 100 mg by mouth daily.    Marland Kitchen aspirin EC 81 MG tablet Take 81 mg by mouth.    . cephALEXin (KEFLEX) 250 MG capsule Take 250 mg by mouth.     . cetirizine (ZYRTEC) 10 MG tablet Take 10 mg by mouth daily.     . cholecalciferol (VITAMIN D) 1000 units tablet Take 1,000 Units by mouth daily.     Marland Kitchen docusate sodium (COLACE) 100 MG capsule Take 100 mg by mouth daily as needed for mild constipation.     . fluticasone (FLONASE) 50 MCG/ACT nasal spray Place 1 spray into both nostrils daily.     Marland Kitchen gabapentin (NEURONTIN) 600 MG tablet TAKE (1) TABLET BY MOUTH THREE TIMES A DAY (Patient taking differently: Take 600 mg by mouth 2 (two) times daily as needed. TAKE (1) TABLET BY MOUTH THREE TIMES A DAY) 180 tablet 0  . metoprolol succinate (TOPROL-XL) 50 MG 24 hr tablet     . naproxen  sodium (ANAPROX) 220 MG tablet Take 220 mg by mouth 2 (two) times daily with a meal.    . nystatin ointment (MYCOSTATIN) Apply 1 application topically 2 (two) times daily. 30 g 1  . ondansetron (ZOFRAN) 4 MG tablet Take 4 mg by mouth every 8 (eight) hours as needed for nausea or vomiting.    . pantoprazole (PROTONIX) 40 MG tablet Take 40 mg by mouth daily.     Marland Kitchen loperamide (IMODIUM) 2 MG capsule Take 1 capsule (2 mg total) by mouth as needed for diarrhea or loose stools. Take 2 capsules after 1st loose stool, then 1 capsule after subsequent loose stools up to 8 mg per 24 hours. (Patient not taking: Reported on 07/29/2017) 30 capsule 0  . nitrofurantoin, macrocrystal-monohydrate, (MACROBID) 100 MG capsule Take 1 capsule (100 mg total) by mouth 2 (two) times daily. (Patient not taking: Reported on 07/29/2017) 14 capsule 0  . triamcinolone ointment (KENALOG) 0.1 % Apply 1 application topically 2 (  two) times daily. (Patient not taking: Reported on 06/26/2017) 30 g 1   No current facility-administered medications for this visit.    Facility-Administered Medications Ordered in Other Visits  Medication Dose Route Frequency Provider Last Rate Last Dose  . diphenhydrAMINE (BENADRYL) injection 50 mg  50 mg Intravenous Once Choksi, Janak, MD      . sodium chloride 0.9 % injection 10 mL  10 mL Intravenous PRN Forest Gleason, MD   10 mL at 01/12/15 1116  . sodium chloride 0.9 % injection 10 mL  10 mL Intravenous PRN Forest Gleason, MD   10 mL at 02/16/15 1025    PHYSICAL EXAMINATION: ECOG PERFORMANCE STATUS: 1 - Symptomatic but completely ambulatory  BP (!) 102/58 (BP Location: Left Arm, Patient Position: Sitting)   Pulse 70   Temp 98 F (36.7 C) (Oral)   Resp 18   Ht 5' (1.524 m)   Wt 136 lb 11 oz (62 kg)   BMI 26.69 kg/m   Filed Weights   07/29/17 0953  Weight: 136 lb 11 oz (62 kg)    GENERAL: Well-nourished well-developed; Alert, no distress and comfortable.  Accompanied by family.  She walks  with a walker. EYES: no pallor or icterus OROPHARYNX: no thrush or ulceration; NECK: supple; no lymph nodes felt. LYMPH:  no palpable lymphadenopathy in the axillary.  3 to 4 cm lymph node noted in the right inguinal region. LUNGS: Decreased breath sounds auscultation bilaterally. No wheeze or crackles HEART/CVS: regular rate & rhythm and no murmurs; bilateral lower extremity swelling right more than left. ABDOMEN:abdomen soft, non-tender and normal bowel sounds. No hepatomegaly or splenomegaly.  Musculoskeletal:no cyanosis of digits and no clubbing  PSYCH: alert & oriented x 3 with fluent speech NEURO: no focal motor/sensory deficits SKIN:  no rashes or significant lesions    LABORATORY DATA:  I have reviewed the data as listed    Component Value Date/Time   NA 140 07/29/2017 0943   NA 138 06/02/2014 0910   K 4.7 07/29/2017 0943   K 3.7 06/02/2014 0910   CL 103 07/29/2017 0943   CL 107 06/02/2014 0910   CO2 27 07/29/2017 0943   CO2 26 06/02/2014 0910   GLUCOSE 96 07/29/2017 0943   GLUCOSE 106 (H) 06/02/2014 0910   BUN 23 07/29/2017 0943   BUN 17 06/02/2014 0910   CREATININE 0.97 07/29/2017 0943   CREATININE 1.00 06/02/2014 0910   CALCIUM 8.9 07/29/2017 0943   CALCIUM 8.6 (L) 06/02/2014 0910   PROT 7.3 07/29/2017 0943   PROT 6.3 (L) 06/02/2014 0910   ALBUMIN 3.7 07/29/2017 0943   ALBUMIN 3.4 (L) 06/02/2014 0910   AST 23 07/29/2017 0943   AST 23 06/02/2014 0910   ALT 10 07/29/2017 0943   ALT 10 (L) 06/02/2014 0910   ALKPHOS 69 07/29/2017 0943   ALKPHOS 46 06/02/2014 0910   BILITOT 0.3 07/29/2017 0943   BILITOT 0.5 06/02/2014 0910   GFRNONAA 51 (L) 07/29/2017 0943   GFRNONAA 52 (L) 06/02/2014 0910   GFRAA 59 (L) 07/29/2017 0943   GFRAA >60 06/02/2014 0910    No results found for: SPEP, UPEP  Lab Results  Component Value Date   WBC 3.6 07/29/2017   NEUTROABS 2.1 07/29/2017   HGB 11.8 (L) 07/29/2017   HCT 36.2 07/29/2017   MCV 91.5 07/29/2017   PLT 393  07/29/2017      Chemistry      Component Value Date/Time   NA 140 07/29/2017 0943   NA  138 06/02/2014 0910   K 4.7 07/29/2017 0943   K 3.7 06/02/2014 0910   CL 103 07/29/2017 0943   CL 107 06/02/2014 0910   CO2 27 07/29/2017 0943   CO2 26 06/02/2014 0910   BUN 23 07/29/2017 0943   BUN 17 06/02/2014 0910   CREATININE 0.97 07/29/2017 0943   CREATININE 1.00 06/02/2014 0910      Component Value Date/Time   CALCIUM 8.9 07/29/2017 0943   CALCIUM 8.6 (L) 06/02/2014 0910   ALKPHOS 69 07/29/2017 0943   ALKPHOS 46 06/02/2014 0910   AST 23 07/29/2017 0943   AST 23 06/02/2014 0910   ALT 10 07/29/2017 0943   ALT 10 (L) 06/02/2014 0910   BILITOT 0.3 07/29/2017 0943   BILITOT 0.5 06/02/2014 0910       RADIOGRAPHIC STUDIES: I have personally reviewed the radiological images as listed and agreed with the findings in the report. No results found.   ASSESSMENT & PLAN:  Malignant neoplasm of right fallopian tube [facility]) # Metastatic fallopian tube/platinum sensitive high-grade serous-cancer; with right inguinal lymph node metastases.  Worsened.  Status post radiation finished middle of June.  We will repeat a follow-up CT scan in approximately 2 to 3 months.  This has been ordered.  #Right lower extremity lymphedema-second malignancy status post radiation; also proceed with lymphedema physical therapy.  #Acute right lower lung PE on Eliquis; stable  #History of TIA-no further episodes.  # lympahdedemea- PT ; # follow up in 3 months; CT /labs- ca/cmp/ca-125. CT prior.    Orders Placed This Encounter  Procedures  . CT Abdomen Pelvis W Contrast    Standing Status:   Future    Standing Expiration Date:   07/29/2018    Order Specific Question:   If indicated for the ordered procedure, I authorize the administration of contrast media per Radiology protocol    Answer:   Yes    Order Specific Question:   Preferred imaging location?    Answer:   [facility]    Order Specific  Question:   Is Oral Contrast requested for this exam?    Answer:   Yes, Per Radiology protocol    Order Specific Question:   Radiology Contrast Protocol - do NOT remove file path    Answer:   [email or web address]  . CBC with Differential    Standing Status:   Future    Standing Expiration Date:   07/30/2018  . Comprehensive metabolic panel    Standing Status:   Future    Standing Expiration Date:   07/30/2018  . CA 125    Standing Status:   Future    Standing Expiration Date:   07/30/2018   All questions were answered. The patient knows to call the clinic with any problems, questions or concerns.      [name], MD 08/03/2017 2:32 PM

## 2017-07-29 NOTE — Assessment & Plan Note (Addendum)
#   Metastatic fallopian tube/platinum sensitive high-grade serous-cancer; with right inguinal lymph node metastases.  Worsened.  Status post radiation finished middle of June.  We will repeat a follow-up CT scan in approximately 2 to 3 months.  This has been ordered.  #Right lower extremity lymphedema-second malignancy status post radiation; also proceed with lymphedema physical therapy.  #Acute right lower lung PE on Eliquis; stable  #History of TIA-no further episodes.  # lympahdedemea- PT ; # follow up in 3 months; CT /labs- ca/cmp/ca-125. CT prior.

## 2017-07-30 LAB — CA 125: Cancer Antigen (CA) 125: 62.8 U/mL — ABNORMAL HIGH (ref 0.0–38.1)

## 2017-07-31 ENCOUNTER — Ambulatory Visit: Payer: Medicare HMO | Admitting: Occupational Therapy

## 2017-07-31 DIAGNOSIS — I890 Lymphedema, not elsewhere classified: Secondary | ICD-10-CM

## 2017-07-31 NOTE — Therapy (Signed)
[place] MAIN [facility] SERVICES [address] [place], [place], [address] Phone: [phone]   Fax:  [phone]  Occupational Therapy Treatment  Patient Details  Name: Nicole Clayton MRN: [id number] Date of Birth: 03-11-1928 No data recorded  Encounter Date: 07/29/2017    Past Medical History:  Diagnosis Date  . Bladder infection, chronic 10/19/2011  . Cancer (HCC)    Ovarian   . 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 Claiborne Rigg and Delorise Shiner Choksi Overview:  Drs. Claiborne Rigg and [employer]   . Concussion   . GERD (gastroesophageal reflux disease)   . MI (mitral incompetence) 10/27/2014   Overview:  MODERATE   . Mitral valve prolapse   . OAB (overactive bladder)   . Ovarian cancer ([place])   . Pulmonary embolism ([place])   . Tachycardia     Past Surgical History:  Procedure Laterality Date  . ABDOMINAL HYSTERECTOMY    . CHOLECYSTECTOMY    . LAPAROSCOPIC BILATERAL SALPINGO OOPHERECTOMY  2011  . ovarian cancer surgery  2011    There were no vitals filed for this visit.  Subjective Assessment - 07/31/17 1656    Subjective   [name] presents for OT visiit 2/36 for complete decongestive therapy to RLE. Pt is accompanied by her daughter-in-law, Arbie Cookey, who is here to assis Pt with compression wrapping and LE self care.    Currently in Pain?  Yes    Pain Score  -- generalized fatigue- not rated numerically- "I fweel too tired to lift a finger today."          LYMPHEDEMA/ONCOLOGY QUESTIONNAIRE - 07/31/17 1701      Lymphedema Assessments   Lymphedema Assessments  Lower extremities      Right Lower Extremity Lymphedema   Other  ..................................................................................0    Other  ............................................................................Marland Kitchen              OT Treatments/Exercises (OP) - 07/31/17 0001      ADLs   ADL Education  Given  Yes      Manual Therapy   Manual Therapy  Edema management;Compression Bandaging    Edema Management  Completed baseline comparative limb volumetrics    Compression Bandaging  Short stretch compression wraps applied from foot to below knee only today. Gradient established by applying wraps with more layers concentrated distally and fewer proximally. Rosidal foam iimproves rebounding of skin to improve musckle pump action and interstitial pressure.                            Patient will benefit from skilled therapeutic intervention in order to improve the following deficits and impairments:     Visit Diagnosis: Lymphedema, not elsewhere classified    Problem List Patient Active Problem List   Diagnosis Date Noted  . Pain and swelling of right lower leg 10/11/2016  . Anemia 09/11/2015  . UTI (urinary tract infection) due to Enterococcus 07/19/2015  . 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 Neuropathy 02/10/2015  . Midline cystocele 12/15/2014  . Vaginal atrophy 12/15/2014  . Status post hysterectomy with oophorectomy 12/15/2014  . Celiac disease 11/07/2014  . MI (mitral incompetence) 10/27/2014  . Infection of urinary tract 08/12/2014  . CD (celiac disease) 07/12/2014  . Mixed hyperlipidemia 04/12/2014  . Paroxysmal supraventricular tachycardia ([place]) 04/12/2014  . Thrombocythemia ([place]) 09/26/2013  . Vitamin D deficiency 09/26/2013  . Frequent UTI 09/26/2013  . Chest  pain 07/28/2013  . Edema 07/06/2013  . Other symptoms involving urinary system 07/07/2012  . Female genuine stress incontinence 02/10/2012  . Bladder infection, chronic 10/19/2011  . Urge incontinence 10/19/2011  . Malignant neoplasm of right fallopian tube [facility]) 10/05/2009    Andrey Spearman, MS, OTR/L, [facility] 07/31/17 5:15 PM  [place] MAIN [facility] SERVICES [address] [place], [place], [address] Phone: [phone]   Fax:  [phone]  Name:  JEVAEH SHAMS MRN: [id number] Date of Birth: 1928-12-30

## 2017-07-31 NOTE — Therapy (Signed)
[place] MAIN [facility] SERVICES [address] [place], [place], [address] Phone: [phone]   Fax:  [phone]  Occupational Therapy Treatment  Patient Details  Name: Nicole Clayton MRN: [id number] Date of Birth: 1928-03-27 No data recorded  Encounter Date: 07/31/2017  OT End of Session - 07/31/17 1717    Visit Number  2    Number of Visits  36    Date for OT Re-Evaluation  10/26/17    OT Start Time  0305    OT Stop Time  0420    OT Time Calculation (min)  75 min       Past Medical History:  Diagnosis Date  . Bladder infection, chronic 10/19/2011  . Cancer (HCC)    Ovarian   . 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 Claiborne Rigg and Delorise Shiner Choksi Overview:  Drs. Claiborne Rigg and [employer]   . Concussion   . GERD (gastroesophageal reflux disease)   . MI (mitral incompetence) 10/27/2014   Overview:  MODERATE   . Mitral valve prolapse   . OAB (overactive bladder)   . Ovarian cancer ([place])   . Pulmonary embolism ([place])   . Tachycardia     Past Surgical History:  Procedure Laterality Date  . ABDOMINAL HYSTERECTOMY    . CHOLECYSTECTOMY    . LAPAROSCOPIC BILATERAL SALPINGO OOPHERECTOMY  2011  . ovarian cancer surgery  2011    There were no vitals filed for this visit.  Subjective Assessment - 07/31/17 1656    Subjective   [name] presents for OT visiit 2/36 for complete decongestive therapy to RLE. Pt is accompanied by her daughter-in-law, Arbie Cookey, who is here to assis Pt with compression wrapping and LE self care.    Currently in Pain?  Yes    Pain Score  -- generalized fatigue- not rated numerically- "I fweel too tired to lift a finger today."          LYMPHEDEMA/ONCOLOGY QUESTIONNAIRE - 07/31/17 1721      Right Lower Extremity Lymphedema   Other  RLE leg segment (A-D)  =volume = 4313.1 ml. RLE thigh segment (E-G) = 5450.89 ml. RLE full leg volume (A-G) measures 9763.99 ml    Other  imb volumetric differential below the knee measures 33.49%, R>L. LVD for thigh segment measures 18.85%, R>L. Full legg LVD measures 25.37%, R>L.       Left Lower Extremity Lymphedema   Other  LLE leg segment (A-D) limb volume = 2868.63 ml. LLE thigh segment (E-G) = 4417.85 ml. LLE full leg volume (A-G) measures 7286.48 ml              OT Treatments/Exercises (OP) - 07/31/17 1721      ADLs   ADL Education Given  Yes      Manual Therapy   Manual Therapy  Edema management;Compression Bandaging    Edema Management  Completed baseline comparative limb volumetrics    Compression Bandaging  Short stretch compression wraps applied from foot to below knee only today. Gradient established by applying wraps with more layers concentrated distally and fewer proximally. Rosidal foam iimproves rebounding of skin to improve musckle pump action and interstitial pressure.              OT Education - 07/31/17 1716    Education Details  Provided Pt/caregiver skilled education regarding lymphatic structure and function, various lymphedema etiologies, obnset patterns and progression,  impact of obesity on  lymphatic system function, and  discussed Complete Decongestive Therapy for LE care, including 4 components of Intensive and Self Management Phases.  Discussed   Importance of daily daily LE self care to retain clinical gains and limit progression.  Discussed lymphedema precautions, cellulitis risk,  Provided printed Lymphedema Workbook for reference.    Person(s) Educated  Patient;Caregiver(s)    Methods  Explanation;Demonstration;Tactile cues;Verbal cues;Handout    Comprehension  Verbalized understanding;Returned demonstration;Need further instruction;Verbal cues required;Tactile cues required                 Plan - 07/31/17 1717    Clinical Impression Statement  completed initial, baseline comparative limb volumetrics bilaterally. Limb volumetric differential below the knee  reveals sever assymetry in limb volumes. Limb volume differentiasl (LVD) measures 33.49%, R>L. LVD for thigh segment measures 18.85%, R>L. Full legg LVD measures 25.37%, R>L.  Applied   short stretch compression wraps ingradient configuration from foot to below only today since Pt is not returning  to OT until next week. Instead of thigh length wraps, knee length wraps should be less cumbersome to walk in and wil push less fluid return to the heart. Pt tolerated all aspects of OT well today and daughter in law was great support to her. Cont as per OPC next week. Commence thigh length wraps as tolerated.    Occupational performance deficits (Please refer to evaluation for details):  ADL's;IADL's;Work;Leisure;Social Participation;Other body image    Rehab Potential  Good       Patient will benefit from skilled therapeutic intervention in order to improve the following deficits and impairments:  Increased edema, Decreased skin integrity, Decreased knowledge of use of DME, Impaired flexibility, Pain, Decreased mobility, Impaired sensation, Decreased activity tolerance, Decreased range of motion, Decreased balance, Decreased knowledge of precautions, Difficulty walking  Visit Diagnosis: Lymphedema, not elsewhere classified    Problem List Patient Active Problem List   Diagnosis Date Noted  . Pain and swelling of right lower leg 10/11/2016  . Anemia 09/11/2015  . UTI (urinary tract infection) due to Enterococcus 07/19/2015  . 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 Neuropathy 02/10/2015  . Midline cystocele 12/15/2014  . Vaginal atrophy 12/15/2014  . Status post hysterectomy with oophorectomy 12/15/2014  . Celiac disease 11/07/2014  . MI (mitral incompetence) 10/27/2014  . Infection of urinary tract 08/12/2014  . CD (celiac disease) 07/12/2014  . Mixed hyperlipidemia 04/12/2014  . Paroxysmal supraventricular tachycardia ([place]) 04/12/2014  . Thrombocythemia ([place]) 09/26/2013  . Vitamin D deficiency  09/26/2013  . Frequent UTI 09/26/2013  . Chest pain 07/28/2013  . Edema 07/06/2013  . Other symptoms involving urinary system 07/07/2012  . Female genuine stress incontinence 02/10/2012  . Bladder infection, chronic 10/19/2011  . Urge incontinence 10/19/2011  . Malignant neoplasm of right fallopian tube [facility]) 10/05/2009    Andrey Spearman, MS, OTR/L, [facility] 07/31/17 5:29 PM  [place] MAIN [facility] SERVICES [address] [place], [place], [address] Phone: [phone]   Fax:  [phone]  Name: SHAVONE NEVERS MRN: [id number] Date of Birth: 1928-04-25

## 2017-08-04 ENCOUNTER — Ambulatory Visit: Payer: Medicare HMO | Attending: Internal Medicine | Admitting: Occupational Therapy

## 2017-08-04 ENCOUNTER — Encounter: Payer: Self-pay | Admitting: Occupational Therapy

## 2017-08-04 DIAGNOSIS — I890 Lymphedema, not elsewhere classified: Secondary | ICD-10-CM | POA: Diagnosis not present

## 2017-08-04 NOTE — Therapy (Signed)
[place] MAIN [facility] SERVICES [address] [place], [place], [address] Phone: [phone]   Fax:  [phone]  Occupational Therapy Treatment  Patient Details  Name: Nicole Clayton MRN: [id number] Date of Birth: 08/23/1928 Referring Provider: [name], MD   Encounter Date: 08/04/2017  OT End of Session - 08/04/17 1707    Visit Number  3    Number of Visits  36    Date for OT Re-Evaluation  10/26/17    OT Start Time  0200    OT Stop Time  0310    OT Time Calculation (min)  70 min       Past Medical History:  Diagnosis Date  . Bladder infection, chronic 10/19/2011  . Cancer (HCC)    Ovarian   . 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 Claiborne Rigg and Delorise Shiner Choksi Overview:  Drs. Claiborne Rigg and [employer]   . Concussion   . GERD (gastroesophageal reflux disease)   . MI (mitral incompetence) 10/27/2014   Overview:  MODERATE   . Mitral valve prolapse   . OAB (overactive bladder)   . Ovarian cancer ([place])   . Pulmonary embolism ([place])   . Tachycardia     Past Surgical History:  Procedure Laterality Date  . ABDOMINAL HYSTERECTOMY    . CHOLECYSTECTOMY    . LAPAROSCOPIC BILATERAL SALPINGO OOPHERECTOMY  2011  . ovarian cancer surgery  2011    There were no vitals filed for this visit.  Subjective Assessment - 08/04/17 1702    Subjective   [name] presents for OT visiit 3/36 for complete decongestive therapy to RLE. Pt is accompanied by her daughter-in-law, Arbie Cookey, who is here to assis Pt with compression wrapping and LE self care. Pt reprots she had no difficulty tolerating compression wraps during visit interval. Pt and CG pleased with response to compression wraps when they removed them  after last visit.    Currently in Pain?  Yes pani unchanged,. Not numerically rated today    Pain Location  Leg    Pain Orientation  Right    Pain Descriptors / Indicators   Heaviness;Numbness;Pins and needles;Discomfort;Tightness;Tingling;Tiring;Other (Comment) fullness    Pain Type  Neuropathic pain;Chronic pain         [facility] OT Assessment - 08/04/17 0001      Assessment   Medical Diagnosis  Mild, Stage II, RLE Lymphedema 2/2 recurrent ovarian cancer    Referring Provider  [name], MD    Onset Date/Surgical Date  -- ~2013    Hand Dominance  Right    Prior Therapy  no prior Complete Decongestive Therapy (CDT); no prior compression garments      Precautions   Precautions  Other (comment)    Precaution Comments  LE infection precautions, cancer surveilance, hx port associated DVT 2015 (on Xeralto)  - OMIT NECK MLD SEQUENCE,       Restrictions   Weight Bearing Restrictions  No      Balance Screen   Has the patient fallen in the past 6 months  No    Has the patient had a decrease in activity level because of a fear of falling?   Yes    Is the patient reluctant to leave their home because of a fear of falling?   No      Prior Function   Level of Independence  Independent;Independent with basic ADLs;Independent with household mobility without device;Independent  with community mobility without device;Independent with homemaking with ambulation;Independent with gait;Independent with transfers    Vocation  Retired    Leisure  family time, shopping for  shoes and clothing      IADL   Prior Level of [place] for transportation;Needs to be accompanied on any shopping trip leg swelling and pain limit activity  tolerance    Prior Level of Function Light Housekeeping  I    Light Housekeeping  [place] small items, rinses stockings, etc.;Performs light daily tasks such as dishwashing, bed making;Performs light daily tasks but cannot maintain acceptable level of cleanliness;Needs help with all home maintenance tasks leg swelling and pain limit activity  tolerance    Prior Level of Function Meal Prep  I    Meal Prep   Able to complete simple warm meal prep;Able to complete simple cold meal and snack prep leg swelling and pain limit activvity tolerance    Prior Level of Function Community Mobility  I    Chief Operating Officer on family or friends for transportation;Travel limited to taxi or vehicle with assistance of another    Prior Level of Function Medication Managment  I    Medication Management  Is responsible for taking medication in correct dosages at correct time reliant on family members for transportation to pharmacy    Prior Level of Function Financial Management  I    Psychiatrist financial matters independently (budgets, writes checks, pays rent, bills goes to bank), collects and keeps track of income      Mobility   Mobility Status  Needs assist    Mobility Status Comments  transport wc to clinic, has RW at home      Vision - History   Baseline Vision  Wears glasses all the time      Activity Tolerance   Activity Tolerance  Endurance does not limit participation in activity Leg pain and swelling limits activity tolerance       Cognition   Overall Cognitive Status  Within Functional Limits for tasks assessed      Sensation   Additional Comments  periferal neuropathy B feet w/ impaired sensation contributes to decreased balance      Coordination   Gross Motor Movements are Fluid and Coordinated  Yes    Fine Motor Movements are Fluid and Coordinated  Yes      ROM / Strength   AROM / PROM / Strength  AROM;Strength      AROM   Overall AROM Comments  leg swelling, tissue induration  and pain limit  AROM at R toes and foot, knee and hiip.      Strength   Overall Strength  Within functional limits for tasks performed      LYMPHEDEMA/ONCOLOGY QUESTIONNAIRE - 08/04/17 1604      Type   Cancer Type  ovarian      Surgeries   Number Lymph Nodes Removed  -- unknown- see H & P for complex Oncology Hx      Date Lymphedema/Swelling Started   Date  -- 2013       Treatment   Past Chemotherapy Treatment  Yes    Past Radiation Treatment  Yes      What other symptoms do you have   Are you Having Heaviness or Tightness  Yes    Are you having pitting edema  Yes    Body Site  RLE    Is it  Hard or Difficult finding clothes that fit  Yes    Do you have infections  No    Is there Decreased scar mobility  No    [place]mer Sign  Yes      Lymphedema Stage   Stage  STAGE 2 SPONTANEOUSLY IRREVERSIBLE      Lymphedema Assessments   Lymphedema Assessments  Lower extremities              OT Treatments/Exercises (OP) - 08/04/17 1705      Bed Mobility   Bed Mobility  -- Mod indepndent with rails      ADLs   ADL Education Given  Yes      Manual Therapy   Manual Therapy  Edema management    Compression Bandaging  Short stretch compression wraps applied from foot to below knee only today. Gradient established by applying wraps with more layers concentrated distally and fewer proximally. Rosidal foam iimproves rebounding of skin to improve musckle pump action and interstitial pressure.              OT Education - 08/04/17 1706    Education Details  Cont Pt and family edu for comprression wrapping with short sretch wraps.    Person(s) Educated  Patient;Caregiver(s)    Methods  Explanation;Demonstration;Tactile cues;Verbal cues;Handout    Comprehension  Verbalized understanding;Returned demonstration;Need further instruction;Verbal cues required;Tactile cues required          OT Long Term Goals - 07/29/17 1634      OT LONG TERM GOAL #1   Title  Pt independent w/ lymphedema precautions/prevention principals and using printed reference to limit LE progression and infection risk.    Baseline  Max A    Time  2    Period  Weeks    Status  New    Target Date  -- 1oth OT visit      OT LONG TERM GOAL #2   Title  Lymphedema (LE) management/ self-care: Pt able to apply thigh length gradient compression wraps between OT visits with max A from  caregiver using correct gradient techniques within 2 weeks to achieve optimal limb volume reduction during Intensive Phase of CDT and optimal self-management over time.    Baseline  Max A    Time  2    Period  Weeks    Status  New    Target Date  -- 10th OT visit      OT LONG TERM GOAL #3   Title  Lymphedema (LE) management/ self-care:  Pt to achieve at least 10%  limb volume RLE reduction during Intensive Phase CDT to decrease infection and falls risk, to limit LE progression and to improve functional mobility and ambulation essential for optimal ADLs performance.    Baseline  Max A    Time  12    Period  Weeks    Status  New    Target Date  10/18/17      OT LONG TERM GOAL #4   Title  Pt to tolerate RLE compression garments and HOS devices and be able to don and doff them with modified independence using assistive devices and extra time PRN to ensure optimal LT management over time.     Baseline  max A    Time  12    Period  Weeks    Status  New    Target Date  10/18/17      OT LONG TERM GOAL #5   Title  Lymphedema (LE) Pain: Pt  to report 25% reduction in RLE discomfort/ pain described as  "heaviness", "fullness", and tightness" by DC to improve ADLs performance, to increase leisure pursuits,  and to increase social participation and role performace.      Baseline  Max A    Time  12    Period  Weeks    Status  New    Target Date  10/28/17      Long Term Additional Goals   Additional Long Term Goals  Yes      OT LONG TERM GOAL #6   Title  Lymphedema (LE) management/ self-care:  During Management Phase CDT Pt to sustain reduced limb volumes achieved during Intensive Phase CDT at follow up visits during Management Phase CDT within 3% using LE self-care home program components as directed ( simple self MLD, skin care, ther ex and daily/ nightly compression garments/ devices) .    Baseline  Max A    Time  6    Period  Months    Status  New    Target Date  01/26/18             Plan - 08/04/17 1708    Clinical Impression Statement  Emphasis of visit today on LE self care training for compression wrap application using gradient techniques. Unable to apply wraps from toes to groin today aas Pt forgot to wear stretchy pants needed to cover wraps. By end of session daughter in law was able to apply knee length gradient wrap with min A. We were able to fit tennis shoe over wraps vs using cast shoe to limit impact on balance. Cont as per POC.    Occupational performance deficits (Please refer to evaluation for details):  ADL's;IADL's;Work;Leisure;Social Participation;Other body image    Rehab Potential  Good       Patient will benefit from skilled therapeutic intervention in order to improve the following deficits and impairments:  Increased edema, Decreased skin integrity, Decreased knowledge of use of DME, Impaired flexibility, Pain, Decreased mobility, Impaired sensation, Decreased activity tolerance, Decreased range of motion, Decreased balance, Decreased knowledge of precautions, Difficulty walking  Visit Diagnosis: Lymphedema, not elsewhere classified    Problem List Patient Active Problem List   Diagnosis Date Noted  . Pain and swelling of right lower leg 10/11/2016  . Anemia 09/11/2015  . UTI (urinary tract infection) due to Enterococcus 07/19/2015  . 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 Neuropathy 02/10/2015  . Midline cystocele 12/15/2014  . Vaginal atrophy 12/15/2014  . Status post hysterectomy with oophorectomy 12/15/2014  . Celiac disease 11/07/2014  . MI (mitral incompetence) 10/27/2014  . Infection of urinary tract 08/12/2014  . CD (celiac disease) 07/12/2014  . Mixed hyperlipidemia 04/12/2014  . Paroxysmal supraventricular tachycardia ([place]) 04/12/2014  . Thrombocythemia ([place]) 09/26/2013  . Vitamin D deficiency 09/26/2013  . Frequent UTI 09/26/2013  . Chest pain 07/28/2013  . Edema 07/06/2013  . Other symptoms involving urinary system  07/07/2012  . Female genuine stress incontinence 02/10/2012  . Bladder infection, chronic 10/19/2011  . Urge incontinence 10/19/2011  . Malignant neoplasm of right fallopian tube [facility]) 10/05/2009    Andrey Spearman, MS, OTR/L, [facility] 08/04/17 5:12 PM  [place] MAIN [facility] SERVICES [address] [place], [place], [address] Phone: [phone]   Fax:  [phone]  Name: Nicole Clayton MRN: [id number] Date of Birth: October 31, 1928

## 2017-08-04 NOTE — Addendum Note (Signed)
Addended by: Ansel Bong on: 08/04/2017 05:01 PM   Modules accepted: Orders

## 2017-08-04 NOTE — Therapy (Signed)
[place] MAIN [facility] SERVICES [address] [place], [place], [address] Phone: [phone]   Fax:  [phone]  Occupational Therapy Evaluation  Patient Details  Name: Nicole Clayton MRN: [id number] Date of Birth: 1928/03/25 Referring Provider: [name], MD   Encounter Date: 07/29/2017    Past Medical History:  Diagnosis Date  . Bladder infection, chronic 10/19/2011  . Cancer (HCC)    Ovarian   . 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 Claiborne Rigg and Delorise Shiner Choksi Overview:  Drs. Claiborne Rigg and [employer]   . Concussion   . GERD (gastroesophageal reflux disease)   . MI (mitral incompetence) 10/27/2014   Overview:  MODERATE   . Mitral valve prolapse   . OAB (overactive bladder)   . Ovarian cancer ([place])   . Pulmonary embolism ([place])   . Tachycardia     Past Surgical History:  Procedure Laterality Date  . ABDOMINAL HYSTERECTOMY    . CHOLECYSTECTOMY    . LAPAROSCOPIC BILATERAL SALPINGO OOPHERECTOMY  2011  . ovarian cancer surgery  2011    There were no vitals filed for this visit.     [facility] OT Assessment - 08/04/17 0001      Assessment   Medical Diagnosis  Mild, Stage II, RLE Lymphedema 2/2 recurrent ovarian cancer    Referring Provider  [name], MD    Onset Date/Surgical Date  -- ~2013    Hand Dominance  Right    Prior Therapy  no prior Complete Decongestive Therapy (CDT); no prior compression garments      Precautions   Precautions  Other (comment)    Precaution Comments  LE infection precautions, cancer surveilance, hx port associated DVT 2015 (on Xeralto)  - OMIT NECK MLD SEQUENCE,       Restrictions   Weight Bearing Restrictions  No      Balance Screen   Has the patient fallen in the past 6 months  No    Has the patient had a decrease in activity level because of a fear of falling?   Yes    Is the patient reluctant to leave their home because of  a fear of falling?   No      Prior Function   Level of Independence  Independent;Independent with basic ADLs;Independent with household mobility without device;Independent with community mobility without device;Independent with homemaking with ambulation;Independent with gait;Independent with transfers    Vocation  Retired    Leisure  family time, shopping for  shoes and clothing      IADL   Prior Level of [place] for transportation;Needs to be accompanied on any shopping trip leg swelling and pain limit activity  tolerance    Prior Level of Function Light Housekeeping  I    Light Housekeeping  [place] small items, rinses stockings, etc.;Performs light daily tasks such as dishwashing, bed making;Performs light daily tasks but cannot maintain acceptable level of cleanliness;Needs help with all home maintenance tasks leg swelling and pain limit activity  tolerance    Prior Level of Function Meal Prep  I    Meal Prep  Able to complete simple warm meal prep;Able to complete simple cold meal and snack prep leg swelling and pain limit activvity tolerance    Prior Level of Function Community Mobility  I    Community Mobility  Relies on family or friends for transportation;Travel limited  to taxi or vehicle with assistance of another    Prior Level of Function Medication Managment  I    Medication Management  Is responsible for taking medication in correct dosages at correct time reliant on family members for transportation to pharmacy    Prior Level of Function Financial Management  I    Financial Management  Manages financial matters independently (budgets, writes checks, pays rent, bills goes to bank), collects and keeps track of income      Mobility   Mobility Status  Needs assist    Mobility Status Comments  transport wc to clinic, has RW at home      [place]  Wears glasses all the time      Activity Tolerance   Activity  Tolerance  Endurance does not limit participation in activity Leg pain and swelling limits activity tolerance       Cognition   Overall Cognitive Status  Within Functional Limits for tasks assessed      Sensation   Additional Comments  periferal neuropathy B feet w/ impaired sensation contributes to decreased balance      Coordination   Gross Motor Movements are Fluid and Coordinated  Yes    Fine Motor Movements are Fluid and Coordinated  Yes      ROM / Strength   AROM / PROM / Strength  AROM;Strength      AROM   Overall AROM Comments  leg swelling, tissue induration  and pain limit  AROM at R toes and foot, knee and hiip.      Strength   Overall Strength  Within functional limits for tasks performed      Mild Stage I I, RLE lymphedema (LE)  2/2 ovarian cancer and Rx Skin Description Hyper-Keratosis Peau' de Orange Shiny Tight Fibrotic Fatty Doughy Indurated    x x X Thickened  brawny edema    x   Hydration Dry Flaky Erythema Other   x   Mildly dry   Color Redness Present Pallor Blanching Hemosiderin Staining Other   x  x      Odor Malodorous Yeast  Absent      x   Temperature Warm Cool Normal    x    Pitting Edema   1+ 2+ 3+ 4+ Non-pitting       X    Girth Symmetrical Asymmetrical Other Distribution    R>L Foot to groin    Stemmer Sign Positive Negative     RLE only, Strong +    Lymphorrea History Of:  Present Absent   x     Wounds History Of Present Absent Venous Arterial Pressure Size   denies  x          Signs of Infection Redness Warmth Pain Acute Swelling Lymphorrhea [place]               Hx of  weeping     Scars Adhesions Hypersensitivity        Sensation Light Touch Deep pressure Hypersensitivty   Present Impaired Present Impaired Present Impaired    X [place] related (?) neuropathy        x  Nails WNL Fungus Present Other   x     Hair Growth Symmetrical Asymmetrical   unremarkable    Skin Creases Base of toes\   Deep RLE  only Ankle Base of Finger Medial Thigh         + Stemmer sign  LYMPHEDEMA/ONCOLOGY QUESTIONNAIRE - 08/04/17 1604      Type   Cancer Type  ovarian      Surgeries   Number Lymph Nodes Removed  -- unknown- see H & P for complex Oncology Hx      Date Lymphedema/Swelling Started   Date  -- 2013      Treatment   Past Chemotherapy Treatment  Yes    Past Radiation Treatment  Yes      What other symptoms do you have   Are you Having Heaviness or Tightness  Yes    Are you having pitting edema  Yes    Body Site  RLE    Is it Hard or Difficult finding clothes that fit  Yes    Do you have infections  No    Is there Decreased scar mobility  No    Stemmer Sign  Yes      Lymphedema Stage   Stage  STAGE 2 SPONTANEOUSLY IRREVERSIBLE      Lymphedema Assessments   Lymphedema Assessments  Lower extremities             OT Treatments/Exercises (OP) - 08/04/17 0001      Bed Mobility   Bed Mobility  -- Mod indepndent with rails      Transfers   Transfers  Sit to Stand;Stand to Sit    Sit to Stand  6: Modified independent (Device/Increase time);With armrests    Stand to Sit  6: Modified independent (Device/Increase time);With armrests;Uncontrolled descent    Comments  Leg swellin, pain and i AROM limitations limit car transfers      ADLs   LB Dressing  limited by swelling and body assymetry    Bathing  difficulty bending to reach  distal legs and feet to bathe, perform skin and nail care. difficulty  firtting LB clothing and preferred street shoes due to leg swelling    Functional Mobility  impaired sensation 2/2 leg pain and swelling    Cooking  family provides meals    Home Maintenance  requires Max A    Driving  not driving    Work  leg swelling and pain limiots ability to perform productive activities and tasks requiring standing, walking and/ or extended sitting    Leisure  leg pain and swelling limits participation in family activities and shopping    ADL Education  Given  Yes      Manual Therapy   Manual Therapy  Edema management                 OT Long Term Goals - 07/29/17 1634      OT LONG TERM GOAL #1   Title  Pt independent w/ lymphedema precautions/prevention principals and using printed reference to limit LE progression and infection risk.    Baseline  Max A    Time  2    Period  Weeks    Status  New    Target Date  -- 1oth OT visit      OT LONG TERM GOAL #2   Title  Lymphedema (LE) management/ self-care: Pt able to apply thigh length gradient compression wraps between OT visits with max A from caregiver using correct gradient techniques within 2 weeks to achieve optimal limb volume reduction during Intensive Phase of CDT and optimal self-management over time.    Baseline  Max A    Time  2    Period  Weeks    Status  New  Target Date  -- 10th OT visit      OT LONG TERM GOAL #3   Title  Lymphedema (LE) management/ self-care:  Pt to achieve at least 10%  limb volume RLE reduction during Intensive Phase CDT to decrease infection and falls risk, to limit LE progression and to improve functional mobility and ambulation essential for optimal ADLs performance.    Baseline  Max A    Time  12    Period  Weeks    Status  New    Target Date  10/18/17      OT LONG TERM GOAL #4   Title  Pt to tolerate RLE compression garments and HOS devices and be able to don and doff them with modified independence using assistive devices and extra time PRN to ensure optimal LT management over time.     Baseline  max A    Time  12    Period  Weeks    Status  New    Target Date  10/18/17      OT LONG TERM GOAL #5   Title  Lymphedema (LE) Pain: Pt to report 25% reduction in RLE discomfort/ pain described as  "heaviness", "fullness", and tightness" by DC to improve ADLs performance, to increase leisure pursuits,  and to increase social participation and role performace.      Baseline  Max A    Time  12    Period  Weeks    Status  New     Target Date  10/28/17      Long Term Additional Goals   Additional Long Term Goals  Yes      OT LONG TERM GOAL #6   Title  Lymphedema (LE) management/ self-care:  During Management Phase CDT Pt to sustain reduced limb volumes achieved during Intensive Phase CDT at follow up visits during Management Phase CDT within 3% using LE self-care home program components as directed ( simple self MLD, skin care, ther ex and daily/ nightly compression garments/ devices) .    Baseline  Max A    Time  6    Period  Months    Status  New    Target Date  01/26/18              Patient will benefit from skilled therapeutic intervention in order to improve the following deficits and impairments:  Decreased knowledge of use of DME, Decreased skin integrity, Increased edema, Impaired flexibility, Pain, Decreased mobility, Impaired sensation, Decreased activity tolerance, Decreased range of motion, Decreased balance, Decreased knowledge of precautions, Difficulty walking  Visit Diagnosis: Lymphedema, not elsewhere classified - Plan: Ot plan of care cert/re-cert    Problem List Patient Active Problem List   Diagnosis Date Noted  . Pain and swelling of right lower leg 10/11/2016  . Anemia 09/11/2015  . UTI (urinary tract infection) due to Enterococcus 07/19/2015  . 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 Neuropathy 02/10/2015  . Midline cystocele 12/15/2014  . Vaginal atrophy 12/15/2014  . Status post hysterectomy with oophorectomy 12/15/2014  . Celiac disease 11/07/2014  . MI (mitral incompetence) 10/27/2014  . Infection of urinary tract 08/12/2014  . CD (celiac disease) 07/12/2014  . Mixed hyperlipidemia 04/12/2014  . Paroxysmal supraventricular tachycardia ([place]) 04/12/2014  . Thrombocythemia ([place]) 09/26/2013  . Vitamin D deficiency 09/26/2013  . Frequent UTI 09/26/2013  . Chest pain 07/28/2013  . Edema 07/06/2013  . Other symptoms involving urinary system 07/07/2012  . Female genuine stress  incontinence 02/10/2012  .  Bladder infection, chronic 10/19/2011  . Urge incontinence 10/19/2011  . 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 [place], [place], [address] Phone: [phone]   Fax:  [phone]  Name: Nicole Clayton MRN: [id number] Date of Birth: [birth year]

## 2017-08-06 ENCOUNTER — Ambulatory Visit: Payer: Medicare HMO | Admitting: Occupational Therapy

## 2017-08-06 DIAGNOSIS — I890 Lymphedema, not elsewhere classified: Secondary | ICD-10-CM | POA: Diagnosis not present

## 2017-08-06 NOTE — Therapy (Signed)
[place] MAIN [facility] SERVICES [address] [place], [place], [address] Phone: [phone]   Fax:  [phone]  Occupational Therapy Treatment  Patient Details  Name: Nicole Clayton MRN: [id number] Date of Birth: Sep 04, 1928 Referring Provider: [name], MD   Encounter Date: 08/06/2017  OT End of Session - 08/06/17 1216    Visit Number  4    Number of Visits  36    Date for OT Re-Evaluation  10/26/17    OT Start Time  0910    OT Stop Time  1015    OT Time Calculation (min)  65 min       Past Medical History:  Diagnosis Date  . Bladder infection, chronic 10/19/2011  . Cancer (HCC)    Ovarian   . 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 Claiborne Rigg and Delorise Shiner Choksi Overview:  Drs. Claiborne Rigg and [employer]   . Concussion   . GERD (gastroesophageal reflux disease)   . MI (mitral incompetence) 10/27/2014   Overview:  MODERATE   . Mitral valve prolapse   . OAB (overactive bladder)   . Ovarian cancer ([place])   . Pulmonary embolism ([place])   . Tachycardia     Past Surgical History:  Procedure Laterality Date  . ABDOMINAL HYSTERECTOMY    . CHOLECYSTECTOMY    . LAPAROSCOPIC BILATERAL SALPINGO OOPHERECTOMY  2011  . ovarian cancer surgery  2011    There were no vitals filed for this visit.  Subjective Assessment - 08/06/17 1207    Subjective   Mrs Balsley presents for OT visiit 4/36 for complete decongestive therapy to RLE. Pt is accompanied by her daughter-, [name]. Pt presents with wraps in place on RLE. She reports she fell yesterday in her apartment, but besides being sore, she refused medical attention. Duaghter believes lace of tennis shoes caused fall. Spent 1/3 of session discussing walker safety and keeping center of  gravity over base of support w/ walking, including head posture. Discussed not leavg walker to complete transfers.    Currently in Pain?  Yes chromnic pain  unchanged                   OT Treatments/Exercises (OP) - 08/06/17 0001      ADLs   ADL Education Given  Yes      Manual Therapy   Manual Therapy  Edema management;Manual Lymphatic Drainage (MLD);Compression Bandaging    Manual therapy comments  commenced MLD to RLE/ RLQ today    Manual Lymphatic Drainage (MLD)  Completed initial MLD session today utilizing "short neck" sequence, R axillary LN and ipsilateral   axillo-inguinal anastamosis in supine. Completed leg sements from proximal to distal w/ focus on lymphatic "bottleneck" at medial knee. Swelling visibly decreased after treatment. Wraps aplied  from toes to groin today   using CoWrap on R foot. Cont as per OPC.    Compression Bandaging  Short stretch compression wraps applied from foot to groin using gradient techniques- 4 wraps over snicgle layer Rosidal and stockinett             OT Education - 08/06/17 1214    Education Details  Cont Pt and family edu for compression wrapping intruducing techniques for extending gradient to groin superiorly , and to toes inferiorly. INtroduced Lymphatic structure and function  relative to MLD today. Explained clinical benefits and contraindications/ precautions.    Person(s) Educated  Associate Professor)  Methods  Explanation;Demonstration;Tactile cues;Verbal cues;Handout    Comprehension  Verbalized understanding;Returned demonstration;Need further instruction;Verbal cues required;Tactile cues required          OT Long Term Goals - 07/29/17 1634      OT LONG TERM GOAL #1   Title  Pt independent w/ lymphedema precautions/prevention principals and using printed reference to limit LE progression and infection risk.    Baseline  Max A    Time  2    Period  Weeks    Status  New    Target Date  -- 1oth OT visit      OT LONG TERM GOAL #2   Title  Lymphedema (LE) management/ self-care: Pt able to apply thigh length gradient compression wraps between OT visits with max A  from caregiver using correct gradient techniques within 2 weeks to achieve optimal limb volume reduction during Intensive Phase of CDT and optimal self-management over time.    Baseline  Max A    Time  2    Period  Weeks    Status  New    Target Date  -- 10th OT visit      OT LONG TERM GOAL #3   Title  Lymphedema (LE) management/ self-care:  Pt to achieve at least 10%  limb volume RLE reduction during Intensive Phase CDT to decrease infection and falls risk, to limit LE progression and to improve functional mobility and ambulation essential for optimal ADLs performance.    Baseline  Max A    Time  12    Period  Weeks    Status  New    Target Date  10/18/17      OT LONG TERM GOAL #4   Title  Pt to tolerate RLE compression garments and HOS devices and be able to don and doff them with modified independence using assistive devices and extra time PRN to ensure optimal LT management over time.     Baseline  max A    Time  12    Period  Weeks    Status  New    Target Date  10/18/17      OT LONG TERM GOAL #5   Title  Lymphedema (LE) Pain: Pt to report 25% reduction in RLE discomfort/ pain described as  "heaviness", "fullness", and tightness" by DC to improve ADLs performance, to increase leisure pursuits,  and to increase social participation and role performace.      Baseline  Max A    Time  12    Period  Weeks    Status  New    Target Date  10/28/17      Long Term Additional Goals   Additional Long Term Goals  Yes      OT LONG TERM GOAL #6   Title  Lymphedema (LE) management/ self-care:  During Management Phase CDT Pt to sustain reduced limb volumes achieved during Intensive Phase CDT at follow up visits during Management Phase CDT within 3% using LE self-care home program components as directed ( simple self MLD, skin care, ther ex and daily/ nightly compression garments/ devices) .    Baseline  Max A    Time  6    Period  Months    Status  New    Target Date  01/26/18               Patient will benefit from skilled therapeutic intervention in order to improve the following deficits and impairments:  Visit Diagnosis: Lymphedema, not elsewhere classified    Problem List Patient Active Problem List   Diagnosis Date Noted  . Pain and swelling of right lower leg 10/11/2016  . Anemia 09/11/2015  . UTI (urinary tract infection) due to Enterococcus 07/19/2015  . 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 Neuropathy 02/10/2015  . Midline cystocele 12/15/2014  . Vaginal atrophy 12/15/2014  . Status post hysterectomy with oophorectomy 12/15/2014  . Celiac disease 11/07/2014  . MI (mitral incompetence) 10/27/2014  . Infection of urinary tract 08/12/2014  . CD (celiac disease) 07/12/2014  . Mixed hyperlipidemia 04/12/2014  . Paroxysmal supraventricular tachycardia ([place]) 04/12/2014  . Thrombocythemia ([place]) 09/26/2013  . Vitamin D deficiency 09/26/2013  . Frequent UTI 09/26/2013  . Chest pain 07/28/2013  . Edema 07/06/2013  . Other symptoms involving urinary system 07/07/2012  . Female genuine stress incontinence 02/10/2012  . Bladder infection, chronic 10/19/2011  . Urge incontinence 10/19/2011  . Malignant neoplasm of right fallopian tube [facility]) 10/05/2009    Andrey Spearman, MS, OTR/L, [facility] 08/06/17 12:18 PM  [place] MAIN [facility] SERVICES [address] [place], [place], [address] Phone: [phone]   Fax:  [phone]  Name: KEMYAH BUSER MRN: [id number] Date of Birth: [birth year]

## 2017-08-13 ENCOUNTER — Ambulatory Visit: Payer: Medicare HMO | Admitting: Occupational Therapy

## 2017-08-13 DIAGNOSIS — I890 Lymphedema, not elsewhere classified: Secondary | ICD-10-CM | POA: Diagnosis not present

## 2017-08-13 NOTE — Therapy (Signed)
[place] MAIN [facility] SERVICES [address] [place], [place], [address] Phone: [phone]   Fax:  [phone]  Occupational Therapy Treatment  Patient Details  Name: Nicole Clayton MRN: [id number] Date of Birth: Sep 05, 1928 Referring Provider: [name], MD   Encounter Date: 08/13/2017  OT End of Session - 08/13/17 1410    Visit Number  5    Number of Visits  36    Date for OT Re-Evaluation  10/26/17    OT Start Time  1210    OT Stop Time  1325    OT Time Calculation (min)  75 min       Past Medical History:  Diagnosis Date  . Bladder infection, chronic 10/19/2011  . Cancer (HCC)    Ovarian   . 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 Claiborne Rigg and Delorise Shiner Choksi Overview:  Drs. Claiborne Rigg and [employer]   . Concussion   . GERD (gastroesophageal reflux disease)   . MI (mitral incompetence) 10/27/2014   Overview:  MODERATE   . Mitral valve prolapse   . OAB (overactive bladder)   . Ovarian cancer ([place])   . Pulmonary embolism ([place])   . Tachycardia     Past Surgical History:  Procedure Laterality Date  . ABDOMINAL HYSTERECTOMY    . CHOLECYSTECTOMY    . LAPAROSCOPIC BILATERAL SALPINGO OOPHERECTOMY  2011  . ovarian cancer surgery  2011    There were no vitals filed for this visit.  Subjective Assessment - 08/13/17 1403    Subjective   Nicole Clayton presents for OT visiit 5/36 for complete decongestive therapy to RLE. Pt is accompanied by her daughter-in-law. Pt reports she had to remove thigh length  wraps in the midfdle of the night after last session b/c leg was throbbing. Daughter reports she believes Pt may have sprained her ankle during visit interval.    Currently in Pain?  Yes chronic pain unchanged    Pain Location  Leg    Pain Orientation  Right                   OT Treatments/Exercises (OP) - 08/13/17 0001      Manual Therapy   Manual Therapy  Edema  management;Manual Lymphatic Drainage (MLD);Compression Bandaging    Manual Lymphatic Drainage (MLD)  MLD to RLE/ RLQ in supibne and sidelying utilizing short neck sequence at terminus, regional axillary watershed in supine, deep abdominal LNs and diaphram, and proximal to distal strokes to alll leg segments from groin to foot. Pt tolerated without difficulty.    Compression Bandaging  RLE knee length compression wraps today. . We';; continue to work on building tolerance for cvompression.             OT Education - 08/13/17 1409    Education Details  Continued skilled Pt/caregiver education  And LE ADL training throughout visit for lymphedema self care/ home program, including compression wrapping, compression garment and device wear/care, lymphatic pumping ther ex, simple self-MLD, and skin care. Discussed progress towards goals.     Person(s) Educated  Patient;Caregiver(s)    Methods  Explanation;Demonstration;Tactile cues;Verbal cues;Handout    Comprehension  Verbalized understanding;Returned demonstration;Need further instruction;Verbal cues required;Tactile cues required          OT Long Term Goals - 07/29/17 1634      OT LONG TERM GOAL #1   Title  Pt independent w/ lymphedema precautions/prevention principals and  using printed reference to limit LE progression and infection risk.    Baseline  Max A    Time  2    Period  Weeks    Status  New    Target Date  -- 1oth OT visit      OT LONG TERM GOAL #2   Title  Lymphedema (LE) management/ self-care: Pt able to apply thigh length gradient compression wraps between OT visits with max A from caregiver using correct gradient techniques within 2 weeks to achieve optimal limb volume reduction during Intensive Phase of CDT and optimal self-management over time.    Baseline  Max A    Time  2    Period  Weeks    Status  New    Target Date  -- 10th OT visit      OT LONG TERM GOAL #3   Title  Lymphedema (LE) management/ self-care:   Pt to achieve at least 10%  limb volume RLE reduction during Intensive Phase CDT to decrease infection and falls risk, to limit LE progression and to improve functional mobility and ambulation essential for optimal ADLs performance.    Baseline  Max A    Time  12    Period  Weeks    Status  New    Target Date  10/18/17      OT LONG TERM GOAL #4   Title  Pt to tolerate RLE compression garments and HOS devices and be able to don and doff them with modified independence using assistive devices and extra time PRN to ensure optimal LT management over time.     Baseline  max A    Time  12    Period  Weeks    Status  New    Target Date  10/18/17      OT LONG TERM GOAL #5   Title  Lymphedema (LE) Pain: Pt to report 25% reduction in RLE discomfort/ pain described as  "heaviness", "fullness", and tightness" by DC to improve ADLs performance, to increase leisure pursuits,  and to increase social participation and role performace.      Baseline  Max A    Time  12    Period  Weeks    Status  New    Target Date  10/28/17      Long Term Additional Goals   Additional Long Term Goals  Yes      OT LONG TERM GOAL #6   Title  Lymphedema (LE) management/ self-care:  During Management Phase CDT Pt to sustain reduced limb volumes achieved during Intensive Phase CDT at follow up visits during Management Phase CDT within 3% using LE self-care home program components as directed ( simple self MLD, skin care, ther ex and daily/ nightly compression garments/ devices) .    Baseline  Max A    Time  6    Period  Months    Status  New    Target Date  01/26/18            Plan - 08/13/17 1415    Clinical Impression Statement  Upon assessment no signs or symptoms of ankle sprain noted today. Pt reports ongoing soreness in her hip and back since falling a couple of weeks agoi. Provided MLD to RLE/ RLQ utilizing alternative axillary pathway and deep abdominal lymphatics. Pt required Max assist for bed mobility  from supine to sidelying. Pt tolerated MLD without difficulty. Continues skilled Pt and family edu re tolerance building for compression.  Pt opted for knee length wraps today vs thigh length wraps. C9ont as per OC.     Occupational performance deficits (Please refer to evaluation for details):  ADL's;IADL's;Work;Leisure;Social Participation;Other body image    Rehab Potential  Good       Patient will benefit from skilled therapeutic intervention in order to improve the following deficits and impairments:  Increased edema, Decreased skin integrity, Decreased knowledge of use of DME, Impaired flexibility, Pain, Decreased mobility, Impaired sensation, Decreased activity tolerance, Decreased range of motion, Decreased balance, Decreased knowledge of precautions, Difficulty walking  Visit Diagnosis: Lymphedema, not elsewhere classified    Problem List Patient Active Problem List   Diagnosis Date Noted  . Pain and swelling of right lower leg 10/11/2016  . Anemia 09/11/2015  . UTI (urinary tract infection) due to Enterococcus 07/19/2015  . 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 Neuropathy 02/10/2015  . Midline cystocele 12/15/2014  . Vaginal atrophy 12/15/2014  . Status post hysterectomy with oophorectomy 12/15/2014  . Celiac disease 11/07/2014  . MI (mitral incompetence) 10/27/2014  . Infection of urinary tract 08/12/2014  . CD (celiac disease) 07/12/2014  . Mixed hyperlipidemia 04/12/2014  . Paroxysmal supraventricular tachycardia ([place]) 04/12/2014  . Thrombocythemia ([place]) 09/26/2013  . Vitamin D deficiency 09/26/2013  . Frequent UTI 09/26/2013  . Chest pain 07/28/2013  . Edema 07/06/2013  . Other symptoms involving urinary system 07/07/2012  . Female genuine stress incontinence 02/10/2012  . Bladder infection, chronic 10/19/2011  . Urge incontinence 10/19/2011  . Malignant neoplasm of right fallopian tube [facility]) 10/05/2009    Andrey Spearman, MS, OTR/L, [facility] 08/13/17 2:17  PM   [place] MAIN [facility] SERVICES [address] [place], [place], [address] Phone: [phone]   Fax:  [phone]  Name: SHERIE DOBROWOLSKI MRN: [id number] Date of Birth: 1928-10-20

## 2017-08-14 ENCOUNTER — Ambulatory Visit: Payer: Medicare HMO | Admitting: Occupational Therapy

## 2017-08-14 DIAGNOSIS — I890 Lymphedema, not elsewhere classified: Secondary | ICD-10-CM

## 2017-08-14 NOTE — Therapy (Signed)
[place] MAIN [facility] SERVICES [address] [place], [place], [address] Phone: [phone]   Fax:  [phone]  Occupational Therapy Treatment  Patient Details  Name: Nicole Clayton MRN: [id number] Date of Birth: January 15, 1929 Referring Provider: [name], MD   Encounter Date: 08/14/2017  OT End of Session - 08/14/17 1342    Visit Number  6    Number of Visits  36    Date for OT Re-Evaluation  10/26/17    OT Start Time  1100    OT Stop Time  1215    OT Time Calculation (min)  75 min       Past Medical History:  Diagnosis Date  . Bladder infection, chronic 10/19/2011  . Cancer (HCC)    Ovarian   . 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 Claiborne Rigg and Delorise Shiner Choksi Overview:  Drs. Claiborne Rigg and [employer]   . Concussion   . GERD (gastroesophageal reflux disease)   . MI (mitral incompetence) 10/27/2014   Overview:  MODERATE   . Mitral valve prolapse   . OAB (overactive bladder)   . Ovarian cancer ([place])   . Pulmonary embolism ([place])   . Tachycardia     Past Surgical History:  Procedure Laterality Date  . ABDOMINAL HYSTERECTOMY    . CHOLECYSTECTOMY    . LAPAROSCOPIC BILATERAL SALPINGO OOPHERECTOMY  2011  . ovarian cancer surgery  2011    There were no vitals filed for this visit.  Subjective Assessment - 08/14/17 1103    Subjective   Nicole Clayton presents for OT visiit 6/36 for complete decongestive therapy to RLE. Pt is accompanied by her daughter. Pt denies pain in the R leg overnight. She reports she slept well and had no difficulty toleratingf knee length compression wrap.    Currently in Pain?  Yes Chronic pain unchanged.                   OT Treatments/Exercises (OP) - 08/14/17 0001      ADLs   ADL Education Given  Yes      Manual Therapy   Manual Therapy  Edema management;Manual Lymphatic Drainage (MLD);Compression Bandaging    Manual Lymphatic  Drainage (MLD)  MLD to RLE/ RLQ in supibne and sidelying utilizing short neck sequence at terminus, regional axillary watershed in supine, deep abdominal LNs and diaphram, and proximal to distal strokes to alll leg segments from groin to foot. Pt tolerated without difficulty.    Compression Bandaging  RLE knee length compression wraps today. . We';; continue to work on building tolerance for cvompression.             OT Education - 08/14/17 1342    Education Details  Continued skilled Pt/caregiver education  And LE ADL training throughout visit for lymphedema self care/ home program, including compression wrapping, compression garment and device wear/care, lymphatic pumping ther ex, simple self-MLD, and skin care. Discussed progress towards goals.     Person(s) Educated  Patient;Caregiver(s)    Methods  Explanation;Demonstration;Tactile cues;Verbal cues;Handout    Comprehension  Verbalized understanding;Returned demonstration;Need further instruction;Verbal cues required;Tactile cues required          OT Long Term Goals - 07/29/17 1634      OT LONG TERM GOAL #1   Title  Pt independent w/ lymphedema precautions/prevention principals and using printed reference to limit LE progression and infection risk.    Baseline  Max A    Time  2    Period  Weeks    Status  New    Target Date  -- 1oth OT visit      OT LONG TERM GOAL #2   Title  Lymphedema (LE) management/ self-care: Pt able to apply thigh length gradient compression wraps between OT visits with max A from caregiver using correct gradient techniques within 2 weeks to achieve optimal limb volume reduction during Intensive Phase of CDT and optimal self-management over time.    Baseline  Max A    Time  2    Period  Weeks    Status  New    Target Date  -- 10th OT visit      OT LONG TERM GOAL #3   Title  Lymphedema (LE) management/ self-care:  Pt to achieve at least 10%  limb volume RLE reduction during Intensive Phase CDT to  decrease infection and falls risk, to limit LE progression and to improve functional mobility and ambulation essential for optimal ADLs performance.    Baseline  Max A    Time  12    Period  Weeks    Status  New    Target Date  10/18/17      OT LONG TERM GOAL #4   Title  Pt to tolerate RLE compression garments and HOS devices and be able to don and doff them with modified independence using assistive devices and extra time PRN to ensure optimal LT management over time.     Baseline  max A    Time  12    Period  Weeks    Status  New    Target Date  10/18/17      OT LONG TERM GOAL #5   Title  Lymphedema (LE) Pain: Pt to report 25% reduction in RLE discomfort/ pain described as  "heaviness", "fullness", and tightness" by DC to improve ADLs performance, to increase leisure pursuits,  and to increase social participation and role performace.      Baseline  Max A    Time  12    Period  Weeks    Status  New    Target Date  10/28/17      Long Term Additional Goals   Additional Long Term Goals  Yes      OT LONG TERM GOAL #6   Title  Lymphedema (LE) management/ self-care:  During Management Phase CDT Pt to sustain reduced limb volumes achieved during Intensive Phase CDT at follow up visits during Management Phase CDT within 3% using LE self-care home program components as directed ( simple self MLD, skin care, ther ex and daily/ nightly compression garments/ devices) .    Baseline  Max A    Time  6    Period  Months    Status  New    Target Date  01/26/18            Plan - 08/14/17 1343    Clinical Impression Statement  RLE below the knee to foot veru well decongested overnight with well tolerated, knee length cmpression wrap. There is a clear positive indentation at medial knee where wrap ended revealing pitting edema at the knee and proximally to the groin. Pt did not bring remaining wraps so we can wrap to the groin in clinic today, but daughter will apply when they return home  this afternoon. Pt understands rational for extending wraps above knee to inguinal LN to fully decongest thigh and  try to achieve body symmetry again to reduce balance issues and falls risk. Performed MLD as established rerouting towards deep abdomen and axillary LN. Reapplied wraps . Provided Pt and family edu for lymphatic pumping ther ex and compression wrapping. Cont as per OC.    Occupational Profile and client history currently impacting functional performance  uinl  L    Occupational performance deficits (Please refer to evaluation for details):  ADL's;IADL's;Work;Leisure;Social Participation;Other body image    Rehab Potential  Good       Patient will benefit from skilled therapeutic intervention in order to improve the following deficits and impairments:  Increased edema, Decreased skin integrity, Decreased knowledge of use of DME, Impaired flexibility, Pain, Decreased mobility, Impaired sensation, Decreased activity tolerance, Decreased range of motion, Decreased balance, Decreased knowledge of precautions, Difficulty walking  Visit Diagnosis: Lymphedema, not elsewhere classified    Problem List Patient Active Problem List   Diagnosis Date Noted  . Pain and swelling of right lower leg 10/11/2016  . Anemia 09/11/2015  . UTI (urinary tract infection) due to Enterococcus 07/19/2015  . 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 Neuropathy 02/10/2015  . Midline cystocele 12/15/2014  . Vaginal atrophy 12/15/2014  . Status post hysterectomy with oophorectomy 12/15/2014  . Celiac disease 11/07/2014  . MI (mitral incompetence) 10/27/2014  . Infection of urinary tract 08/12/2014  . CD (celiac disease) 07/12/2014  . Mixed hyperlipidemia 04/12/2014  . Paroxysmal supraventricular tachycardia ([place]) 04/12/2014  . Thrombocythemia ([place]) 09/26/2013  . Vitamin D deficiency 09/26/2013  . Frequent UTI 09/26/2013  . Chest pain 07/28/2013  . Edema 07/06/2013  . Other symptoms involving urinary system  07/07/2012  . Female genuine stress incontinence 02/10/2012  . Bladder infection, chronic 10/19/2011  . Urge incontinence 10/19/2011  . Malignant neoplasm of right fallopian tube [facility]) 10/05/2009    Andrey Spearman, MS, OTR/L, [place] 08/14/17 1:48 PM  [place] MAIN [facility] SERVICES [address] [place], [place], [address] Phone: [phone]   Fax:  [phone]  Name: Nicole Clayton MRN: [id number] Date of Birth: 07-Jun-1928

## 2017-08-18 ENCOUNTER — Ambulatory Visit: Payer: Medicare HMO | Admitting: Occupational Therapy

## 2017-08-18 DIAGNOSIS — I890 Lymphedema, not elsewhere classified: Secondary | ICD-10-CM

## 2017-08-18 NOTE — Therapy (Signed)
[place] MAIN [facility] SERVICES [address] [place], [place], [address] Phone: [phone]   Fax:  [phone]  Occupational Therapy Treatment  Patient Details  Name: Nicole Clayton MRN: [id number] Date of Birth: 09/23/1928 Referring Provider: [name], MD   Encounter Date: 08/18/2017  OT End of Session - 08/18/17 1225    Visit Number  7    Number of Visits  36    Date for OT Re-Evaluation  10/26/17    OT Start Time  1110    OT Stop Time  1215    OT Time Calculation (min)  65 min       Past Medical History:  Diagnosis Date  . Bladder infection, chronic 10/19/2011  . Cancer (HCC)    Ovarian   . 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 Claiborne Rigg and Delorise Shiner Choksi Overview:  Drs. Claiborne Rigg and [employer]   . Concussion   . GERD (gastroesophageal reflux disease)   . MI (mitral incompetence) 10/27/2014   Overview:  MODERATE   . Mitral valve prolapse   . OAB (overactive bladder)   . Ovarian cancer ([place])   . Pulmonary embolism ([place])   . Tachycardia     Past Surgical History:  Procedure Laterality Date  . ABDOMINAL HYSTERECTOMY    . CHOLECYSTECTOMY    . LAPAROSCOPIC BILATERAL SALPINGO OOPHERECTOMY  2011  . ovarian cancer surgery  2011    There were no vitals filed for this visit.  Subjective Assessment - 08/18/17 1221    Subjective   Nicole Clayton presents for OT visiit 7/36 for complete decongestive therapy to RLE. Pt is accompanied by her daughter. Pt reports she tolerated compression over the we4ekend without difficulty. Daughters continue to assist Pt with wraps daily between OT visits,.    Currently in Pain?  Yes chronic pain unchanged    Pain Type  Chronic pain;Neuropathic pain                   OT Treatments/Exercises (OP) - 08/18/17 0001      ADLs   ADL Education Given  Yes      Manual Therapy   Manual Therapy  Edema management;Manual Lymphatic  Drainage (MLD);Compression Bandaging    Manual therapy comments  skin care to RLE      during MLD w/ low ph castor oil    Manual Lymphatic Drainage (MLD)  MLD to RLE/ RLQ in supibne and sidelying utilizing short neck sequence at terminus, regional axillary watershed in supine, deep abdominal LNs and diaphram, and proximal to distal strokes to alll leg segments from groin to foot. Pt tolerated without difficulty.    Compression Bandaging  RLE knee length compression wraps today. . We';; continue to work on building tolerance for cvompression.             OT Education - 08/18/17 1223    Education Details  Continued skilled Pt/caregiver education  And LE ADL training throughout visit for lymphedema self care/ home program, including compression wrapping, compression garment and device wear/care, lymphatic pumping ther ex, simple self-MLD, and skin care. Discussed progress towards goals.     Person(s) Educated  Patient;Caregiver(s)    Methods  Explanation;Demonstration;Tactile cues;Verbal cues;Handout    Comprehension  Verbalized understanding;Returned demonstration;Need further instruction;Verbal cues required;Tactile cues required          OT Long Term Goals - 07/29/17 1634  OT LONG TERM GOAL #1   Title  Pt independent w/ lymphedema precautions/prevention principals and using printed reference to limit LE progression and infection risk.    Baseline  Max A    Time  2    Period  Weeks    Status  New    Target Date  -- 1oth OT visit      OT LONG TERM GOAL #2   Title  Lymphedema (LE) management/ self-care: Pt able to apply thigh length gradient compression wraps between OT visits with max A from caregiver using correct gradient techniques within 2 weeks to achieve optimal limb volume reduction during Intensive Phase of CDT and optimal self-management over time.    Baseline  Max A    Time  2    Period  Weeks    Status  New    Target Date  -- 10th OT visit      OT LONG TERM GOAL  #3   Title  Lymphedema (LE) management/ self-care:  Pt to achieve at least 10%  limb volume RLE reduction during Intensive Phase CDT to decrease infection and falls risk, to limit LE progression and to improve functional mobility and ambulation essential for optimal ADLs performance.    Baseline  Max A    Time  12    Period  Weeks    Status  New    Target Date  10/18/17      OT LONG TERM GOAL #4   Title  Pt to tolerate RLE compression garments and HOS devices and be able to don and doff them with modified independence using assistive devices and extra time PRN to ensure optimal LT management over time.     Baseline  max A    Time  12    Period  Weeks    Status  New    Target Date  10/18/17      OT LONG TERM GOAL #5   Title  Lymphedema (LE) Pain: Pt to report 25% reduction in RLE discomfort/ pain described as  "heaviness", "fullness", and tightness" by DC to improve ADLs performance, to increase leisure pursuits,  and to increase social participation and role performace.      Baseline  Max A    Time  12    Period  Weeks    Status  New    Target Date  10/28/17      Long Term Additional Goals   Additional Long Term Goals  Yes      OT LONG TERM GOAL #6   Title  Lymphedema (LE) management/ self-care:  During Management Phase CDT Pt to sustain reduced limb volumes achieved during Intensive Phase CDT at follow up visits during Management Phase CDT within 3% using LE self-care home program components as directed ( simple self MLD, skin care, ther ex and daily/ nightly compression garments/ devices) .    Baseline  Max A    Time  6    Period  Months    Status  New    Target Date  01/26/18            Plan - 08/18/17 1225    Clinical Impression Statement  Pt's daughter reports wraps were removed this morning before driving over to clinic,. Pt tolerated all aspects of manual therapy without difficiulty. Provided CG review of correct gradient techiques while applting compression fraps  from toes to groin today. Upon assessment, RLE limb volume reduction is observed throughout  entire limb with  proximal clearance more pronounced than distal ly. With swelling reduction tissue fibrosis below the knee     is more palpable and visually apparent. Pitting persists. Cont as per POC. Cont to support family CG to assist for optimal outcomes.    Occupational Profile and client history currently impacting functional performance  uinl  L    Occupational performance deficits (Please refer to evaluation for details):  ADL's;IADL's;Work;Leisure;Social Participation;Other body image    Rehab Potential  Good       Patient will benefit from skilled therapeutic intervention in order to improve the following deficits and impairments:  Increased edema, Decreased skin integrity, Decreased knowledge of use of DME, Impaired flexibility, Pain, Decreased mobility, Impaired sensation, Decreased activity tolerance, Decreased range of motion, Decreased balance, Decreased knowledge of precautions, Difficulty walking  Visit Diagnosis: Lymphedema, not elsewhere classified    Problem List Patient Active Problem List   Diagnosis Date Noted  . Pain and swelling of right lower leg 10/11/2016  . Anemia 09/11/2015  . UTI (urinary tract infection) due to Enterococcus 07/19/2015  . 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 Neuropathy 02/10/2015  . Midline cystocele 12/15/2014  . Vaginal atrophy 12/15/2014  . Status post hysterectomy with oophorectomy 12/15/2014  . Celiac disease 11/07/2014  . MI (mitral incompetence) 10/27/2014  . Infection of urinary tract 08/12/2014  . CD (celiac disease) 07/12/2014  . Mixed hyperlipidemia 04/12/2014  . Paroxysmal supraventricular tachycardia ([place]) 04/12/2014  . Thrombocythemia ([place]) 09/26/2013  . Vitamin D deficiency 09/26/2013  . Frequent UTI 09/26/2013  . Chest pain 07/28/2013  . Edema 07/06/2013  . Other symptoms involving urinary system 07/07/2012  . Female genuine  stress incontinence 02/10/2012  . Bladder infection, chronic 10/19/2011  . Urge incontinence 10/19/2011  . Malignant neoplasm of right fallopian tube [facility]) 10/05/2009    Andrey Spearman, MS, OTR/L, [facility] 08/18/17 12:31 PM   [place] MAIN [facility] SERVICES [address] [place], [place], [address] Phone: [phone]   Fax:  [phone]  Name: Nicole Clayton MRN: [id number] Date of Birth: 1928/08/22

## 2017-08-20 ENCOUNTER — Ambulatory Visit: Payer: Medicare HMO | Admitting: Occupational Therapy

## 2017-08-20 DIAGNOSIS — I890 Lymphedema, not elsewhere classified: Secondary | ICD-10-CM

## 2017-08-21 NOTE — Therapy (Signed)
[place] MAIN [facility] SERVICES [address] [place], [place], [address] Phone: [phone]   Fax:  [phone]  Occupational Therapy Treatment  Patient Details  Name: Nicole Clayton MRN: [id number] Date of Birth: July 17, 1928 Referring Provider: [name], MD   Encounter Date: 08/20/2017  OT End of Session - 08/20/17 1033    Visit Number  8    Number of Visits  36    Date for OT Re-Evaluation  10/26/17    OT Start Time  1100    OT Stop Time  1215    OT Time Calculation (min)  75 min    Activity Tolerance  Patient tolerated treatment well;No increased pain    Behavior During Therapy  WFL for tasks assessed/performed       Past Medical History:  Diagnosis Date  . Bladder infection, chronic 10/19/2011  . Cancer (HCC)    Ovarian   . 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 Claiborne Rigg and Delorise Shiner Choksi Overview:  Drs. Claiborne Rigg and [employer]   . Concussion   . GERD (gastroesophageal reflux disease)   . MI (mitral incompetence) 10/27/2014   Overview:  MODERATE   . Mitral valve prolapse   . OAB (overactive bladder)   . Ovarian cancer ([place])   . Pulmonary embolism ([place])   . Tachycardia     Past Surgical History:  Procedure Laterality Date  . ABDOMINAL HYSTERECTOMY    . CHOLECYSTECTOMY    . LAPAROSCOPIC BILATERAL SALPINGO OOPHERECTOMY  2011  . ovarian cancer surgery  2011    There were no vitals filed for this visit.  Subjective Assessment - 08/20/17 1031    Subjective   Nicole Clayton presents for OT visiit 8/36 for complete decongestive therapy to RLE. Pt is accompanied by her daughter. Pt has no new complaints.    Patient is accompained by:  Family member    Currently in Pain?  No/denies                   OT Treatments/Exercises (OP) - 08/21/17 0001      ADLs   ADL Education Given  Yes      Manual Therapy   Manual Therapy  Edema management;Manual Lymphatic  Drainage (MLD);Compression Bandaging    Manual therapy comments  skin care to RLE      during MLD w/ low ph castor oil    Edema Management  Completed baseline comparative limb volumetrics    Manual Lymphatic Drainage (MLD)  MLD to RLE/ RLQ in supibne and sidelying utilizing short neck sequence at terminus, regional axillary watershed in supine, deep abdominal LNs and diaphram, and proximal to distal strokes to alll leg segments from groin to foot. Pt tolerated without difficulty.    Compression Bandaging  RLE knee length compression wraps today. . We';; continue to work on building tolerance for cvompression.             OT Education - 08/21/17 1033    Education Details  Continued skilled Pt/caregiver education  And LE ADL training throughout visit for lymphedema self care/ home program, including compression wrapping, compression garment and device wear/care, lymphatic pumping ther ex, simple self-MLD, and skin care. Discussed progress towards goals.     Person(s) Educated  Patient;Caregiver(s)    Methods  Explanation;Demonstration;Tactile cues;Verbal cues;Handout    Comprehension  Verbalized understanding;Returned demonstration;Need further instruction;Verbal cues required;Tactile cues required  OT Long Term Goals - 07/29/17 1634      OT LONG TERM GOAL #1   Title  Pt independent w/ lymphedema precautions/prevention principals and using printed reference to limit LE progression and infection risk.    Baseline  Max A    Time  2    Period  Weeks    Status  New    Target Date  -- 1oth OT visit      OT LONG TERM GOAL #2   Title  Lymphedema (LE) management/ self-care: Pt able to apply thigh length gradient compression wraps between OT visits with max A from caregiver using correct gradient techniques within 2 weeks to achieve optimal limb volume reduction during Intensive Phase of CDT and optimal self-management over time.    Baseline  Max A    Time  2    Period  Weeks     Status  New    Target Date  -- 10th OT visit      OT LONG TERM GOAL #3   Title  Lymphedema (LE) management/ self-care:  Pt to achieve at least 10%  limb volume RLE reduction during Intensive Phase CDT to decrease infection and falls risk, to limit LE progression and to improve functional mobility and ambulation essential for optimal ADLs performance.    Baseline  Max A    Time  12    Period  Weeks    Status  New    Target Date  10/18/17      OT LONG TERM GOAL #4   Title  Pt to tolerate RLE compression garments and HOS devices and be able to don and doff them with modified independence using assistive devices and extra time PRN to ensure optimal LT management over time.     Baseline  max A    Time  12    Period  Weeks    Status  New    Target Date  10/18/17      OT LONG TERM GOAL #5   Title  Lymphedema (LE) Pain: Pt to report 25% reduction in RLE discomfort/ pain described as  "heaviness", "fullness", and tightness" by DC to improve ADLs performance, to increase leisure pursuits,  and to increase social participation and role performace.      Baseline  Max A    Time  12    Period  Weeks    Status  New    Target Date  10/28/17      Long Term Additional Goals   Additional Long Term Goals  Yes      OT LONG TERM GOAL #6   Title  Lymphedema (LE) management/ self-care:  During Management Phase CDT Pt to sustain reduced limb volumes achieved during Intensive Phase CDT at follow up visits during Management Phase CDT within 3% using LE self-care home program components as directed ( simple self MLD, skin care, ther ex and daily/ nightly compression garments/ devices) .    Baseline  Max A    Time  6    Period  Months    Status  New    Target Date  01/26/18            Plan - 08/20/17 1034    Clinical Impression Statement  Provided RLE MLD, skin care and gradient compression wrap from foot to groin. Continues skilled ADL training for LE self care throughout session. Limb volume  reduction is noticably more decreased in thigh today. Consider measuring for compression garments  next session.    Occupational Profile and client history currently impacting functional performance  uinl  L    Occupational performance deficits (Please refer to evaluation for details):  ADL's;IADL's;Work;Leisure;Social Participation;Other body image    Rehab Potential  Good    OT Frequency  2x / week    OT Treatment/Interventions  Self-care/ADL training;Therapeutic exercise;Manual Therapy;Manual lymph drainage;Therapeutic activities;Coping strategies training;DME and/or AE instruction;Compression bandaging;Patient/family education       Patient will benefit from skilled therapeutic intervention in order to improve the following deficits and impairments:  Increased edema, Decreased skin integrity, Decreased knowledge of use of DME, Impaired flexibility, Pain, Decreased mobility, Impaired sensation, Decreased activity tolerance, Decreased range of motion, Decreased balance, Decreased knowledge of precautions, Difficulty walking  Visit Diagnosis: Lymphedema, not elsewhere classified    Problem List Patient Active Problem List   Diagnosis Date Noted  . Pain and swelling of right lower leg 10/11/2016  . Anemia 09/11/2015  . UTI (urinary tract infection) due to Enterococcus 07/19/2015  . 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 Neuropathy 02/10/2015  . Midline cystocele 12/15/2014  . Vaginal atrophy 12/15/2014  . Status post hysterectomy with oophorectomy 12/15/2014  . Celiac disease 11/07/2014  . MI (mitral incompetence) 10/27/2014  . Infection of urinary tract 08/12/2014  . CD (celiac disease) 07/12/2014  . Mixed hyperlipidemia 04/12/2014  . Paroxysmal supraventricular tachycardia ([place]) 04/12/2014  . Thrombocythemia ([place]) 09/26/2013  . Vitamin D deficiency 09/26/2013  . Frequent UTI 09/26/2013  . Chest pain 07/28/2013  . Edema 07/06/2013  . Other symptoms involving urinary system 07/07/2012   . Female genuine stress incontinence 02/10/2012  . Bladder infection, chronic 10/19/2011  . Urge incontinence 10/19/2011  . Malignant neoplasm of right fallopian tube [facility]) 10/05/2009    Andrey Spearman, MS, OTR/L, [facility] 08/21/17 10:38 AM  [place] MAIN [facility] SERVICES [address] [place], [place], [address] Phone: [phone]   Fax:  [phone]  Name: Nicole Clayton MRN: [id number] Date of Birth: 1928/06/12

## 2017-08-26 ENCOUNTER — Ambulatory Visit: Payer: Medicare HMO | Admitting: Occupational Therapy

## 2017-08-26 DIAGNOSIS — I890 Lymphedema, not elsewhere classified: Secondary | ICD-10-CM

## 2017-08-26 NOTE — Therapy (Signed)
[place] MAIN [facility] SERVICES [address] [place], [place], [address] Phone: [phone]   Fax:  [phone]  Occupational Therapy Treatment  Patient Details  Name: Nicole Clayton MRN: [id number] Date of Birth: 1928-08-08 Referring Provider: [name], MD   Encounter Date: 08/26/2017  OT End of Session - 08/26/17 1253    Visit Number  9    Number of Visits  36    Date for OT Re-Evaluation  10/26/17    OT Start Time  1100    OT Stop Time  1222    OT Time Calculation (min)  82 min    Activity Tolerance  Patient tolerated treatment well;No increased pain    Behavior During Therapy  WFL for tasks assessed/performed       Past Medical History:  Diagnosis Date  . Bladder infection, chronic 10/19/2011  . Cancer (HCC)    Ovarian   . 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 Claiborne Rigg and Delorise Shiner Choksi Overview:  Drs. Claiborne Rigg and [employer]   . Concussion   . GERD (gastroesophageal reflux disease)   . MI (mitral incompetence) 10/27/2014   Overview:  MODERATE   . Mitral valve prolapse   . OAB (overactive bladder)   . Ovarian cancer ([place])   . Pulmonary embolism ([place])   . Tachycardia     Past Surgical History:  Procedure Laterality Date  . ABDOMINAL HYSTERECTOMY    . CHOLECYSTECTOMY    . LAPAROSCOPIC BILATERAL SALPINGO OOPHERECTOMY  2011  . ovarian cancer surgery  2011    There were no vitals filed for this visit.  Subjective Assessment - 08/26/17 1108    Subjective   [name] presents for OT visiit 9/36 for complete decongestive therapy to RLE. Pt is accompanied by her daughter. Pt c/o pin in R inguinal region.     Patient is accompained by:  Family member    Currently in Pain?  Yes    Pain Score  5     Pain Location  -- R inguinal region    Pain Orientation  Right    Pain Descriptors / Indicators  Sore;Aching    Pain Type  Other (Comment) radiation aftereffect     Pain Onset  More than a month ago    Pain Frequency  Intermittent    Multiple Pain Sites  No                   OT Treatments/Exercises (OP) - 08/26/17 0001      ADLs   ADL Education Given  Yes      Manual Therapy   Manual Therapy  Edema management;Compression Bandaging    Edema Management  anatomical measurements for RLE compression garments             OT Education - 08/26/17 1246    Education Details  Pt and fasmily edu for  compression garment recommendations , speciications, measuring and fitting process. Contact info provided for DME vendor              Person(s) Educated  Patient;Caregiver(s)    Methods  Explanation;Demonstration;Tactile cues;Verbal cues;Handout    Comprehension  Verbalized understanding;Returned demonstration;Need further instruction;Verbal cues required;Tactile cues required          OT Long Term Goals - 07/29/17 1634      OT LONG TERM GOAL #1   Title  Pt independent w/ lymphedema precautions/prevention principals  and using printed reference to limit LE progression and infection risk.    Baseline  Max A    Time  2    Period  Weeks    Status  New    Target Date  -- 1oth OT visit      OT LONG TERM GOAL #2   Title  Lymphedema (LE) management/ self-care: Pt able to apply thigh length gradient compression wraps between OT visits with max A from caregiver using correct gradient techniques within 2 weeks to achieve optimal limb volume reduction during Intensive Phase of CDT and optimal self-management over time.    Baseline  Max A    Time  2    Period  Weeks    Status  New    Target Date  -- 10th OT visit      OT LONG TERM GOAL #3   Title  Lymphedema (LE) management/ self-care:  Pt to achieve at least 10%  limb volume RLE reduction during Intensive Phase CDT to decrease infection and falls risk, to limit LE progression and to improve functional mobility and ambulation essential for optimal ADLs performance.    Baseline  Max A     Time  12    Period  Weeks    Status  New    Target Date  10/18/17      OT LONG TERM GOAL #4   Title  Pt to tolerate RLE compression garments and HOS devices and be able to don and doff them with modified independence using assistive devices and extra time PRN to ensure optimal LT management over time.     Baseline  max A    Time  12    Period  Weeks    Status  New    Target Date  10/18/17      OT LONG TERM GOAL #5   Title  Lymphedema (LE) Pain: Pt to report 25% reduction in RLE discomfort/ pain described as  "heaviness", "fullness", and tightness" by DC to improve ADLs performance, to increase leisure pursuits,  and to increase social participation and role performace.      Baseline  Max A    Time  12    Period  Weeks    Status  New    Target Date  10/28/17      Long Term Additional Goals   Additional Long Term Goals  Yes      OT LONG TERM GOAL #6   Title  Lymphedema (LE) management/ self-care:  During Management Phase CDT Pt to sustain reduced limb volumes achieved during Intensive Phase CDT at follow up visits during Management Phase CDT within 3% using LE self-care home program components as directed ( simple self MLD, skin care, ther ex and daily/ nightly compression garments/ devices) .    Baseline  Max A    Time  6    Period  Months    Status  New    Target Date  01/26/18            Plan - 08/26/17 1253    Clinical Impression Statement  Anatomical measurements do not allow fitting Pt with OTS compression thigh highh. Completed anatomical measurements for RLE, custom, [employer] , Elvarex Soft, thigh high compression garment ( flat knit ccl 2 = 25-35 mmHg).. Applied RLE thigh length compression wraps. Edu throughout session for garment options and ordering and fitting process. Faxed order to [employer]. Consider fitting LLE with OTS knee high , if possible.  Occupational Profile and client history currently impacting functional performance  uinl  L    Occupational performance  deficits (Please refer to evaluation for details):  ADL's;IADL's;Work;Leisure;Social Participation;Other body image    Rehab Potential  Good    OT Frequency  2x / week    OT Treatment/Interventions  Self-care/ADL training;Therapeutic exercise;Manual Therapy;Manual lymph drainage;Therapeutic activities;Coping strategies training;DME and/or AE instruction;Compression bandaging;Patient/family education       Patient will benefit from skilled therapeutic intervention in order to improve the following deficits and impairments:  Increased edema, Decreased skin integrity, Decreased knowledge of use of DME, Impaired flexibility, Pain, Decreased mobility, Impaired sensation, Decreased activity tolerance, Decreased range of motion, Decreased balance, Decreased knowledge of precautions, Difficulty walking  Visit Diagnosis: Lymphedema, not elsewhere classified    Problem List Patient Active Problem List   Diagnosis Date Noted  . Pain and swelling of right lower leg 10/11/2016  . Anemia 09/11/2015  . UTI (urinary tract infection) due to Enterococcus 07/19/2015  . 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 Neuropathy 02/10/2015  . Midline cystocele 12/15/2014  . Vaginal atrophy 12/15/2014  . Status post hysterectomy with oophorectomy 12/15/2014  . Celiac disease 11/07/2014  . MI (mitral incompetence) 10/27/2014  . Infection of urinary tract 08/12/2014  . CD (celiac disease) 07/12/2014  . Mixed hyperlipidemia 04/12/2014  . Paroxysmal supraventricular tachycardia ([place]) 04/12/2014  . Thrombocythemia ([place]) 09/26/2013  . Vitamin D deficiency 09/26/2013  . Frequent UTI 09/26/2013  . Chest pain 07/28/2013  . Edema 07/06/2013  . Other symptoms involving urinary system 07/07/2012  . Female genuine stress incontinence 02/10/2012  . Bladder infection, chronic 10/19/2011  . Urge incontinence 10/19/2011  . Malignant neoplasm of right fallopian tube [facility]) 10/05/2009    Andrey Spearman, MS, OTR/L,  [facility] 08/26/17 12:59 PM   [place] MAIN [facility] SERVICES [address] [place], [place], [address] Phone: [phone]   Fax:  [phone]  Name: JAYDEE Clayton MRN: [id number] Date of Birth: 07-Nov-1928

## 2017-08-28 ENCOUNTER — Ambulatory Visit: Payer: Medicare HMO | Admitting: Occupational Therapy

## 2017-08-28 DIAGNOSIS — I890 Lymphedema, not elsewhere classified: Secondary | ICD-10-CM

## 2017-08-28 NOTE — Therapy (Signed)
[place] MAIN [facility] SERVICES [address] [place], [place], [address] Phone: [phone]   Fax:  [phone]  Occupational Therapy Treatment Note and Progress Report  Patient Details  Name: Nicole Clayton MRN: [id number] Date of Birth: 1928-03-08 Referring Provider: [name], MD   Encounter Date: 08/28/2017  OT End of Session - 08/28/17 1139    Visit Number  10    Number of Visits  36    Date for OT Re-Evaluation  10/26/17    OT Start Time  1012    OT Stop Time  1112    OT Time Calculation (min)  60 min    Activity Tolerance  Patient tolerated treatment well;No increased pain    Behavior During Therapy  WFL for tasks assessed/performed       Past Medical History:  Diagnosis Date  . Bladder infection, chronic 10/19/2011  . Cancer (HCC)    Ovarian   . 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 Claiborne Rigg and Delorise Shiner Choksi Overview:  Drs. Claiborne Rigg and [employer]   . Concussion   . GERD (gastroesophageal reflux disease)   . MI (mitral incompetence) 10/27/2014   Overview:  MODERATE   . Mitral valve prolapse   . OAB (overactive bladder)   . Ovarian cancer ([place])   . Pulmonary embolism ([place])   . Tachycardia     Past Surgical History:  Procedure Laterality Date  . ABDOMINAL HYSTERECTOMY    . CHOLECYSTECTOMY    . LAPAROSCOPIC BILATERAL SALPINGO OOPHERECTOMY  2011  . ovarian cancer surgery  2011    There were no vitals filed for this visit.  Subjective Assessment - 08/28/17 1135    Subjective   Mrs Nicole Clayton presents for OT visiit 10/36 for complete decongestive therapy to RLE. Pt is accompanied by her daughte, Nicole Clayton, today. Pt c/o ongoing chemo-related neuropathy . Pt and family educated as to how lack of sensation impacts fall risk.     Patient is accompained by:  Family member    Currently in Pain?  Yes no RLE pain today. Chronic neuopathy not   rated numerically    Pain  Location  Foot    Pain Orientation  Right;Left    Pain Descriptors / Indicators  Numbness    Pain Type  Neuropathic pain    Pain Onset  More than a month ago                   OT Treatments/Exercises (OP) - 08/28/17 0001      ADLs   ADL Education Given  Yes      Manual Therapy   Manual Therapy  Edema management;Compression Bandaging    Manual therapy comments  skin care to RLE      during MLD w/ low ph castor oil    Manual Lymphatic Drainage (MLD)  MLD to RLE/ RLQ in supibne and sidelying utilizing short neck sequence at terminus, regional axillary watershed in supine, deep abdominal LNs and diaphram, and proximal to distal strokes to alll leg segments from groin to foot. Pt tolerated without difficulty.    Compression Bandaging  RLE full length gradient compression wrap applied as established.             OT Education - 08/28/17 1145    Education Details  Continued skilled Pt/caregiver education  And LE ADL training throughout visit for lymphedema self care/ home program, including compression wrapping,  compression garment and device wear/care, lymphatic pumping ther ex, simple self-MLD, and skin care. Discussed progress towards goals.     Person(s) Educated  Patient;Child(ren)    Methods  Explanation;Demonstration;Tactile cues;Verbal cues;Handout    Comprehension  Verbalized understanding;Returned demonstration;Need further instruction;Verbal cues required;Tactile cues required          OT Long Term Goals - 08/28/17 1140      OT LONG TERM GOAL #1   Title  Pt independent w/ lymphedema precautions/prevention principals and using printed reference to limit LE progression and infection risk.    Baseline  Max A    Time  2    Period  Weeks    Status  Achieved      OT LONG TERM GOAL #2   Title  Lymphedema (LE) management/ self-care: Pt able to apply thigh length gradient compression wraps between OT visits with max A from caregiver using correct gradient  techniques within 2 weeks to achieve optimal limb volume reduction during Intensive Phase of CDT and optimal self-management over time.    Baseline  Max A    Time  2    Period  Weeks    Status  Achieved      OT LONG TERM GOAL #3   Title  Lymphedema (LE) management/ self-care:  Pt to achieve at least 10%  limb volume RLE reduction during Intensive Phase CDT to decrease infection and falls risk, to limit LE progression and to improve functional mobility and ambulation essential for optimal ADLs performance.    Baseline  Max A    Time  12    Period  Weeks    Status  Partially Met      OT LONG TERM GOAL #4   Title  Pt to tolerate RLE compression garments and HOS devices and be able to don and doff them with modified independence using assistive devices and extra time PRN to ensure optimal LT management over time.     Baseline  max A    Time  12    Period  Weeks    Status  On-going      OT LONG TERM GOAL #5   Title  Lymphedema (LE) Pain: Pt to report 25% reduction in RLE discomfort/ pain described as  "heaviness", "fullness", and tightness" by DC to improve ADLs performance, to increase leisure pursuits,  and to increase social participation and role performace.      Baseline  Max A    Time  12    Period  Weeks    Status  Partially Met      OT LONG TERM GOAL #6   Title  Lymphedema (LE) management/ self-care:  During Management Phase CDT Pt to sustain reduced limb volumes achieved during Intensive Phase CDT at follow up visits during Management Phase CDT within 3% using LE self-care home program components as directed ( simple self MLD, skin care, ther ex and daily/ nightly compression garments/ devices) .    Baseline  Max A    Time  6    Period  Months    Status  On-going            Plan - 08/28/17 1145    Clinical Impression Statement  PtProvided RLE MLD, skin care, compression therapy and Pt/ CG edu today/ Pt tolerated all aspects of therapy without difficulty. Reviewed  progress towards goals and discussed plan going forward with Pt and daughter throuyghout session. Pt ha made excellent progress to day and caregivers continue  to be very supportive and consistent with assistance between visits. Pt is  modified independent with LE precautions using printed reference. Caregivers are able to apple compression wraps using proper gradient technique. Pt has achieved visible and palpable limb volume reduction  with notable decrease in tissue density since commencing OT for CDT. Limb volumetrics  TBA next visit. Remaining goals are ongoing. RLE custom thigh length compression garment has been ordered and vendor is working on estimate for Pt. Cont as per POC. Cont at 3 x weekly frequebncy to continue to stimulate RLE/ RLQ lymphatics. Fit garment ASAP and transition to Management Phase CDT.    Occupational Profile and client history currently impacting functional performance  uinl  L    Occupational performance deficits (Please refer to evaluation for details):  ADL's;IADL's;Work;Leisure;Social Participation;Other body image    Rehab Potential  Good    OT Frequency  2x / week    OT Treatment/Interventions  Self-care/ADL training;Therapeutic exercise;Manual Therapy;Manual lymph drainage;Therapeutic activities;Coping strategies training;DME and/or AE instruction;Compression bandaging;Patient/family education       Patient will benefit from skilled therapeutic intervention in order to improve the following deficits and impairments:  Increased edema, Decreased skin integrity, Decreased knowledge of use of DME, Impaired flexibility, Pain, Decreased mobility, Impaired sensation, Decreased activity tolerance, Decreased range of motion, Decreased balance, Decreased knowledge of precautions, Difficulty walking  Visit Diagnosis: Lymphedema, not elsewhere classified    Problem List Patient Active Problem List   Diagnosis Date Noted  . Pain and swelling of right lower leg 10/11/2016   . Anemia 09/11/2015  . UTI (urinary tract infection) due to Enterococcus 07/19/2015  . 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 Neuropathy 02/10/2015  . Midline cystocele 12/15/2014  . Vaginal atrophy 12/15/2014  . Status post hysterectomy with oophorectomy 12/15/2014  . Celiac disease 11/07/2014  . MI (mitral incompetence) 10/27/2014  . Infection of urinary tract 08/12/2014  . CD (celiac disease) 07/12/2014  . Mixed hyperlipidemia 04/12/2014  . Paroxysmal supraventricular tachycardia ([place]) 04/12/2014  . Thrombocythemia ([place]) 09/26/2013  . Vitamin D deficiency 09/26/2013  . Frequent UTI 09/26/2013  . Chest pain 07/28/2013  . Edema 07/06/2013  . Other symptoms involving urinary system 07/07/2012  . Female genuine stress incontinence 02/10/2012  . Bladder infection, chronic 10/19/2011  . Urge incontinence 10/19/2011  . Malignant neoplasm of right fallopian tube [facility]) 10/05/2009    Andrey Spearman, MS, OTR/L, [facility] 08/28/17 11:51 AM   [place] MAIN [facility] SERVICES [address] [place], [place], [address] Phone: [phone]   Fax:  [phone]  Name: Nicole Clayton MRN: [id number] Date of Birth: 1928-04-03

## 2017-09-01 ENCOUNTER — Ambulatory Visit: Payer: Medicare HMO | Admitting: Occupational Therapy

## 2017-09-01 DIAGNOSIS — I890 Lymphedema, not elsewhere classified: Secondary | ICD-10-CM

## 2017-09-01 NOTE — Therapy (Signed)
[place] MAIN [facility] SERVICES [address] [place], [place], [address] Phone: [phone]   Fax:  [phone]  Occupational Therapy Treatment  Patient Details  Name: Nicole Clayton MRN: [id number] Date of Birth: July 23, 1928 Referring Provider: [name], MD   Encounter Date: 09/01/2017  OT End of Session - 09/01/17 1553    Visit Number  11    Number of Visits  36    Date for OT Re-Evaluation  10/26/17    OT Start Time  1000    OT Stop Time  1115    OT Time Calculation (min)  75 min    Activity Tolerance  Patient tolerated treatment well;No increased pain    Behavior During Therapy  WFL for tasks assessed/performed       Past Medical History:  Diagnosis Date  . Bladder infection, chronic 10/19/2011  . Cancer (HCC)    Ovarian   . 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 Claiborne Rigg and Delorise Shiner Choksi Overview:  Drs. Claiborne Rigg and [employer]   . Concussion   . GERD (gastroesophageal reflux disease)   . MI (mitral incompetence) 10/27/2014   Overview:  MODERATE   . Mitral valve prolapse   . OAB (overactive bladder)   . Ovarian cancer ([place])   . Pulmonary embolism ([place])   . Tachycardia     Past Surgical History:  Procedure Laterality Date  . ABDOMINAL HYSTERECTOMY    . CHOLECYSTECTOMY    . LAPAROSCOPIC BILATERAL SALPINGO OOPHERECTOMY  2011  . ovarian cancer surgery  2011    There were no vitals filed for this visit.  Subjective Assessment - 09/01/17 1549    Subjective   Mrs Haning presents for OT visiit 11/36 for complete decongestive therapy to RLE. Pt is accompanied by her daughter in law. Pt has no new complaints.    Patient is accompained by:  Family member    Currently in Pain?  Yes chronic pain unchanged    Pain Location  Leg    Pain Orientation  Right;Left    Pain Descriptors / Indicators  Numbness    Pain Type  Neuropathic pain    Pain Onset  More than a month ago     Multiple Pain Sites  No                   OT Treatments/Exercises (OP) - 09/01/17 0001      ADLs   ADL Education Given  Yes      Manual Therapy   Manual Therapy  Edema management;Compression Bandaging;Manual Lymphatic Drainage (MLD)    Manual therapy comments  skin care to RLE      during MLD w/ low ph castor oil    Edema Management  anatomical measurements for OTS LLE knee high compression garments    Manual Lymphatic Drainage (MLD)  MLD to RLE/ RLQ in supibne and sidelying utilizing short neck sequence at terminus, regional axillary watershed in supine, deep abdominal LNs and diaphram, and proximal to distal strokes to alll leg segments from groin to foot. Pt tolerated without difficulty.    Compression Bandaging  RLE full length gradient compression wrap applied as established.             OT Education - 09/01/17 1552    Education Details  Provided printed reference for OTS compression garment recommendations and on line resource for vendor. Continued skilled Pt/caregiver education  And LE  ADL training throughout visit for lymphedema self care/ home program, including compression wrapping, compression garment and device wear/care, lymphatic pumping ther ex, simple self-MLD, and skin care. Discussed progress towards goals.     Person(s) Educated  Patient;Child(ren)    Methods  Explanation;Demonstration;Tactile cues;Verbal cues;Handout    Comprehension  Verbalized understanding;Returned demonstration;Need further instruction;Verbal cues required;Tactile cues required          OT Long Term Goals - 08/28/17 1140      OT LONG TERM GOAL #1   Title  Pt independent w/ lymphedema precautions/prevention principals and using printed reference to limit LE progression and infection risk.    Baseline  Max A    Time  2    Period  Weeks    Status  Achieved      OT LONG TERM GOAL #2   Title  Lymphedema (LE) management/ self-care: Pt able to apply thigh length gradient  compression wraps between OT visits with max A from caregiver using correct gradient techniques within 2 weeks to achieve optimal limb volume reduction during Intensive Phase of CDT and optimal self-management over time.    Baseline  Max A    Time  2    Period  Weeks    Status  Achieved      OT LONG TERM GOAL #3   Title  Lymphedema (LE) management/ self-care:  Pt to achieve at least 10%  limb volume RLE reduction during Intensive Phase CDT to decrease infection and falls risk, to limit LE progression and to improve functional mobility and ambulation essential for optimal ADLs performance.    Baseline  Max A    Time  12    Period  Weeks    Status  Partially Met      OT LONG TERM GOAL #4   Title  Pt to tolerate RLE compression garments and HOS devices and be able to don and doff them with modified independence using assistive devices and extra time PRN to ensure optimal LT management over time.     Baseline  max A    Time  12    Period  Weeks    Status  On-going      OT LONG TERM GOAL #5   Title  Lymphedema (LE) Pain: Pt to report 25% reduction in RLE discomfort/ pain described as  "heaviness", "fullness", and tightness" by DC to improve ADLs performance, to increase leisure pursuits,  and to increase social participation and role performace.      Baseline  Max A    Time  12    Period  Weeks    Status  Partially Met      OT LONG TERM GOAL #6   Title  Lymphedema (LE) management/ self-care:  During Management Phase CDT Pt to sustain reduced limb volumes achieved during Intensive Phase CDT at follow up visits during Management Phase CDT within 3% using LE self-care home program components as directed ( simple self MLD, skin care, ther ex and daily/ nightly compression garments/ devices) .    Baseline  Max A    Time  6    Period  Months    Status  On-going            Plan - 09/01/17 1554    Clinical Impression Statement  Provided RLE MLD as established, skin care throughout MLD  and compression wraps from toes to groin. Pt tolerated all aspects of CDT without difficulty today. Completed measurements for LLE off-the -shelf compression  knee high, and provided resource for online vendor.  Family will obtain recommended RLE Mediven  Comfort knee high, size 5, PETITE, ccl 1 ( 20-30 mmHg)  Cont as per POC.    Occupational Profile and client history currently impacting functional performance  uinl  L    Occupational performance deficits (Please refer to evaluation for details):  ADL's;IADL's;Work;Leisure;Social Participation;Other body image    Rehab Potential  Good    OT Frequency  2x / week    OT Treatment/Interventions  Self-care/ADL training;Therapeutic exercise;Manual Therapy;Manual lymph drainage;Therapeutic activities;Coping strategies training;DME and/or AE instruction;Compression bandaging;Patient/family education       Patient will benefit from skilled therapeutic intervention in order to improve the following deficits and impairments:  Increased edema, Decreased skin integrity, Decreased knowledge of use of DME, Impaired flexibility, Pain, Decreased mobility, Impaired sensation, Decreased activity tolerance, Decreased range of motion, Decreased balance, Decreased knowledge of precautions, Difficulty walking  Visit Diagnosis: Lymphedema, not elsewhere classified    Problem List Patient Active Problem List   Diagnosis Date Noted  . Pain and swelling of right lower leg 10/11/2016  . Anemia 09/11/2015  . UTI (urinary tract infection) due to Enterococcus 07/19/2015  . 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 Neuropathy 02/10/2015  . Midline cystocele 12/15/2014  . Vaginal atrophy 12/15/2014  . Status post hysterectomy with oophorectomy 12/15/2014  . Celiac disease 11/07/2014  . MI (mitral incompetence) 10/27/2014  . Infection of urinary tract 08/12/2014  . CD (celiac disease) 07/12/2014  . Mixed hyperlipidemia 04/12/2014  . Paroxysmal supraventricular tachycardia ([place])  04/12/2014  . Thrombocythemia ([place]) 09/26/2013  . Vitamin D deficiency 09/26/2013  . Frequent UTI 09/26/2013  . Chest pain 07/28/2013  . Edema 07/06/2013  . Other symptoms involving urinary system 07/07/2012  . Female genuine stress incontinence 02/10/2012  . Bladder infection, chronic 10/19/2011  . Urge incontinence 10/19/2011  . Malignant neoplasm of right fallopian tube [facility]) 10/05/2009    Andrey Spearman, MS, OTR/L, [facility] 09/01/17 3:59 PM  [place] MAIN [facility] SERVICES [address] [place], [place], [address] Phone: [phone]   Fax:  [phone]  Name: Nicole Clayton MRN: [id number] Date of Birth: 09-24-1928

## 2017-09-03 ENCOUNTER — Ambulatory Visit: Payer: Medicare HMO | Admitting: Occupational Therapy

## 2017-09-03 DIAGNOSIS — I890 Lymphedema, not elsewhere classified: Secondary | ICD-10-CM

## 2017-09-03 NOTE — Therapy (Signed)
[place] MAIN [facility] SERVICES [address] [place], [place], [address] Phone: [phone]   Fax:  [phone]  Occupational Therapy Treatment  Patient Details  Name: Nicole Clayton MRN: [id number] Date of Birth: 1928/02/14 Referring Provider: [name], MD   Encounter Date: 09/03/2017  OT End of Session - 09/03/17 1230    Visit Number  12    Number of Visits  36    Date for OT Re-Evaluation  10/26/17    OT Start Time  1100    OT Stop Time  1225    OT Time Calculation (min)  85 min    Equipment Utilized During Treatment  1225    Activity Tolerance  Patient tolerated treatment well;No increased pain    Behavior During Therapy  WFL for tasks assessed/performed       Past Medical History:  Diagnosis Date  . Bladder infection, chronic 10/19/2011  . Cancer (HCC)    Ovarian   . 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 Claiborne Rigg and Delorise Shiner Choksi Overview:  Drs. Claiborne Rigg and [employer]   . Concussion   . GERD (gastroesophageal reflux disease)   . MI (mitral incompetence) 10/27/2014   Overview:  MODERATE   . Mitral valve prolapse   . OAB (overactive bladder)   . Ovarian cancer ([place])   . Pulmonary embolism ([place])   . Tachycardia     Past Surgical History:  Procedure Laterality Date  . ABDOMINAL HYSTERECTOMY    . CHOLECYSTECTOMY    . LAPAROSCOPIC BILATERAL SALPINGO OOPHERECTOMY  2011  . ovarian cancer surgery  2011    There were no vitals filed for this visit.  Subjective Assessment - 09/03/17 1228    Subjective   Nicole Clayton presents for OT visiit 12/36 for complete decongestive therapy to RLE. Pt is accompanied by her daughter in law. Pt c/o pain in R knee. "It feels like it might give out on me sometimes."    Patient is accompained by:  Family member    Currently in Pain?  Yes chronic knee pain unchanged    Pain Location  Knee    Pain Orientation  Right    Pain  Descriptors / Indicators  Aching;Sore;Other (Comment) unstable    Pain Onset  More than a month ago                           OT Education - 09/03/17 1229    Education Details  Pt and CG edu re uses of K tape for supporting knee joint    Person(s) Educated  Patient;Child(ren)    Methods  Explanation;Demonstration    Comprehension  Verbalized understanding;Returned demonstration;Need further instruction          OT Long Term Goals - 08/28/17 1140      OT LONG TERM GOAL #1   Title  Pt independent w/ lymphedema precautions/prevention principals and using printed reference to limit LE progression and infection risk.    Baseline  Max A    Time  2    Period  Weeks    Status  Achieved      OT LONG TERM GOAL #2   Title  Lymphedema (LE) management/ self-care: Pt able to apply thigh length gradient compression wraps between OT visits with max A from caregiver using correct gradient techniques within 2 weeks to achieve optimal limb volume reduction during  Intensive Phase of CDT and optimal self-management over time.    Baseline  Max A    Time  2    Period  Weeks    Status  Achieved      OT LONG TERM GOAL #3   Title  Lymphedema (LE) management/ self-care:  Pt to achieve at least 10%  limb volume RLE reduction during Intensive Phase CDT to decrease infection and falls risk, to limit LE progression and to improve functional mobility and ambulation essential for optimal ADLs performance.    Baseline  Max A    Time  12    Period  Weeks    Status  Partially Met      OT LONG TERM GOAL #4   Title  Pt to tolerate RLE compression garments and HOS devices and be able to don and doff them with modified independence using assistive devices and extra time PRN to ensure optimal LT management over time.     Baseline  max A    Time  12    Period  Weeks    Status  On-going      OT LONG TERM GOAL #5   Title  Lymphedema (LE) Pain: Pt to report 25% reduction in RLE discomfort/ pain  described as  "heaviness", "fullness", and tightness" by DC to improve ADLs performance, to increase leisure pursuits,  and to increase social participation and role performace.      Baseline  Max A    Time  12    Period  Weeks    Status  Partially Met      OT LONG TERM GOAL #6   Title  Lymphedema (LE) management/ self-care:  During Management Phase CDT Pt to sustain reduced limb volumes achieved during Intensive Phase CDT at follow up visits during Management Phase CDT within 3% using LE self-care home program components as directed ( simple self MLD, skin care, ther ex and daily/ nightly compression garments/ devices) .    Baseline  Max A    Time  6    Period  Months    Status  On-going            Plan - 09/03/17 1231    Clinical Impression Statement  Provided MLD and thigh length compression wrapping to RLE as established. Applied kinesiotape to R knee to facilitate  correct patellar tracking and knee cap is unstable and moves with minimum pressure during MLD. Checked on garment status with DME vendor as family has not heard re cost info. Cont as per POC.    Occupational Profile and client history currently impacting functional performance  uinl  L    Occupational performance deficits (Please refer to evaluation for details):  ADL's;IADL's;Work;Leisure;Social Participation;Other body image    Rehab Potential  Good    OT Frequency  2x / week    OT Treatment/Interventions  Self-care/ADL training;Therapeutic exercise;Manual Therapy;Manual lymph drainage;Therapeutic activities;Coping strategies training;DME and/or AE instruction;Compression bandaging;Patient/family education       Patient will benefit from skilled therapeutic intervention in order to improve the following deficits and impairments:  Increased edema, Decreased skin integrity, Decreased knowledge of use of DME, Impaired flexibility, Pain, Decreased mobility, Impaired sensation, Decreased activity tolerance, Decreased range  of motion, Decreased balance, Decreased knowledge of precautions, Difficulty walking  Visit Diagnosis: Lymphedema, not elsewhere classified    Problem List Patient Active Problem List   Diagnosis Date Noted  . Pain and swelling of right lower leg 10/11/2016  . Anemia 09/11/2015  .  UTI (urinary tract infection) due to Enterococcus 07/19/2015  . 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 Neuropathy 02/10/2015  . Midline cystocele 12/15/2014  . Vaginal atrophy 12/15/2014  . Status post hysterectomy with oophorectomy 12/15/2014  . Celiac disease 11/07/2014  . MI (mitral incompetence) 10/27/2014  . Infection of urinary tract 08/12/2014  . CD (celiac disease) 07/12/2014  . Mixed hyperlipidemia 04/12/2014  . Paroxysmal supraventricular tachycardia ([place]) 04/12/2014  . Thrombocythemia ([place]) 09/26/2013  . Vitamin D deficiency 09/26/2013  . Frequent UTI 09/26/2013  . Chest pain 07/28/2013  . Edema 07/06/2013  . Other symptoms involving urinary system 07/07/2012  . Female genuine stress incontinence 02/10/2012  . Bladder infection, chronic 10/19/2011  . Urge incontinence 10/19/2011  . Malignant neoplasm of right fallopian tube [facility]) 10/05/2009   Andrey Spearman, MS, OTR/L, [facility] 09/03/17 12:34 PM  [place] MAIN [facility] SERVICES [address] [place], [place], [address] Phone: [phone]   Fax:  [phone]  Name: Nicole Clayton MRN: [id number] Date of Birth: [birth year]

## 2017-09-04 ENCOUNTER — Encounter: Payer: Self-pay | Admitting: Radiation Oncology

## 2017-09-04 ENCOUNTER — Encounter: Payer: Medicare HMO | Admitting: Occupational Therapy

## 2017-09-04 ENCOUNTER — Ambulatory Visit
Admission: RE | Admit: 2017-09-04 | Discharge: 2017-09-04 | Disposition: A | Discharge status: Home / Self Care | Payer: Medicare HMO | Source: Ambulatory Visit | Attending: Radiation Oncology | Admitting: Radiation Oncology

## 2017-09-04 ENCOUNTER — Other Ambulatory Visit: Payer: Self-pay

## 2017-09-04 VITALS — BP 115/74 | HR 87 | Temp 97.2°F | Resp 18 | Wt 139.7 lb

## 2017-09-04 DIAGNOSIS — Z08 Encounter for follow-up examination after completed treatment for malignant neoplasm: Secondary | ICD-10-CM | POA: Insufficient documentation

## 2017-09-04 DIAGNOSIS — C5702 Malignant neoplasm of left fallopian tube: Secondary | ICD-10-CM | POA: Diagnosis not present

## 2017-09-04 DIAGNOSIS — C5701 Malignant neoplasm of right fallopian tube: Secondary | ICD-10-CM | POA: Diagnosis not present

## 2017-09-04 DIAGNOSIS — Z923 Personal history of irradiation: Secondary | ICD-10-CM | POA: Insufficient documentation

## 2017-09-04 DIAGNOSIS — I890 Lymphedema, not elsewhere classified: Secondary | ICD-10-CM | POA: Insufficient documentation

## 2017-09-04 NOTE — Progress Notes (Signed)
Radiation Oncology Follow up Note  Name: Nicole Clayton   Date:   09/04/2017 MRN:  [id number] DOB: Jul 10, 1928    This 84 y.o. female presents to the clinic today for one-month follow-up status post palliative radiation therapy to right inguinal region for adenocarcinoma fallopian tubes treated 5 years prior with progression of disease.  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 Seen today in routine follow up she is doing well. Working with lymphedema people lately lymphedema in her right lower extremity has markedly improved. She does have some increased lower urinary tract symptoms but this is predated all my therapy. She's also noticed marked reduction in the right inguinal region with no pain associated at this time..  COMPLICATIONS OF TREATMENT: none  FOLLOW UP COMPLIANCE: keeps appointments   PHYSICAL EXAM:  BP 115/74   Pulse 87   Temp (!) 97.2 F (36.2 C)   Resp 18   Wt 139 lb 10.6 oz (63.3 kg)   BMI 27.28 kg/m  Right inguinal area still firm although marked reduction in nodal involvement with approximate 75% response. She has lymphedema wrappings on a right lower extremity although still patient and daughter state this has improved.Well-developed well-nourished patient in NAD. HEENT reveals PERLA, EOMI, discs not visualized.  Oral cavity is clear. No oral mucosal lesions are identified. Neck is clear without evidence of cervical or supraclavicular adenopathy. Lungs are clear to A&P. Cardiac examination is essentially unremarkable with regular rate and rhythm without murmur rub or thrill. Abdomen is benign with no organomegaly or masses noted. Motor sensory and DTR levels are equal and symmetric in the upper and lower extremities. Cranial  nerves II through XII are grossly intact. Proprioception is intact. No peripheral adenopathy or edema is identified. No motor or sensory levels are noted. Crude visual fields are within normal range.  RADIOLOGY RESULTS: no current films for review  PLAN: present time she achieved excellent palliation from radiation therapy to her right inguinal region. I am going to turn follow-up care over to medical oncology. I would be happy to reevaluate the patient any time should further palliative treatment be indicated.  I would like to take this opportunity to thank you for allowing me to participate in the care of your patient.Noreene Filbert, MD

## 2017-09-08 ENCOUNTER — Ambulatory Visit: Payer: Medicare HMO | Attending: Internal Medicine | Admitting: Occupational Therapy

## 2017-09-08 DIAGNOSIS — I890 Lymphedema, not elsewhere classified: Secondary | ICD-10-CM | POA: Diagnosis not present

## 2017-09-08 NOTE — Therapy (Signed)
[place] MAIN [facility] SERVICES [address] [place], [place], [address] Phone: [phone]   Fax:  [phone]  Occupational Therapy Treatment  Patient Details  Name: DENEA Clayton MRN: [id number] Date of Birth: [birth year] Referring Provider: [name], MD   Encounter Date: 09/08/2017  OT End of Session - 09/08/17 1147    Visit Number  13    Number of Visits  36    Date for OT Re-Evaluation  10/26/17    OT Start Time  1005    OT Stop Time  1110    OT Time Calculation (min)  65 min    Equipment Utilized During Treatment  1225    Activity Tolerance  Patient tolerated treatment well;No increased pain    Behavior During Therapy  WFL for tasks assessed/performed       Past Medical History:  Diagnosis Date  . Bladder infection, chronic 10/19/2011  . Cancer (HCC)    Ovarian   . 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 Claiborne Rigg and Delorise Shiner Choksi Overview:  Drs. Claiborne Rigg and [employer]   . Concussion   . GERD (gastroesophageal reflux disease)   . MI (mitral incompetence) 10/27/2014   Overview:  MODERATE   . Mitral valve prolapse   . OAB (overactive bladder)   . Ovarian cancer ([place])   . Pulmonary embolism ([place])   . Tachycardia     Past Surgical History:  Procedure Laterality Date  . ABDOMINAL HYSTERECTOMY    . CHOLECYSTECTOMY    . LAPAROSCOPIC BILATERAL SALPINGO OOPHERECTOMY  2011  . ovarian cancer surgery  2011    There were no vitals filed for this visit.  Subjective Assessment - 09/08/17 1143    Subjective   [name] presents for OT visiit 13/36 for complete decongestive therapy to RLE. Pt is accompanied by her daughter in law. Pt expresses concern re cost of custom compression garments. Pt reports vendor called this past Friday with  cost information.     Patient is accompained by:  Family member    Currently in Pain?  Yes chronic neuro-pathic pain unchanged. Not  rated today    Pain Onset  More than a month ago                   OT Treatments/Exercises (OP) - 09/08/17 0001      ADLs   ADL Education Given  Yes      Manual Therapy   Manual Therapy  Edema management;Compression Bandaging;Manual Lymphatic Drainage (MLD)    Manual therapy comments  skin care to RLE      during MLD w/ low ph castor oil    Edema Management  checked RLE anatomical measurements again in effort to fit Pt with less expensive, OTS compression thigh high. As noted when last measured, the ankle measurement and calf + groin measurements do not fit within the size ranges  of off the shelf, less expensive compression garments.    Manual Lymphatic Drainage (MLD)  MLD to RLE/ RLQ in supibne and sidelying utilizing short neck sequence at terminus, regional axillary watershed in supine, deep abdominal LNs and diaphram, and proximal to distal strokes to alll leg segments from groin to foot. Pt tolerated without difficulty.    Compression Bandaging  RLE full length gradient compression wrap applied as established.             OT Education - 09/08/17 1145  Education Details  Provided printed reference for OTS compression garment recommendations and on line resource for vendor. Continued skilled Pt/caregiver education  And LE ADL training throughout visit for lymphedema self care/ home program, including compression wrapping, compression garment and device wear/care, lymphatic pumping ther ex, simple self-MLD, and skin care. Discussed progress towards goals.     Person(s) Educated  Patient;Child(ren)    Methods  Explanation;Demonstration;Handout    Comprehension  Verbalized understanding;Returned demonstration          OT Long Term Goals - 08/28/17 1140      OT LONG TERM GOAL #1   Title  Pt independent w/ lymphedema precautions/prevention principals and using printed reference to limit LE progression and infection risk.    Baseline  Max A    Time  2    Period   Weeks    Status  Achieved      OT LONG TERM GOAL #2   Title  Lymphedema (LE) management/ self-care: Pt able to apply thigh length gradient compression wraps between OT visits with max A from caregiver using correct gradient techniques within 2 weeks to achieve optimal limb volume reduction during Intensive Phase of CDT and optimal self-management over time.    Baseline  Max A    Time  2    Period  Weeks    Status  Achieved      OT LONG TERM GOAL #3   Title  Lymphedema (LE) management/ self-care:  Pt to achieve at least 10%  limb volume RLE reduction during Intensive Phase CDT to decrease infection and falls risk, to limit LE progression and to improve functional mobility and ambulation essential for optimal ADLs performance.    Baseline  Max A    Time  12    Period  Weeks    Status  Partially Met      OT LONG TERM GOAL #4   Title  Pt to tolerate RLE compression garments and HOS devices and be able to don and doff them with modified independence using assistive devices and extra time PRN to ensure optimal LT management over time.     Baseline  max A    Time  12    Period  Weeks    Status  On-going      OT LONG TERM GOAL #5   Title  Lymphedema (LE) Pain: Pt to report 25% reduction in RLE discomfort/ pain described as  "heaviness", "fullness", and tightness" by DC to improve ADLs performance, to increase leisure pursuits,  and to increase social participation and role performace.      Baseline  Max A    Time  12    Period  Weeks    Status  Partially Met      OT LONG TERM GOAL #6   Title  Lymphedema (LE) management/ self-care:  During Management Phase CDT Pt to sustain reduced limb volumes achieved during Intensive Phase CDT at follow up visits during Management Phase CDT within 3% using LE self-care home program components as directed ( simple self MLD, skin care, ther ex and daily/ nightly compression garments/ devices) .    Baseline  Max A    Time  6    Period  Months    Status   On-going            Plan - 09/08/17 1148    Clinical Impression Statement  Repeated RLE anatomical measurements at least ankle, widest calf and groin in effort to fit Pt  w/ less costly, thigh length compression garment. Once again, Pt's leg does not fit within size ranges for each landm,ark allowing her to fit in an off the shelf garment,. Called vendor and asked them to reduce order from 2 to 1 quantity. Provided MLD, skin care and compression wraps for remainder of session. Pt tolerated manual therapy without difficulty. Pt had single LOB episide while standing for wraps and sat down on bed. Cont as per POC.     Occupational Profile and client history currently impacting functional performance  uinl  L    Occupational performance deficits (Please refer to evaluation for details):  ADL's;IADL's;Work;Leisure;Social Participation;Other body image    Rehab Potential  Good    OT Frequency  2x / week    OT Treatment/Interventions  Self-care/ADL training;Therapeutic exercise;Manual Therapy;Manual lymph drainage;Therapeutic activities;Coping strategies training;DME and/or AE instruction;Compression bandaging;Patient/family education       Patient will benefit from skilled therapeutic intervention in order to improve the following deficits and impairments:  Increased edema, Decreased skin integrity, Decreased knowledge of use of DME, Impaired flexibility, Pain, Decreased mobility, Impaired sensation, Decreased activity tolerance, Decreased range of motion, Decreased balance, Decreased knowledge of precautions, Difficulty walking  Visit Diagnosis: Lymphedema, not elsewhere classified    Problem List Patient Active Problem List   Diagnosis Date Noted  . Pain and swelling of right lower leg 10/11/2016  . Anemia 09/11/2015  . UTI (urinary tract infection) due to Enterococcus 07/19/2015  . 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 Neuropathy 02/10/2015  . Midline cystocele 12/15/2014  . Vaginal atrophy  12/15/2014  . Status post hysterectomy with oophorectomy 12/15/2014  . Celiac disease 11/07/2014  . MI (mitral incompetence) 10/27/2014  . Infection of urinary tract 08/12/2014  . CD (celiac disease) 07/12/2014  . Mixed hyperlipidemia 04/12/2014  . Paroxysmal supraventricular tachycardia ([place]) 04/12/2014  . Thrombocythemia ([place]) 09/26/2013  . Vitamin D deficiency 09/26/2013  . Frequent UTI 09/26/2013  . Chest pain 07/28/2013  . Edema 07/06/2013  . Other symptoms involving urinary system 07/07/2012  . Female genuine stress incontinence 02/10/2012  . Bladder infection, chronic 10/19/2011  . Urge incontinence 10/19/2011  . Malignant neoplasm of right fallopian tube [facility]) 10/05/2009    Andrey Spearman, MS, OTR/L, [facility] 09/08/17 11:54 AM  [place] MAIN [facility] SERVICES [address] [place], [place], [address] Phone: [phone]   Fax:  [phone]  Name: Nicole Clayton MRN: [id number] Date of Birth: [birth year]

## 2017-09-11 ENCOUNTER — Ambulatory Visit: Payer: Medicare HMO | Admitting: Occupational Therapy

## 2017-09-11 DIAGNOSIS — I890 Lymphedema, not elsewhere classified: Secondary | ICD-10-CM | POA: Diagnosis not present

## 2017-09-11 NOTE — Therapy (Signed)
[place] MAIN [facility] SERVICES [address] [place], [place], [address] Phone: [phone]   Fax:  [phone]  Occupational Therapy Treatment  Patient Details  Name: Nicole Clayton MRN: [id number] Date of Birth: 10-09-1928 Referring Provider: [name], MD   Encounter Date: 09/11/2017  OT End of Session - 09/11/17 1136    Visit Number  14    Number of Visits  36    Date for OT Re-Evaluation  10/26/17    OT Start Time  1000    OT Stop Time  1105    OT Time Calculation (min)  65 min    Equipment Utilized During Treatment  1225    Activity Tolerance  Patient tolerated treatment well;No increased pain    Behavior During Therapy  WFL for tasks assessed/performed       Past Medical History:  Diagnosis Date  . Bladder infection, chronic 10/19/2011  . Cancer (HCC)    Ovarian   . 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 Claiborne Rigg and Delorise Shiner Choksi Overview:  Drs. Claiborne Rigg and [employer]   . Concussion   . GERD (gastroesophageal reflux disease)   . MI (mitral incompetence) 10/27/2014   Overview:  MODERATE   . Mitral valve prolapse   . OAB (overactive bladder)   . Ovarian cancer ([place])   . Pulmonary embolism ([place])   . Tachycardia     Past Surgical History:  Procedure Laterality Date  . ABDOMINAL HYSTERECTOMY    . CHOLECYSTECTOMY    . LAPAROSCOPIC BILATERAL SALPINGO OOPHERECTOMY  2011  . ovarian cancer surgery  2011    There were no vitals filed for this visit.  Subjective Assessment - 09/11/17 1136    Subjective   Nicole Clayton presents for OT visiit 14/36 for complete decongestive therapy to RLE. Pt is accompanied by her daughter, Nicole Clayton. Pt reports that her RLE custom compression stocking was ordered.    Patient is accompained by:  Family member    Currently in Pain?  Yes    Pain Location  Foot    Pain Orientation  Right;Left    Pain Type  Chronic pain;Neuropathic pain    Pain Onset  More than a month ago                   OT Treatments/Exercises (OP) - 09/11/17 0001      ADLs   ADL Education Given  Yes      Manual Therapy   Manual Therapy  Edema management;Compression Bandaging;Manual Lymphatic Drainage (MLD)    Manual therapy comments  skin care to RLE      during MLD w/ low ph castor oil    Manual Lymphatic Drainage (MLD)  MLD to RLE/ RLQ in supibne and sidelying utilizing short neck sequence at terminus, regional axillary watershed in supine, deep abdominal LNs and diaphram, and proximal to distal strokes to alll leg segments from groin to foot. Pt tolerated without difficulty.    Compression Bandaging  RLE full length gradient compression wrap applied as established.                         Plan - 09/11/17 1139    Clinical Impression Statement  RLE swelling is very well controlled today. Pt's family cosistently assists her with compression wraps and skin care between visits. Pt tolerated MLD, skin care and compression wrapping to RLE as  established today without difficulty. Custom RLE compression garment will be fit as soon as available from DME vendor. Unsure if LLE knee length OTS stocking has been ordered by family. Cont as per POC.    Occupational Profile and client history currently impacting functional performance  uinl  L    Occupational performance deficits (Please refer to evaluation for details):  ADL's;IADL's;Work;Leisure;Social Participation;Other    Rehab Potential  Good    OT Frequency  2x / week    OT Treatment/Interventions  Self-care/ADL training;Therapeutic exercise;Manual Therapy;Manual lymph drainage;Therapeutic activities;Coping strategies training;DME and/or AE instruction;Compression bandaging;Patient/family education       Patient will benefit from skilled therapeutic intervention in order to improve the following deficits and impairments:  Increased edema, Decreased skin integrity, Decreased  knowledge of use of DME, Impaired flexibility, Pain, Decreased mobility, Impaired sensation, Decreased activity tolerance, Decreased range of motion, Decreased balance, Decreased knowledge of precautions, Difficulty walking  Visit Diagnosis: Lymphedema, not elsewhere classified    Problem List Patient Active Problem List   Diagnosis Date Noted  . Pain and swelling of right lower leg 10/11/2016  . Anemia 09/11/2015  . UTI (urinary tract infection) due to Enterococcus 07/19/2015  . 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 Neuropathy 02/10/2015  . Midline cystocele 12/15/2014  . Vaginal atrophy 12/15/2014  . Status post hysterectomy with oophorectomy 12/15/2014  . Celiac disease 11/07/2014  . MI (mitral incompetence) 10/27/2014  . Infection of urinary tract 08/12/2014  . CD (celiac disease) 07/12/2014  . Mixed hyperlipidemia 04/12/2014  . Paroxysmal supraventricular tachycardia ([place]) 04/12/2014  . Thrombocythemia ([place]) 09/26/2013  . Vitamin D deficiency 09/26/2013  . Frequent UTI 09/26/2013  . Chest pain 07/28/2013  . Edema 07/06/2013  . Other symptoms involving urinary system 07/07/2012  . Female genuine stress incontinence 02/10/2012  . Bladder infection, chronic 10/19/2011  . Urge incontinence 10/19/2011  . Malignant neoplasm of right fallopian tube [facility]) 10/05/2009    Andrey Spearman, MS, OTR/L, [facility] 09/11/17 11:42 AM  [place] MAIN [facility] SERVICES [address] [place], [place], [address] Phone: [phone]   Fax:  [phone]  Name: Nicole Clayton MRN: [id number] Date of Birth: 1928/06/02

## 2017-09-15 ENCOUNTER — Encounter: Payer: Medicare HMO | Admitting: Occupational Therapy

## 2017-09-17 ENCOUNTER — Ambulatory Visit: Payer: Medicare HMO | Admitting: Occupational Therapy

## 2017-09-17 DIAGNOSIS — I890 Lymphedema, not elsewhere classified: Secondary | ICD-10-CM | POA: Diagnosis not present

## 2017-09-18 NOTE — Therapy (Signed)
[place] MAIN [facility] SERVICES [address] [place], [place], [address] Phone: [phone]   Fax:  [phone]  Occupational Therapy Treatment  Patient Details  Name: Nicole Clayton MRN: [id number] Date of Birth: 06-01-1928 Referring Provider: [name], MD   Encounter Date: 09/17/2017  OT End of Session - 09/18/17 1424    Visit Number  15    Number of Visits  36    Date for OT Re-Evaluation  10/26/17    OT Start Time  1105    OT Stop Time  1202    OT Time Calculation (min)  57 min    Equipment Utilized During Treatment  1225    Activity Tolerance  Patient tolerated treatment well;No increased pain    Behavior During Therapy  WFL for tasks assessed/performed       Past Medical History:  Diagnosis Date  . Bladder infection, chronic 10/19/2011  . Cancer (HCC)    Ovarian   . 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 Claiborne Rigg and Delorise Shiner Choksi Overview:  Drs. Claiborne Rigg and [employer]   . Concussion   . GERD (gastroesophageal reflux disease)   . MI (mitral incompetence) 10/27/2014   Overview:  MODERATE   . Mitral valve prolapse   . OAB (overactive bladder)   . Ovarian cancer ([place])   . Pulmonary embolism ([place])   . Tachycardia     Past Surgical History:  Procedure Laterality Date  . ABDOMINAL HYSTERECTOMY    . CHOLECYSTECTOMY    . LAPAROSCOPIC BILATERAL SALPINGO OOPHERECTOMY  2011  . ovarian cancer surgery  2011    There were no vitals filed for this visit.  Subjective Assessment - 09/18/17 1422    Subjective   [name] presents for OT visiit 15/36 for complete decongestive therapy to RLE. Pt is accompanied by her daughter, [name]. Pt c/o headache today, but no new complaints re LE.    Patient is accompained by:  Family member    Currently in Pain?  --   not rated numerically   Pain Descriptors / Indicators  Heaviness;Discomfort;Headache;Tightness;Tiring    Pain Type   Acute pain;Chronic pain    Pain Onset  More than a month ago                   OT Treatments/Exercises (OP) - 09/18/17 0001      Manual Therapy   Manual Therapy  Edema management;Compression Bandaging;Manual Lymphatic Drainage (MLD)    Manual therapy comments  skin care to RLE      during MLD w/ low ph castor oil    Manual Lymphatic Drainage (MLD)  MLD to RLE/ RLQ in supibne and sidelying utilizing short neck sequence at terminus, regional axillary watershed in supine, deep abdominal LNs and diaphram, and proximal to distal strokes to alll leg segments from groin to foot. Pt tolerated without difficulty.             OT Education - 09/17/17 1425    Education Details  Continued skilled Pt/caregiver education  And LE ADL training throughout visit for lymphedema self care/ home program, including compression wrapping, compression garment and device wear/care, lymphatic pumping ther ex, simple self-MLD, and skin care. Discussed progress towards goals.     Person(s) Educated  Patient;Child(ren)    Methods  Explanation;Demonstration    Comprehension  Verbalized understanding;Returned demonstration          OT Long  Term Goals - 08/28/17 1140      OT LONG TERM GOAL #1   Title  Pt independent w/ lymphedema precautions/prevention principals and using printed reference to limit LE progression and infection risk.    Baseline  Max A    Time  2    Period  Weeks    Status  Achieved      OT LONG TERM GOAL #2   Title  Lymphedema (LE) management/ self-care: Pt able to apply thigh length gradient compression wraps between OT visits with max A from caregiver using correct gradient techniques within 2 weeks to achieve optimal limb volume reduction during Intensive Phase of CDT and optimal self-management over time.    Baseline  Max A    Time  2    Period  Weeks    Status  Achieved      OT LONG TERM GOAL #3   Title  Lymphedema (LE) management/ self-care:  Pt to achieve at least  10%  limb volume RLE reduction during Intensive Phase CDT to decrease infection and falls risk, to limit LE progression and to improve functional mobility and ambulation essential for optimal ADLs performance.    Baseline  Max A    Time  12    Period  Weeks    Status  Partially Met      OT LONG TERM GOAL #4   Title  Pt to tolerate RLE compression garments and HOS devices and be able to don and doff them with modified independence using assistive devices and extra time PRN to ensure optimal LT management over time.     Baseline  max A    Time  12    Period  Weeks    Status  On-going      OT LONG TERM GOAL #5   Title  Lymphedema (LE) Pain: Pt to report 25% reduction in RLE discomfort/ pain described as  "heaviness", "fullness", and tightness" by DC to improve ADLs performance, to increase leisure pursuits,  and to increase social participation and role performace.      Baseline  Max A    Time  12    Period  Weeks    Status  Partially Met      OT LONG TERM GOAL #6   Title  Lymphedema (LE) management/ self-care:  During Management Phase CDT Pt to sustain reduced limb volumes achieved during Intensive Phase CDT at follow up visits during Management Phase CDT within 3% using LE self-care home program components as directed ( simple self MLD, skin care, ther ex and daily/ nightly compression garments/ devices) .    Baseline  Max A    Time  6    Period  Months    Status  On-going            Plan - 09/17/17 1426    Clinical Impression Statement  Pt feeling unwell tpday headach, so did not provide Pt edu for Le self care and did not apply wraps in clini aftter MLD to RLE. Emphasis of session on MLD to RLE. Daughter will apply wraps when Pt returns home. Cont as per POC. Fit compression garments ASAP and decrease Rx frequency.    Occupational Profile and client history currently impacting functional performance  uinl  L    Occupational performance deficits (Please refer to evaluation for  details):  ADL's;IADL's;Work;Leisure;Social Participation;Other   body image   Rehab Potential  Good    OT Frequency  2x / week  OT Treatment/Interventions  Self-care/ADL training;Therapeutic exercise;Manual Therapy;Manual lymph drainage;Therapeutic activities;Coping strategies training;DME and/or AE instruction;Compression bandaging;Patient/family education       Patient will benefit from skilled therapeutic intervention in order to improve the following deficits and impairments:  Increased edema, Decreased skin integrity, Decreased knowledge of use of DME, Impaired flexibility, Pain, Decreased mobility, Impaired sensation, Decreased activity tolerance, Decreased range of motion, Decreased balance, Decreased knowledge of precautions, Difficulty walking  Visit Diagnosis: Lymphedema, not elsewhere classified    Problem List Patient Active Problem List   Diagnosis Date Noted  . Pain and swelling of right lower leg 10/11/2016  . Anemia 09/11/2015  . UTI (urinary tract infection) due to Enterococcus 07/19/2015  . 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 Neuropathy 02/10/2015  . Midline cystocele 12/15/2014  . Vaginal atrophy 12/15/2014  . Status post hysterectomy with oophorectomy 12/15/2014  . Celiac disease 11/07/2014  . MI (mitral incompetence) 10/27/2014  . Infection of urinary tract 08/12/2014  . CD (celiac disease) 07/12/2014  . Mixed hyperlipidemia 04/12/2014  . Paroxysmal supraventricular tachycardia ([place]) 04/12/2014  . Thrombocythemia ([place]) 09/26/2013  . Vitamin D deficiency 09/26/2013  . Frequent UTI 09/26/2013  . Chest pain 07/28/2013  . Edema 07/06/2013  . Other symptoms involving urinary system 07/07/2012  . Female genuine stress incontinence 02/10/2012  . Bladder infection, chronic 10/19/2011  . Urge incontinence 10/19/2011  . Malignant neoplasm of right fallopian tube [facility]) 10/05/2009    Andrey Spearman, MS, OTR/L, [facility] 09/18/17 2:29 PM   [place] MAIN [facility] SERVICES [address] [place], [place], [address] Phone: [phone]   Fax:  [phone]  Name: Nicole Clayton MRN: [id number] Date of Birth: [birth year]

## 2017-09-22 ENCOUNTER — Encounter: Payer: Medicare HMO | Admitting: Occupational Therapy

## 2017-09-25 DIAGNOSIS — N183 Chronic kidney disease, stage 3 (moderate): Secondary | ICD-10-CM | POA: Diagnosis not present

## 2017-09-25 DIAGNOSIS — I10 Essential (primary) hypertension: Secondary | ICD-10-CM | POA: Diagnosis not present

## 2017-09-25 DIAGNOSIS — I471 Supraventricular tachycardia: Secondary | ICD-10-CM | POA: Diagnosis not present

## 2017-09-25 DIAGNOSIS — E1122 Type 2 diabetes mellitus with diabetic chronic kidney disease: Secondary | ICD-10-CM | POA: Diagnosis not present

## 2017-09-25 DIAGNOSIS — G459 Transient cerebral ischemic attack, unspecified: Secondary | ICD-10-CM | POA: Diagnosis not present

## 2017-09-30 ENCOUNTER — Ambulatory Visit: Payer: Medicare HMO | Admitting: Occupational Therapy

## 2017-09-30 DIAGNOSIS — I890 Lymphedema, not elsewhere classified: Secondary | ICD-10-CM

## 2017-09-30 NOTE — Therapy (Signed)
[place] MAIN [facility] SERVICES [address] [place], [place], [address] Phone: [phone]   Fax:  [phone]  Occupational Therapy Treatment  Patient Details  Name: Nicole Clayton MRN: [id number] Date of Birth: 1928/11/04 Referring Provider: [name], MD   Encounter Date: 09/30/2017  OT End of Session - 09/30/17 1518    Visit Number  16    Number of Visits  36    Date for OT Re-Evaluation  10/26/17    OT Start Time  0100    OT Stop Time  0200    OT Time Calculation (min)  60 min    Equipment Utilized During Treatment  1225    Activity Tolerance  Patient tolerated treatment well;No increased pain    Behavior During Therapy  WFL for tasks assessed/performed       Past Medical History:  Diagnosis Date  . Bladder infection, chronic 10/19/2011  . Cancer (HCC)    Ovarian   . 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 Claiborne Rigg and Delorise Shiner Choksi Overview:  Drs. Claiborne Rigg and [employer]   . Concussion   . GERD (gastroesophageal reflux disease)   . MI (mitral incompetence) 10/27/2014   Overview:  MODERATE   . Mitral valve prolapse   . OAB (overactive bladder)   . Ovarian cancer ([place])   . Pulmonary embolism ([place])   . Tachycardia     Past Surgical History:  Procedure Laterality Date  . ABDOMINAL HYSTERECTOMY    . CHOLECYSTECTOMY    . LAPAROSCOPIC BILATERAL SALPINGO OOPHERECTOMY  2011  . ovarian cancer surgery  2011    There were no vitals filed for this visit.  Subjective Assessment - 09/30/17 1515    Subjective   Nicole Clayton presents for OT visiit 16/36 for complete decongestive therapy to RLE. Pt is accompanied by her daughter, [name]. Pt brings new custom RLE compression garment to session for fitting.    Patient is accompained by:  Family member    Currently in Pain?  Yes    Pain Location  Foot    Pain Orientation  Right;Left    Pain Descriptors / Indicators  Numbness     Pain Onset  More than a month ago                   OT Treatments/Exercises (OP) - 09/30/17 0001      ADLs   ADL Education Given  Yes      Manual Therapy   Manual Therapy  Edema management    Manual therapy comments  fitting ad assessment RLE custom Elvarex Soft compression sticking- thiigh length.              OT Education - 09/30/17 1517    Education Details  Pt and CG edu for donning and doffing custom compression garment using assistive devices ( friction gloves and tyvek sock). Edu for fit and function, and wear and care regimes. Also discussed  remake process.    Person(s) Educated  Patient;Child(ren)    Methods  Explanation;Demonstration    Comprehension  Verbalized understanding;Returned demonstration          OT Long Term Goals - 08/28/17 1140      OT LONG TERM GOAL #1   Title  Pt independent w/ lymphedema precautions/prevention principals and using printed reference to limit LE progression and infection risk.    Baseline  Max A  Time  2    Period  Weeks    Status  Achieved      OT LONG TERM GOAL #2   Title  Lymphedema (LE) management/ self-care: Pt able to apply thigh length gradient compression wraps between OT visits with max A from caregiver using correct gradient techniques within 2 weeks to achieve optimal limb volume reduction during Intensive Phase of CDT and optimal self-management over time.    Baseline  Max A    Time  2    Period  Weeks    Status  Achieved      OT LONG TERM GOAL #3   Title  Lymphedema (LE) management/ self-care:  Pt to achieve at least 10%  limb volume RLE reduction during Intensive Phase CDT to decrease infection and falls risk, to limit LE progression and to improve functional mobility and ambulation essential for optimal ADLs performance.    Baseline  Max A    Time  12    Period  Weeks    Status  Partially Met      OT LONG TERM GOAL #4   Title  Pt to tolerate RLE compression garments and HOS devices and  be able to don and doff them with modified independence using assistive devices and extra time PRN to ensure optimal LT management over time.     Baseline  max A    Time  12    Period  Weeks    Status  On-going      OT LONG TERM GOAL #5   Title  Lymphedema (LE) Pain: Pt to report 25% reduction in RLE discomfort/ pain described as  "heaviness", "fullness", and tightness" by DC to improve ADLs performance, to increase leisure pursuits,  and to increase social participation and role performace.      Baseline  Max A    Time  12    Period  Weeks    Status  Partially Met      OT LONG TERM GOAL #6   Title  Lymphedema (LE) management/ self-care:  During Management Phase CDT Pt to sustain reduced limb volumes achieved during Intensive Phase CDT at follow up visits during Management Phase CDT within 3% using LE self-care home program components as directed ( simple self MLD, skin care, ther ex and daily/ nightly compression garments/ devices) .    Baseline  Max A    Time  6    Period  Months    Status  On-going            Plan - 09/30/17 1519    Clinical Impression Statement  Fit RLE , custom, thigh length, [employer] Elvarex SOFT , ccl 2 compression garment. Stocking fits well  at landmarks, but is too short at top edge. It also appears to be a bit too snug at foot opening. Pt will wear garment dailly , wash   it, and return later  this week for final assessment and, I anticipate, remake measurements. Daughter able to domn garment using  AE after skilled edu with mod A. She will train residence staff       to assist daily with donning and doffing. Pt reports comfort in garments. Cont as per POC.    Occupational Profile and client history currently impacting functional performance  uinl  L    Occupational performance deficits (Please refer to evaluation for details):  ADL's;IADL's;Work;Leisure;Social Participation;Other   body image   Rehab Potential  Good    OT Frequency  2x / week    OT  Treatment/Interventions  Self-care/ADL training;Therapeutic exercise;Manual Therapy;Manual lymph drainage;Therapeutic activities;Coping strategies training;DME and/or AE instruction;Compression bandaging;Patient/family education       Patient will benefit from skilled therapeutic intervention in order to improve the following deficits and impairments:  Increased edema, Decreased skin integrity, Decreased knowledge of use of DME, Impaired flexibility, Pain, Decreased mobility, Impaired sensation, Decreased activity tolerance, Decreased range of motion, Decreased balance, Decreased knowledge of precautions, Difficulty walking  Visit Diagnosis: Lymphedema, not elsewhere classified    Problem List Patient Active Problem List   Diagnosis Date Noted  . Pain and swelling of right lower leg 10/11/2016  . Anemia 09/11/2015  . UTI (urinary tract infection) due to Enterococcus 07/19/2015  . 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 Neuropathy 02/10/2015  . Midline cystocele 12/15/2014  . Vaginal atrophy 12/15/2014  . Status post hysterectomy with oophorectomy 12/15/2014  . Celiac disease 11/07/2014  . MI (mitral incompetence) 10/27/2014  . Infection of urinary tract 08/12/2014  . CD (celiac disease) 07/12/2014  . Mixed hyperlipidemia 04/12/2014  . Paroxysmal supraventricular tachycardia ([place]) 04/12/2014  . Thrombocythemia ([place]) 09/26/2013  . Vitamin D deficiency 09/26/2013  . Frequent UTI 09/26/2013  . Chest pain 07/28/2013  . Edema 07/06/2013  . Other symptoms involving urinary system 07/07/2012  . Female genuine stress incontinence 02/10/2012  . Bladder infection, chronic 10/19/2011  . Urge incontinence 10/19/2011  . Malignant neoplasm of right fallopian tube [facility]) 10/05/2009   Andrey Spearman, MS, OTR/L, [facility] 09/30/17 3:23 PM   [place] MAIN [facility] SERVICES [address] [place], [place], [address] Phone: [phone]   Fax:  [phone]  Name:  Nicole Clayton MRN: [id number] Date of Birth: [birth year]

## 2017-10-02 ENCOUNTER — Ambulatory Visit: Payer: Medicare HMO | Admitting: Occupational Therapy

## 2017-10-02 DIAGNOSIS — I890 Lymphedema, not elsewhere classified: Secondary | ICD-10-CM | POA: Diagnosis not present

## 2017-10-02 NOTE — Patient Instructions (Signed)

## 2017-10-02 NOTE — Therapy (Signed)
[place] MAIN [facility] SERVICES [address] [place], [place], [address] Phone: [phone]   Fax:  [phone]  Occupational Therapy Treatment  Patient Details  Name: Nicole Clayton MRN: [id number] Date of Birth: October 21, 1928 Referring Provider: [name], MD   Encounter Date: 10/02/2017  OT End of Session - 10/02/17 1601    Visit Number  17    Number of Visits  36    Date for OT Re-Evaluation  10/26/17    OT Start Time  0300    OT Stop Time  0405    OT Time Calculation (min)  65 min    Equipment Utilized During Treatment  1225    Activity Tolerance  Patient tolerated treatment well;No increased pain    Behavior During Therapy  WFL for tasks assessed/performed       Past Medical History:  Diagnosis Date  . Bladder infection, chronic 10/19/2011  . Cancer (HCC)    Ovarian   . 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 Claiborne Rigg and Delorise Shiner Choksi Overview:  Drs. Claiborne Rigg and [employer]   . Concussion   . GERD (gastroesophageal reflux disease)   . MI (mitral incompetence) 10/27/2014   Overview:  MODERATE   . Mitral valve prolapse   . OAB (overactive bladder)   . Ovarian cancer ([place])   . Pulmonary embolism ([place])   . Tachycardia     Past Surgical History:  Procedure Laterality Date  . ABDOMINAL HYSTERECTOMY    . CHOLECYSTECTOMY    . LAPAROSCOPIC BILATERAL SALPINGO OOPHERECTOMY  2011  . ovarian cancer surgery  2011    There were no vitals filed for this visit.  Subjective Assessment - 10/02/17 1555    Subjective   Nicole Clayton presents for OT visiit 17/36 for complete decongestive therapy to RLE. Pt is accompanied by her daughter, Nicole Clayton. Pt presents with custom RLE compression garment in place incorrectly. Garment has fallen down from top edge causing leg below knee to swell.     Patient is accompained by:  Family member    Currently in Pain?  Yes   Not rated numerically    Pain Location  Foot    Pain Orientation  Right;Left    Pain Descriptors / Indicators  Numbness    Pain Type  Neuropathic pain;Chronic pain    Pain Onset  More than a month ago    Effect of Pain on Daily Activities  increased falls risk                   OT Treatments/Exercises (OP) - 10/02/17 0001      Manual Therapy   Manual Therapy  Edema management    Manual therapy comments  fitting and assessment for LLE compression knee high.    Edema Management  remeasurement for LLE thigh high custom compression garment to increase length at top edge and circumference at foot  (Ca)             OT Education - 10/02/17 1600    Education Details  Continued skilled Pt/caregiver education  re correct techniques for donning and doffing custom compression garments using assistive devices, including Tyvek paper sleeve and friction gloves.    Person(s) Educated  Patient;Child(ren)    Methods  Explanation;Demonstration    Comprehension  Verbalized understanding;Returned demonstration          OT Long Term Goals - 08/28/17 1140  OT LONG TERM GOAL #1   Title  Pt independent w/ lymphedema precautions/prevention principals and using printed reference to limit LE progression and infection risk.    Baseline  Max A    Time  2    Period  Weeks    Status  Achieved      OT LONG TERM GOAL #2   Title  Lymphedema (LE) management/ self-care: Pt able to apply thigh length gradient compression wraps between OT visits with max A from caregiver using correct gradient techniques within 2 weeks to achieve optimal limb volume reduction during Intensive Phase of CDT and optimal self-management over time.    Baseline  Max A    Time  2    Period  Weeks    Status  Achieved      OT LONG TERM GOAL #3   Title  Lymphedema (LE) management/ self-care:  Pt to achieve at least 10%  limb volume RLE reduction during Intensive Phase CDT to decrease infection and falls risk, to limit LE progression and  to improve functional mobility and ambulation essential for optimal ADLs performance.    Baseline  Max A    Time  12    Period  Weeks    Status  Partially Met      OT LONG TERM GOAL #4   Title  Pt to tolerate RLE compression garments and HOS devices and be able to don and doff them with modified independence using assistive devices and extra time PRN to ensure optimal LT management over time.     Baseline  max A    Time  12    Period  Weeks    Status  On-going      OT LONG TERM GOAL #5   Title  Lymphedema (LE) Pain: Pt to report 25% reduction in RLE discomfort/ pain described as  "heaviness", "fullness", and tightness" by DC to improve ADLs performance, to increase leisure pursuits,  and to increase social participation and role performace.      Baseline  Max A    Time  12    Period  Weeks    Status  Partially Met      OT LONG TERM GOAL #6   Title  Lymphedema (LE) management/ self-care:  During Management Phase CDT Pt to sustain reduced limb volumes achieved during Intensive Phase CDT at follow up visits during Management Phase CDT within 3% using LE self-care home program components as directed ( simple self MLD, skin care, ther ex and daily/ nightly compression garments/ devices) .    Baseline  Max A    Time  6    Period  Months    Status  On-going            Plan - 10/02/17 1606    Clinical Impression Statement  Completed remeasurements for RLE custom thigh length compression garment to improve fit and function. INcreased length at G by 3 cm and increased cA circumference. Provided patient and caregiver education for donning and doffing compression garments using correct techniques and proper assistive devices. Pt arrived today with RLE garment improperly positioned. In order to ensure compression garments are aligned correctly for optimal effectiveness. Thigh garment top edge should be positionined at proximal thigh  adjacent to groin. Fold stocking in half so foot portion is  visible and seam is straight up the bacck of the thigh length garment. Put Tyvek paper sock on foot and friction gloves on CG hands  Slip garment over  Tyvek sock until heel is in correct position. Use gloves to distribute fabric to the knee, then as Pt is standing and holding onto a support to ensure she does not fall, pull remaining garment fabric up over thigh  into correct position at top of leg adjacent to groin. Compression garments she not be slept in ever. Remove garments after 8 PM when Pt is ready to retire for the night.  Pt will return for remake fitting. Nicole Clayton, Pt's daughter, will share doning and doffning instruction with nursing staff and nursing assistants at Pt's residential facility.    Occupational performance deficits (Please refer to evaluation for details):  ADL's;IADL's;Work;Leisure;Social Participation;Other   body image   Rehab Potential  Good    OT Frequency  2x / week    OT Treatment/Interventions  Self-care/ADL training;Therapeutic exercise;Manual Therapy;Manual lymph drainage;Therapeutic activities;Coping strategies training;DME and/or AE instruction;Compression bandaging;Patient/family education       Patient will benefit from skilled therapeutic intervention in order to improve the following deficits and impairments:  Increased edema, Decreased skin integrity, Decreased knowledge of use of DME, Impaired flexibility, Pain, Decreased mobility, Impaired sensation, Decreased activity tolerance, Decreased range of motion, Decreased balance, Decreased knowledge of precautions, Difficulty walking  Visit Diagnosis: Lymphedema, not elsewhere classified    Problem List Patient Active Problem List   Diagnosis Date Noted  . Pain and swelling of right lower leg 10/11/2016  . Anemia 09/11/2015  . UTI (urinary tract infection) due to Enterococcus 07/19/2015  . 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 Neuropathy 02/10/2015  . Midline cystocele 12/15/2014  . Vaginal atrophy 12/15/2014   . Status post hysterectomy with oophorectomy 12/15/2014  . Celiac disease 11/07/2014  . MI (mitral incompetence) 10/27/2014  . Infection of urinary tract 08/12/2014  . CD (celiac disease) 07/12/2014  . Mixed hyperlipidemia 04/12/2014  . Paroxysmal supraventricular tachycardia ([place]) 04/12/2014  . Thrombocythemia ([place]) 09/26/2013  . Vitamin D deficiency 09/26/2013  . Frequent UTI 09/26/2013  . Chest pain 07/28/2013  . Edema 07/06/2013  . Other symptoms involving urinary system 07/07/2012  . Female genuine stress incontinence 02/10/2012  . Bladder infection, chronic 10/19/2011  . Urge incontinence 10/19/2011  . Malignant neoplasm of right fallopian tube [facility]) 10/05/2009   Nicole Spearman, MS, OTR/L, [facility] 10/02/17 4:18 PM  [place] MAIN [facility] SERVICES [address] [place], [place], [address] Phone: [phone]   Fax:  [phone]  Name: Nicole Clayton MRN: [id number] Date of Birth: 10-01-1928

## 2017-10-08 ENCOUNTER — Encounter: Payer: Medicare HMO | Admitting: Occupational Therapy

## 2017-10-15 ENCOUNTER — Ambulatory Visit: Payer: Medicare HMO | Attending: Internal Medicine | Admitting: Occupational Therapy

## 2017-10-15 DIAGNOSIS — I890 Lymphedema, not elsewhere classified: Secondary | ICD-10-CM | POA: Insufficient documentation

## 2017-10-15 NOTE — Therapy (Signed)
[place] MAIN [facility] SERVICES [address] [place], [place], [address] Phone: [phone]   Fax:  [phone]  Occupational Therapy Treatment Note and Progress Report  Patient Details  Name: Nicole Clayton MRN: [id number] Date of Birth: [birth year] Referring Provider: [name], MD   Encounter Date: 10/15/2017  OT End of Session - 10/15/17 1114    Visit Number  17    Number of Visits  36    Date for OT Re-Evaluation  10/26/17    OT Start Time  1111    OT Stop Time  1215    OT Time Calculation (min)  64 min    Equipment Utilized During Treatment  1225    Activity Tolerance  Patient tolerated treatment well;No increased pain    Behavior During Therapy  WFL for tasks assessed/performed       Past Medical History:  Diagnosis Date  . Bladder infection, chronic 10/19/2011  . Cancer (HCC)    Ovarian   . 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 Claiborne Rigg and Delorise Shiner Choksi Overview:  Drs. Claiborne Rigg and [employer]   . Concussion   . GERD (gastroesophageal reflux disease)   . MI (mitral incompetence) 10/27/2014   Overview:  MODERATE   . Mitral valve prolapse   . OAB (overactive bladder)   . Ovarian cancer ([place])   . Pulmonary embolism ([place])   . Tachycardia     Past Surgical History:  Procedure Laterality Date  . ABDOMINAL HYSTERECTOMY    . CHOLECYSTECTOMY    . LAPAROSCOPIC BILATERAL SALPINGO OOPHERECTOMY  2011  . ovarian cancer surgery  2011    There were no vitals filed for this visit.  Subjective Assessment - 10/15/17 1230    Subjective   Nicole Clayton presents for OT visiit 18/36 as she transitioned to Management Phase CDT to address RLE cancer -related lymphedema. Pt is accompanied by her daughter, [name]. Pt brings remade custom compression garment to clinic for fitting and  assessment.  Pt reports she is taking garments off at night by herself now without aid of assistive  devices.    Patient is accompained by:  Family member    Currently in Pain?  No/denies    Pain Onset  More than a month ago                   OT Treatments/Exercises (OP) - 10/15/17 0001      ADLs   ADL Education Given  Yes      Manual Therapy   Manual Therapy  Manual Lymphatic Drainage (MLD);Other (comment);Edema management;Myofascial release;Taping;Neural Stretch    Manual therapy comments  fitting and assessment for RLE compression knee high.             OT Education - 10/15/17 1234    Education Details  Continued skilled Pt/caregiver education  re correct techniques for donning and doffing custom compression garments using assistive devices, including Tyvek paper sleeve and friction gloves.    Person(s) Educated  Patient;Child(ren)    Methods  Explanation;Demonstration    Comprehension  Verbalized understanding;Returned demonstration          OT Long Term Goals - 10/15/17 1241      OT LONG TERM GOAL #1   Title  Pt independent w/ lymphedema precautions/prevention principals and using printed reference to limit LE progression and infection risk.    Baseline  Max A  Time  2    Period  Weeks    Status  Achieved      OT LONG TERM GOAL #2   Title  Lymphedema (LE) management/ self-care: Pt able to apply thigh length gradient compression wraps between OT visits with max A from caregiver using correct gradient techniques within 2 weeks to achieve optimal limb volume reduction during Intensive Phase of CDT and optimal self-management over time.    Baseline  Max A    Time  2    Period  Weeks    Status  Achieved      OT LONG TERM GOAL #3   Title  Lymphedema (LE) management/ self-care:  Pt to achieve at least 10%  limb volume RLE reduction during Intensive Phase CDT to decrease infection and falls risk, to limit LE progression and to improve functional mobility and ambulation essential for optimal ADLs performance.    Baseline  Max A    Time  12    Period   Weeks    Status  Partially Met      OT LONG TERM GOAL #4   Title  Pt to tolerate RLE compression garments and HOS devices and be able to doff them with modified independence using assistive devices and extra time PRN to ensure optimal LT management over time.     Baseline  max A    Time  12    Period  Weeks    Status  Achieved      OT LONG TERM GOAL #5   Title  Lymphedema (LE) Pain: Pt to report 25% reduction in RLE discomfort/ pain described as  "heaviness", "fullness", and tightness" by DC to improve ADLs performance, to increase leisure pursuits,  and to increase social participation and role performace.      Baseline  Max A    Time  12    Period  Weeks    Status  Achieved      OT LONG TERM GOAL #6   Title  Lymphedema (LE) management/ self-care:  During Management Phase CDT Pt to sustain reduced limb volumes achieved during Intensive Phase CDT at follow up visits during Management Phase CDT within 3% using LE self-care home program components as directed ( simple self MLD, skin care, ther ex and daily/ nightly compression garments/ devices) .    Baseline  Max A    Time  6    Period  Months    Status  On-going            Plan - 10/15/17 1236    Clinical Impression Statement  Fitted remade RLE custom compression thigh high. Garment length is much improved and ankle fit at B landmark is correct now. Garment is much easier to don and doff with adjustmens and garment top edge stay in place vs falling  down and bunching at  politeal fossa. Pt  is now able to doff stockings independently. She is getting healp from CNA staff at her residential facility, but she feels they do not get garment hpositioned high enough. Daughter plans to discuss addidtional training with nursing supervisir to ensure additional staff training. Pt presents with additional swelling today as she did not don garment this morning before her 11 AM apointment. Encouraged Pt to don garment first thing in the morning  whenever possible. Provided MLD in supine with decreased tissue density and volume reduction  noted below the knee at the end of session. Donned compression garments w/ max A to conserve  Rx time. Pt agrees with plan to return for FU in 3 months. Sjhe will call during the interval PRN.    Occupational performance deficits (Please refer to evaluation for details):  ADL's;IADL's;Work;Leisure;Social Participation;Other   body image   Rehab Potential  Good    OT Frequency  2x / week    OT Treatment/Interventions  Self-care/ADL training;Therapeutic exercise;Manual Therapy;Manual lymph drainage;Therapeutic activities;Coping strategies training;DME and/or AE instruction;Compression bandaging;Patient/family education       Patient will benefit from skilled therapeutic intervention in order to improve the following deficits and impairments:  Increased edema, Decreased skin integrity, Decreased knowledge of use of DME, Impaired flexibility, Pain, Decreased mobility, Impaired sensation, Decreased activity tolerance, Decreased range of motion, Decreased balance, Decreased knowledge of precautions, Difficulty walking  Visit Diagnosis: Lymphedema, not elsewhere classified    Problem List Patient Active Problem List   Diagnosis Date Noted  . Pain and swelling of right lower leg 10/11/2016  . Anemia 09/11/2015  . UTI (urinary tract infection) due to Enterococcus 07/19/2015  . 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 Neuropathy 02/10/2015  . Midline cystocele 12/15/2014  . Vaginal atrophy 12/15/2014  . Status post hysterectomy with oophorectomy 12/15/2014  . Celiac disease 11/07/2014  . MI (mitral incompetence) 10/27/2014  . Infection of urinary tract 08/12/2014  . CD (celiac disease) 07/12/2014  . Mixed hyperlipidemia 04/12/2014  . Paroxysmal supraventricular tachycardia ([place]) 04/12/2014  . Thrombocythemia ([place]) 09/26/2013  . Vitamin D deficiency 09/26/2013  . Frequent UTI 09/26/2013  . Chest pain  07/28/2013  . Edema 07/06/2013  . Other symptoms involving urinary system 07/07/2012  . Female genuine stress incontinence 02/10/2012  . Bladder infection, chronic 10/19/2011  . Urge incontinence 10/19/2011  . Malignant neoplasm of right fallopian tube [facility]) 10/05/2009    Andrey Spearman, MS, OTR/L, [facility] 10/15/17 12:44 PM  [place] MAIN [facility] SERVICES [address] [place], [place], [address] Phone: [phone]   Fax:  [phone]  Name: ERIENNE SPELMAN MRN: [id number] Date of Birth: [birth year]

## 2017-10-17 ENCOUNTER — Encounter: Payer: Self-pay | Admitting: *Deleted

## 2017-10-21 ENCOUNTER — Encounter: Payer: Self-pay | Admitting: Emergency Medicine

## 2017-10-21 ENCOUNTER — Other Ambulatory Visit: Payer: Self-pay

## 2017-10-21 ENCOUNTER — Observation Stay
Admission: EM | Admit: 2017-10-21 | Discharge: 2017-10-22 | Disposition: A | Discharge status: Home / Self Care | Payer: Medicare HMO | Attending: Internal Medicine | Admitting: Internal Medicine

## 2017-10-21 ENCOUNTER — Ambulatory Visit
Admission: RE | Admit: 2017-10-21 | Discharge: 2017-10-21 | Disposition: A | Discharge status: Home / Self Care | Payer: Medicare HMO | Source: Ambulatory Visit | Attending: Internal Medicine | Admitting: Internal Medicine

## 2017-10-21 ENCOUNTER — Inpatient Hospital Stay: Payer: Medicare HMO | Attending: Internal Medicine

## 2017-10-21 ENCOUNTER — Emergency Department: Payer: Medicare HMO

## 2017-10-21 ENCOUNTER — Other Ambulatory Visit: Payer: Self-pay | Admitting: *Deleted

## 2017-10-21 DIAGNOSIS — Z9889 Other specified postprocedural states: Secondary | ICD-10-CM | POA: Insufficient documentation

## 2017-10-21 DIAGNOSIS — Z8544 Personal history of malignant neoplasm of other female genital organs: Secondary | ICD-10-CM | POA: Insufficient documentation

## 2017-10-21 DIAGNOSIS — K449 Diaphragmatic hernia without obstruction or gangrene: Secondary | ICD-10-CM | POA: Diagnosis not present

## 2017-10-21 DIAGNOSIS — R51 Headache: Secondary | ICD-10-CM | POA: Diagnosis not present

## 2017-10-21 DIAGNOSIS — I6782 Cerebral ischemia: Secondary | ICD-10-CM | POA: Insufficient documentation

## 2017-10-21 DIAGNOSIS — Z90710 Acquired absence of both cervix and uterus: Secondary | ICD-10-CM | POA: Diagnosis not present

## 2017-10-21 DIAGNOSIS — G319 Degenerative disease of nervous system, unspecified: Secondary | ICD-10-CM | POA: Diagnosis not present

## 2017-10-21 DIAGNOSIS — I890 Lymphedema, not elsewhere classified: Secondary | ICD-10-CM | POA: Diagnosis not present

## 2017-10-21 DIAGNOSIS — Z86711 Personal history of pulmonary embolism: Secondary | ICD-10-CM | POA: Diagnosis not present

## 2017-10-21 DIAGNOSIS — M6281 Muscle weakness (generalized): Secondary | ICD-10-CM | POA: Diagnosis not present

## 2017-10-21 DIAGNOSIS — Z79899 Other long term (current) drug therapy: Secondary | ICD-10-CM | POA: Insufficient documentation

## 2017-10-21 DIAGNOSIS — Z9049 Acquired absence of other specified parts of digestive tract: Secondary | ICD-10-CM | POA: Diagnosis not present

## 2017-10-21 DIAGNOSIS — Z66 Do not resuscitate: Secondary | ICD-10-CM | POA: Diagnosis not present

## 2017-10-21 DIAGNOSIS — Z7901 Long term (current) use of anticoagulants: Secondary | ICD-10-CM | POA: Diagnosis not present

## 2017-10-21 DIAGNOSIS — Z923 Personal history of irradiation: Secondary | ICD-10-CM | POA: Diagnosis not present

## 2017-10-21 DIAGNOSIS — N3281 Overactive bladder: Secondary | ICD-10-CM | POA: Insufficient documentation

## 2017-10-21 DIAGNOSIS — G459 Transient cerebral ischemic attack, unspecified: Secondary | ICD-10-CM | POA: Diagnosis not present

## 2017-10-21 DIAGNOSIS — R29898 Other symptoms and signs involving the musculoskeletal system: Secondary | ICD-10-CM | POA: Diagnosis not present

## 2017-10-21 DIAGNOSIS — C5701 Malignant neoplasm of right fallopian tube: Secondary | ICD-10-CM | POA: Diagnosis not present

## 2017-10-21 DIAGNOSIS — Z888 Allergy status to other drugs, medicaments and biological substances status: Secondary | ICD-10-CM | POA: Diagnosis not present

## 2017-10-21 DIAGNOSIS — Z8543 Personal history of malignant neoplasm of ovary: Secondary | ICD-10-CM | POA: Diagnosis not present

## 2017-10-21 DIAGNOSIS — K219 Gastro-esophageal reflux disease without esophagitis: Secondary | ICD-10-CM | POA: Diagnosis not present

## 2017-10-21 DIAGNOSIS — I2699 Other pulmonary embolism without acute cor pulmonale: Secondary | ICD-10-CM | POA: Diagnosis not present

## 2017-10-21 DIAGNOSIS — R531 Weakness: Secondary | ICD-10-CM | POA: Diagnosis not present

## 2017-10-21 DIAGNOSIS — Z882 Allergy status to sulfonamides status: Secondary | ICD-10-CM | POA: Diagnosis not present

## 2017-10-21 DIAGNOSIS — I10 Essential (primary) hypertension: Secondary | ICD-10-CM | POA: Insufficient documentation

## 2017-10-21 DIAGNOSIS — Z8041 Family history of malignant neoplasm of ovary: Secondary | ICD-10-CM | POA: Diagnosis not present

## 2017-10-21 DIAGNOSIS — Z7951 Long term (current) use of inhaled steroids: Secondary | ICD-10-CM | POA: Diagnosis not present

## 2017-10-21 DIAGNOSIS — C774 Secondary and unspecified malignant neoplasm of inguinal and lower limb lymph nodes: Secondary | ICD-10-CM | POA: Diagnosis not present

## 2017-10-21 DIAGNOSIS — Z90722 Acquired absence of ovaries, bilateral: Secondary | ICD-10-CM | POA: Diagnosis not present

## 2017-10-21 LAB — URINALYSIS, ROUTINE W REFLEX MICROSCOPIC
BILIRUBIN URINE: NEGATIVE
GLUCOSE, UA: NEGATIVE mg/dL
Hgb urine dipstick: NEGATIVE
KETONES UR: NEGATIVE mg/dL
Leukocytes, UA: NEGATIVE
NITRITE: NEGATIVE
PH: 8 (ref 5.0–8.0)
Protein, ur: NEGATIVE mg/dL
Specific Gravity, Urine: 1.01 (ref 1.005–1.030)

## 2017-10-21 LAB — PROTIME-INR
INR: 1.23
Prothrombin Time: 15.4 seconds — ABNORMAL HIGH (ref 11.4–15.2)

## 2017-10-21 LAB — CBC
HCT: 33.6 % — ABNORMAL LOW (ref 35.0–47.0)
Hemoglobin: 11.4 g/dL — ABNORMAL LOW (ref 12.0–16.0)
MCH: 31.7 pg (ref 26.0–34.0)
MCHC: 34 g/dL (ref 32.0–36.0)
MCV: 93.2 fL (ref 80.0–100.0)
PLATELETS: 338 10*3/uL (ref 150–440)
RBC: 3.6 MIL/uL — ABNORMAL LOW (ref 3.80–5.20)
RDW: 15.2 % — AB (ref 11.5–14.5)
WBC: 9.2 10*3/uL (ref 3.6–11.0)

## 2017-10-21 LAB — COMPREHENSIVE METABOLIC PANEL
ALBUMIN: 3.7 g/dL (ref 3.5–5.0)
ALBUMIN: 3.4 g/dL — AB (ref 3.5–5.0)
ALK PHOS: 67 U/L (ref 38–126)
ALK PHOS: 60 U/L (ref 38–126)
ALT: 10 U/L (ref 0–44)
ALT: 9 U/L (ref 0–44)
ANION GAP: 10 (ref 5–15)
ANION GAP: 8 (ref 5–15)
AST: 20 U/L (ref 15–41)
AST: 28 U/L (ref 15–41)
BILIRUBIN TOTAL: 0.5 mg/dL (ref 0.3–1.2)
BILIRUBIN TOTAL: 0.7 mg/dL (ref 0.3–1.2)
BUN: 19 mg/dL (ref 8–23)
BUN: 17 mg/dL (ref 8–23)
CALCIUM: 8.4 mg/dL — AB (ref 8.9–10.3)
CO2: 25 mmol/L (ref 22–32)
CO2: 25 mmol/L (ref 22–32)
CREATININE: 0.85 mg/dL (ref 0.44–1.00)
Calcium: 8.8 mg/dL — ABNORMAL LOW (ref 8.9–10.3)
Chloride: 100 mmol/L (ref 98–111)
Chloride: 103 mmol/L (ref 98–111)
Creatinine, Ser: 0.91 mg/dL (ref 0.44–1.00)
GFR calc Af Amer: >60 mL/min (ref >60)
GFR calc Af Amer: >60 mL/min (ref >60)
GFR calc non Af Amer: 60 mL/min — ABNORMAL LOW (ref >60)
GFR calc non Af Amer: 55 mL/min — ABNORMAL LOW (ref >60)
GLUCOSE: 111 mg/dL — AB (ref 70–99)
GLUCOSE: 138 mg/dL — AB (ref 70–99)
Potassium: 3.7 mmol/L (ref 3.5–5.1)
Potassium: 3.9 mmol/L (ref 3.5–5.1)
SODIUM: 135 mmol/L (ref 135–145)
Sodium: 136 mmol/L (ref 135–145)
TOTAL PROTEIN: 7.5 g/dL (ref 6.5–8.1)
Total Protein: 6.5 g/dL (ref 6.5–8.1)

## 2017-10-21 LAB — CBC WITH DIFFERENTIAL/PLATELET
Basophils Absolute: 0.1 10*3/uL (ref 0–0.1)
Basophils Relative: 1 %
EOS PCT: 0 %
Eosinophils Absolute: 0 10*3/uL (ref 0–0.7)
HEMATOCRIT: 36.8 % (ref 35.0–47.0)
Hemoglobin: 12.4 g/dL (ref 12.0–16.0)
Lymphocytes Relative: 10 %
Lymphs Abs: 0.8 10*3/uL — ABNORMAL LOW (ref 1.0–3.6)
MCH: 31.2 pg (ref 26.0–34.0)
MCHC: 33.7 g/dL (ref 32.0–36.0)
MCV: 92.5 fL (ref 80.0–100.0)
MONO ABS: 0.9 10*3/uL (ref 0.2–0.9)
MONOS PCT: 10 %
NEUTROS ABS: 6.8 10*3/uL — AB (ref 1.4–6.5)
Neutrophils Relative %: 79 %
Platelets: 364 10*3/uL (ref 150–440)
RBC: 3.98 MIL/uL (ref 3.80–5.20)
RDW: 14.8 % — AB (ref 11.5–14.5)
WBC: 8.6 10*3/uL (ref 3.6–11.0)

## 2017-10-21 LAB — DIFFERENTIAL
Basophils Absolute: 0.1 10*3/uL (ref 0–0.1)
Basophils Relative: 1 %
EOS PCT: 0 %
Eosinophils Absolute: 0 10*3/uL (ref 0–0.7)
LYMPHS ABS: 0.8 10*3/uL — AB (ref 1.0–3.6)
LYMPHS PCT: 9 %
MONOS PCT: 14 %
Monocytes Absolute: 1.3 10*3/uL — ABNORMAL HIGH (ref 0.2–0.9)
Neutro Abs: 7.1 10*3/uL — ABNORMAL HIGH (ref 1.4–6.5)
Neutrophils Relative %: 76 %

## 2017-10-21 LAB — APTT: aPTT: 34 seconds (ref 24–36)

## 2017-10-21 LAB — TROPONIN I: Troponin I: <0.03 ng/mL (ref <0.03)

## 2017-10-21 MED ORDER — ACETAMINOPHEN 650 MG RE SUPP: 650.0000 mg | RECTAL | Status: DC | PRN

## 2017-10-21 MED ORDER — IOPAMIDOL (ISOVUE-300) INJECTION 61%
100.0000 mL | Freq: Once | INTRAVENOUS | Status: AC | PRN
  Administered 2017-10-21: 100 mL via INTRAVENOUS

## 2017-10-21 MED ORDER — ASPIRIN 325 MG PO TABS
325.0000 mg | ORAL_TABLET | Freq: Every day | ORAL | Status: DC
  Filled 2017-10-21: qty 1

## 2017-10-21 MED ORDER — FLUTICASONE PROPIONATE 50 MCG/ACT NA SUSP
1.0000 | Freq: Every day | NASAL | Status: DC
  Administered 2017-10-22: 1 via NASAL
  Filled 2017-10-21: qty 16

## 2017-10-21 MED ORDER — SODIUM CHLORIDE 0.9 % IV SOLN
INTRAVENOUS | Status: DC
  Administered 2017-10-22: via INTRAVENOUS

## 2017-10-21 MED ORDER — STROKE: EARLY STAGES OF RECOVERY BOOK
Freq: Once | Status: AC
  Administered 2017-10-21: 22:00:00

## 2017-10-21 MED ORDER — SODIUM CHLORIDE 0.9 % IV BOLUS
250.0000 mL | Freq: Once | INTRAVENOUS | Status: AC
  Administered 2017-10-21: 23:00:00 250 mL via INTRAVENOUS

## 2017-10-21 MED ORDER — SENNOSIDES-DOCUSATE SODIUM 8.6-50 MG PO TABS: 1.0000 | ORAL_TABLET | Freq: Every evening | ORAL | Status: DC | PRN

## 2017-10-21 MED ORDER — DOCUSATE SODIUM 100 MG PO CAPS: 100.0000 mg | ORAL_CAPSULE | Freq: Every day | ORAL | Status: DC | PRN

## 2017-10-21 MED ORDER — ACETAMINOPHEN 160 MG/5ML PO SOLN
650.0000 mg | ORAL | Status: DC | PRN
  Filled 2017-10-21: qty 20.3

## 2017-10-21 MED ORDER — PANTOPRAZOLE SODIUM 40 MG PO TBEC
40.0000 mg | DELAYED_RELEASE_TABLET | Freq: Every day | ORAL | Status: DC
  Administered 2017-10-22: 40 mg via ORAL
  Filled 2017-10-21: qty 1

## 2017-10-21 MED ORDER — ASPIRIN EC 325 MG PO TBEC
325.0000 mg | DELAYED_RELEASE_TABLET | Freq: Every day | ORAL | Status: DC
  Administered 2017-10-21 – 2017-10-22 (×2): 325 mg via ORAL
  Filled 2017-10-21 (×3): qty 1

## 2017-10-21 MED ORDER — ASPIRIN 300 MG RE SUPP
300.0000 mg | Freq: Every day | RECTAL | Status: DC
  Filled 2017-10-21: qty 1

## 2017-10-21 MED ORDER — ACETAMINOPHEN 325 MG PO TABS: 650.0000 mg | ORAL_TABLET | ORAL | Status: DC | PRN

## 2017-10-21 MED ORDER — APIXABAN 5 MG PO TABS
5.0000 mg | ORAL_TABLET | Freq: Two times a day (BID) | ORAL | Status: DC
  Administered 2017-10-21 – 2017-10-22 (×2): 5 mg via ORAL
  Filled 2017-10-21 (×2): qty 1

## 2017-10-21 MED ORDER — ASPIRIN 300 MG RE SUPP: 300.0000 mg | Freq: Every day | RECTAL | Status: DC

## 2017-10-21 NOTE — ED Notes (Signed)
Pt made aware of needed urine sample, unable to void , will continue to monitor

## 2017-10-21 NOTE — ED Provider Notes (Signed)
[facility] Emergency Department Provider Note   ____________________________________________   First MD Initiated Contact with Patient 10/21/17 1721     (approximate)  I have reviewed the triage vital signs and the nursing notes.   HISTORY  Chief Complaint Weakness   HPI Nicole Clayton is a 84 y.o. female history of previous MI, pulmonary embolism, UTIs  Patient reports that yesterday she is experiencing a headache, report is somewhat generalized with fairly severe and is gotten better this morning but when she got up this morning not quite sure what time it was this morning she noticed her right leg seems somewhat weak and she is also having trouble walking.  No fevers or chills.  No nausea vomiting.  Think somewhat fatigue.  Symptoms were noticed this morning, having difficulty walking like her balance was off a little bit as well according to her daughters   She had a headache yesterday, that is improved now.  Currently taking and compliant with her aspirin and Xarelto  Past Medical History:  Diagnosis Date  . Bladder infection, chronic 10/19/2011  . Cancer (HCC)    Ovarian   . 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 Claiborne Rigg and Delorise Shiner Choksi Overview:  Drs. Claiborne Rigg and [employer]   . Concussion   . GERD (gastroesophageal reflux disease)   . MI (mitral incompetence) 10/27/2014   Overview:  MODERATE   . Mitral valve prolapse   . OAB (overactive bladder)   . Ovarian cancer ([place])   . Pulmonary embolism ([place])   . Tachycardia     Patient Active Problem List   Diagnosis Date Noted  . Pain and swelling of right lower leg 10/11/2016  . Anemia 09/11/2015  . UTI (urinary tract infection) due to Enterococcus 07/19/2015  . 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 Neuropathy 02/10/2015  . Midline cystocele 12/15/2014  . Vaginal atrophy 12/15/2014  . Status post hysterectomy with oophorectomy 12/15/2014   . Celiac disease 11/07/2014  . MI (mitral incompetence) 10/27/2014  . Infection of urinary tract 08/12/2014  . CD (celiac disease) 07/12/2014  . Mixed hyperlipidemia 04/12/2014  . Paroxysmal supraventricular tachycardia ([place]) 04/12/2014  . Thrombocythemia ([place]) 09/26/2013  . Vitamin D deficiency 09/26/2013  . Frequent UTI 09/26/2013  . Chest pain 07/28/2013  . Edema 07/06/2013  . Other symptoms involving urinary system 07/07/2012  . Female genuine stress incontinence 02/10/2012  . Bladder infection, chronic 10/19/2011  . Urge incontinence 10/19/2011  . 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 ABDOMINAL HYSTERECTOMY    . CHOLECYSTECTOMY    . LAPAROSCOPIC BILATERAL SALPINGO OOPHERECTOMY  2011  . ovarian cancer surgery  2011    Prior to Admission medications   Medication Sig Start Date End Date Taking? Authorizing Provider  acetaminophen (TYLENOL) 650 MG CR tablet Take 650 mg by mouth every 8 (eight) hours as needed for pain.    [provider]  apixaban (ELIQUIS) 5 MG TABS tablet Take 1 tablet (5 mg total) by mouth 2 (two) times daily. 01/17/17   [name], MD  Ascorbic Acid (VITAMIN C) 100 MG tablet Take 100 mg by mouth daily.    [provider]  aspirin EC 81 MG tablet Take 81 mg by mouth.    [provider]  cephALEXin (KEFLEX) 250 MG capsule Take 250 mg by mouth.  05/17/17   [provider]  cetirizine (ZYRTEC) 10 MG tablet Take 10 mg by mouth daily.     [provider]  cholecalciferol (VITAMIN D) 1000 units tablet Take 1,000 Units by mouth daily.     [provider]  docusate sodium (COLACE) 100 MG capsule Take 100 mg by mouth daily as needed for mild constipation.     [provider]  fluticasone (FLONASE) 50 MCG/ACT nasal spray Place 1 spray into both nostrils daily.  04/26/14   [provider]  gabapentin (NEURONTIN)  600 MG tablet TAKE (1) TABLET BY MOUTH THREE TIMES A DAY Patient taking differently: Take 600 mg by mouth 2 (two) times daily as needed. TAKE (1) TABLET BY MOUTH THREE TIMES A DAY 07/25/16   Lequita Asal, MD  loperamide (IMODIUM) 2 MG capsule Take 1 capsule (2 mg total) by mouth as needed for diarrhea or loose stools. Take 2 capsules after 1st loose stool, then 1 capsule after subsequent loose stools up to 8 mg per 24 hours. 07/14/17   Noreene Filbert, MD  metoprolol succinate (TOPROL-XL) 50 MG 24 hr tablet  05/17/17   [provider]  naproxen sodium (ANAPROX) 220 MG tablet Take 220 mg by mouth 2 (two) times daily with a meal.    [provider]  nitrofurantoin, macrocrystal-monohydrate, (MACROBID) 100 MG capsule Take 1 capsule (100 mg total) by mouth 2 (two) times daily. Patient not taking: Reported on 07/29/2017 07/22/17   Defrancesco, Alanda Slim, MD  nystatin ointment (MYCOSTATIN) Apply 1 application topically 2 (two) times daily. 05/14/17   Defrancesco, Alanda Slim, MD  ondansetron (ZOFRAN) 4 MG tablet Take 4 mg by mouth every 8 (eight) hours as needed for nausea or vomiting.    [provider]  pantoprazole (PROTONIX) 40 MG tablet Take 40 mg by mouth daily.  10/18/11   [provider]  triamcinolone ointment (KENALOG) 0.1 % Apply 1 application topically 2 (two) times daily. Patient not taking: Reported on 06/26/2017 05/14/17   Defrancesco, Alanda Slim, MD    Allergies Benadryl [diphenhydramine]; Tamsulosin; Alendronate sodium; Duloxetine; Duloxetine hcl; Risedronate sodium; Lovastatin; and Sulfa antibiotics  Family History  Problem Relation Age of Onset  . Ovarian cancer Other   . Breast cancer Neg Hx   . Colon cancer Neg Hx   . Diabetes Neg Hx   . Heart disease Neg Hx   . Kidney disease Neg Hx   . Bladder Cancer Neg Hx     Social History Social History   Tobacco Use  . Smoking status: Never Smoker  . Smokeless tobacco: Never Used  Substance Use  Topics  . Alcohol use: No  . Drug use: No    Review of Systems Constitutional: No fever/chills Eyes: No visual changes. ENT: No sore throat. Cardiovascular: Denies chest pain. Respiratory: Denies shortness of breath. Gastrointestinal: No abdominal pain.  No nausea, no vomiting.  No diarrhea.  No constipation. Genitourinary: Negative for dysuria. Musculoskeletal: Negative for back pain. Skin: Negative for rash. Neurological: Negative for headaches, focal weakness or numbness.    ____________________________________________   PHYSICAL EXAM:  VITAL SIGNS: ED Triage Vitals  Enc Vitals Group     BP 10/21/17 1723 122/68     Pulse Rate 10/21/17 1722 76     Resp 10/21/17 1722 16     Temp 10/21/17 1724 98.6 F (37 C)     Temp Source 10/21/17 1724 Oral     SpO2 10/21/17 1722 98 %     Weight 10/21/17 1719 147 lb  3.2 oz (66.8 kg)     Height --      Head Circumference --      Peak Flow --      Pain Score 10/21/17 1719 0     Pain Loc --      Pain Edu? --      Excl. in [place]? --     Constitutional: Alert and oriented. Well appearing and in no acute distress. Eyes: Conjunctivae are normal. Head: Atraumatic. Nose: No congestion/rhinnorhea. Mouth/Throat: Mucous membranes are moist. Neck: No stridor.   Cardiovascular: Normal rate, regular rhythm. Grossly normal heart sounds.  Good peripheral circulation. Respiratory: Normal respiratory effort.  No retractions. Lungs CTAB. Gastrointestinal: Soft and nontender. No distention. Musculoskeletal:   NIH score equals 1, performed by me at bedside. The patient has no pronator drift. The patient has normal cranial nerve exam. Extraocular movements are normal. Visual fields are normal. Patient has 5 out of 5 strength in all extremities except the right lower extremity which demonstrates about 3 out of 5 strength. There is no numbness or gross, acute sensory abnormality in the extremities bilaterally. No speech disturbance. No  dysarthria. No aphasia. No ataxia. Normal finger nose finger bilat. Patient speaking in full and clear sentences.  VAN Negative.  Neurologic:  Normal speech and language. No gross focal neurologic deficits are appreciated.  Skin:  Skin is warm, dry and intact. No rash noted. Psychiatric: Mood and affect are normal. Speech and behavior are normal.  ____________________________________________   LABS (all labs ordered are listed, but only abnormal results are displayed)  Labs Reviewed  CBC - Abnormal; Notable for the following components:      Result Value   RBC 3.60 (*)    Hemoglobin 11.4 (*)    HCT 33.6 (*)    RDW 15.2 (*)    All other components within normal limits  DIFFERENTIAL - Abnormal; Notable for the following components:   Neutro Abs 7.1 (*)    Lymphs Abs 0.8 (*)    Monocytes Absolute 1.3 (*)    All other components within normal limits  COMPREHENSIVE METABOLIC PANEL - Abnormal; Notable for the following components:   Glucose, Bld 138 (*)    Calcium 8.4 (*)    Albumin 3.4 (*)    GFR calc non Af Amer 55 (*)    All other components within normal limits  PROTIME-INR - Abnormal; Notable for the following components:   Prothrombin Time 15.4 (*)    All other components within normal limits  TROPONIN I  APTT  URINALYSIS, ROUTINE W REFLEX MICROSCOPIC   ____________________________________________  EKG  Reviewed and interpreted by me at 1725 Heart rate 95 Cures 100 QTc 500 Normal sinus rhythm no evidence of acute ischemia. ____________________________________________  RADIOLOGY  CT head reviewed negative for acute  ____________________________________________   PROCEDURES  Procedure(s) performed: None  Procedures  Critical Care performed: No  ____________________________________________   INITIAL IMPRESSION / ASSESSMENT AND PLAN / ED COURSE  Pertinent labs & imaging results that were available during my care of the patient were reviewed by me  and considered in my medical decision making (see chart for details).  Patient returns for evaluation of fatigue, weakness some notable weakness involving the right lower extremity on well outside the TPA window, symptoms were noticed when she got up this morning though she is now 100% clear on the timeframe when she got out of bed which is thought to be early this morning.  No headache now, CT negative for  acute stroke or bleed.  Lab work reassuring, will check urinalysis pending at the time of admission decision  Her EKG reassuring no cardiac or pulmonary symptoms.  Discussed with hospitalist service, Dr. Bridgett Larsson.  Plan to admit the patient for further work-up of her weakness, fatigue difficulty with use of the right lower extremity and exclude stroke given the presentation.  She is already previously on antiplatelet and anticoagulant.      ____________________________________________   FINAL CLINICAL IMPRESSION(S) / ED DIAGNOSES  Final diagnoses:  Weakness  Transient right leg weakness      NEW MEDICATIONS STARTED DURING THIS VISIT:  New Prescriptions   No medications on file     Note:  This document was prepared using Dragon voice recognition software and may include unintentional dictation errors.     Delman Kitten, MD 10/21/17 2000

## 2017-10-21 NOTE — H&P (Signed)
[facility] [place] NAME: Nicole Clayton    MR#:  [id number]  DATE OF BIRTH:  Sep 05, 1928  DATE OF ADMISSION:  10/21/2017  PRIMARY CARE PHYSICIAN: Ezequiel Kayser, MD   REQUESTING/REFERRING PHYSICIAN: Dr. Jacqualine Code  CHIEF COMPLAINT:   Chief Complaint  Patient presents with  . Weakness   Weakness. HISTORY OF PRESENT ILLNESS:  Nicole Clayton  is a 84 y.o. female with a known history of chronic bladder infection, ovarian cancer, GERD, PE and mitral vale prolapse.  The patient was sent from home to ED due to above chief complaints.  She has had headache and generalized weakness since yesterday.  She also complains of right leg weakness and have trouble walking.  She denies any other symptoms.  CAT scan of the head is unremarkable.  Dr. Jacqualine Code request admission for TIA and stroke work-up.  PAST MEDICAL HISTORY:   Past Medical History:  Diagnosis Date  . Bladder infection, chronic 10/19/2011  . Cancer (HCC)    Ovarian   . 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 Claiborne Rigg and Delorise Shiner Choksi Overview:  Drs. Claiborne Rigg and [employer]   . Concussion   . GERD (gastroesophageal reflux disease)   . MI (mitral incompetence) 10/27/2014   Overview:  MODERATE   . Mitral valve prolapse   . OAB (overactive bladder)   . Ovarian cancer ([place])   . Pulmonary embolism ([place])   . Tachycardia     PAST SURGICAL HISTORY:   Past Surgical History:  Procedure Laterality Date  . ABDOMINAL HYSTERECTOMY    . CHOLECYSTECTOMY    . LAPAROSCOPIC BILATERAL SALPINGO OOPHERECTOMY  2011  . ovarian cancer surgery  2011    SOCIAL HISTORY:   Social History   Tobacco Use  . Smoking status: Never Smoker  . Smokeless tobacco: Never Used  Substance Use Topics  . Alcohol use: No    FAMILY HISTORY:   Family History  Problem Relation Age of Onset  . Ovarian cancer Other   . Breast cancer Neg Hx   . Colon cancer Neg Hx   .  Diabetes Neg Hx   . Heart disease Neg Hx   . Kidney disease Neg Hx   . Bladder Cancer Neg Hx     DRUG ALLERGIES:   Allergies  Allergen Reactions  . Benadryl [Diphenhydramine] Shortness Of Breath    Sob, rash, swelling  . Tamsulosin Swelling  . Alendronate Sodium     Other reaction(s): Other (See Comments) Dysphagia  . Duloxetine Diarrhea  . Duloxetine Hcl Diarrhea  . Risedronate Sodium Rash    Aching, dysphagia  . Lovastatin     Other reaction(s): Other (See Comments) GI upset  . Sulfa Antibiotics Rash    States she can take but leave as allergy    REVIEW OF SYSTEMS:   Review of Systems  Constitutional: Positive for malaise/fatigue. Negative for chills and fever.  HENT: Negative for sore throat.   Eyes: Negative for blurred vision and double vision.  Respiratory: Negative for cough, hemoptysis, shortness of breath, wheezing and stridor.   Cardiovascular: Negative for chest pain, palpitations, orthopnea and leg swelling.  Gastrointestinal: Negative for abdominal pain, blood in stool, diarrhea, melena, nausea and vomiting.  Genitourinary: Negative for dysuria, flank pain and hematuria.  Musculoskeletal: Negative for back pain and joint pain.  Skin: Negative for rash.  Neurological: Positive for weakness. Negative for dizziness, sensory change, focal weakness,  seizures, loss of consciousness and headaches.  Endo/Heme/Allergies: Negative for polydipsia.  Psychiatric/Behavioral: Negative for depression. The patient is not nervous/anxious.     MEDICATIONS AT HOME:   Prior to Admission medications   Medication Sig Start Date End Date Taking? Authorizing Provider  acetaminophen (TYLENOL) 650 MG CR tablet Take 650 mg by mouth 2 (two) times daily as needed for pain.    Yes [provider]  apixaban (ELIQUIS) 5 MG TABS tablet Take 1 tablet (5 mg total) by mouth 2 (two) times daily. 01/17/17  Yes [name], MD  calcium carbonate (TUMS - DOSED IN MG ELEMENTAL  CALCIUM) 500 MG chewable tablet Chew 2 tablets by mouth every 2 (two) hours as needed for indigestion or heartburn.   Yes [provider]  cephALEXin (KEFLEX) 250 MG capsule Take 250 mg by mouth daily.  05/17/17  Yes [provider]  cetirizine (ZYRTEC) 10 MG tablet Take 10 mg by mouth daily.    Yes [provider]  cholecalciferol (VITAMIN D) 1000 units tablet Take 1,000 Units by mouth daily.    Yes [provider]  docusate sodium (COLACE) 100 MG capsule Take 100 mg by mouth daily as needed for mild constipation.    Yes [provider]  fluticasone (FLONASE) 50 MCG/ACT nasal spray Place 2 sprays into both nostrils daily.    Yes [provider]  gabapentin (NEURONTIN) 600 MG tablet Take 600 mg by mouth every morning.   Yes [provider]  loperamide (IMODIUM) 2 MG capsule Take 1 capsule (2 mg total) by mouth as needed for diarrhea or loose stools. Take 2 capsules after 1st loose stool, then 1 capsule after subsequent loose stools up to 8 mg per 24 hours. Patient taking differently: See admin instructions. Take 2 capsules after 1st loose stool, then 1 capsule after subsequent loose stools up to 8 mg per 24 hours. 07/14/17  Yes Chrystal, Eulas Post, MD  metoprolol succinate (TOPROL-XL) 50 MG 24 hr tablet Take 50 mg by mouth daily.  05/17/17  Yes [provider]  Multiple Vitamin (MULTIVITAMIN WITH MINERALS) TABS tablet Take 1 tablet by mouth daily.   Yes [provider]  ondansetron (ZOFRAN) 4 MG tablet Take 4 mg by mouth every 8 (eight) hours as needed for nausea or vomiting.   Yes [provider]  pantoprazole (PROTONIX) 40 MG tablet Take 40 mg by mouth daily.  10/18/11  Yes [provider]  vitamin B-12 (CYANOCOBALAMIN) 500 MCG tablet Take 500 mcg by mouth daily.   Yes [provider]  vitamin C (ASCORBIC ACID) 500 MG tablet Take 500 mg by mouth daily.    Yes [provider]  nitrofurantoin,  macrocrystal-monohydrate, (MACROBID) 100 MG capsule Take 1 capsule (100 mg total) by mouth 2 (two) times daily. Patient not taking: Reported on 07/29/2017 07/22/17   Defrancesco, Alanda Slim, MD  nystatin ointment (MYCOSTATIN) Apply 1 application topically 2 (two) times daily. Patient not taking: Reported on 10/21/2017 05/14/17   Defrancesco, Alanda Slim, MD  triamcinolone ointment (KENALOG) 0.1 % Apply 1 application topically 2 (two) times daily. Patient not taking: Reported on 06/26/2017 05/14/17   Defrancesco, Alanda Slim, MD      VITAL SIGNS:  Blood pressure 122/82, pulse 68, temperature 98.2 F (36.8 C), temperature source Oral, resp. rate (!) 23, weight 66.8 kg, SpO2 94 %.  PHYSICAL EXAMINATION:  Physical Exam  GENERAL:  84 y.o.-year-old patient lying in the bed with no acute distress.  EYES: Pupils equal,  round, reactive to light and accommodation. No scleral icterus. Extraocular muscles intact.  HEENT: Head atraumatic, normocephalic. Oropharynx and nasopharynx clear.  NECK:  Supple, no jugular venous distention. No thyroid enlargement, no tenderness.  LUNGS: Normal breath sounds bilaterally, no wheezing, rales,rhonchi or crepitation. No use of accessory muscles of respiration.  CARDIOVASCULAR: S1, S2 normal. No murmurs, rubs, or gallops.  ABDOMEN: Soft, nontender, nondistended. Bowel sounds present. No organomegaly or mass.  EXTREMITIES: No pedal edema, cyanosis, or clubbing.  NEUROLOGIC: Cranial nerves II through XII are intact. Muscle strength 4/5 in all extremities. Sensation intact. Gait not checked.  PSYCHIATRIC: The patient is alert and oriented x 3.  SKIN: No obvious rash, lesion, or ulcer.   LABORATORY PANEL:   CBC Recent Labs  Lab 10/21/17 1723  WBC 9.2  HGB 11.4*  HCT 33.6*  PLT 338   ------------------------------------------------------------------------------------------------------------------  Chemistries  Recent Labs  Lab 10/21/17 1723  NA 136  K 3.9  CL 103    CO2 25  GLUCOSE 138*  BUN 17  CREATININE 0.91  CALCIUM 8.4*  AST 28  ALT 9  ALKPHOS 60  BILITOT 0.7   ------------------------------------------------------------------------------------------------------------------  Cardiac Enzymes Recent Labs  Lab 10/21/17 1723  TROPONINI <0.03   ------------------------------------------------------------------------------------------------------------------  RADIOLOGY:  Ct Head Wo Contrast  Result Date: 10/21/2017 CLINICAL DATA:  Weakness. History of ovarian cancer. EXAM: CT HEAD WITHOUT CONTRAST TECHNIQUE: Contiguous axial images were obtained from the base of the skull through the vertex without intravenous contrast. COMPARISON:  07/09/2016 FINDINGS: Brain: There is no evidence of acute infarct, intracranial hemorrhage, mass, midline shift, or extra-axial fluid collection. Lateral and third ventriculomegaly is unchanged and is favored to reflect central predominant cerebral atrophy with hydrocephalus considered less likely. Patchy to confluent cerebral white matter hypodensities are unchanged and nonspecific but compatible with moderately extensive chronic small vessel ischemic disease. Vascular: Calcified atherosclerosis at the skull base. No hyperdense vessel. Skull: No fracture or focal osseous lesion. Sinuses/Orbits: Visualized paranasal sinuses and mastoid air cells are clear. Bilateral cataract extraction. Other: None. IMPRESSION: 1. No evidence of acute intracranial abnormality. 2. Moderate chronic small vessel ischemic disease. Electronically Signed   By: Logan Bores M.D.   On: 10/21/2017 18:36   Ct Abdomen Pelvis W Contrast  Result Date: 10/21/2017 CLINICAL DATA:  History of fallopian tube adenocarcinoma with palliative radiation to the right inguinal region 1 month ago. EXAM: CT ABDOMEN AND PELVIS WITH CONTRAST TECHNIQUE: Multidetector CT imaging of the abdomen and pelvis was performed using the standard protocol following bolus  administration of intravenous contrast. CONTRAST:  179mL ISOVUE-300 IOPAMIDOL (ISOVUE-300) INJECTION 61% COMPARISON:  06/20/2017 FINDINGS: Lower chest: Large hiatal hernia again noted. Heart is enlarged without evidence of pericardial effusion. Hepatobiliary: No focal abnormality within the liver parenchyma. Gallbladder surgically absent. Stable mild prominence of the extrahepatic bile duct, likely related to cholecystectomy. Pancreas: No focal mass lesion. No dilatation of the main duct. No intraparenchymal cyst. No peripancreatic edema. Spleen: No splenomegaly. No focal mass lesion. Adrenals/Urinary Tract: No adrenal nodule or mass. Kidneys unremarkable. No evidence for hydroureter. The urinary bladder appears normal for the degree of distention. Stomach/Bowel: Large hiatal hernia. Duodenum is normally positioned as is the ligament of Treitz. No small bowel wall thickening. No small bowel dilatation. The terminal ileum is normal. The appendix is not visualized, but there is no edema or inflammation in the region of the cecum. Diverticular changes are noted in the left colon without evidence of diverticulitis. Vascular/Lymphatic: There is abdominal aortic atherosclerosis without aneurysm.  There is no gastrohepatic or hepatoduodenal ligament lymphadenopathy. No intraperitoneal or retroperitoneal lymphadenopathy. The 16 mm short axis right external iliac lymph node measured on the previous study is now 20 mm in short axis. Interval development of central low-attenuation in this lymph node suggesting necrosis. The necrotic right inguinal lymph nodes are again identified. The more cranial of the 2 measures 2.2 x 4.0 cm today at the same level it was measured at 3.0 x 4.5 cm previously. The more medial and caudal of the 2 nodal lesions measures 2.9 x 3.6 cm today at the same level it was measured at 3.7 x 3.7 cm previously. A left groin lymph node has progressed in the interval measuring 13 mm short axis today compared  to 8 mm short axis previously. Reproductive: Uterus surgically absent. There is no adnexal mass. Pessary device noted in the vagina. Other: No intraperitoneal free fluid. Musculoskeletal: No worrisome lytic or sclerotic osseous abnormality. IMPRESSION: 1. The right external iliac lymph node has increased slightly in size in the interval in shows evidence of interval central necrosis. 2. The 2 inguinal index lymph nodes measure slightly smaller on today's exam. 3. A left groin/inguinal lymph node is progressive and now minimally enlarged at 13 mm short axis compared 8 mm previously. Imaging features raise concern for a new site of metastatic involvement in close attention recommended. 4.  Aortic Atherosclerois (ICD10-170.0) 5. Large hiatal hernia. Electronically Signed   By: Misty Stanley M.D.   On: 10/21/2017 14:59      IMPRESSION AND PLAN:   Generalized weakness, possible TIA, rule out CVA. The patient will be placed for observation. Start aspirin and continue Eliquis. Neuro check, MRI of the brain, PT and OT evaluation.  Echocardiograph.  History of PE.  Continue Eliquis.  All the records are reviewed and case discussed with ED provider. Management plans discussed with the patient, her daughter-in-law and they are in agreement.  CODE STATUS: Full code.  TOTAL TIME TAKING CARE OF THIS PATIENT: 38 minutes.    Demetrios Loll M.D on 10/21/2017 at 9:50 PM  Between 7am to 6pm - Pager - [phone]  After 6pm go to [email or web address] - [employer] [facility]  Office  [phone]  CC: Primary care physician; Ezequiel Kayser, MD   Note: This dictation was prepared with Dragon dictation along with smaller phrase technology. Any transcriptional errors that result from this process are unin

## 2017-10-21 NOTE — ED Notes (Signed)
ED Provider at bedside. 

## 2017-10-21 NOTE — ED Triage Notes (Signed)
Pt to ED via EMS from [facility] with c/o weakness, states started this am after she had a routine CT head with contrast. Pt neg stroke screen per EMS, VSS. Pt A&OX4, speech clear

## 2017-10-21 NOTE — Progress Notes (Signed)
Advanced Care Plan.  Purpose of Encounter: CODE STATUS. Parties in Attendance: The patient, her daughter-in-law and me. Patient's Decisional Capacity: Yes. Medical Story: Nicole Clayton  is a 84 y.o. female with a known history of chronic bladder infection, ovarian cancer, GERD, PE and mitral vale prolapse.  The patient is being admitted for weakness, rule out TIA or CVA.  I discussed with patient about her current condition, prognosis and CODE STATUS.  The patient stated that she want to be resuscitated and intubated.  Plan:  Code Status: Full code. Time spent discussing advance care planning: 16-7 minutes.

## 2017-10-22 ENCOUNTER — Observation Stay
Admit: 2017-10-22 | Discharge: 2017-10-22 | Disposition: A | Discharge status: Home / Self Care | Payer: Medicare HMO | Attending: Internal Medicine | Admitting: Internal Medicine

## 2017-10-22 ENCOUNTER — Observation Stay: Payer: Medicare HMO

## 2017-10-22 DIAGNOSIS — R51 Headache: Secondary | ICD-10-CM | POA: Diagnosis not present

## 2017-10-22 DIAGNOSIS — R531 Weakness: Secondary | ICD-10-CM | POA: Diagnosis not present

## 2017-10-22 DIAGNOSIS — G459 Transient cerebral ischemic attack, unspecified: Secondary | ICD-10-CM | POA: Diagnosis not present

## 2017-10-22 DIAGNOSIS — I2699 Other pulmonary embolism without acute cor pulmonale: Secondary | ICD-10-CM | POA: Diagnosis not present

## 2017-10-22 LAB — LIPID PANEL
CHOL/HDL RATIO: 3.1 ratio
Cholesterol: 179 mg/dL (ref 0–200)
HDL: 58 mg/dL (ref >40)
LDL CALC: 109 mg/dL — AB (ref 0–99)
Triglycerides: 62 mg/dL (ref <150)
VLDL: 12 mg/dL (ref 0–40)

## 2017-10-22 LAB — ECHOCARDIOGRAM COMPLETE
Height: 60 in
Weight: 2233 oz

## 2017-10-22 LAB — CA 125: Cancer Antigen (CA) 125: 40.8 U/mL — ABNORMAL HIGH (ref 0.0–38.1)

## 2017-10-22 LAB — MRSA PCR SCREENING: MRSA BY PCR: NEGATIVE

## 2017-10-22 NOTE — Care Management Obs Status (Signed)
[facility] NOTIFICATION   Patient Details  Name: Nicole Clayton MRN: [id number] Date of Birth: 1928-12-10   Medicare Observation Status Notification Given:  Yes. Explained to [name], RN 10/22/2017, 7:51 AM

## 2017-10-22 NOTE — Progress Notes (Signed)
Spoke to Dr Benjie Karvonen who is discharging  patient told her that neurology never saw, she said to discontinue all stroke orders and that they do not need neurology to see

## 2017-10-22 NOTE — Progress Notes (Signed)
*  PRELIMINARY RESULTS* Echocardiogram 2D Echocardiogram has been performed.  Nicole Clayton 10/22/2017, 8:29 AM

## 2017-10-22 NOTE — Evaluation (Signed)
Occupational Therapy Evaluation Patient Details Name: Nicole Clayton MRN: [id number] DOB: [birth year] Today's Date: 10/22/2017    History of Present Illness Patient is an 84 year old female admitted for stroke workup following report of R side weakness.  PMH includes PE, tachycardia, ovarian CA, fallopian tube CA and MI.   Clinical Impression   Pt seen for OT evaluation this date. Prior to hospital admission, pt was ambulating with a rollator and living at an ALF, getting assist for bathing and compression garments as needed.  Currently pt demonstrates impairments in activity tolerance, strength, and safety with mobility/ADL tasks, requiring CGA to min assist for LB ADL and mobility with RW. Pt endorses limiting fluid intake 2/2 need to use the bathroom frequently and also doesn't enjoy drinking water. To support optimal hydration to minimize health risks and falls risk, pt/family educated in strategies to improve fluid intake, incontinence products and strategies to trial to improve self mgt, and introduced the concept of a toileting schedule to support pt in getting to the bathroom on a routine regular basis t/o the day. Pt/family verbalized understanding, would benefit from additional training/education to support recall and implementation into daily routines. Pt would benefit from skilled OT to address noted impairments and functional limitations (see below for any additional details) in order to maximize safety and independence while minimizing falls risk and caregiver burden.  Upon hospital discharge, recommend pt discharge back to ALF with [facility] services.    Follow Up Recommendations  Home health OT(at ALF)    Equipment Recommendations  None recommended by OT    Recommendations for Other Services       Precautions / Restrictions Precautions Precautions: Fall Restrictions Weight Bearing Restrictions: No      Mobility Bed Mobility Overal bed mobility: Needs Assistance Bed Mobility:  Supine to Sit;Sit to Supine     Supine to sit: Min assist Sit to supine: Min guard     Transfers Overall transfer level: Needs assistance Equipment used: Rolling walker (2 wheeled) Transfers: Sit to/from Stand Sit to Stand: Supervision             Balance Overall balance assessment: Modified Independent                                         ADL either performed or assessed with clinical judgement   ADL Overall ADL's : Needs assistance/impaired         Upper Body Bathing: Sitting;Supervision/ safety   Lower Body Bathing: Sit to/from stand;Minimal assistance   Upper Body Dressing : Sitting;Supervision/safety   Lower Body Dressing: Sit to/from stand;Minimal assistance   Toilet Transfer: RW;Min guard                   Vision Baseline Vision/History: Wears glasses Wears Glasses: At all times Patient Visual Report: No change from baseline       Perception     Praxis      Pertinent Vitals/Pain Pain Assessment: No/denies pain     Hand Dominance Right   Extremity/Trunk Assessment Upper Extremity Assessment Upper Extremity Assessment: Generalized weakness(Pt limited in shoulder ROM due to back and neck pain; strength and coordination symmetrical)   Lower Extremity Assessment Lower Extremity Assessment: Overall WFL for tasks assessed(Grossly 4-/5 bilaterally.  Reports baseline neuropathy, RLE with chronic lymphedema)   Cervical / Trunk Assessment Cervical / Trunk Assessment: Kyphotic   Communication Communication Communication:  No difficulties   Cognition Arousal/Alertness: Awake/alert Behavior During Therapy: WFL for tasks assessed/performed Overall Cognitive Status: Within Functional Limits for tasks assessed                                 General Comments: Follows commands consistently.   General Comments       Exercises Other Exercises Other Exercises: Pt/family educated in bladder mgt strategies  including a toileting schedule and products to support incontinence and minimize falls risk Other Exercises: Pt/family educated in strategies to support proper hydration and implementing strategies to support incorporating into daily routines to maximize safety and well-being, as pt endorses limiting water intake 2/2 bladder incontinence and need to urinate frequently   Shoulder Instructions      Home Living Family/patient expects to be discharged to:: Assisted living                             Home Equipment: Walker - 2 wheels          Prior Functioning/Environment Level of Independence: Needs assistance  Gait / Transfers Assistance Needed: Ambulates household distances or uses WC ADL's / Homemaking Assistance Needed: Assistance with bathing and compression garments for lymphedema, ALF manages meals, housekeeping, and medications            OT Problem List: Decreased strength;Decreased knowledge of use of DME or AE;Increased edema;Decreased range of motion;Decreased activity tolerance      OT Treatment/Interventions: Self-care/ADL training;Therapeutic exercise;Therapeutic activities;Energy conservation;DME and/or AE instruction;Patient/family education    OT Goals(Current goals can be found in the care plan section) Acute Rehab OT Goals Patient Stated Goal: go back to ALF and return to PLOF OT Goal Formulation: With patient/family Time For Goal Achievement: 11/05/17 Potential to Achieve Goals: Good  OT Frequency: Min 1X/week   Barriers to D/C:            Co-evaluation              AM-PAC PT "6 Clicks" Daily Activity     Outcome Measure Help from another person eating meals?: None Help from another person taking care of personal grooming?: None Help from another person toileting, which includes using toliet, bedpan, or urinal?: A Little Help from another person bathing (including washing, rinsing, drying)?: A Little Help from another person to put on  and taking off regular upper body clothing?: None Help from another person to put on and taking off regular lower body clothing?: A Little 6 Click Score: 21   End of Session    Activity Tolerance: Patient tolerated treatment well Patient left: in bed;with call bell/phone within reach;with bed alarm set;with family/visitor present  OT Visit Diagnosis: Other abnormalities of gait and mobility (R26.89);Muscle weakness (generalized) (M62.81)                Time: 1113-1140 OT Time Calculation (min): 27 min Charges:  OT General Charges $OT Visit: 1 Visit OT Evaluation $OT Eval Low Complexity: 1 Low OT Treatments $Self Care/Home Management : 8-22 mins  Jeni Salles, MPH, MS, OTR/L ascom [phone] 10/22/17, 3:13 PM

## 2017-10-22 NOTE — NC FL2 (Addendum)
[place] LEVEL OF CARE SCREENING TOOL     IDENTIFICATION  Patient Name: Nicole Clayton Birthdate: Jun 13, 1928 Sex: female Admission Date (Current Location): 10/21/2017  [place] and [place] Number:  Engineering geologist and Address:  [facility], [address] [place], [place] [address]      Provider Number: [id number]  Attending Physician Name and Address:  Bettey Costa, MD  Relative Name and Phone Number:  Marin Roberts- daughter [phone]    Current Level of Care: Hospital Recommended Level of Care: [place] Prior Approval Number:    Date Approved/Denied:   PASRR Number:    Discharge Plan: Domiciliary (Rest home)    Current Diagnoses: Patient Active Problem List   Diagnosis Date Noted  . Weakness 10/21/2017  . Pain and swelling of right lower leg 10/11/2016  . Anemia 09/11/2015  . UTI (urinary tract infection) due to Enterococcus 07/19/2015  . 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 Neuropathy 02/10/2015  . Midline cystocele 12/15/2014  . Vaginal atrophy 12/15/2014  . Status post hysterectomy with oophorectomy 12/15/2014  . Celiac disease 11/07/2014  . MI (mitral incompetence) 10/27/2014  . Infection of urinary tract 08/12/2014  . CD (celiac disease) 07/12/2014  . Mixed hyperlipidemia 04/12/2014  . Paroxysmal supraventricular tachycardia ([place]) 04/12/2014  . Thrombocythemia ([place]) 09/26/2013  . Vitamin D deficiency 09/26/2013  . Frequent UTI 09/26/2013  . Chest pain 07/28/2013  . Edema 07/06/2013  . Other symptoms involving urinary system 07/07/2012  . Female genuine stress incontinence 02/10/2012  . Bladder infection, chronic 10/19/2011  . Urge incontinence 10/19/2011  . Malignant neoplasm of right fallopian tube ([place]) 10/05/2009    Orientation RESPIRATION BLADDER Height & Weight     Self, Time, Situation, Place  Normal Incontinent Weight: 139 lb 9 oz (63.3 kg) Height:  5' (152.4 cm)  BEHAVIORAL  SYMPTOMS/MOOD NEUROLOGICAL BOWEL NUTRITION STATUS  (none) (none) Continent Diet(Heart Healthy )  AMBULATORY STATUS COMMUNICATION OF NEEDS Skin   Limited Assist Verbally Normal                       Personal Care Assistance Level of Assistance  Bathing, Feeding, Dressing Bathing Assistance: Limited assistance Feeding assistance: Independent Dressing Assistance: Independent     Functional Limitations Info  Sight, Hearing, Speech Sight Info: Adequate Hearing Info: Adequate Speech Info: Adequate    SPECIAL CARE FACTORS FREQUENCY                       Contractures Contractures Info: Not present    Additional Factors Info  Code Status, Allergies Code Status Info: Full Code  Allergies Info: Benadryl Diphenhydramine, Tamsulosin, Alendronate Sodium, Duloxetine, Duloxetine Hcl, Risedronate Sodium, Lovastatin, Sulfa Antibiotics           Medication List        STOP taking these medications       cephALEXin 250 MG capsule Commonly known as:  KEFLEX             TAKE these medications       acetaminophen 650 MG CR tablet Commonly known as:  TYLENOL Take 650 mg by mouth 2 (two) times daily as needed for pain.   apixaban 5 MG Tabs tablet Commonly known as:  ELIQUIS Take 1 tablet (5 mg total) by mouth 2 (two) times daily.   calcium carbonate 500 MG chewable tablet Commonly known as:  TUMS - dosed in mg elemental calcium Chew 2 tablets by mouth  every 2 (two) hours as needed for indigestion or heartburn.   cetirizine 10 MG tablet Commonly known as:  ZYRTEC Take 10 mg by mouth daily.   cholecalciferol 1000 units tablet Commonly known as:  VITAMIN D Take 1,000 Units by mouth daily.   docusate sodium 100 MG capsule Commonly known as:  COLACE Take 100 mg by mouth daily as needed for mild constipation.   fluticasone 50 MCG/ACT nasal spray Commonly known as:  FLONASE Place 2 sprays into both nostrils daily.   gabapentin 600 MG tablet Commonly  known as:  NEURONTIN Take 600 mg by mouth every morning.   loperamide 2 MG capsule Commonly known as:  IMODIUM Take 1 capsule (2 mg total) by mouth as needed for diarrhea or loose stools. Take 2 capsules after 1st loose stool, then 1 capsule after subsequent loose stools up to 8 mg per 24 hours. What changed:    how much to take  how to take this  when to take this   metoprolol succinate 50 MG 24 hr tablet Commonly known as:  TOPROL-XL Take 50 mg by mouth daily.   multivitamin with minerals Tabs tablet Take 1 tablet by mouth daily.   nitrofurantoin (macrocrystal-monohydrate) 100 MG capsule Commonly known as:  MACROBID Take 1 capsule (100 mg total) by mouth 2 (two) times daily.   nystatin ointment Commonly known as:  MYCOSTATIN Apply 1 application topically 2 (two) times daily.   ondansetron 4 MG tablet Commonly known as:  ZOFRAN Take 4 mg by mouth every 8 (eight) hours as needed for nausea or vomiting.   pantoprazole 40 MG tablet Commonly known as:  PROTONIX Take 40 mg by mouth daily.   triamcinolone ointment 0.1 % Commonly known as:  KENALOG Apply 1 application topically 2 (two) times daily.   vitamin B-12 500 MCG tablet Commonly known as:  CYANOCOBALAMIN Take 500 mcg by mouth daily.   vitamin C 500 MG tablet Commonly known as:  ASCORBIC ACID Take 500 mg by mouth daily.      Discharge Medications: Please see discharge summary for a list of discharge medications.  Relevant Imaging Results:  Relevant Lab Results:   Additional Information    Kelliann Pendergraph  Louretta Shorten, LCSWA

## 2017-10-22 NOTE — Progress Notes (Signed)
SLP Cancellation Note  Patient Details Name: Nicole Clayton MRN: [id number] DOB: 03/31/1928   Cancelled treatment:       Reason Eval/Treat Not Completed: SLP screened, no needs identified, will sign off(chartr reviewed; consulted NSG then met w/ pt/Son in room). Pt denied any difficulty swallowing and is currently on a regular diet; tolerates swallowing pills w/ water per NSG. Pt conversed at conversational level w/out deficits noted; pt and Son denied any speech-language deficits stating she was "stronger today".  No further skilled ST services indicated as pt appears at her baseline. Pt agreed. NSG to reconsult if any change in status while admitted.      , MS, CCC-SLP , 10/22/2017, 10:01 AM   

## 2017-10-22 NOTE — Care Management Note (Signed)
Case Management Note  Patient Details  Name: Nicole Clayton MRN: [id number] Date of Birth: 05-Jun-1928  Subjective/Objective:  Admitted to [facility] under observation status with the diagnosis of weakness. Lives at [facility] since December 2018. Daughter is [name] [phone]). Sees primary care physician every 6 months. Uses rolling walker at the facility, Fair appetite. No falls.                  Action/Plan: Will continue to follow for discharge plans, if needed.   Expected Discharge Date:                  Expected Discharge Plan:     In-House Referral:     Discharge planning Services     Post Acute Care Choice:    Choice offered to:     DME Arranged:    DME Agency:     HH Arranged:    HH Agency:     Status of Service:     If discussed at [employer], dates discussed:    Additional Comments:  Shelbie Ammons, RN MSN CCM Care Management [phone] 10/22/2017, 8:03 AM

## 2017-10-22 NOTE — Clinical Social Work Note (Signed)
Clinical Social Work Assessment  Patient Details  Name: Nicole Clayton MRN: [id number] Date of Birth: 11/19/1928  Date of referral:  10/22/17               Reason for consult:  Facility Placement                Permission sought to share information with:  Case Manager, Customer service manager, Family Supports Permission granted to share information::  Yes, Verbal Permission Granted  Name::        Agency::     Relationship::     Contact Information:     Housing/Transportation Living arrangements for the past 2 months:  [place] of Information:  Patient Patient Interpreter Needed:  None Criminal Activity/Legal Involvement Pertinent to Current Situation/Hospitalization:  No - Comment as needed Significant Relationships:  Adult Children Lives with:  Facility Resident Do you feel safe going back to the place where you live?  Yes Need for family participation in patient care:  Yes (Comment)  Care giving concerns:  Patient admitted from Assisted Living    Social Worker assessment / plan:  CSW consulted for facility placement. CSW met with patient and son Nicole Clayton at bedside. CSW introduced self and explained role. Patient states that she has lived at [facility] assisted living since November 2018. Patient states that she wants to return to [facility] and has no other concerns. CSW explained to patient and son that MD would like to discharge her today. Patient and son are in agreement with this. CSW notified [facility] of discharge today.   Employment status:  Retired Nurse, adult PT Recommendations:  Not assessed at this time Information / Referral to community resources:     Patient/Family's Response to care:  Patient and son thanked CSW for assistance   Patient/Family's Understanding of and Emotional Response to Diagnosis, Current Treatment, and Prognosis:  Patient and son are in agreement with plan   Emotional  Assessment Appearance:  Appears stated age Attitude/Demeanor/Rapport:    Affect (typically observed):  Accepting, Pleasant, Hopeful Orientation:  Oriented to Self, Oriented to Place, Oriented to  Time, Oriented to Situation Alcohol / Substance use:  Not Applicable Psych involvement (Current and /or in the community):  No (Comment)  Discharge Needs  Concerns to be addressed:  Discharge Planning Concerns Readmission within the last 30 days:  No Current discharge risk:  None Barriers to Discharge:  No Barriers Identified   Annamaria Boots, [place] 10/22/2017, 10:06 AM

## 2017-10-22 NOTE — Discharge Summary (Addendum)
[facility] NAME: Nicole Clayton    MR#:  [id number]  DATE OF BIRTH:  September 04, 1928  DATE OF ADMISSION:  10/21/2017 ADMITTING PHYSICIAN: Demetrios Loll, MD  DATE OF DISCHARGE: October 22, 2017 PRIMARY CARE PHYSICIAN: Ezequiel Kayser, MD    ADMISSION DIAGNOSIS:  Weakness [R53.1] Transient right leg weakness [R29.898]  DISCHARGE DIAGNOSIS:  Active Problems:   Weakness   SECONDARY DIAGNOSIS:   Past Medical History:  Diagnosis Date  . Bladder infection, chronic 10/19/2011  . Cancer (HCC)    Ovarian   . 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 Claiborne Rigg and Delorise Shiner Choksi Overview:  Drs. Claiborne Rigg and [employer]   . Concussion   . GERD (gastroesophageal reflux disease)   . MI (mitral incompetence) 10/27/2014   Overview:  MODERATE   . Mitral valve prolapse   . OAB (overactive bladder)   . Ovarian cancer ([place])   . Pulmonary embolism ([place])   . Tachycardia     HOSPITAL COURSE:  84 year old female with a history of PE on anticoagulation who presented with generalized weakness.  1  Generalized weakness right greater than left: Patient was evaluated for CVA.  Her stroke work-up has been negative.  2.  History of PE: Patient will continue on anticoagulation with Eliquis  3 chronic right leg lymphedema: Continue upping leg  4.  Essential hypertension: Continue metoprolol   DISCHARGE CONDITIONS AND DIET:   Stable for discharge on regular diet  CONSULTS OBTAINED:    DRUG ALLERGIES:   Allergies  Allergen Reactions  . Benadryl [Diphenhydramine] Shortness Of Breath    Sob, rash, swelling  . Tamsulosin Swelling  . Alendronate Sodium     Other reaction(s): Other (See Comments) Dysphagia  . Duloxetine Diarrhea  . Duloxetine Hcl Diarrhea  . Risedronate Sodium Rash    Aching, dysphagia  . Lovastatin     Other reaction(s): Other (See Comments) GI upset  . Sulfa Antibiotics Rash     States she can take but leave as allergy    DISCHARGE MEDICATIONS:   Allergies as of 10/22/2017      Reactions   Benadryl [diphenhydramine] Shortness Of Breath   Sob, rash, swelling   Tamsulosin Swelling   Alendronate Sodium    Other reaction(s): Other (See Comments) Dysphagia   Duloxetine Diarrhea   Duloxetine Hcl Diarrhea   Risedronate Sodium Rash   Aching, dysphagia   Lovastatin    Other reaction(s): Other (See Comments) GI upset   Sulfa Antibiotics Rash   States she can take but leave as allergy      Medication List    STOP taking these medications   cephALEXin 250 MG capsule Commonly known as:  KEFLEX     TAKE these medications   acetaminophen 650 MG CR tablet Commonly known as:  TYLENOL Take 650 mg by mouth 2 (two) times daily as needed for pain.   apixaban 5 MG Tabs tablet Commonly known as:  ELIQUIS Take 1 tablet (5 mg total) by mouth 2 (two) times daily.   calcium carbonate 500 MG chewable tablet Commonly known as:  TUMS - dosed in mg elemental calcium Chew 2 tablets by mouth every 2 (two) hours as needed for indigestion or heartburn.   cetirizine 10 MG tablet Commonly known as:  ZYRTEC Take 10 mg by mouth daily.   cholecalciferol 1000 units tablet Commonly known as:  VITAMIN D Take 1,000 Units by  mouth daily.   docusate sodium 100 MG capsule Commonly known as:  COLACE Take 100 mg by mouth daily as needed for mild constipation.   fluticasone 50 MCG/ACT nasal spray Commonly known as:  FLONASE Place 2 sprays into both nostrils daily.   gabapentin 600 MG tablet Commonly known as:  NEURONTIN Take 600 mg by mouth every morning.   loperamide 2 MG capsule Commonly known as:  IMODIUM Take 1 capsule (2 mg total) by mouth as needed for diarrhea or loose stools. Take 2 capsules after 1st loose stool, then 1 capsule after subsequent loose stools up to 8 mg per 24 hours. What changed:    how much to take  how to take this  when to take this    metoprolol succinate 50 MG 24 hr tablet Commonly known as:  TOPROL-XL Take 50 mg by mouth daily.   multivitamin with minerals Tabs tablet Take 1 tablet by mouth daily.   nitrofurantoin (macrocrystal-monohydrate) 100 MG capsule Commonly known as:  MACROBID Take 1 capsule (100 mg total) by mouth 2 (two) times daily.   nystatin ointment Commonly known as:  MYCOSTATIN Apply 1 application topically 2 (two) times daily.   ondansetron 4 MG tablet Commonly known as:  ZOFRAN Take 4 mg by mouth every 8 (eight) hours as needed for nausea or vomiting.   pantoprazole 40 MG tablet Commonly known as:  PROTONIX Take 40 mg by mouth daily.   triamcinolone ointment 0.1 % Commonly known as:  KENALOG Apply 1 application topically 2 (two) times daily.   vitamin B-12 500 MCG tablet Commonly known as:  CYANOCOBALAMIN Take 500 mcg by mouth daily.   vitamin C 500 MG tablet Commonly known as:  ASCORBIC ACID Take 500 mg by mouth daily.            Durable Medical Equipment  (From admission, onward)         Start     Ordered   10/22/17 0923  For home use only DME Walker rolling  Once    Question:  Patient needs a walker to treat with the following condition  Answer:  Weakness   10/22/17 0923            Today   CHIEF COMPLAINT:  No acute events overnight   VITAL SIGNS:  Blood pressure 117/65, pulse 83, temperature 98.5 F (36.9 C), temperature source Oral, resp. rate 20, height 5' (1.524 m), weight 63.3 kg, SpO2 94 %.   REVIEW OF SYSTEMS:  Review of Systems  Constitutional: Negative.  Negative for chills, fever and malaise/fatigue.  HENT: Negative.  Negative for ear discharge, ear pain, hearing loss, nosebleeds and sore throat.   Eyes: Negative.  Negative for blurred vision and pain.  Respiratory: Negative.  Negative for cough, hemoptysis, shortness of breath and wheezing.   Cardiovascular: Positive for leg swelling (Lymphedema right leg). Negative for chest pain and  palpitations.  Gastrointestinal: Negative.  Negative for abdominal pain, blood in stool, diarrhea, nausea and vomiting.  Genitourinary: Negative.  Negative for dysuria.  Musculoskeletal: Negative.  Negative for back pain.  Skin: Negative.   Neurological: Negative for dizziness, tremors, speech change, focal weakness, seizures and headaches.  Endo/Heme/Allergies: Negative.  Does not bruise/bleed easily.  Psychiatric/Behavioral: Negative.  Negative for depression, hallucinations and suicidal ideas.     PHYSICAL EXAMINATION:  GENERAL:  84 y.o.-year-old patient lying in the bed with no acute distress.  NECK:  Supple, no jugular venous distention. No thyroid enlargement, no tenderness.  LUNGS:  Normal breath sounds bilaterally, no wheezing, rales,rhonchi  No use of accessory muscles of respiration.  CARDIOVASCULAR: S1, S2 normal. 2/6 murmurs, no rubs, or gallops.  ABDOMEN: Soft, non-tender, non-distended. Bowel sounds present. No organomegaly or mass.  EXTREMITIES: She with chronic right lower extremity lymphedema.  PSYCHIATRIC: The patient is alert and oriented x 3.  SKIN: No obvious rash, lesion, or ulcer.   DATA REVIEW:   CBC Recent Labs  Lab 10/21/17 1723  WBC 9.2  HGB 11.4*  HCT 33.6*  PLT 338    Chemistries  Recent Labs  Lab 10/21/17 1723  NA 136  K 3.9  CL 103  CO2 25  GLUCOSE 138*  BUN 17  CREATININE 0.91  CALCIUM 8.4*  AST 28  ALT 9  ALKPHOS 60  BILITOT 0.7    Cardiac Enzymes Recent Labs  Lab 10/21/17 1723  TROPONINI <0.03    Microbiology Results  @[email or web address] @  RADIOLOGY:  Ct Head Wo Contrast  Result Date: 10/21/2017 CLINICAL DATA:  Weakness. History of ovarian cancer. EXAM: CT HEAD WITHOUT CONTRAST TECHNIQUE: Contiguous axial images were obtained from the base of the skull through the vertex without intravenous contrast. COMPARISON:  07/09/2016 FINDINGS: Brain: There is no evidence of acute infarct, intracranial hemorrhage, mass, midline shift, or  extra-axial fluid collection. Lateral and third ventriculomegaly is unchanged and is favored to reflect central predominant cerebral atrophy with hydrocephalus considered less likely. Patchy to confluent cerebral white matter hypodensities are unchanged and nonspecific but compatible with moderately extensive chronic small vessel ischemic disease. Vascular: Calcified atherosclerosis at the skull base. No hyperdense vessel. Skull: No fracture or focal osseous lesion. Sinuses/Orbits: Visualized paranasal sinuses and mastoid air cells are clear. Bilateral cataract extraction. Other: None. IMPRESSION: 1. No evidence of acute intracranial abnormality. 2. Moderate chronic small vessel ischemic disease. Electronically Signed   By: Logan Bores M.D.   On: 10/21/2017 18:36   Mr Brain Wo Contrast  Result Date: 10/22/2017 CLINICAL DATA:  Initial evaluation for acute generalized weakness, headache. EXAM: MRI HEAD WITHOUT CONTRAST MRA HEAD WITHOUT CONTRAST TECHNIQUE: Multiplanar, multiecho pulse sequences of the brain and surrounding structures were obtained without intravenous contrast. Angiographic images of the head were obtained using MRA technique without contrast. COMPARISON:  Prior CT from 10/21/2017. FINDINGS: MRI HEAD FINDINGS Brain: Generalized age-related cerebral atrophy. Patchy and confluent T2/FLAIR hyperintensity within the periventricular and deep white matter both cerebral hemispheres, most consistent with chronic small vessel ischemic disease, moderate nature. No abnormal foci of restricted diffusion to suggest acute or subacute ischemia. Gray-white matter differentiation maintained. No areas of remote cortical infarction. No acute or chronic intracranial hemorrhage. No mass lesion, midline shift or mass effect. Diffuse ventricular prominence most likely related global parenchymal volume loss. No hydrocephalus. Pituitary gland normal. No extra-axial fluid collection. Vascular: Major intracranial vascular  flow voids maintained at the skull base. Skull and upper cervical spine: Craniocervical junction normal. No focal marrow replacing lesion. Scalp soft tissues unremarkable. Sinuses/Orbits: Patient status post ocular lens replacement bilaterally. Paranasal sinuses are clear. No significant mastoid effusion. Inner ear structures grossly normal. Other: None. MRA HEAD FINDINGS ANTERIOR CIRCULATION: Internal carotid arteries widely patent to the termini without stenosis. A1 segments, anterior communicating artery common anterior cerebral arteries widely patent. M1 segments widely patent without stenosis. Distal MCA branches well perfused and symmetric. POSTERIOR CIRCULATION: Vertebral arteries widely patent to the vertebrobasilar junction without stenosis. Basilar artery widely patent to its distal aspect. Superior cerebellar and posterior cerebral arteries widely patent bilaterally. No intracranial aneurysm. IMPRESSION:  MRI HEAD IMPRESSION: 1. No acute intracranial abnormality. 2. Generalized age-related cerebral atrophy with moderate chronic small vessel ischemic disease. MRA HEAD IMPRESSION: Negative intracranial MRA. No large vessel occlusion. No hemodynamically significant or correctable stenosis. Electronically Signed   By: Jeannine Boga M.D.   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 EXAM: CT ABDOMEN AND PELVIS WITH CONTRAST TECHNIQUE: Multidetector CT imaging of the abdomen and pelvis was performed using the standard protocol following bolus administration of intravenous contrast. CONTRAST:  1105mL ISOVUE-300 IOPAMIDOL (ISOVUE-300) INJECTION 61% COMPARISON:  06/20/2017 FINDINGS: Lower chest: Large hiatal hernia again noted. Heart is enlarged without evidence of pericardial effusion. Hepatobiliary: No focal abnormality within the liver parenchyma.  Gallbladder surgically absent. Stable mild prominence of the extrahepatic bile duct, likely related to cholecystectomy. Pancreas: No focal mass lesion. No dilatation of the main duct. No intraparenchymal cyst. No peripancreatic edema. Spleen: No splenomegaly. No focal mass lesion. Adrenals/Urinary Tract: No adrenal nodule or mass. Kidneys unremarkable. No evidence for hydroureter. The urinary bladder appears normal for the degree of distention. Stomach/Bowel: Large hiatal hernia. Duodenum is normally positioned as is the ligament of Treitz. No small bowel wall thickening. No small bowel dilatation. The terminal ileum is normal. The appendix is not visualized, but there is no edema or inflammation in the region of the cecum. Diverticular changes are noted in the left colon without evidence of diverticulitis. Vascular/Lymphatic: There is abdominal aortic atherosclerosis without aneurysm. There is no gastrohepatic or hepatoduodenal ligament lymphadenopathy. No intraperitoneal or retroperitoneal lymphadenopathy. The 16 mm short axis right external iliac lymph node measured on the previous study is now 20 mm in short axis. Interval development of central low-attenuation in this lymph node suggesting necrosis. The necrotic right inguinal lymph nodes are again identified. The more cranial of the 2 measures 2.2 x 4.0 cm today at the same level it was measured at 3.0 x 4.5 cm previously. The more medial and caudal of the 2 nodal lesions measures 2.9 x 3.6 cm today at the same level it was measured at 3.7 x 3.7 cm previously. A left groin lymph node has progressed in the interval measuring 13 mm short axis today compared to 8 mm short axis previously. Reproductive: Uterus surgically absent. There is no adnexal mass. Pessary device noted in the vagina. Other: No intraperitoneal free fluid. Musculoskeletal: No worrisome lytic or sclerotic osseous abnormality. IMPRESSION: 1. The right external iliac lymph node has increased  slightly in size in the interval in shows evidence of interval central necrosis. 2. The 2 inguinal index lymph nodes measure slightly smaller on today's exam. 3. A left groin/inguinal lymph node is progressive and now minimally enlarged at 13 mm short axis compared 8 mm previously. Imaging features raise concern for a new site of metastatic involvement in close attention recommended. 4.  Aortic Atherosclerois (ICD10-170.0) 5. Large hiatal hernia. Electronically Signed   By: Misty Stanley M.D.   On: 10/21/2017 14:59   Mr Jodene Nam Head/brain BJ Cm  Result Date: 10/22/2017 CLINICAL DATA:  Initial evaluation for acute generalized weakness, headache. EXAM: MRI HEAD WITHOUT CONTRAST MRA HEAD WITHOUT CONTRAST TECHNIQUE: Multiplanar, multiecho pulse sequences of the brain and surrounding structures were obtained without intravenous contrast. Angiographic images of the head were obtained using MRA technique without contrast. COMPARISON:  Prior CT from 10/21/2017. FINDINGS: MRI HEAD FINDINGS Brain: Generalized age-related cerebral  atrophy. Patchy and confluent T2/FLAIR hyperintensity within the periventricular and deep white matter both cerebral hemispheres, most consistent with chronic small vessel ischemic disease, moderate nature. No abnormal foci of restricted diffusion to suggest acute or subacute ischemia. Gray-white matter differentiation maintained. No areas of remote cortical infarction. No acute or chronic intracranial hemorrhage. No mass lesion, midline shift or mass effect. Diffuse ventricular prominence most likely related global parenchymal volume loss. No hydrocephalus. Pituitary gland normal. No extra-axial fluid collection. Vascular: Major intracranial vascular flow voids maintained at the skull base. Skull and upper cervical spine: Craniocervical junction normal. No focal marrow replacing lesion. Scalp soft tissues unremarkable. Sinuses/Orbits: Patient status post ocular lens replacement bilaterally.  Paranasal sinuses are clear. No significant mastoid effusion. Inner ear structures grossly normal. Other: None. MRA HEAD FINDINGS ANTERIOR CIRCULATION: Internal carotid arteries widely patent to the termini without stenosis. A1 segments, anterior communicating artery common anterior cerebral arteries widely patent. M1 segments widely patent without stenosis. Distal MCA branches well perfused and symmetric. POSTERIOR CIRCULATION: Vertebral arteries widely patent to the vertebrobasilar junction without stenosis. Basilar artery widely patent to its distal aspect. Superior cerebellar and posterior cerebral arteries widely patent bilaterally. No intracranial aneurysm. IMPRESSION: MRI HEAD IMPRESSION: 1. No acute intracranial abnormality. 2. Generalized age-related cerebral atrophy with moderate chronic small vessel ischemic disease. MRA HEAD IMPRESSION: Negative intracranial MRA. No large vessel occlusion. No hemodynamically significant or correctable stenosis. Electronically Signed   By: Jeannine Boga M.D.   On: 10/22/2017 06:59      Allergies as of 10/22/2017      Reactions   Benadryl [diphenhydramine] Shortness Of Breath   Sob, rash, swelling   Tamsulosin Swelling   Alendronate Sodium    Other reaction(s): Other (See Comments) Dysphagia   Duloxetine Diarrhea   Duloxetine Hcl Diarrhea   Risedronate Sodium Rash   Aching, dysphagia   Lovastatin    Other reaction(s): Other (See Comments) GI upset   Sulfa Antibiotics Rash   States she can take but leave as allergy      Medication List    STOP taking these medications   cephALEXin 250 MG capsule Commonly known as:  KEFLEX     TAKE these medications   acetaminophen 650 MG CR tablet Commonly known as:  TYLENOL Take 650 mg by mouth 2 (two) times daily as needed for pain.   apixaban 5 MG Tabs tablet Commonly known as:  ELIQUIS Take 1 tablet (5 mg total) by mouth 2 (two) times daily.   calcium carbonate 500 MG chewable  tablet Commonly known as:  TUMS - dosed in mg elemental calcium Chew 2 tablets by mouth every 2 (two) hours as needed for indigestion or heartburn.   cetirizine 10 MG tablet Commonly known as:  ZYRTEC Take 10 mg by mouth daily.   cholecalciferol 1000 units tablet Commonly known as:  VITAMIN D Take 1,000 Units by mouth daily.   docusate sodium 100 MG capsule Commonly known as:  COLACE Take 100 mg by mouth daily as needed for mild constipation.   fluticasone 50 MCG/ACT nasal spray Commonly known as:  FLONASE Place 2 sprays into both nostrils daily.   gabapentin 600 MG tablet Commonly known as:  NEURONTIN Take 600 mg by mouth every morning.   loperamide 2 MG capsule Commonly known as:  IMODIUM Take 1 capsule (2 mg total) by mouth as needed for diarrhea or loose stools. Take 2 capsules after 1st loose stool, then 1 capsule after subsequent loose stools up to 8 mg  per 24 hours. What changed:    how much to take  how to take this  when to take this   metoprolol succinate 50 MG 24 hr tablet Commonly known as:  TOPROL-XL Take 50 mg by mouth daily.   multivitamin with minerals Tabs tablet Take 1 tablet by mouth daily.   nitrofurantoin (macrocrystal-monohydrate) 100 MG capsule Commonly known as:  MACROBID Take 1 capsule (100 mg total) by mouth 2 (two) times daily.   nystatin ointment Commonly known as:  MYCOSTATIN Apply 1 application topically 2 (two) times daily.   ondansetron 4 MG tablet Commonly known as:  ZOFRAN Take 4 mg by mouth every 8 (eight) hours as needed for nausea or vomiting.   pantoprazole 40 MG tablet Commonly known as:  PROTONIX Take 40 mg by mouth daily.   triamcinolone ointment 0.1 % Commonly known as:  KENALOG Apply 1 application topically 2 (two) times daily.   vitamin B-12 500 MCG tablet Commonly known as:  CYANOCOBALAMIN Take 500 mcg by mouth daily.   vitamin C 500 MG tablet Commonly known as:  ASCORBIC ACID Take 500 mg by mouth  daily.            Durable Medical Equipment  (From admission, onward)         Start     Ordered   10/22/17 0923  For home use only DME Walker rolling  Once    Question:  Patient needs a walker to treat with the following condition  Answer:  Weakness   10/22/17 0923            Management plans discussed with the patient and she is in agreement. Stable for discharge home  Patient should follow up with pcp  CODE STATUS:     Code Status Orders  (From admission, onward)         Start     Ordered   10/21/17 2123  Full code  Continuous     10/21/17 2122        Code Status History    Date Active Date Inactive Code Status Order ID Comments User Context   07/19/2015 1744 07/21/2015 1645 Full Code [id number]  Rubie Maid, MD Inpatient    Advance Directive Documentation     Most Recent Value  Type of Advance Directive  Healthcare Power of [place], Living will  Pre-existing out of facility DNR order (yellow form or pink MOST form)  -  "MOST" Form in Place?  -      TOTAL TIME TAKING CARE OF THIS PATIENT: 38 minutes.    Note: This dictation was prepared with Dragon dictation along with smaller phrase technology. Any transcriptional errors that result from this process are unintentional.  Rosemary Pentecost M.D on 10/22/2017 at 9:23 AM  Between 7am to 6pm - Pager - [phone] After 6pm go to [email or web address] - password [email or web address] [facility]  Office  [phone]  CC: Primary care physician; Ezequiel Kayser, MD

## 2017-10-22 NOTE — Care Management (Addendum)
  Physical therapy evaluation completed. Recommending home with home health [facility] refers [facility] or [facility]. Spoke with family member. States that [facility] has their own services in the facility. Home Health orders sent in packet Shelbie Ammons RN MSN [place] [phone]

## 2017-10-22 NOTE — Evaluation (Signed)
Physical Therapy Evaluation Patient Details Name: Nicole Clayton MRN: [id number] DOB: 06-Nov-1928 Today's Date: 10/22/2017   History of Present Illness  Patient is an 84 year old female admitted for stroke workup following report of R side weakness.  PMH includes PE, tachycardia, ovarian CA, fallopian tube CA and MI.  Clinical Impression  Patient is an 84 year old female who lives in an ALF.  She walks limited household ambulatory distances with RW at baseline and does use a WC.  Pt able to perform bed mobility with min A for movement of LE's as she has swelling and knee pain in the R LE.  She was able to sit at EOB without assistance.  Pt presented with no asymmetry of strength and reported baseline neuropathy.  Pt able to rise from bedside without physical assistance and required min A for management of obstacles during 30 ft of ambulation with RW.  Patient did appear unsteady on feet and required 3-4 rest breaks during ambulation.  PT assisted pt in donning compression stockings and educated concerning HEP management and pt was receptive.  Pt will benefit from skilled PT with focus on strength, tolerance to activity and Hep to prevent deconditioning following discharge.    Follow Up Recommendations Home health PT;Supervision for mobility/OOB    Equipment Recommendations  None recommended by PT    Recommendations for Other Services       Precautions / Restrictions Precautions Precautions: Fall Restrictions Weight Bearing Restrictions: No      Mobility  Bed Mobility Overal bed mobility: Needs Assistance Bed Mobility: Supine to Sit;Sit to Supine     Supine to sit: Min assist Sit to supine: Min guard   General bed mobility comments: Able to get to the EOB with hand held assist and able to scoot along EOB with VC's for body mechanics.  Transfers Overall transfer level: Needs assistance Equipment used: Rolling walker (2 wheeled) Transfers: Sit to/from Stand Sit to Stand:  Supervision         General transfer comment: Able to rise without assistance, very slow to rise but with no asymmetry.  Ambulation/Gait Ambulation/Gait assistance: Min assist Gait Distance (Feet): 30 Feet Assistive device: Rolling walker (2 wheeled)     Gait velocity interpretation: <1.8 ft/sec, indicate of risk for recurrent falls General Gait Details: Low to fair foot clearance, safe use of RW, min A for management of obstacles.  Stairs            Wheelchair Mobility    Modified Rankin (Stroke Patients Only)       Balance Overall balance assessment: Modified Independent                                           Pertinent Vitals/Pain Pain Assessment: No/denies pain    Home Living Family/patient expects to be discharged to:: Assisted living               Home Equipment: Walker - 2 wheels      Prior Function Level of Independence: Needs assistance   Gait / Transfers Assistance Needed: Ambulates household distances or uses WC  ADL's / Homemaking Assistance Needed: Assistance with bathing dressing        Hand Dominance   Dominant Hand: Right    Extremity/Trunk Assessment   Upper Extremity Assessment Upper Extremity Assessment: Generalized weakness(Pt limited in shoulder ROM due to back and neck  pain.)    Lower Extremity Assessment Lower Extremity Assessment: Overall WFL for tasks assessed(Grossly 4-/5 bilaterally.  Reports baseline neuropathy.)    Cervical / Trunk Assessment Cervical / Trunk Assessment: Kyphotic  Communication   Communication: No difficulties  Cognition Arousal/Alertness: Awake/alert Behavior During Therapy: WFL for tasks assessed/performed Overall Cognitive Status: Within Functional Limits for tasks assessed                                 General Comments: Follows commands consistently.      General Comments      Exercises Other Exercises Other Exercises: Issued an HEP and  discussed importance of continuation of regular exercise and scheduling HEP throughout the day. x4 min Other Exercises: Guided pt in scooting along EOB and Vc's for body mechanics for optimal movement; educated pt regarding benefit of practicing this exercise for better bed mobility.  x5 min   Assessment/Plan    PT Assessment Patient needs continued PT services  PT Problem List Decreased strength;Decreased activity tolerance;Decreased balance;Decreased knowledge of use of DME       PT Treatment Interventions DME instruction;Therapeutic activities;Gait training;Therapeutic exercise;Balance training;Functional mobility training;Neuromuscular re-education;Patient/family education    PT Goals (Current goals can be found in the Care Plan section)  Acute Rehab PT Goals PT Goal Formulation: Patient unable to participate in goal setting    Frequency Min 2X/week   Barriers to discharge        Co-evaluation               AM-PAC PT "6 Clicks" Daily Activity  Outcome Measure Difficulty turning over in bed (including adjusting bedclothes, sheets and blankets)?: A Little Difficulty moving from lying on back to sitting on the side of the bed? : A Little Difficulty sitting down on and standing up from a chair with arms (e.g., wheelchair, bedside commode, etc,.)?: A Little Help needed moving to and from a bed to chair (including a wheelchair)?: A Little Help needed walking in hospital room?: A Little Help needed climbing 3-5 steps with a railing? : A Little 6 Click Score: 18    End of Session Equipment Utilized During Treatment: Gait belt Activity Tolerance: Patient tolerated treatment well;Patient limited by fatigue Patient left: in bed;with bed alarm set;with call bell/phone within reach;with family/visitor present Nurse Communication: Mobility status PT Visit Diagnosis: Unsteadiness on feet (R26.81);Muscle weakness (generalized) (M62.81)    Time: 1749-4496 PT Time Calculation (min)  (ACUTE ONLY): 37 min   Charges:   PT Evaluation $PT Eval Low Complexity: 1 Low PT Treatments $Therapeutic Activity: 8-22 mins        Roxanne Gates, PT, DPT   Roxanne Gates 10/22/2017, 11:24 AM

## 2017-10-22 NOTE — Progress Notes (Signed)
Received MD order to discharge patient with home health, reviewed home meds discharge  instructions and follow up appointment with patient and daughter and both verbalized understanding

## 2017-10-23 LAB — HEMOGLOBIN A1C
Hgb A1c MFr Bld: 6.1 % — ABNORMAL HIGH (ref 4.8–5.6)
Mean Plasma Glucose: 128 mg/dL

## 2017-10-28 ENCOUNTER — Other Ambulatory Visit: Payer: Self-pay

## 2017-10-28 ENCOUNTER — Inpatient Hospital Stay: Payer: Medicare HMO

## 2017-10-28 ENCOUNTER — Inpatient Hospital Stay (HOSPITAL_BASED_OUTPATIENT_CLINIC_OR_DEPARTMENT_OTHER): Payer: Medicare HMO | Admitting: Internal Medicine

## 2017-10-28 VITALS — BP 114/75 | HR 64 | Temp 97.6°F | Resp 18 | Ht 60.0 in | Wt 136.7 lb

## 2017-10-28 DIAGNOSIS — Z923 Personal history of irradiation: Secondary | ICD-10-CM | POA: Diagnosis not present

## 2017-10-28 DIAGNOSIS — G459 Transient cerebral ischemic attack, unspecified: Secondary | ICD-10-CM

## 2017-10-28 DIAGNOSIS — C774 Secondary and unspecified malignant neoplasm of inguinal and lower limb lymph nodes: Secondary | ICD-10-CM | POA: Diagnosis not present

## 2017-10-28 DIAGNOSIS — I890 Lymphedema, not elsewhere classified: Secondary | ICD-10-CM | POA: Diagnosis not present

## 2017-10-28 DIAGNOSIS — Z95828 Presence of other vascular implants and grafts: Secondary | ICD-10-CM

## 2017-10-28 DIAGNOSIS — Z7901 Long term (current) use of anticoagulants: Secondary | ICD-10-CM

## 2017-10-28 DIAGNOSIS — C5701 Malignant neoplasm of right fallopian tube: Secondary | ICD-10-CM | POA: Insufficient documentation

## 2017-10-28 DIAGNOSIS — I2699 Other pulmonary embolism without acute cor pulmonale: Secondary | ICD-10-CM

## 2017-10-28 DIAGNOSIS — Z8041 Family history of malignant neoplasm of ovary: Secondary | ICD-10-CM | POA: Diagnosis not present

## 2017-10-28 DIAGNOSIS — Z90710 Acquired absence of both cervix and uterus: Secondary | ICD-10-CM | POA: Diagnosis not present

## 2017-10-28 MED ORDER — SODIUM CHLORIDE 0.9% FLUSH
10.0000 mL | Freq: Once | INTRAVENOUS | Status: AC
  Administered 2017-10-28: 10 mL via INTRAVENOUS
  Filled 2017-10-28: qty 10

## 2017-10-28 MED ORDER — HEPARIN SOD (PORK) LOCK FLUSH 100 UNIT/ML IV SOLN
500.0000 [IU] | Freq: Once | INTRAVENOUS | Status: AC
  Administered 2017-10-28: 500 [IU]

## 2017-10-28 MED ORDER — HEPARIN SOD (PORK) LOCK FLUSH 100 UNIT/ML IV SOLN
INTRAVENOUS | Status: AC
  Filled 2017-10-28: qty 5

## 2017-10-28 NOTE — Progress Notes (Signed)
[facility] OFFICE PROGRESS NOTE  Patient Care Team: Ezequiel Kayser, [name]  Cancer Staging No matching staging information was found for the patient.   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  Adequate TRS, no macroscopic residual. IP/IV chemotherapy with DDP and paclitaxel on GOG protocol, chemo and Bev consolidation completed in 03/2011 2. 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 November 14, 2011- Patient was started on carboplatin and gemcitabine 4. Finished 6 cycles of chemotherapy in April of 2014 with carboplatin and gemcitabine. Tolerance was fairly good except for neutropenia and thrombocytopenia. 5.recurrent disease by CT scan February of 2015  # .radiation therapy to pelvis  May of 2015  7.upper extremity  . and deep vein thrombosis associated with port.(June of 2015) patient started on   [place] had port removed and had   thrombectomy September 06, 2013. # CT DEC 2016- progressing disease in the right inguinal area # FEB-TAXOL-AVASTIN;  # FEB 2017- AVASTIN q 3W; July 2017- CT- Improved  Right Inguinal LN; STOP AVASTIN sec to potential concerns of AEs  # AUG 2017 3rd- START ZEJULA- declines sec to intol  # OCT 7th CT- STABLE RIGHT INGUINAL LN- RE-START AVASTIN q 3W [STOPPED oct 25th 2018]; Avastin discontinued October 2018 [stroke/PE]  # MAY 2019- Right inguinal adenopathy progression status post radiation [June 2019]   #TIA x2 [name] 2019 last]; PE [[facility]]; right lower extremity lymphedema-radiation/malignancy  ------------------------------------------------ MOLECULAR TESTING: somatic BRCA negative; F-ONE: No targets*   Dx: SEP 2013  fallopian tube/platinum sensitive high-grade serous-cancer  Current/most recent therapy-surveillance.  Goals: palliative; stage: Metastatic/Recurrent - IV          Malignant neoplasm of right fallopian tube [facility])      INTERVAL HISTORY:  Nicole Clayton 84 y.o.  female pleasant patient above history of metastatic fallopian tube high-grade serous most recently status post radiation to her right inguinal region is here for follow-up/review the results of the CT scan.  Patient was recently evaluated in the hospital for TIA when she had weakness of her right side of the body; MRI brain MRA neck negative for any acute stroke.  Patient has been working with lymphedema specialist/physical therapy for her right lower extremity swelling.  Overall improving.  Continues to have mild to moderate fatigue.  Denies any worsening constipation.  Denies any worsening abdominal distention. With  Review of Systems  Constitutional: Positive for malaise/fatigue. Negative for chills, diaphoresis, fever and weight loss.  HENT: Negative for nosebleeds and sore throat.   Eyes: Negative for double vision.  Respiratory: Negative for cough, hemoptysis, sputum production, shortness of breath and wheezing.   Cardiovascular: Positive for leg swelling. Negative for chest pain, palpitations and orthopnea.  Gastrointestinal: Negative for abdominal pain, blood in stool, constipation, diarrhea, heartburn, melena, nausea and vomiting.  Genitourinary: Negative for dysuria, frequency and urgency.  Musculoskeletal: Negative for back pain and joint pain.  Skin: Negative.  Negative for itching and rash.  Neurological: Negative for dizziness, tingling, focal weakness, weakness and headaches.  Endo/Heme/Allergies: Does not bruise/bleed easily.  Psychiatric/Behavioral: Negative for depression. The patient is not nervous/anxious and does not have insomnia.       PAST MEDICAL HISTORY :  Past Medical History:  Diagnosis Date  . Bladder infection, chronic 10/19/2011  . Cancer (HCC)    Ovarian   . Carcinoma of fallopian tube ([place]) 10/05/2009  Overview:  Overview:  Overview:  Drs. Claiborne Rigg  and Delorise Shiner Choksi Overview:  Drs. Claiborne Rigg and [employer]   . Concussion   . GERD (gastroesophageal reflux disease)   . MI (mitral incompetence) 10/27/2014   Overview:  MODERATE   . Mitral valve prolapse   . OAB (overactive bladder)   . Ovarian cancer ([place])   . Pulmonary embolism ([place])   . Tachycardia     PAST SURGICAL HISTORY :   Past Surgical History:  Procedure Laterality Date  . ABDOMINAL HYSTERECTOMY    . CHOLECYSTECTOMY    . LAPAROSCOPIC BILATERAL SALPINGO OOPHERECTOMY  2011  . ovarian cancer surgery  2011    FAMILY HISTORY :   Family History  Problem Relation Age of Onset  . Ovarian cancer Other   . Breast cancer Neg Hx   . Colon cancer Neg Hx   . Diabetes Neg Hx   . Heart disease Neg Hx   . Kidney disease Neg Hx   . Bladder Cancer Neg Hx     SOCIAL HISTORY:   Social History   Tobacco Use  . Smoking status: Never Smoker  . Smokeless tobacco: Never Used  Substance Use Topics  . Alcohol use: No  . Drug use: No    ALLERGIES:  is allergic to benadryl [diphenhydramine]; tamsulosin; alendronate sodium; duloxetine; duloxetine hcl; risedronate sodium; lovastatin; and sulfa antibiotics.  MEDICATIONS:  Current Outpatient Medications  Medication Sig Dispense Refill  . acetaminophen (TYLENOL) 650 MG CR tablet Take 650 mg by mouth 2 (two) times daily as needed for pain.     Marland Kitchen apixaban (ELIQUIS) 5 MG TABS tablet Take 1 tablet (5 mg total) by mouth 2 (two) times daily. 60 tablet 5  . calcium carbonate (TUMS - DOSED IN MG ELEMENTAL CALCIUM) 500 MG chewable tablet Chew 2 tablets by mouth every 2 (two) hours as needed for indigestion or heartburn.    . cetirizine (ZYRTEC) 10 MG tablet Take 10 mg by mouth daily.     . cholecalciferol (VITAMIN D) 1000 units tablet Take 1,000 Units by mouth daily.     Marland Kitchen docusate sodium (COLACE) 100 MG capsule Take 100 mg by mouth daily as needed for mild constipation.     . fluticasone (FLONASE) 50 MCG/ACT nasal spray Place 2 sprays  into both nostrils daily.     Marland Kitchen gabapentin (NEURONTIN) 600 MG tablet Take 600 mg by mouth every morning.    . metoprolol succinate (TOPROL-XL) 50 MG 24 hr tablet Take 50 mg by mouth daily.     . Multiple Vitamin (MULTIVITAMIN WITH MINERALS) TABS tablet Take 1 tablet by mouth daily.    . pantoprazole (PROTONIX) 40 MG tablet Take 40 mg by mouth daily.     . vitamin B-12 (CYANOCOBALAMIN) 500 MCG tablet Take 500 mcg by mouth daily.    . vitamin C (ASCORBIC ACID) 500 MG tablet Take 500 mg by mouth daily.     Marland Kitchen loperamide (IMODIUM) 2 MG capsule Take 1 capsule (2 mg total) by mouth as needed for diarrhea or loose stools. Take 2 capsules after 1st loose stool, then 1 capsule after subsequent loose stools up to 8 mg per 24 hours. (Patient not taking: Reported on 10/28/2017) 30 capsule 0  . nitrofurantoin, macrocrystal-monohydrate, (MACROBID) 100 MG capsule Take 1 capsule (100 mg total) by mouth 2 (two) times daily. (Patient not taking: Reported on 07/29/2017) 14 capsule 0  . ondansetron (ZOFRAN) 4 MG tablet Take 4 mg by mouth every 8 (  eight) hours as needed for nausea or vomiting.     No current facility-administered medications for this visit.    Facility-Administered Medications Ordered in Other Visits  Medication Dose Route Frequency Provider Last Rate Last Dose  . diphenhydrAMINE (BENADRYL) injection 50 mg  50 mg Intravenous Once Choksi, Janak, MD      . sodium chloride 0.9 % injection 10 mL  10 mL Intravenous PRN Forest Gleason, MD   10 mL at 01/12/15 1116  . sodium chloride 0.9 % injection 10 mL  10 mL Intravenous PRN Forest Gleason, MD   10 mL at 02/16/15 1025    PHYSICAL EXAMINATION: ECOG PERFORMANCE STATUS: 1 - Symptomatic but completely ambulatory  BP 114/75 (Patient Position: Sitting)   Pulse 64   Temp 97.6 F (36.4 C)   Resp 18   Ht 5' (1.524 m)   Wt 136 lb 11 oz (62 kg)   BMI 26.69 kg/m   Filed Weights   10/28/17 1418  Weight: 136 lb 11 oz (62 kg)    Physical Exam   Constitutional: She is oriented to person, place, and time.  Frail-appearing Caucasian female patient.  She is in a wheelchair.  Accompanied by daughter.  HENT:  Head: Normocephalic and atraumatic.  Mouth/Throat: Oropharynx is clear and moist. No oropharyngeal exudate.  Eyes: Pupils are equal, round, and reactive to light.  Neck: Normal range of motion. Neck supple.  Cardiovascular: Normal rate and regular rhythm.  Pulmonary/Chest: Breath sounds normal. No respiratory distress. She has no wheezes.  Abdominal: Soft. Bowel sounds are normal. She exhibits no distension and no mass. There is no tenderness. There is no rebound and no guarding.  Musculoskeletal: Normal range of motion. She exhibits no edema or tenderness.  Bilateral lower extremity swelling right more than left.  Neurological: She is alert and oriented to person, place, and time.  Skin: Skin is warm.  Psychiatric: Affect normal.       LABORATORY DATA:  I have reviewed the data as listed    Component Value Date/Time   NA 136 10/21/2017 1723   NA 138 06/02/2014 0910   K 3.9 10/21/2017 1723   K 3.7 06/02/2014 0910   CL 103 10/21/2017 1723   CL 107 06/02/2014 0910   CO2 25 10/21/2017 1723   CO2 26 06/02/2014 0910   GLUCOSE 138 (H) 10/21/2017 1723   GLUCOSE 106 (H) 06/02/2014 0910   BUN 17 10/21/2017 1723   BUN 17 06/02/2014 0910   CREATININE 0.91 10/21/2017 1723   CREATININE 1.00 06/02/2014 0910   CALCIUM 8.4 (L) 10/21/2017 1723   CALCIUM 8.6 (L) 06/02/2014 0910   PROT 6.5 10/21/2017 1723   PROT 6.3 (L) 06/02/2014 0910   ALBUMIN 3.4 (L) 10/21/2017 1723   ALBUMIN 3.4 (L) 06/02/2014 0910   AST 28 10/21/2017 1723   AST 23 06/02/2014 0910   ALT 9 10/21/2017 1723   ALT 10 (L) 06/02/2014 0910   ALKPHOS 60 10/21/2017 1723   ALKPHOS 46 06/02/2014 0910   BILITOT 0.7 10/21/2017 1723   BILITOT 0.5 06/02/2014 0910   GFRNONAA 55 (L) 10/21/2017 1723   GFRNONAA 52 (L) 06/02/2014 0910   GFRAA >60 10/21/2017 1723    GFRAA >60 06/02/2014 0910    No results found for: SPEP, UPEP  Lab Results  Component Value Date   WBC 9.2 10/21/2017   NEUTROABS 7.1 (H) 10/21/2017   HGB 11.4 (L) 10/21/2017   HCT 33.6 (L) 10/21/2017   MCV 93.2 10/21/2017   PLT  338 10/21/2017      Chemistry      Component Value Date/Time   NA 136 10/21/2017 1723   NA 138 06/02/2014 0910   K 3.9 10/21/2017 1723   K 3.7 06/02/2014 0910   CL 103 10/21/2017 1723   CL 107 06/02/2014 0910   CO2 25 10/21/2017 1723   CO2 26 06/02/2014 0910   BUN 17 10/21/2017 1723   BUN 17 06/02/2014 0910   CREATININE 0.91 10/21/2017 1723   CREATININE 1.00 06/02/2014 0910      Component Value Date/Time   CALCIUM 8.4 (L) 10/21/2017 1723   CALCIUM 8.6 (L) 06/02/2014 0910   ALKPHOS 60 10/21/2017 1723   ALKPHOS 46 06/02/2014 0910   AST 28 10/21/2017 1723   AST 23 06/02/2014 0910   ALT 9 10/21/2017 1723   ALT 10 (L) 06/02/2014 0910   BILITOT 0.7 10/21/2017 1723   BILITOT 0.5 06/02/2014 0910       RADIOGRAPHIC STUDIES: I have personally reviewed the radiological images as listed and agreed with the findings in the report. No results found.   ASSESSMENT & PLAN:  Malignant neoplasm of right fallopian tube [facility]) # Metastatic fallopian tube/platinum sensitive high-grade serous-cancer; with right inguinal lymph node metastases- satus post radiation to right inguinal finished middle of June. SEP 2019- CT- mixed response with improvement of right inguinal lymph nodes post radiation; however increasing right pelvic adenopathy; and increasing left inguinal adenopathy [approximately 65m].   #I had a long discussion with the patient regarding the progression noted on the imaging which seems to be local/lymphadenopathy; without obvious evidence of any ascites or significant omental/peritoneal metastasis.  I had a long discussion the patient/daughter that she would likely progress in the next few months when she will be more symptomatic.  However,  patient is borderline candidate for any further treatments.  Patient also reluctant with treatments given the side effect especially fatigue.  #Right lower extremity lymphedema-second malignancy status post radiation-post physical therapy improving.  #Acute right lower lung PE on Eliquis; patient will need continued anticoagulation.  However this has to be balanced with risk of falls.  #TIA-at least x2; recently 2 weeks ago.  Patient understand that she is at risk of stroke given repeated TIAs.  Not a candidate for Avastin.  #Follow-up in 3 months/labs/port flush.   Cc; Dr.Thies.    Orders Placed This Encounter  Procedures  . CBC with Differential    Standing Status:   Future    Standing Expiration Date:   10/29/2018  . Comprehensive metabolic panel    Standing Status:   Future    Standing Expiration Date:   10/29/2018  . CA 125    Standing Status:   Future    Standing Expiration Date:   10/29/2018   All questions were answered. The patient knows to call the clinic with any problems, questions or concerns.      GCammie Sickle MD 10/29/2017 9:52 AM

## 2017-10-28 NOTE — Assessment & Plan Note (Addendum)
#   Metastatic fallopian tube/platinum sensitive high-grade serous-cancer; with right inguinal lymph node metastases- satus post radiation to right inguinal finished middle of June. SEP 2019- CT- mixed response with improvement of right inguinal lymph nodes post radiation; however increasing right pelvic adenopathy; and increasing left inguinal adenopathy [approximately 13mm].   #I had a long discussion with the patient regarding the progression noted on the imaging which seems to be local/lymphadenopathy; without obvious evidence of any ascites or significant omental/peritoneal metastasis.  I had a long discussion the patient/daughter that she would likely progress in the next few months when she will be more symptomatic.  However, patient is borderline candidate for any further treatments.  Patient also reluctant with treatments given the side effect especially fatigue.  #Right lower extremity lymphedema-second malignancy status post radiation-post physical therapy improving.  #Acute right lower lung PE on Eliquis; patient will need continued anticoagulation.  However this has to be balanced with risk of falls.  #TIA-at least x2; recently 2 weeks ago.  Patient understand that she is at risk of stroke given repeated TIAs.  Not a candidate for Avastin.  #Follow-up in 3 months/labs/port flush.   Cc; Dr.Thies.  

## 2017-11-06 DIAGNOSIS — M810 Age-related osteoporosis without current pathological fracture: Secondary | ICD-10-CM | POA: Diagnosis not present

## 2017-11-06 DIAGNOSIS — E538 Deficiency of other specified B group vitamins: Secondary | ICD-10-CM | POA: Diagnosis not present

## 2017-11-06 DIAGNOSIS — J301 Allergic rhinitis due to pollen: Secondary | ICD-10-CM | POA: Diagnosis not present

## 2017-11-06 DIAGNOSIS — E1159 Type 2 diabetes mellitus with other circulatory complications: Secondary | ICD-10-CM | POA: Diagnosis not present

## 2017-11-06 DIAGNOSIS — I471 Supraventricular tachycardia: Secondary | ICD-10-CM | POA: Diagnosis not present

## 2017-11-06 DIAGNOSIS — Z23 Encounter for immunization: Secondary | ICD-10-CM | POA: Diagnosis not present

## 2017-11-06 DIAGNOSIS — G620 Drug-induced polyneuropathy: Secondary | ICD-10-CM | POA: Diagnosis not present

## 2017-11-06 DIAGNOSIS — E1122 Type 2 diabetes mellitus with diabetic chronic kidney disease: Secondary | ICD-10-CM | POA: Diagnosis not present

## 2017-11-06 DIAGNOSIS — R69 Illness, unspecified: Secondary | ICD-10-CM | POA: Diagnosis not present

## 2017-11-06 DIAGNOSIS — E559 Vitamin D deficiency, unspecified: Secondary | ICD-10-CM | POA: Diagnosis not present

## 2017-11-06 DIAGNOSIS — K219 Gastro-esophageal reflux disease without esophagitis: Secondary | ICD-10-CM | POA: Diagnosis not present

## 2017-11-11 ENCOUNTER — Ambulatory Visit: Payer: Medicare HMO | Admitting: Obstetrics and Gynecology

## 2017-11-11 ENCOUNTER — Encounter: Payer: Self-pay | Admitting: Obstetrics and Gynecology

## 2017-11-11 VITALS — BP 121/70 | HR 70 | Ht 60.0 in | Wt 139.7 lb

## 2017-11-11 DIAGNOSIS — Z90710 Acquired absence of both cervix and uterus: Secondary | ICD-10-CM | POA: Diagnosis not present

## 2017-11-11 DIAGNOSIS — C5700 Malignant neoplasm of unspecified fallopian tube: Secondary | ICD-10-CM

## 2017-11-11 DIAGNOSIS — N8111 Cystocele, midline: Secondary | ICD-10-CM | POA: Diagnosis not present

## 2017-11-11 DIAGNOSIS — M7989 Other specified soft tissue disorders: Secondary | ICD-10-CM

## 2017-11-11 DIAGNOSIS — Z4689 Encounter for fitting and adjustment of other specified devices: Secondary | ICD-10-CM | POA: Diagnosis not present

## 2017-11-11 DIAGNOSIS — M79661 Pain in right lower leg: Secondary | ICD-10-CM | POA: Diagnosis not present

## 2017-11-11 DIAGNOSIS — Z90721 Acquired absence of ovaries, unilateral: Secondary | ICD-10-CM

## 2017-11-11 DIAGNOSIS — R339 Retention of urine, unspecified: Secondary | ICD-10-CM | POA: Diagnosis not present

## 2017-11-11 NOTE — Patient Instructions (Signed)
1.  Return in 6 months for follow-up with Dr. Evans 

## 2017-11-12 NOTE — Progress Notes (Signed)
Chief complaint: 1.  Pessary maintenance 2.  History of fallopian tube cancer  Lariah presents today for six-month pessary maintenance. Pessary type: Ring with diaphragm support Indications: Second-degree midline cystocele; incomplete bladder emptying Medications: None Symptom review: Patient reports no significant vaginal bleeding, vaginal discharge, pelvic pain, or vaginal odor. Comorbidities: History of recurrent UTI; history of fallopian tube cancer, status post hysterectomy with oophorectomy, status post chemotherapy, currently undergoing radiation therapy only, ongoing problems include chronic right leg lymphedema.  History of cardiac embolism in December 2018 with MI, history of TIAs. At last visit extensive discussion regarding end-of-life issues was performed.  Past medical history, past surgical history, problem list, medications, and allergies are reviewed  OBJECTIVE: BP 121/70   Pulse 70   Ht 5' (1.524 m)   Wt 139 lb 11.2 oz (63.4 kg)   BMI 27.28 kg/m  Pleasant frail elderly female in no acute distress.  Alert and oriented. Abdomen: Soft, nontender without organomegaly Bladder: Nontender Lymph node survey: 3 x 6 cm area of matted lymph nodes are noted in the right groin, slightly tender External genitalia-atrophic changes BUS-urethral caruncle Vagina-moderate atrophy; vaginal cuff is intact; no significant vaginal discharge or ulceration Cervix-surgically absent Uterus-surgically absent Bimanual-no palpable masses or tenderness are appreciated Rectovaginal-normal external exam Extremities: Marketed lymphedema right leg with surgical support hose on.  PROCEDURE: Pessary is removed, cleaned, and reinserted.  ASSESSMENT: 1.  Grade 2 cystocele, with history of incomplete bladder emptying, corrected with use of ring with diaphragm support pessary 2.  Normal pessary maintenance 3.  History of fallopian tube cancer with recurrence in the right inguinal region; personal  decision is made to discontinue chemotherapy at this time; mild clinical changes have occurred with patient now undergoing right inguinal radiation with sequela  of right leg lymphedema  PLAN: 1.  Ring with support pessary is removed, cleaned, and reinserted 2.  Return in 6 months for follow-up or sooner if vaginal discharge, vaginal bleeding, or pelvic pain develop 3.  No intravaginal medications are currently being used  A total of 15 minutes were spent face-to-face with the patient during this encounter and over half of that time dealt with counseling and coordination of care.  Brayton Mars, MD  Note: This dictation was prepared with Dragon dictation along with smaller phrase technology. Any transcriptional errors that result from this process are unintentional.

## 2017-11-13 ENCOUNTER — Encounter: Payer: Medicare HMO | Admitting: Obstetrics and Gynecology

## 2017-11-13 IMAGING — CR DG HIP (WITH OR WITHOUT PELVIS) 2-3V*R*
3 series · 3 of 3 positions shown · non-contrast
Comparison: 02/15/2016 CT

CLINICAL DATA: Fall 1 week ago.  Right hip pain.

EXAM:
DG HIP (WITH OR WITHOUT PELVIS) 2-3V RIGHT

[pelvis ap]
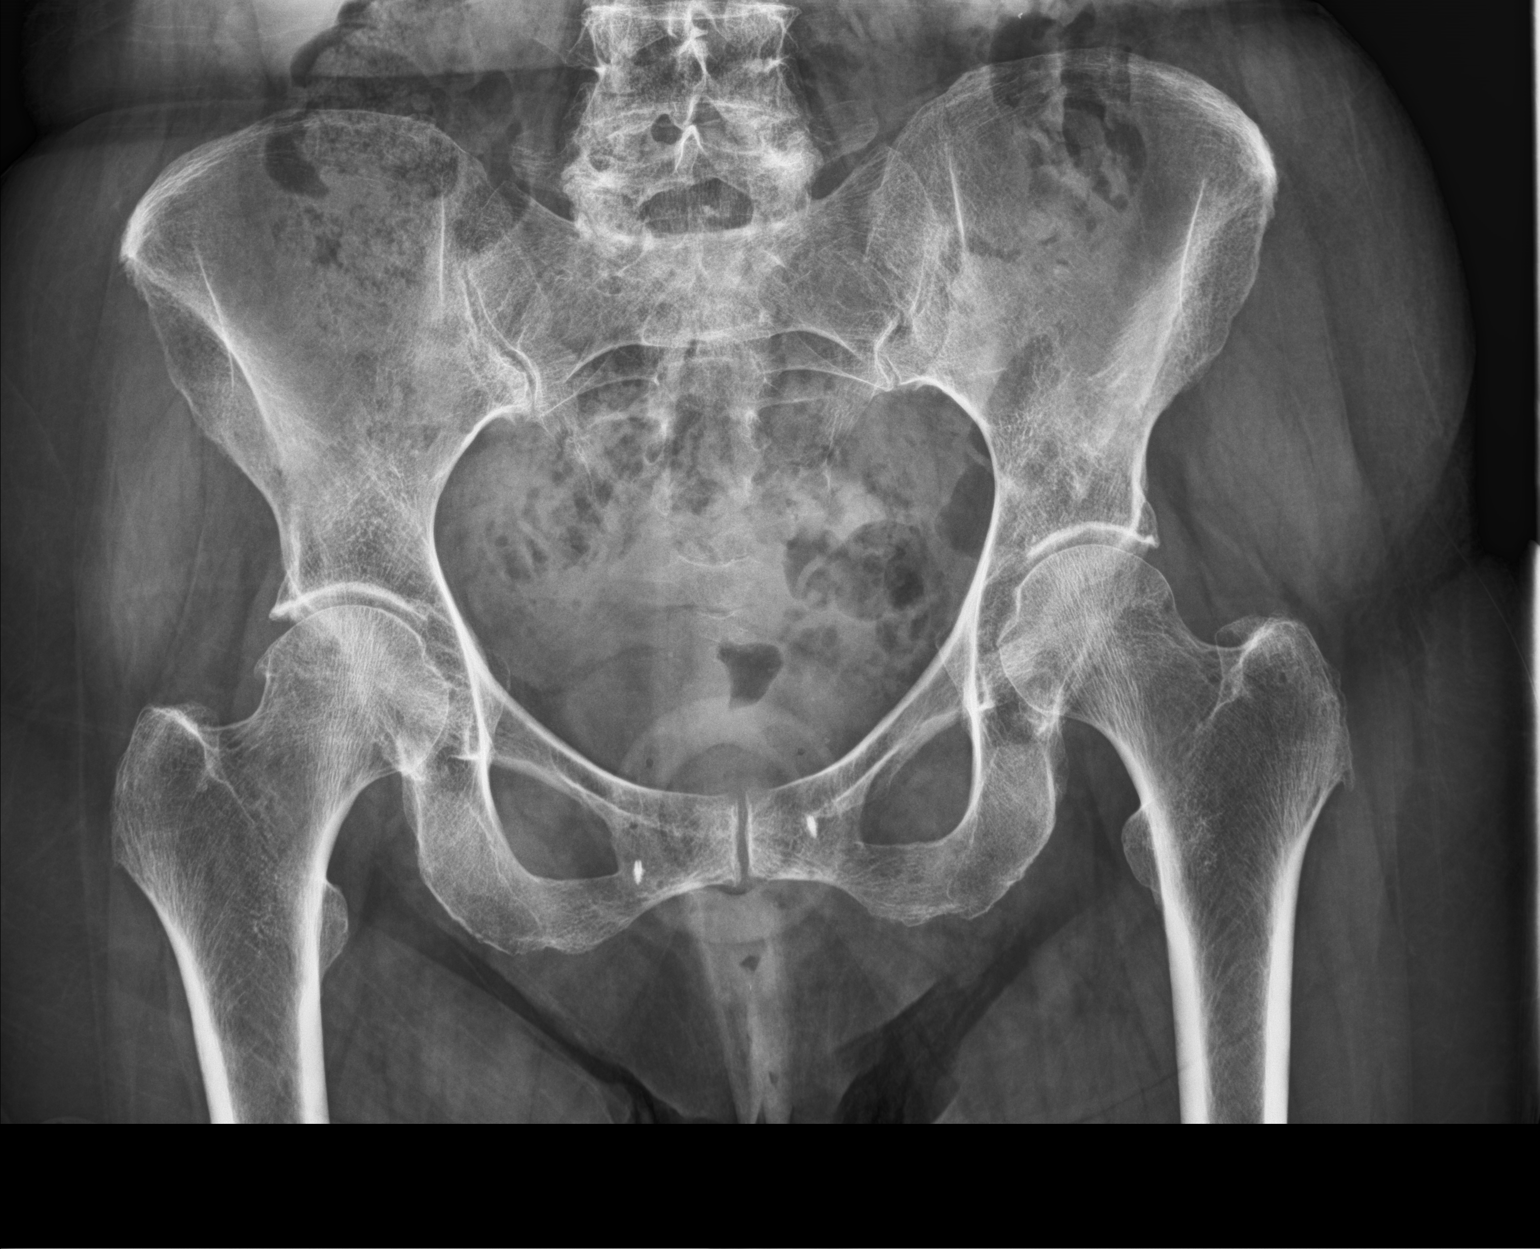

[hip ap]
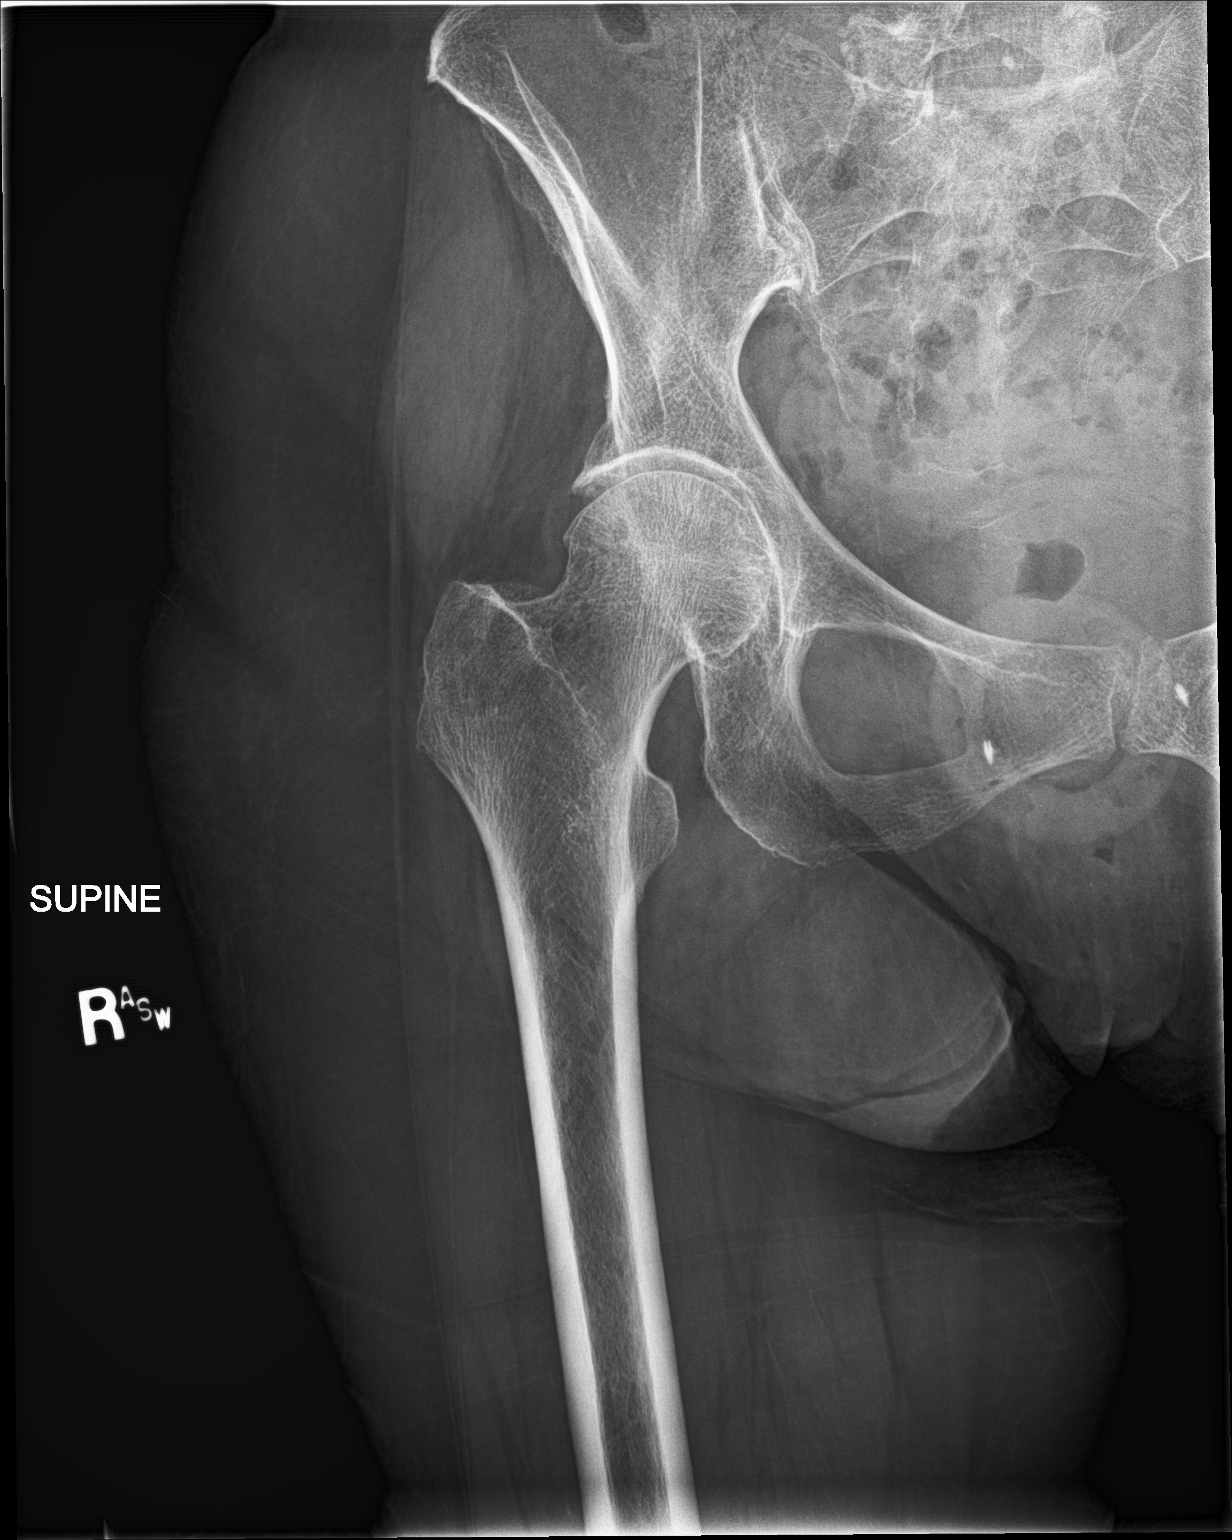

[hip lat]
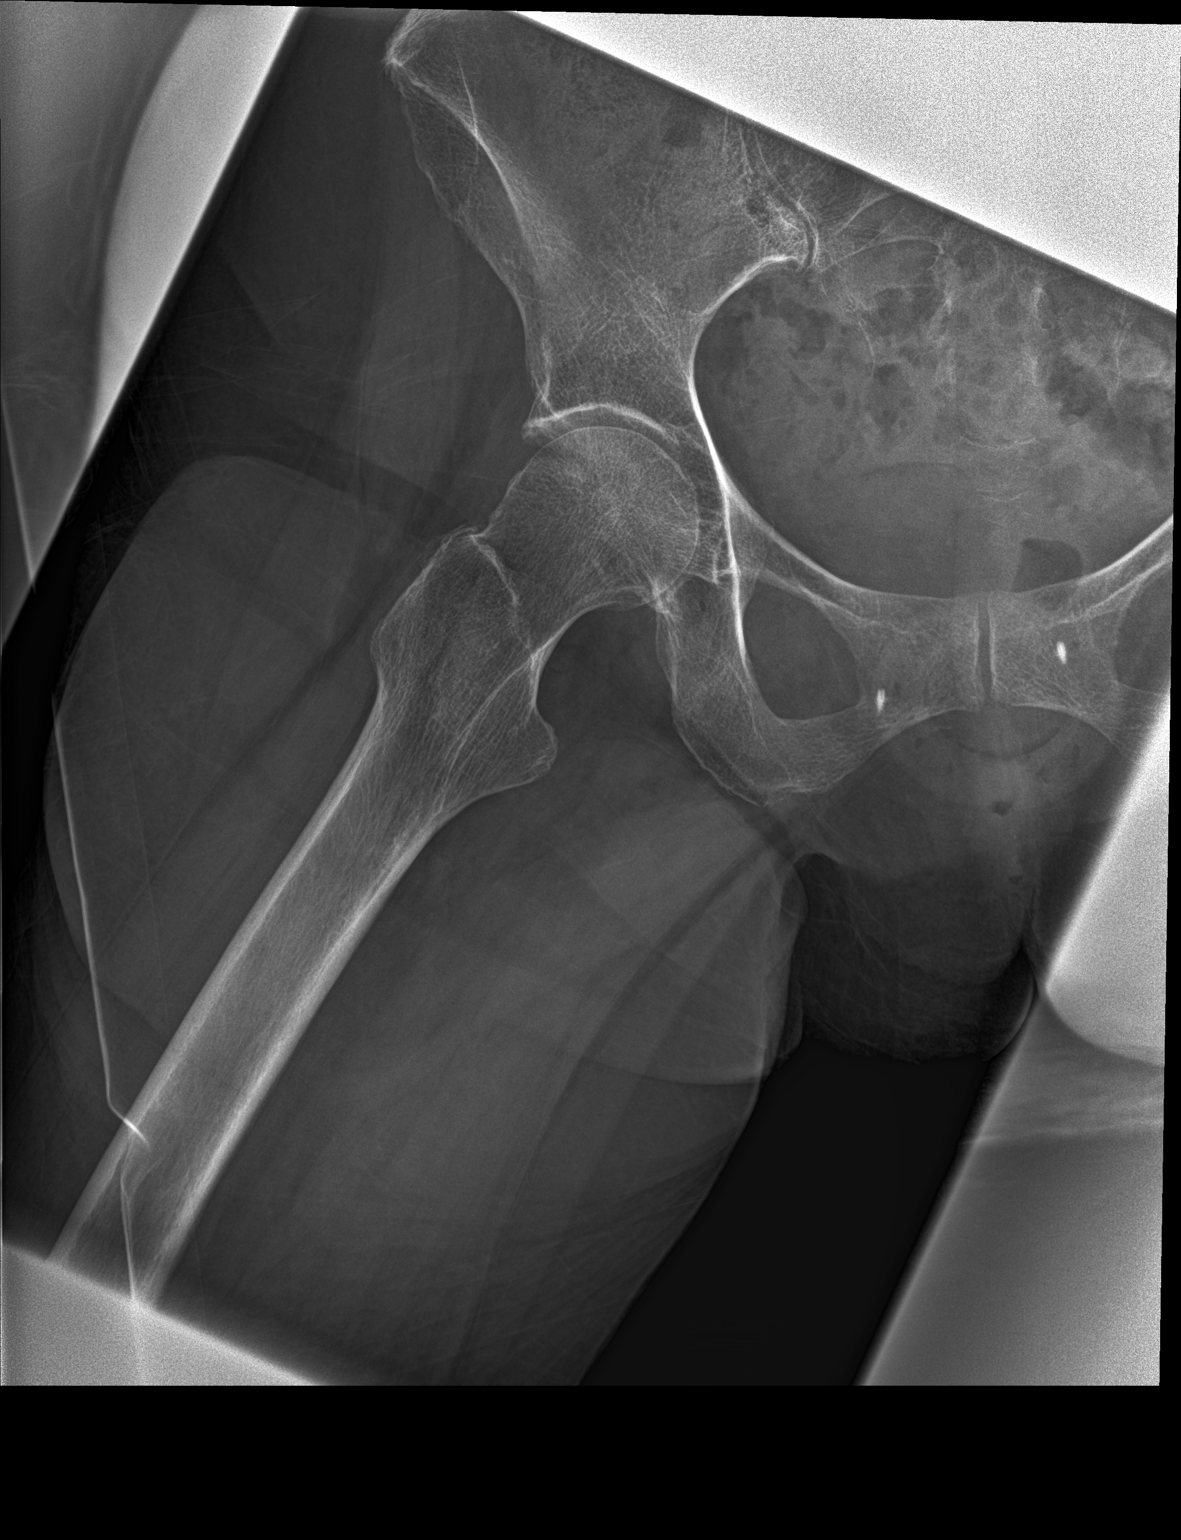

[3 of 3 positions shown; findings below may reference images not displayed]

FINDINGS: AP view the pelvis and AP/frog leg views of the right hip. Femoral
heads are located. Sacroiliac joints are symmetric. Mild
degenerative changes involve the right hip. Minimal joint space
narrowing with osteophyte formation about the femoral head
laterally. No acute fracture.
IMPRESSION: No acute osseous abnormality.

## 2017-12-15 DIAGNOSIS — M79671 Pain in right foot: Secondary | ICD-10-CM | POA: Diagnosis not present

## 2017-12-15 DIAGNOSIS — M7741 Metatarsalgia, right foot: Secondary | ICD-10-CM | POA: Diagnosis not present

## 2017-12-15 DIAGNOSIS — M7742 Metatarsalgia, left foot: Secondary | ICD-10-CM | POA: Diagnosis not present

## 2017-12-15 DIAGNOSIS — B351 Tinea unguium: Secondary | ICD-10-CM | POA: Diagnosis not present

## 2017-12-15 DIAGNOSIS — M79672 Pain in left foot: Secondary | ICD-10-CM | POA: Diagnosis not present

## 2017-12-15 DIAGNOSIS — I87303 Chronic venous hypertension (idiopathic) without complications of bilateral lower extremity: Secondary | ICD-10-CM | POA: Diagnosis not present

## 2017-12-17 DIAGNOSIS — Z961 Presence of intraocular lens: Secondary | ICD-10-CM | POA: Diagnosis not present

## 2018-01-14 ENCOUNTER — Ambulatory Visit: Payer: Medicare HMO | Admitting: Occupational Therapy

## 2018-01-20 ENCOUNTER — Inpatient Hospital Stay: Payer: Medicare HMO

## 2018-01-20 ENCOUNTER — Inpatient Hospital Stay: Payer: Medicare HMO | Admitting: Internal Medicine

## 2018-01-20 NOTE — Progress Notes (Unsigned)
[facility] OFFICE PROGRESS NOTE  Patient Care Team: Ezequiel Kayser, [name]  Cancer Staging No matching staging information was found for the patient.   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  Adequate TRS, no macroscopic residual. IP/IV chemotherapy with DDP and paclitaxel on GOG protocol, chemo and Bev consolidation completed in 03/2011 2. 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 November 14, 2011- Patient was started on carboplatin and gemcitabine 4. Finished 6 cycles of chemotherapy in April of 2014 with carboplatin and gemcitabine. Tolerance was fairly good except for neutropenia and thrombocytopenia. 5.recurrent disease by CT scan February of 2015  # .radiation therapy to pelvis  May of 2015  7.upper extremity  . and deep vein thrombosis associated with port.(June of 2015) patient started on   [place] had port removed and had   thrombectomy September 06, 2013. # CT DEC 2016- progressing disease in the right inguinal area # FEB-TAXOL-AVASTIN;  # FEB 2017- AVASTIN q 3W; July 2017- CT- Improved  Right Inguinal LN; STOP AVASTIN sec to potential concerns of AEs  # AUG 2017 3rd- START ZEJULA- declines sec to intol  # OCT 7th CT- STABLE RIGHT INGUINAL LN- RE-START AVASTIN q 3W [STOPPED oct 25th 2018]; Avastin discontinued October 2018 [stroke/PE]  # MAY 2019- Right inguinal adenopathy progression status post radiation [June 2019]   #TIA x2 [name] 2019 last]; PE [[facility]]; right lower extremity lymphedema-radiation/malignancy  ------------------------------------------------ MOLECULAR TESTING: somatic BRCA negative; F-ONE: No targets*   Dx: SEP 2013  fallopian tube/platinum sensitive high-grade serous-cancer  Current/most recent therapy-surveillance.  Goals: palliative; stage: Metastatic/Recurrent - IV          Malignant neoplasm of right fallopian tube [facility]      INTERVAL HISTORY:  Nicole Clayton 84 y.o.  female pleasant patient above history of metastatic fallopian tube high-grade serous most recently status post radiation to her right inguinal region is here for follow-up/review the results of the CT scan.  Patient was recently evaluated in the hospital for TIA when she had weakness of her right side of the body; MRI brain MRA neck negative for any acute stroke.  Patient has been working with lymphedema specialist/physical therapy for her right lower extremity swelling.  Overall improving.  Continues to have mild to moderate fatigue.  Denies any worsening constipation.  Denies any worsening abdominal distention. With  Review of Systems  Constitutional: Positive for malaise/fatigue. Negative for chills, diaphoresis, fever and weight loss.  HENT: Negative for nosebleeds and sore throat.   Eyes: Negative for double vision.  Respiratory: Negative for cough, hemoptysis, sputum production, shortness of breath and wheezing.   Cardiovascular: Positive for leg swelling. Negative for chest pain, palpitations and orthopnea.  Gastrointestinal: Negative for abdominal pain, blood in stool, constipation, diarrhea, heartburn, melena, nausea and vomiting.  Genitourinary: Negative for dysuria, frequency and urgency.  Musculoskeletal: Negative for back pain and joint pain.  Skin: Negative.  Negative for itching and rash.  Neurological: Negative for dizziness, tingling, focal weakness, weakness and headaches.  Endo/Heme/Allergies: Does not bruise/bleed easily.  Psychiatric/Behavioral: Negative for depression. The patient is not nervous/anxious and does not have insomnia.       PAST MEDICAL HISTORY :  Past Medical History:  Diagnosis Date  . Bladder infection, chronic 10/19/2011  . Cancer (HCC)    Ovarian   . Carcinoma of fallopian tube ([place]) 10/05/2009  Overview:  Overview:  Overview:  Drs. Claiborne Rigg  and Delorise Shiner Choksi Overview:  Drs. Claiborne Rigg and [employer]   . Concussion   . GERD (gastroesophageal reflux disease)   . MI (mitral incompetence) 10/27/2014   Overview:  MODERATE   . Mitral valve prolapse   . OAB (overactive bladder)   . Ovarian cancer ([place])   . Pulmonary embolism ([place])   . Tachycardia     PAST SURGICAL HISTORY :   Past Surgical History:  Procedure Laterality Date  . ABDOMINAL HYSTERECTOMY    . CHOLECYSTECTOMY    . LAPAROSCOPIC BILATERAL SALPINGO OOPHERECTOMY  2011  . ovarian cancer surgery  2011    FAMILY HISTORY :   Family History  Problem Relation Age of Onset  . Ovarian cancer Other   . Breast cancer Neg Hx   . Colon cancer Neg Hx   . Diabetes Neg Hx   . Heart disease Neg Hx   . Kidney disease Neg Hx   . Bladder Cancer Neg Hx     SOCIAL HISTORY:   Social History   Tobacco Use  . Smoking status: Never Smoker  . Smokeless tobacco: Never Used  Substance Use Topics  . Alcohol use: No  . Drug use: No    ALLERGIES:  is allergic to benadryl [diphenhydramine]; tamsulosin; alendronate sodium; duloxetine; duloxetine hcl; risedronate sodium; lovastatin; and sulfa antibiotics.  MEDICATIONS:  Current Outpatient Medications  Medication Sig Dispense Refill  . acetaminophen (TYLENOL) 650 MG CR tablet Take 650 mg by mouth 2 (two) times daily as needed for pain.     Marland Kitchen apixaban (ELIQUIS) 5 MG TABS tablet Take 1 tablet (5 mg total) by mouth 2 (two) times daily. 60 tablet 5  . calcium carbonate (TUMS - DOSED IN MG ELEMENTAL CALCIUM) 500 MG chewable tablet Chew 2 tablets by mouth every 2 (two) hours as needed for indigestion or heartburn.    . cetirizine (ZYRTEC) 10 MG tablet Take 10 mg by mouth daily.     . cholecalciferol (VITAMIN D) 1000 units tablet Take 1,000 Units by mouth daily.     Marland Kitchen docusate sodium (COLACE) 100 MG capsule Take 100 mg by mouth daily as needed for mild constipation.     . fluticasone (FLONASE) 50 MCG/ACT nasal spray Place 2 sprays  into both nostrils daily.     Marland Kitchen gabapentin (NEURONTIN) 600 MG tablet Take 600 mg by mouth every morning.    . metoprolol succinate (TOPROL-XL) 50 MG 24 hr tablet Take 50 mg by mouth daily.     . Multiple Vitamin (MULTIVITAMIN WITH MINERALS) TABS tablet Take 1 tablet by mouth daily.    . nitrofurantoin, macrocrystal-monohydrate, (MACROBID) 100 MG capsule Take 100 mg by mouth 2 (two) times daily.    . ondansetron (ZOFRAN) 4 MG tablet Take 4 mg by mouth every 8 (eight) hours as needed for nausea or vomiting.    . pantoprazole (PROTONIX) 40 MG tablet Take 40 mg by mouth daily.     . vitamin B-12 (CYANOCOBALAMIN) 500 MCG tablet Take 500 mcg by mouth daily.    . vitamin C (ASCORBIC ACID) 500 MG tablet Take 500 mg by mouth daily.      No current facility-administered medications for this visit.    Facility-Administered Medications Ordered in Other Visits  Medication Dose Route Frequency Provider Last Rate Last Dose  . diphenhydrAMINE (BENADRYL) injection 50 mg  50 mg Intravenous Once Choksi, Janak, MD      . sodium chloride 0.9 %  injection 10 mL  10 mL Intravenous PRN Forest Gleason, MD   10 mL at 01/12/15 1116  . sodium chloride 0.9 % injection 10 mL  10 mL Intravenous PRN Forest Gleason, MD   10 mL at 02/16/15 1025    PHYSICAL EXAMINATION: ECOG PERFORMANCE STATUS: 1 - Symptomatic but completely ambulatory  There were no vitals taken for this visit.  There were no vitals filed for this visit.  Physical Exam  Constitutional: She is oriented to person, place, and time.  Frail-appearing Caucasian female patient.  She is in a wheelchair.  Accompanied by daughter.  HENT:  Head: Normocephalic and atraumatic.  Mouth/Throat: Oropharynx is clear and moist. No oropharyngeal exudate.  Eyes: Pupils are equal, round, and reactive to light.  Neck: Normal range of motion. Neck supple.  Cardiovascular: Normal rate and regular rhythm.  Pulmonary/Chest: Breath sounds normal. No respiratory distress. She has  no wheezes.  Abdominal: Soft. Bowel sounds are normal. She exhibits no distension and no mass. There is no abdominal tenderness. There is no rebound and no guarding.  Musculoskeletal: Normal range of motion.        General: No tenderness or edema.     Comments: Bilateral lower extremity swelling right more than left.  Neurological: She is alert and oriented to person, place, and time.  Skin: Skin is warm.  Psychiatric: Affect normal.       LABORATORY DATA:  I have reviewed the data as listed    Component Value Date/Time   NA 136 10/21/2017 1723   NA 138 06/02/2014 0910   K 3.9 10/21/2017 1723   K 3.7 06/02/2014 0910   CL 103 10/21/2017 1723   CL 107 06/02/2014 0910   CO2 25 10/21/2017 1723   CO2 26 06/02/2014 0910   GLUCOSE 138 (H) 10/21/2017 1723   GLUCOSE 106 (H) 06/02/2014 0910   BUN 17 10/21/2017 1723   BUN 17 06/02/2014 0910   CREATININE 0.91 10/21/2017 1723   CREATININE 1.00 06/02/2014 0910   CALCIUM 8.4 (L) 10/21/2017 1723   CALCIUM 8.6 (L) 06/02/2014 0910   PROT 6.5 10/21/2017 1723   PROT 6.3 (L) 06/02/2014 0910   ALBUMIN 3.4 (L) 10/21/2017 1723   ALBUMIN 3.4 (L) 06/02/2014 0910   AST 28 10/21/2017 1723   AST 23 06/02/2014 0910   ALT 9 10/21/2017 1723   ALT 10 (L) 06/02/2014 0910   ALKPHOS 60 10/21/2017 1723   ALKPHOS 46 06/02/2014 0910   BILITOT 0.7 10/21/2017 1723   BILITOT 0.5 06/02/2014 0910   GFRNONAA 55 (L) 10/21/2017 1723   GFRNONAA 52 (L) 06/02/2014 0910   GFRAA >60 10/21/2017 1723   GFRAA >60 06/02/2014 0910    No results found for: SPEP, UPEP  Lab Results  Component Value Date   WBC 9.2 10/21/2017   NEUTROABS 7.1 (H) 10/21/2017   HGB 11.4 (L) 10/21/2017   HCT 33.6 (L) 10/21/2017   MCV 93.2 10/21/2017   PLT 338 10/21/2017      Chemistry      Component Value Date/Time   NA 136 10/21/2017 1723   NA 138 06/02/2014 0910   K 3.9 10/21/2017 1723   K 3.7 06/02/2014 0910   CL 103 10/21/2017 1723   CL 107 06/02/2014 0910   CO2 25  10/21/2017 1723   CO2 26 06/02/2014 0910   BUN 17 10/21/2017 1723   BUN 17 06/02/2014 0910   CREATININE 0.91 10/21/2017 1723   CREATININE 1.00 06/02/2014 0910  Component Value Date/Time   CALCIUM 8.4 (L) 10/21/2017 1723   CALCIUM 8.6 (L) 06/02/2014 0910   ALKPHOS 60 10/21/2017 1723   ALKPHOS 46 06/02/2014 0910   AST 28 10/21/2017 1723   AST 23 06/02/2014 0910   ALT 9 10/21/2017 1723   ALT 10 (L) 06/02/2014 0910   BILITOT 0.7 10/21/2017 1723   BILITOT 0.5 06/02/2014 0910       RADIOGRAPHIC STUDIES: I have personally reviewed the radiological images as listed and agreed with the findings in the report. No results found.   ASSESSMENT & PLAN:  No problem-specific Assessment & Plan notes found for this encounter.   No orders of the defined types were placed in this encounter.  All questions were answered. The patient knows to call the clinic with any problems, questions or concerns.      [name], MD 01/20/2018 8:04 AM

## 2018-01-20 NOTE — Assessment & Plan Note (Signed)
#   Metastatic fallopian tube/platinum sensitive high-grade serous-cancer; with right inguinal lymph node metastases- satus post radiation to right inguinal finished middle of June. SEP 2019- CT- mixed response with improvement of right inguinal lymph nodes post radiation; however increasing right pelvic adenopathy; and increasing left inguinal adenopathy [approximately 24mm].   #I had a long discussion with the patient regarding the progression noted on the imaging which seems to be local/lymphadenopathy; without obvious evidence of any ascites or significant omental/peritoneal metastasis.  I had a long discussion the patient/daughter that she would likely progress in the next few months when she will be more symptomatic.  However, patient is borderline candidate for any further treatments.  Patient also reluctant with treatments given the side effect especially fatigue.  #Right lower extremity lymphedema-second malignancy status post radiation-post physical therapy improving.  #Acute right lower lung PE on Eliquis; patient will need continued anticoagulation.  However this has to be balanced with risk of falls.  #TIA-at least x2; recently 2 weeks ago.  Patient understand that she is at risk of stroke given repeated TIAs.  Not a candidate for Avastin.  #Follow-up in 3 months/labs/port flush.   Cc; Dr.Thies.

## 2018-02-11 ENCOUNTER — Other Ambulatory Visit: Payer: Self-pay

## 2018-02-11 DIAGNOSIS — C5701 Malignant neoplasm of right fallopian tube: Secondary | ICD-10-CM

## 2018-02-17 ENCOUNTER — Inpatient Hospital Stay (HOSPITAL_BASED_OUTPATIENT_CLINIC_OR_DEPARTMENT_OTHER): Payer: Medicare HMO | Admitting: Internal Medicine

## 2018-02-17 ENCOUNTER — Other Ambulatory Visit: Payer: Self-pay

## 2018-02-17 ENCOUNTER — Ambulatory Visit
Admission: RE | Admit: 2018-02-17 | Discharge: 2018-02-17 | Disposition: A | Discharge status: Home / Self Care | Payer: Medicare HMO | Attending: Internal Medicine | Admitting: Internal Medicine

## 2018-02-17 ENCOUNTER — Inpatient Hospital Stay: Payer: Medicare HMO | Attending: Internal Medicine

## 2018-02-17 ENCOUNTER — Ambulatory Visit
Admission: RE | Admit: 2018-02-17 | Discharge: 2018-02-17 | Disposition: A | Discharge status: Home / Self Care | Payer: Medicare HMO | Source: Ambulatory Visit | Attending: Internal Medicine | Admitting: Internal Medicine

## 2018-02-17 ENCOUNTER — Encounter: Payer: Self-pay | Admitting: Internal Medicine

## 2018-02-17 VITALS — BP 112/57 | HR 68 | Temp 98.0°F | Resp 18

## 2018-02-17 DIAGNOSIS — C774 Secondary and unspecified malignant neoplasm of inguinal and lower limb lymph nodes: Secondary | ICD-10-CM

## 2018-02-17 DIAGNOSIS — Z9079 Acquired absence of other genital organ(s): Secondary | ICD-10-CM | POA: Insufficient documentation

## 2018-02-17 DIAGNOSIS — Z90710 Acquired absence of both cervix and uterus: Secondary | ICD-10-CM | POA: Diagnosis not present

## 2018-02-17 DIAGNOSIS — Z923 Personal history of irradiation: Secondary | ICD-10-CM | POA: Diagnosis not present

## 2018-02-17 DIAGNOSIS — R0609 Other forms of dyspnea: Secondary | ICD-10-CM | POA: Insufficient documentation

## 2018-02-17 DIAGNOSIS — Z9221 Personal history of antineoplastic chemotherapy: Secondary | ICD-10-CM | POA: Diagnosis not present

## 2018-02-17 DIAGNOSIS — M7989 Other specified soft tissue disorders: Secondary | ICD-10-CM | POA: Diagnosis not present

## 2018-02-17 DIAGNOSIS — C5701 Malignant neoplasm of right fallopian tube: Secondary | ICD-10-CM

## 2018-02-17 DIAGNOSIS — R0602 Shortness of breath: Secondary | ICD-10-CM | POA: Diagnosis not present

## 2018-02-17 DIAGNOSIS — I890 Lymphedema, not elsewhere classified: Secondary | ICD-10-CM

## 2018-02-17 DIAGNOSIS — Z86711 Personal history of pulmonary embolism: Secondary | ICD-10-CM | POA: Diagnosis not present

## 2018-02-17 DIAGNOSIS — Z8673 Personal history of transient ischemic attack (TIA), and cerebral infarction without residual deficits: Secondary | ICD-10-CM | POA: Diagnosis not present

## 2018-02-17 DIAGNOSIS — I2699 Other pulmonary embolism without acute cor pulmonale: Secondary | ICD-10-CM

## 2018-02-17 DIAGNOSIS — Z90722 Acquired absence of ovaries, bilateral: Secondary | ICD-10-CM | POA: Insufficient documentation

## 2018-02-17 DIAGNOSIS — Z7901 Long term (current) use of anticoagulants: Secondary | ICD-10-CM | POA: Diagnosis not present

## 2018-02-17 DIAGNOSIS — Z8672 Personal history of thrombophlebitis: Secondary | ICD-10-CM

## 2018-02-17 DIAGNOSIS — R05 Cough: Secondary | ICD-10-CM | POA: Diagnosis not present

## 2018-02-17 DIAGNOSIS — Z79899 Other long term (current) drug therapy: Secondary | ICD-10-CM | POA: Insufficient documentation

## 2018-02-17 LAB — CBC WITH DIFFERENTIAL/PLATELET
Abs Immature Granulocytes: 0 10*3/uL (ref 0.00–0.07)
BASOS ABS: 0 10*3/uL (ref 0.0–0.1)
BASOS PCT: 1 %
EOS ABS: 0.1 10*3/uL (ref 0.0–0.5)
Eosinophils Relative: 2 %
HCT: 36.4 % (ref 36.0–46.0)
Hemoglobin: 11.9 g/dL — ABNORMAL LOW (ref 12.0–15.0)
Immature Granulocytes: 0 %
Lymphocytes Relative: 23 %
Lymphs Abs: 1.1 10*3/uL (ref 0.7–4.0)
MCH: 30.9 pg (ref 26.0–34.0)
MCHC: 32.7 g/dL (ref 30.0–36.0)
MCV: 94.5 fL (ref 80.0–100.0)
Monocytes Absolute: 0.7 10*3/uL (ref 0.1–1.0)
Monocytes Relative: 16 %
NRBC: 0 % (ref 0.0–0.2)
Neutro Abs: 2.8 10*3/uL (ref 1.7–7.7)
Neutrophils Relative %: 58 %
Platelets: 348 10*3/uL (ref 150–400)
RBC: 3.85 MIL/uL — AB (ref 3.87–5.11)
RDW: 13.8 % (ref 11.5–15.5)
WBC: 4.7 10*3/uL (ref 4.0–10.5)

## 2018-02-17 LAB — COMPREHENSIVE METABOLIC PANEL
ALK PHOS: 63 U/L (ref 38–126)
ALT: 10 U/L (ref 0–44)
AST: 21 U/L (ref 15–41)
Albumin: 3.4 g/dL — ABNORMAL LOW (ref 3.5–5.0)
Anion gap: 10 (ref 5–15)
BILIRUBIN TOTAL: 0.8 mg/dL (ref 0.3–1.2)
BUN: 20 mg/dL (ref 8–23)
CALCIUM: 8.8 mg/dL — AB (ref 8.9–10.3)
CHLORIDE: 105 mmol/L (ref 98–111)
CO2: 26 mmol/L (ref 22–32)
CREATININE: 0.97 mg/dL (ref 0.44–1.00)
GFR, EST NON AFRICAN AMERICAN: 53 mL/min — AB (ref >60)
Glucose, Bld: 118 mg/dL — ABNORMAL HIGH (ref 70–99)
Potassium: 4 mmol/L (ref 3.5–5.1)
Sodium: 141 mmol/L (ref 135–145)
TOTAL PROTEIN: 6.8 g/dL (ref 6.5–8.1)

## 2018-02-17 LAB — BRAIN NATRIURETIC PEPTIDE: B NATRIURETIC PEPTIDE 5: 196 pg/mL — AB (ref 0.0–100.0)

## 2018-02-17 NOTE — Progress Notes (Signed)
[facility] OFFICE PROGRESS NOTE  Patient Care Team: Ezequiel Kayser, [name]  Cancer Staging No matching staging information was found for the patient.   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  Adequate TRS, no macroscopic residual. IP/IV chemotherapy with DDP and paclitaxel on GOG protocol, chemo and Bev consolidation completed in 03/2011 2. 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 November 14, 2011- Patient was started on carboplatin and gemcitabine 4. Finished 6 cycles of chemotherapy in April of 2014 with carboplatin and gemcitabine. Tolerance was fairly good except for neutropenia and thrombocytopenia. 5.recurrent disease by CT scan February of 2015  # .radiation therapy to pelvis  May of 2015  7.upper extremity  . and deep vein thrombosis associated with port.(June of 2015) patient started on   [place] had port removed and had   thrombectomy September 06, 2013. # CT DEC 2016- progressing disease in the right inguinal area # FEB-TAXOL-AVASTIN;  # FEB 2017- AVASTIN q 3W; July 2017- CT- Improved  Right Inguinal LN; STOP AVASTIN sec to potential concerns of AEs  # AUG 2017 3rd- START ZEJULA- declines sec to intol  # OCT 7th CT- STABLE RIGHT INGUINAL LN- RE-START AVASTIN q 3W [STOPPED oct 25th 2018]; Avastin discontinued October 2018 [stroke/PE]  # MAY 2019- Right inguinal adenopathy progression status post radiation [June 2019]   #TIA x2 [name] 2019 last]; PE [[facility]]; right lower extremity lymphedema-radiation/malignancy  ------------------------------------------------ MOLECULAR TESTING: somatic BRCA negative; F-ONE: No targets*   Dx: SEP 2013  fallopian tube/platinum sensitive high-grade serous-cancer  Current/most recent therapy-surveillance.  Goals: palliative; stage: Metastatic/Recurrent - IV          Malignant neoplasm of right fallopian tube [facility])      INTERVAL HISTORY:  Nicole Clayton 85 y.o.  female pleasant patient above history of metastatic fallopian tube high-grade serous most recently status post radiation to her right inguinal region is here for follow-up.  Unfortunately patient's husband recently passed away approximately a month ago.  Patient is coping up fairly well.  Patient herself has not had any hospitalizations.  Patient notes to have worsening swelling in the right lower extremity compared to left.  She feels her left groin lymph nodes are swelling up again.  She complains of mild to moderate fatigue.  She complains of worsening shortness of breath with exertion.  Mild cough with phlegm.  Otherwise no fevers or chills.   Review of Systems  Constitutional: Positive for malaise/fatigue. Negative for chills, diaphoresis, fever and weight loss.  HENT: Negative for nosebleeds and sore throat.   Eyes: Negative for double vision.  Respiratory: Positive for cough, sputum production and shortness of breath. Negative for hemoptysis and wheezing.   Cardiovascular: Positive for leg swelling. Negative for chest pain, palpitations and orthopnea.  Gastrointestinal: Negative for abdominal pain, blood in stool, constipation, diarrhea, heartburn, melena, nausea and vomiting.  Genitourinary: Negative for dysuria, frequency and urgency.  Musculoskeletal: Negative for back pain and joint pain.  Skin: Negative.  Negative for itching and rash.  Neurological: Negative for dizziness, tingling, focal weakness, weakness and headaches.  Endo/Heme/Allergies: Does not bruise/bleed easily.  Psychiatric/Behavioral: Negative for depression. The patient is not nervous/anxious and does not have insomnia.       PAST MEDICAL HISTORY :  Past Medical History:  Diagnosis Date  . Bladder infection, chronic 10/19/2011  . Cancer ([place])  Ovarian   . Carcinoma of fallopian tube ([place]) 10/05/2009    Overview:  Overview:  Overview:  Drs. Claiborne Rigg and Delorise Shiner Choksi Overview:  Drs. Claiborne Rigg and [employer]   . Concussion   . GERD (gastroesophageal reflux disease)   . MI (mitral incompetence) 10/27/2014   Overview:  MODERATE   . Mitral valve prolapse   . OAB (overactive bladder)   . Ovarian cancer ([place])   . Pulmonary embolism ([place])   . Tachycardia     PAST SURGICAL HISTORY :   Past Surgical History:  Procedure Laterality Date  . ABDOMINAL HYSTERECTOMY    . CHOLECYSTECTOMY    . LAPAROSCOPIC BILATERAL SALPINGO OOPHERECTOMY  2011  . ovarian cancer surgery  2011    FAMILY HISTORY :   Family History  Problem Relation Age of Onset  . Ovarian cancer Other   . Breast cancer Neg Hx   . Colon cancer Neg Hx   . Diabetes Neg Hx   . Heart disease Neg Hx   . Kidney disease Neg Hx   . Bladder Cancer Neg Hx     SOCIAL HISTORY:   Social History   Tobacco Use  . Smoking status: Never Smoker  . Smokeless tobacco: Never Used  Substance Use Topics  . Alcohol use: No  . Drug use: No    ALLERGIES:  is allergic to benadryl [diphenhydramine]; tamsulosin; alendronate sodium; duloxetine; duloxetine hcl; risedronate sodium; lovastatin; and sulfa antibiotics.  MEDICATIONS:  Current Outpatient Medications  Medication Sig Dispense Refill  . acetaminophen (TYLENOL) 650 MG CR tablet Take 650 mg by mouth 2 (two) times daily as needed for pain.     Marland Kitchen apixaban (ELIQUIS) 5 MG TABS tablet Take 1 tablet (5 mg total) by mouth 2 (two) times daily. 60 tablet 5  . calcium carbonate (TUMS - DOSED IN MG ELEMENTAL CALCIUM) 500 MG chewable tablet Chew 2 tablets by mouth every 2 (two) hours as needed for indigestion or heartburn.    . cetirizine (ZYRTEC) 10 MG tablet Take 10 mg by mouth daily.     . cholecalciferol (VITAMIN D) 1000 units tablet Take 1,000 Units by mouth daily.     Marland Kitchen docusate sodium (COLACE) 100 MG capsule Take 100 mg by mouth daily as needed for mild constipation.     .  fluticasone (FLONASE) 50 MCG/ACT nasal spray Place 2 sprays into both nostrils daily.     Marland Kitchen gabapentin (NEURONTIN) 600 MG tablet Take 600 mg by mouth every morning.    . metoprolol succinate (TOPROL-XL) 50 MG 24 hr tablet Take 50 mg by mouth daily.     . Multiple Vitamin (MULTIVITAMIN WITH MINERALS) TABS tablet Take 1 tablet by mouth daily.    . nitrofurantoin, macrocrystal-monohydrate, (MACROBID) 100 MG capsule Take 100 mg by mouth 2 (two) times daily.    . ondansetron (ZOFRAN) 4 MG tablet Take 4 mg by mouth every 8 (eight) hours as needed for nausea or vomiting.    . pantoprazole (PROTONIX) 40 MG tablet Take 40 mg by mouth daily.     . vitamin B-12 (CYANOCOBALAMIN) 500 MCG tablet Take 500 mcg by mouth daily.    . vitamin C (ASCORBIC ACID) 500 MG tablet Take 500 mg by mouth daily.      No current facility-administered medications for this visit.    Facility-Administered Medications Ordered in Other Visits  Medication Dose Route Frequency Provider Last Rate Last Dose  . diphenhydrAMINE (BENADRYL) injection 50 mg  50 mg Intravenous Once  Forest Gleason, MD      . sodium chloride 0.9 % injection 10 mL  10 mL Intravenous PRN Forest Gleason, MD   10 mL at 01/12/15 1116  . sodium chloride 0.9 % injection 10 mL  10 mL Intravenous PRN Choksi, Delorise Shiner, MD   10 mL at 02/16/15 1025    PHYSICAL EXAMINATION: ECOG PERFORMANCE STATUS: 1 - Symptomatic but completely ambulatory  BP (!) 112/57 (Patient Position: Sitting)   Pulse 68   Temp 98 F (36.7 C) (Oral)   Resp 18   SpO2 98%   There were no vitals filed for this visit.  Physical Exam  Constitutional: She is oriented to person, place, and time.  Frail-appearing Caucasian female patient.  She is in a wheelchair.  Accompanied by daughter.  HENT:  Head: Normocephalic and atraumatic.  Mouth/Throat: Oropharynx is clear and moist. No oropharyngeal exudate.  Eyes: Pupils are equal, round, and reactive to light.  Neck: Normal range of motion. Neck  supple.  Cardiovascular: Normal rate and regular rhythm.  Pulmonary/Chest: Breath sounds normal. No respiratory distress. She has no wheezes.  Abdominal: Soft. Bowel sounds are normal. She exhibits no distension and no mass. There is no abdominal tenderness. There is no rebound and no guarding.  Musculoskeletal: Normal range of motion.        General: No tenderness or edema.     Comments: Bilateral lower extremity swelling right more than left.  Neurological: She is alert and oriented to person, place, and time.  Skin: Skin is warm.  Psychiatric: Affect normal.       LABORATORY DATA:  I have reviewed the data as listed    Component Value Date/Time   NA 141 02/17/2018 1040   NA 138 06/02/2014 0910   K 4.0 02/17/2018 1040   K 3.7 06/02/2014 0910   CL 105 02/17/2018 1040   CL 107 06/02/2014 0910   CO2 26 02/17/2018 1040   CO2 26 06/02/2014 0910   GLUCOSE 118 (H) 02/17/2018 1040   GLUCOSE 106 (H) 06/02/2014 0910   BUN 20 02/17/2018 1040   BUN 17 06/02/2014 0910   CREATININE 0.97 02/17/2018 1040   CREATININE 1.00 06/02/2014 0910   CALCIUM 8.8 (L) 02/17/2018 1040   CALCIUM 8.6 (L) 06/02/2014 0910   PROT 6.8 02/17/2018 1040   PROT 6.3 (L) 06/02/2014 0910   ALBUMIN 3.4 (L) 02/17/2018 1040   ALBUMIN 3.4 (L) 06/02/2014 0910   AST 21 02/17/2018 1040   AST 23 06/02/2014 0910   ALT 10 02/17/2018 1040   ALT 10 (L) 06/02/2014 0910   ALKPHOS 63 02/17/2018 1040   ALKPHOS 46 06/02/2014 0910   BILITOT 0.8 02/17/2018 1040   BILITOT 0.5 06/02/2014 0910   GFRNONAA 53 (L) 02/17/2018 1040   GFRNONAA 52 (L) 06/02/2014 0910   GFRAA >60 02/17/2018 1040   GFRAA >60 06/02/2014 0910    No results found for: SPEP, UPEP  Lab Results  Component Value Date   WBC 4.7 02/17/2018   NEUTROABS 2.8 02/17/2018   HGB 11.9 (L) 02/17/2018   HCT 36.4 02/17/2018   MCV 94.5 02/17/2018   PLT 348 02/17/2018      Chemistry      Component Value Date/Time   NA 141 02/17/2018 1040   NA 138 06/02/2014  0910   K 4.0 02/17/2018 1040   K 3.7 06/02/2014 0910   CL 105 02/17/2018 1040   CL 107 06/02/2014 0910   CO2 26 02/17/2018 1040   CO2 26 06/02/2014  0910   BUN 20 02/17/2018 1040   BUN 17 06/02/2014 0910   CREATININE 0.97 02/17/2018 1040   CREATININE 1.00 06/02/2014 0910      Component Value Date/Time   CALCIUM 8.8 (L) 02/17/2018 1040   CALCIUM 8.6 (L) 06/02/2014 0910   ALKPHOS 63 02/17/2018 1040   ALKPHOS 46 06/02/2014 0910   AST 21 02/17/2018 1040   AST 23 06/02/2014 0910   ALT 10 02/17/2018 1040   ALT 10 (L) 06/02/2014 0910   BILITOT 0.8 02/17/2018 1040   BILITOT 0.5 06/02/2014 0910       RADIOGRAPHIC STUDIES: I have personally reviewed the radiological images as listed and agreed with the findings in the report. No results found.   ASSESSMENT & PLAN:  Malignant neoplasm of right fallopian tube [facility]) # Metastatic fallopian tube/platinum sensitive high-grade serous-cancer; with right inguinal lymph node metastases-status post radiation to right inguinal finished middle of June 2019. SEP 2019- CT- mixed response with improvement of right inguinal lymph nodes post radiation; however increasing right pelvic adenopathy; and increasing left inguinal adenopathy [approximately 58m].   #Given worsening right lower extremity swelling- [see discussion below]-I am concerned about clinical progression.   #I had a long discussion the patient and family that given her multiple comorbidities/declining performance status-patient at high risk for potential side effects from chemotherapy.  Also discussed that benefits of the treatment are outweighed by the risk of treatment.  Treatments might lead to significant decline in the quality of life.  #Worsening shortness of breath on exertion-recommend chest x-ray adding BNP.  Reluctant with Lasix..Marland Kitchen #Right lower extremity lymphedema-second malignancy status post radiation-worsening.  Recommend evaluation with lymphedema clinic.  #Acute right  lower lung PE on Eliquis; stable.  #TIA-at least x2; on Eliquis stable.  #A long discussion with the patient and family regarding goals of care.  Discussed regarding DNR/DNI.  Also discussed regarding palliative care evaluation.  Also discussed that if patient continues to decline-hospice might be reasonable choice; however patient is not not hospice ready yet. No decisions made at this time.  # 40 minutes face-to-face with the patient discussing the above plan of care; more than 50% of time spent on prognosis/ natural history; counseling and coordination.  #DISPOSITION: # CXR today; add BNP # palliative care referral # Lymphedema referral.  # Follow-up in 1 month-MD-cbc/cmp/ca-125/ [place]  Cc; Dr.Thies.    Orders Placed This Encounter  Procedures  . DG Chest 2 View    Standing Status:   Future    Number of Occurrences:   1    Standing Expiration Date:   04/19/2019    Order Specific Question:   Reason for Exam (SYMPTOM  OR DIAGNOSIS REQUIRED)    Answer:   dyspnea/ ovarian cancer    Order Specific Question:   [facility]    [facility]    Standing Status:   Future    Number of Occurrences:   1    Standing Expiration Date:   02/18/2019  . CBC with Differential/Platelet    Standing Status:   Future    Standing Expiration Date:   02/18/2019  . Comprehensive metabolic panel    Standing Status:   Future    Standing Expiration Date:   02/18/2019  . CA 125    Standing Status:   Future    Standing Expiration Date:   02/17/2019   All questions were answered. The patient knows to call the clinic with  any problems, questions or concerns.      [name], MD 02/17/2018 12:52 PM

## 2018-02-17 NOTE — Assessment & Plan Note (Addendum)
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 SEP 2019- CT- mixed response with improvement of right inguinal lymph nodes post radiation; however increasing right pelvic adenopathy; and increasing left inguinal adenopathy [approximately 44mm].   #Given worsening right lower extremity swelling- [see discussion below]-I am concerned about clinical progression.   #I had a long discussion the patient and family that given her multiple comorbidities/declining performance status-patient at high risk for potential side effects from chemotherapy.  Also discussed that benefits of the treatment are outweighed by the risk of treatment.  Treatments might lead to significant decline in the quality of life.  #Worsening shortness of breath on exertion-recommend chest x-ray adding BNP.  Reluctant with Lasix.Marland Kitchen  #Right lower extremity lymphedema-second malignancy status post radiation-worsening.  Recommend evaluation with lymphedema clinic.  #Acute right lower lung PE on Eliquis; stable.  #TIA-at least x2; on Eliquis stable.  #A long discussion with the patient and family regarding goals of care.  Discussed regarding DNR/DNI.  Also discussed regarding palliative care evaluation.  Also discussed that if patient continues to decline-hospice might be reasonable choice; however patient is not not hospice ready yet. No decisions made at this time.  # 40 minutes face-to-face with the patient discussing the above plan of care; more than 50% of time spent on prognosis/ natural history; counseling and coordination.  #DISPOSITION: # CXR today; add BNP # palliative care referral # Lymphedema referral.  # Follow-up in 1 month-MD-cbc/cmp/ca-125/ [place]  Cc; Dr.Thies.

## 2018-02-17 NOTE — Progress Notes (Signed)
Patient presents to clinic today with c/o worsening lymhedema in right leg. Patient is now w/c bound-50 to 75% of the time and unable to ambulate due to lymphedema. (limited mobility). She is unable to stand today In clinic for her weight. Patient states that she had to cnl her last lymphedema clinic apt due to her husband death in 02/10/23. Has not been able to reschedule. Daughter in law states that are also financial concerns relating to the lymphedema therapy as insurance was not paying for this therapy. Pt states that her "lymph nodes in the pelvis and groin feel like they are enlarging." patient also reports being short of breath at rest. sats today are at 98% RA. No fevers or cough. Patient has had decline in overall perform status, I discussed with the daughter in law and patient the risk factors of developing infections in the long due to the mobility.  Discussed goals of care with the patient and her daughter in law.  Patient stated that she felt like her cancer was growing. "at this time, I don't know if I really want any more chemotherapy." She reports that there is some mild discomfort in the groin intermittently. She is not in any acute pain at this time; however, she "feels that the lymph node swelling contributes to the discomfort" She is not taking anything for pain when she is in 'discomfort.' pt states that she "doesn't want to be on narcotics unless absolutely necessary." pt states that there "main concern today is evaluating the shortness of breath rather than the lymphedema."  [facility] for [facility]. I discussed the possibility of Dr. Rogue Bussing of initiating a palliative care referral in her assisted living. Patient stated that she "not ready for hospice services at this time." Also discussed with md about reinitiated the lymphedema referral to see if this relieves the patient's symptoms (if patient/family are agreeable). [facility] does not offer lymphedema  therapy in their facility.

## 2018-02-18 LAB — CA 125: CANCER ANTIGEN (CA) 125: 71.8 U/mL — AB (ref 0.0–38.1)

## 2018-02-25 ENCOUNTER — Other Ambulatory Visit: Payer: Self-pay | Admitting: *Deleted

## 2018-02-25 ENCOUNTER — Telehealth: Payer: Self-pay | Admitting: *Deleted

## 2018-02-25 DIAGNOSIS — I890 Lymphedema, not elsewhere classified: Secondary | ICD-10-CM

## 2018-02-25 NOTE — Telephone Encounter (Signed)
Spoke with daughter regarding test results. Also reviewed the goals of care: palliative care referral and lymphedema therapy apts given decreased performance status. Daughter states that the palliative care referral was initiated and the team contacted her regarding this referral.The is expecting a call back from the palliative care team regarding the timing of the appointments at [facility] (so that family can be present for the discussion). She will contact the lymphedema clinic to reschedule the appointments. Pt was previously unable to keep apts at the lymphedema clinic due to the death of her husband. Discussed with daughter that MD had a long discussion with patient regarding the option of hospice and the goals of care. She states that she has also talked with her mother about the option of hospice, but "mom does not believe she is hospice appropriate or ready for hospice at this time."  Daughter thanked me for my time and discussion.

## 2018-02-25 NOTE — Telephone Encounter (Signed)
Nicole Clayton called reporting that she has not heard from therapy nurse regarding an appointment . She also wants results of CXR.   CLINICAL DATA:  Shortness of breath with exertion, slightly productive cough for 3 weeks, recent death of husband in mid February 11, 2023, anxiety, history of fallopian tube cancer  EXAM: CHEST - 2 VIEW  COMPARISON:  04/15/2017  Correlation: CT chest 06/20/2017  FINDINGS: RIGHT jugular Port-A-Cath with tip projecting over cavoatrial junction.  Atherosclerotic calcifications of the tortuous thoracic aorta.  Large hiatal hernia.  Otherwise normal heart size, mediastinal contours, and pulmonary vascularity.  Emphysematous changes without infiltrate, pleural effusion or pneumothorax.  Marked diffuse osseous demineralization.  IMPRESSION: Large hiatal hernia.  COPD changes without acute infiltrate.   Electronically Signed   By: Lavonia Dana M.D.   On: 02/17/2018 12:59

## 2018-02-26 ENCOUNTER — Telehealth: Payer: Self-pay | Admitting: Student

## 2018-02-26 ENCOUNTER — Non-Acute Institutional Stay: Payer: Medicare HMO | Admitting: Student

## 2018-02-26 ENCOUNTER — Telehealth: Payer: Self-pay | Admitting: *Deleted

## 2018-02-26 VITALS — BP 120/72 | HR 92 | Resp 20

## 2018-02-26 DIAGNOSIS — Z515 Encounter for palliative care: Secondary | ICD-10-CM | POA: Diagnosis not present

## 2018-02-26 NOTE — Telephone Encounter (Signed)
Phineas Real, NP with Palliative Care called requesting an order for Albuterol Inhaler for patient use at [facility]. Her call back is [phone]. Please advise

## 2018-02-26 NOTE — Telephone Encounter (Signed)
Message left at [facility] regarding request for albuterol inhaler prn for dyspnea. Awaiting return call.

## 2018-02-26 NOTE — Progress Notes (Signed)
[facility] Telephone: [phone] Fax: [phone]  PATIENT NAME: Nicole Clayton DOB: 06/24/1928 MRN: [id number]  PRIMARY CARE PROVIDER:   Ezequiel Kayser, MD  REFERRING PROVIDER: Dr. Rogue Bussing  RESPONSIBLE PARTY:  Daughter, Nicole Clayton   ASSESSMENT: Nicole Clayton is sitting in recliner upon arrival. She has pleasant affect. No acute distress noted. Discussed role of Palliative Medicine. We discussed goals of care, disease progression and symptom management. Discussed equipment needs; family denies any needs at present time. NP was met by son Nicole Clayton prior to seeing patient. He request that although patient may be eligible for Hospice, family did not want this discussed with patient at this time as she was not ready for this conversation yet.     RECOMMENDATIONS and PLAN:  1. Code status: DNR. 2. Medical goals of therapy: patient would like to follow up with Dr. Tish Men on 03/18/2018; daughter is following up on referral regarding her worsening lymphedema. Will monitor for further declines and discuss Hospice as patient does appear to be appropriate due to worsening symptoms and she is not receiving any aggressive treatments. 3. Symptom management: dyspnea-recommendation for albuterol inhaler 2 puffs every six hour prn shortness of breath or wheezing; [place] notified wit request. Pain-acetaminophen 667m BID PRN; she is encouraged to let Palliative know if pain is worsening, so further interventions can be put in place. Nausea-continue ondansetron 471mevery 8 hours PRN.  4. Emotional/spiritual support: Discussed with [name] JoWille Glasernd daughter Nicole Bootyfacility nurse TiOtila KluverThey are encouraged to call with questions.   Palliative Care to continue to follow for emotional/spiritual support, ongoing discussions trajectory of chronic disease progression, medical goals of therapy, monitor for symptoms with management, and reduce ED and hospitalizations with recommendations.  I spent  60 minutes providing this consultation,  from 10:00am to 11:00am. More than 50% of the time in this consultation was spent coordinating communication.   HISTORY OF PRESENT ILLNESS:  Nicole Clayton a [age].o.  female with multiple medical problems including malignant neoplasm of right fallopian tube, ovarian cancer, mitral valve prolapse, overactive bladder, tachycardia, history of pulmonary embolism. Daughter Nicole Bootytates patient has a new diagnosis of COPD. [facility] Mrs. HaYglesiastates that she has discussed her cancer with Dr. BrRogue Bussingnd the plan is to focus on quality of life due to risks associated with treatment. She denies pain at present; she does report occasional pain, but states "it's bearable, I just deal with it." She denies need for further intervention at this time. She does report dyspnea with exertion; currently on room air. Her family states that she has been sleeping in a recliner for several years now. She denies wheezing or hemoptysis. She reports occasional nausea; has prn zofran. She uses rollator walker for ambulation. Son reports her moving much slower and she fatigues very easily. She reports sleeping well most nights. She has lymphedema to right lower extremity; wears support stocking, which is placed on by facility staff. She states otherwise, she performs her adls. She goes to dining room for meals; she reports a good appetite and denies any weight loss. Palliative Care was asked to help address goals of care, disease progression and address symptom management.   CODE STATUS: DNR  PPS: 60% HOSPICE ELIGIBILITY/DIAGNOSIS: TBD  PAST MEDICAL HISTORY:  Past Medical History:  Diagnosis Date  . Bladder infection, chronic 10/19/2011  . Cancer (HCC)    Ovarian   .  Carcinoma of fallopian tube ([place]) 10/05/2009   Overview:  Overview:  Overview:  Drs. Claiborne Rigg and Delorise Shiner Choksi Overview:  Drs. Claiborne Rigg and [employer]   . Concussion   . GERD (gastroesophageal reflux disease)   . MI (mitral incompetence) 10/27/2014   Overview:  MODERATE   . Mitral valve prolapse   . OAB (overactive bladder)   . Ovarian cancer ([place])   . Pulmonary embolism ([place])   . Tachycardia     SOCIAL HX:  Social History   Tobacco Use  . Smoking status: Never Smoker  . Smokeless tobacco: Never Used  Substance Use Topics  . Alcohol use: No    ALLERGIES:  Allergies  Allergen Reactions  . Benadryl [Diphenhydramine] Shortness Of Breath    Sob, rash, swelling  . Tamsulosin Swelling  . Alendronate Sodium     Other reaction(s): Other (See Comments) Dysphagia  . Duloxetine Diarrhea  . Duloxetine Hcl Diarrhea  . Risedronate Sodium Rash    Aching, dysphagia  . Lovastatin     Other reaction(s): Other (See Comments) GI upset  . Sulfa Antibiotics Rash    States she can take but leave as allergy     PERTINENT MEDICATIONS:  Outpatient Encounter Medications as of 02/26/2018  Medication Sig  . acetaminophen (TYLENOL) 650 MG CR tablet Take 650 mg by mouth 2 (two) times daily as needed for pain.   Marland Kitchen apixaban (ELIQUIS) 5 MG TABS tablet Take 1 tablet (5 mg total) by mouth 2 (two) times daily.  . calcium carbonate (TUMS - DOSED IN MG ELEMENTAL CALCIUM) 500 MG chewable tablet Chew 2 tablets by mouth every 2 (two) hours as needed for indigestion or heartburn.  . cetirizine (ZYRTEC) 10 MG tablet Take 10 mg by mouth daily.   . cholecalciferol (VITAMIN D) 1000 units tablet Take 1,000 Units by mouth daily.   Marland Kitchen docusate sodium (COLACE) 100 MG capsule Take 100 mg by mouth daily as needed for mild constipation.   . fluticasone (FLONASE) 50 MCG/ACT nasal spray Place 2 sprays into both nostrils daily.   Marland Kitchen gabapentin (NEURONTIN) 600 MG tablet Take 600 mg by mouth every morning.  . loperamide (IMODIUM) 2 MG capsule Take 2 mg by mouth as needed for diarrhea or loose stools. Do not exceed 4 capsules in a 24 hour period.  .  metoprolol succinate (TOPROL-XL) 50 MG 24 hr tablet Take 50 mg by mouth daily.   . Multiple Vitamin (MULTIVITAMIN WITH MINERALS) TABS tablet Take 1 tablet by mouth daily.  . nitrofurantoin, macrocrystal-monohydrate, (MACROBID) 100 MG capsule Take 100 mg by mouth 2 (two) times daily.  . ondansetron (ZOFRAN) 4 MG tablet Take 4 mg by mouth every 8 (eight) hours as needed for nausea or vomiting.  . pantoprazole (PROTONIX) 40 MG tablet Take 40 mg by mouth daily.   . vitamin B-12 (CYANOCOBALAMIN) 500 MCG tablet Take 500 mcg by mouth daily.  . vitamin C (ASCORBIC ACID) 500 MG tablet Take 500 mg by mouth daily.    Facility-Administered Encounter Medications as of 02/26/2018  Medication  . diphenhydrAMINE (BENADRYL) injection 50 mg  . sodium chloride 0.9 % injection 10 mL  . sodium chloride 0.9 % injection 10 mL    PHYSICAL EXAM:   General: NAD, frail appearing Cardiovascular: regular rate and rhythm Pulmonary: clear ant fields Abdomen: soft, nontender, + bowel sounds Extremities: 3+ edema to right lower extremity, no joint deformities Skin: no rashes Neurological: Weakness but otherwise nonfocal  Ezekiel Slocumb, NP

## 2018-02-26 NOTE — Telephone Encounter (Signed)
Nicole Clayton- I am fine with abuterol inhaler 1-2 puffs every 6-8 hours. GB

## 2018-02-27 ENCOUNTER — Other Ambulatory Visit: Payer: Self-pay | Admitting: *Deleted

## 2018-02-27 MED ORDER — ALBUTEROL SULFATE HFA 108 (90 BASE) MCG/ACT IN AERS: 2.0000 | INHALATION_SPRAY | Freq: Four times a day (QID) | RESPIRATORY_TRACT | 2 refills | Status: DC | PRN

## 2018-02-27 MED ORDER — ALBUTEROL SULFATE HFA 108 (90 BASE) MCG/ACT IN AERS: 2.0000 | INHALATION_SPRAY | Freq: Four times a day (QID) | RESPIRATORY_TRACT | 2 refills | Status: AC | PRN

## 2018-02-27 NOTE — Telephone Encounter (Signed)
Script signed by Rulon Abide, NP and faxed to facility

## 2018-02-27 NOTE — Telephone Encounter (Signed)
Script written - pending md signature

## 2018-02-27 NOTE — Telephone Encounter (Signed)
An order needs to be faxed to [facility] per [place]. Please print and fax prescription

## 2018-02-27 NOTE — Telephone Encounter (Signed)
Send Nicole Clayton [phone]

## 2018-02-27 NOTE — Telephone Encounter (Signed)
Do you have the fax # Hassan Rowan?

## 2018-03-03 ENCOUNTER — Ambulatory Visit (INDEPENDENT_AMBULATORY_CARE_PROVIDER_SITE_OTHER)
Admit: 2018-03-03 | Discharge: 2018-03-03 | Disposition: A | Discharge status: Home / Self Care | Payer: Medicare HMO | Attending: Family Medicine | Admitting: Family Medicine

## 2018-03-03 ENCOUNTER — Other Ambulatory Visit: Payer: Self-pay

## 2018-03-03 ENCOUNTER — Ambulatory Visit
Admission: EM | Admit: 2018-03-03 | Discharge: 2018-03-03 | Disposition: A | Discharge status: Home / Self Care | Payer: Medicare HMO | Attending: Family Medicine | Admitting: Family Medicine

## 2018-03-03 DIAGNOSIS — S0990XA Unspecified injury of head, initial encounter: Secondary | ICD-10-CM | POA: Insufficient documentation

## 2018-03-03 DIAGNOSIS — W07XXXA Fall from chair, initial encounter: Secondary | ICD-10-CM | POA: Diagnosis not present

## 2018-03-03 DIAGNOSIS — S060X0A Concussion without loss of consciousness, initial encounter: Secondary | ICD-10-CM

## 2018-03-03 NOTE — Discharge Instructions (Signed)
CT negative.  Close observation.   Tylenol as needed for headache.  Take care  Dr. Lacinda Axon

## 2018-03-03 NOTE — ED Provider Notes (Signed)
[facility]    CSN: [id number] Arrival date & time: 03/03/18  1155 History   Chief Complaint Chief Complaint  Patient presents with  . Fall  . Headache   HPI  85 year old female with an extensive past medical history including chronic anticoagulation presents for evaluation after suffering a fall.  Patient states that she fell yesterday morning.  She fell attempting to get out of her chair.  She is not exactly sure what happened.  She believes that she fell and hit her head on the floor.  Patient elected to not be evaluated following her injury.  Per the son, she was more fatigued yesterday.  She essentially slept all day and all night.  Son also states that she seemed to be "loopy".  He states that she seems to be taking a long time to answer questions.  She does answer them appropriately.  Patient continues to have headache.  She states that she has had some sharp pains shooting throughout her head.  Patient has a small hematoma on the right frontal region with associated bruising.  There are no reports of loss of consciousness.  No reported weakness.  No other reported symptoms.  No other complaints at this time.  PMH, Surgical Hx, Family Hx, Social History reviewed and updated as below.  Past Medical History:  Diagnosis Date  . Bladder infection, chronic 10/19/2011  . Cancer (HCC)    Ovarian   . Carcinoma of fallopian tube ([place]) 10/05/2009   Overview:  Overview:  Overview:  Drs. Claiborne Rigg and Delorise Shiner Choksi Overview:  Drs. Claiborne Rigg and [employer]   . Concussion   . GERD (gastroesophageal reflux disease)   . MI (mitral incompetence) 10/27/2014   Overview:  MODERATE   . Mitral valve prolapse   . OAB (overactive bladder)   . Ovarian cancer ([place])   . Pulmonary embolism ([place])   . Tachycardia     Patient Active Problem List   Diagnosis Date Noted  . Weakness 10/21/2017  . Pain and swelling of right lower leg 10/11/2016  . Anemia 09/11/2015  . UTI (urinary  tract infection) due to Enterococcus 07/19/2015  . 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 Neuropathy 02/10/2015  . Midline cystocele 12/15/2014  . Vaginal atrophy 12/15/2014  . Status post hysterectomy with oophorectomy 12/15/2014  . Celiac disease 11/07/2014  . MI (mitral incompetence) 10/27/2014  . Infection of urinary tract 08/12/2014  . CD (celiac disease) 07/12/2014  . Mixed hyperlipidemia 04/12/2014  . Paroxysmal supraventricular tachycardia ([place]) 04/12/2014  . Thrombocythemia ([place]) 09/26/2013  . Vitamin D deficiency 09/26/2013  . Frequent UTI 09/26/2013  . Chest pain 07/28/2013  . Edema 07/06/2013  . Other symptoms involving urinary system 07/07/2012  . Female genuine stress incontinence 02/10/2012  . Bladder infection, chronic 10/19/2011  . Urge incontinence 10/19/2011  . Malignant neoplasm of right fallopian tube ([place]) 10/05/2009    Past Surgical History:  Procedure Laterality Date  . ABDOMINAL HYSTERECTOMY    . CHOLECYSTECTOMY    . LAPAROSCOPIC BILATERAL SALPINGO OOPHERECTOMY  2011  . ovarian cancer surgery  2011    OB History    Gravida  3   Para  3   Term  3   Preterm      AB      Living  3     SAB      TAB      Ectopic      Multiple      Live Births  3  Home Medications    Prior to Admission medications   Medication Sig Start Date End Date Taking? Authorizing Provider  acetaminophen (TYLENOL) 650 MG CR tablet Take 650 mg by mouth 2 (two) times daily as needed for pain.    Yes [provider]  albuterol (PROVENTIL HFA;VENTOLIN HFA) 108 (90 Base) MCG/ACT inhaler Inhale 2 puffs into the lungs every 6 (six) hours as needed for wheezing or shortness of breath. 02/27/18  Yes Burns, Wandra Feinstein, NP  apixaban (ELIQUIS) 5 MG TABS tablet Take 1 tablet (5 mg total) by mouth 2 (two) times daily. 01/17/17  Yes [name], MD  calcium carbonate (TUMS - DOSED IN MG ELEMENTAL CALCIUM) 500 MG chewable tablet Chew 2 tablets by mouth  every 2 (two) hours as needed for indigestion or heartburn.   Yes [provider]  cetirizine (ZYRTEC) 10 MG tablet Take 10 mg by mouth daily.    Yes [provider]  cholecalciferol (VITAMIN D) 1000 units tablet Take 1,000 Units by mouth daily.    Yes [provider]  docusate sodium (COLACE) 100 MG capsule Take 100 mg by mouth daily as needed for mild constipation.    Yes [provider]  fluticasone (FLONASE) 50 MCG/ACT nasal spray Place 2 sprays into both nostrils daily.    Yes [provider]  gabapentin (NEURONTIN) 600 MG tablet Take 600 mg by mouth every morning.   Yes [provider]  loperamide (IMODIUM) 2 MG capsule Take 2 mg by mouth as needed for diarrhea or loose stools. Do not exceed 4 capsules in a 24 hour period.   Yes [provider]  metoprolol succinate (TOPROL-XL) 50 MG 24 hr tablet Take 50 mg by mouth daily.  05/17/17  Yes [provider]  Multiple Vitamin (MULTIVITAMIN WITH MINERALS) TABS tablet Take 1 tablet by mouth daily.   Yes [provider]  nitrofurantoin, macrocrystal-monohydrate, (MACROBID) 100 MG capsule Take 100 mg by mouth 2 (two) times daily.   Yes [provider]  ondansetron (ZOFRAN) 4 MG tablet Take 4 mg by mouth every 8 (eight) hours as needed for nausea or vomiting.   Yes [provider]  pantoprazole (PROTONIX) 40 MG tablet Take 40 mg by mouth daily.  10/18/11  Yes [provider]  vitamin B-12 (CYANOCOBALAMIN) 500 MCG tablet Take 500 mcg by mouth daily.   Yes [provider]  vitamin C (ASCORBIC ACID) 500 MG tablet Take 500 mg by mouth daily.    Yes [provider]    Family History Family History  Problem Relation Age of Onset  . Ovarian cancer Other   . Breast cancer Neg Hx   . Colon cancer Neg Hx   . Diabetes Neg Hx   . Heart disease Neg Hx   . Kidney disease Neg Hx   . Bladder Cancer Neg Hx     Social History Social  History   Tobacco Use  . Smoking status: Never Smoker  . Smokeless tobacco: Never Used  Substance Use Topics  . Alcohol use: No  . Drug use: No     Allergies   Benadryl [diphenhydramine]; Tamsulosin; Alendronate sodium; Duloxetine; Duloxetine hcl; Risedronate sodium; Lovastatin; and Sulfa antibiotics   Review of Systems Review of Systems  HENT:       Head injury  Neurological:       Fall, headache.   Physical Exam Triage Vital Signs ED Triage Vitals  Enc Vitals Group     BP 03/03/18 1209  128/71     Pulse Rate 03/03/18 1209 66     Resp 03/03/18 1209 16     Temp 03/03/18 1209 97.9 F (36.6 C)     Temp Source 03/03/18 1209 Oral     SpO2 03/03/18 1209 95 %     Weight 03/03/18 1209 140 lb (63.5 kg)     Height 03/03/18 1209 5' (1.524 m)     Head Circumference --      Peak Flow --      Pain Score 03/03/18 1208 0     Pain Loc --      Pain Edu? --      Excl. in [place]? --    Updated Vital Signs BP 128/71 (BP Location: Left Arm)   Pulse 66   Temp 97.9 F (36.6 C) (Oral)   Resp 16   Ht 5' (1.524 m)   Wt 63.5 kg   SpO2 95%   BMI 27.34 kg/m   Visual Acuity Right Eye Distance:   Left Eye Distance:   Bilateral Distance:    Right Eye Near:   Left Eye Near:    Bilateral Near:     Physical Exam Vitals signs and nursing note reviewed.  Constitutional:      General: She is not in acute distress.    Appearance: She is not ill-appearing.  HENT:     Head:     Comments: Patient has mild swelling in the right frontal region and around the right eye.  Bruising noted.    Right Ear: Tympanic membrane normal.     Left Ear: Tympanic membrane normal.     Nose: Nose normal.     Right Nostril: No epistaxis.     Left Nostril: No epistaxis.     Mouth/Throat:     Pharynx: Oropharynx is clear. No posterior oropharyngeal erythema.  Eyes:     Conjunctiva/sclera: Conjunctivae normal.     Pupils: Pupils are equal, round, and reactive to light.  Cardiovascular:     Rate and  Rhythm: Normal rate and regular rhythm.  Pulmonary:     Effort: Pulmonary effort is normal.     Breath sounds: Normal breath sounds. No wheezing, rhonchi or rales.  Neurological:     Mental Status: She is alert.     Comments: Alert and oriented x3.  Answers questions appropriately.  Psychiatric:        Mood and Affect: Mood normal.        Behavior: Behavior normal.    UC Treatments / Results  Labs (all labs ordered are listed, but only abnormal results are displayed) Labs Reviewed - No data to display  EKG None  Radiology Ct Head Wo Contrast  Result Date: 03/03/2018 CLINICAL DATA:  Recent fall yesterday with blood thinner use EXAM: CT HEAD WITHOUT CONTRAST TECHNIQUE: Contiguous axial images were obtained from the base of the skull through the vertex without intravenous contrast. COMPARISON:  10/22/2017 FINDINGS: Brain: Chronic atrophic and ischemic changes are noted. No findings to suggest acute hemorrhage, acute infarction or space-occupying mass lesion are noted. Vascular: No hyperdense vessel or unexpected calcification. Skull: Normal. Negative for fracture or focal lesion. Sinuses/Orbits: No acute finding. Other: None. IMPRESSION: Chronic atrophic and ischemic changes without acute intracranial abnormality. Electronically Signed   By: Inez Catalina M.D.   On: 03/03/2018 13:17    Procedures Procedures (including critical care time)  Medications Ordered in UC Medications - No data to display  Initial Impression / Assessment and Plan / UC  Course  I have reviewed the triage vital signs and the nursing notes.  Pertinent labs & imaging results that were available during my care of the patient were reviewed by me and considered in my medical decision making (see chart for details).    85 year old female presents for evaluation after suffering a fall and hitting her head.  CT was done here and was negative.  Advised close observation.  Tylenol as needed.  Supportive care.  Final  Clinical Impressions(s) / UC Diagnoses   Final diagnoses:  Minor head injury, initial encounter  Concussion without loss of consciousness, initial encounter     Discharge Instructions     CT negative.  Close observation.   Tylenol as needed for headache.  Take care  Dr. Lacinda Axon     ED Prescriptions    None     Controlled Substance Prescriptions [place] Controlled Substance Registry consulted? Not Applicable   Coral Spikes, DO 03/03/18 1334

## 2018-03-03 NOTE — ED Triage Notes (Addendum)
Patient fell yesterday while in her assisted living- [facility]. Patient states that she tried to get out of a chair without calling for assistance. States that she fell and hit her head on the floor. Patient son states that she was very loopy yesterday and slept a good portion of the day. Patient son states that she seems to still be a little out of it today. Patient is currently on blood thinners. Patient states that she doesn't feel like she is all there and has noticed pain shooting through her head.

## 2018-03-05 ENCOUNTER — Telehealth: Payer: Self-pay

## 2018-03-05 ENCOUNTER — Ambulatory Visit: Payer: Medicare HMO | Admitting: Occupational Therapy

## 2018-03-05 DIAGNOSIS — J439 Emphysema, unspecified: Secondary | ICD-10-CM | POA: Diagnosis not present

## 2018-03-05 DIAGNOSIS — K029 Dental caries, unspecified: Secondary | ICD-10-CM | POA: Diagnosis not present

## 2018-03-05 DIAGNOSIS — S060X0D Concussion without loss of consciousness, subsequent encounter: Secondary | ICD-10-CM | POA: Diagnosis not present

## 2018-03-05 DIAGNOSIS — R05 Cough: Secondary | ICD-10-CM | POA: Diagnosis not present

## 2018-03-05 NOTE — Telephone Encounter (Signed)
I received a voicemail from [place] who states that patient is scheduled for a tooth extraction on 03/11/17 with Dr. Landry Mellow at [facility] in [place]. [name] states that the patient just needs to verify with Dr. B that this is okay before proceeding.  Dr. Jacinto Reap, please advise. Thanks!

## 2018-03-06 ENCOUNTER — Telehealth: Payer: Self-pay | Admitting: Internal Medicine

## 2018-03-06 NOTE — Telephone Encounter (Signed)
Brooke- please inform [name] dentist office/ that the Eliquis has to be held days prior toe extraction; and re-started 2 days later. Or else they have the office call me directly.  Thanks GB

## 2018-03-06 NOTE — Telephone Encounter (Signed)
I left message for dentist office to return phone call in regards to patient and upcoming procedure. I also contacted patient's daughter [name] and informed her of Dr. Sharmaine Base recommendations.

## 2018-03-06 NOTE — Telephone Encounter (Signed)
Hold Eliuiqs 2 days prior to extraction; and re-start 2 days after extraction.   Please inform dentist office.   Thanks GB

## 2018-03-09 NOTE — Telephone Encounter (Signed)
Paige from [facility] returned phone call this AM and I advised them of Dr. Sharmaine Base recommendations, and that patient has been notified of these recommendations as well. Thanks!

## 2018-03-12 ENCOUNTER — Other Ambulatory Visit: Payer: Self-pay | Admitting: Nurse Practitioner

## 2018-03-17 ENCOUNTER — Ambulatory Visit: Payer: Medicare HMO | Admitting: Internal Medicine

## 2018-03-17 ENCOUNTER — Other Ambulatory Visit: Payer: Medicare HMO

## 2018-03-18 ENCOUNTER — Ambulatory Visit
Admission: RE | Admit: 2018-03-18 | Discharge: 2018-03-18 | Disposition: A | Discharge status: Home / Self Care | Payer: Medicare HMO | Attending: Internal Medicine | Admitting: Internal Medicine

## 2018-03-18 ENCOUNTER — Telehealth: Payer: Self-pay | Admitting: *Deleted

## 2018-03-18 ENCOUNTER — Inpatient Hospital Stay: Payer: Medicare HMO | Attending: Internal Medicine

## 2018-03-18 ENCOUNTER — Other Ambulatory Visit: Payer: Self-pay

## 2018-03-18 ENCOUNTER — Ambulatory Visit
Admission: RE | Admit: 2018-03-18 | Discharge: 2018-03-18 | Disposition: A | Discharge status: Home / Self Care | Payer: Medicare HMO | Source: Ambulatory Visit | Attending: Internal Medicine | Admitting: Internal Medicine

## 2018-03-18 ENCOUNTER — Inpatient Hospital Stay (HOSPITAL_BASED_OUTPATIENT_CLINIC_OR_DEPARTMENT_OTHER): Payer: Medicare HMO | Admitting: Internal Medicine

## 2018-03-18 VITALS — BP 127/75 | HR 67 | Temp 97.9°F | Resp 20 | Ht 60.0 in | Wt 137.0 lb

## 2018-03-18 DIAGNOSIS — C5701 Malignant neoplasm of right fallopian tube: Secondary | ICD-10-CM | POA: Diagnosis not present

## 2018-03-18 DIAGNOSIS — I2699 Other pulmonary embolism without acute cor pulmonale: Secondary | ICD-10-CM

## 2018-03-18 DIAGNOSIS — R296 Repeated falls: Secondary | ICD-10-CM

## 2018-03-18 DIAGNOSIS — R059 Cough, unspecified: Secondary | ICD-10-CM

## 2018-03-18 DIAGNOSIS — Z8673 Personal history of transient ischemic attack (TIA), and cerebral infarction without residual deficits: Secondary | ICD-10-CM | POA: Diagnosis not present

## 2018-03-18 DIAGNOSIS — Z923 Personal history of irradiation: Secondary | ICD-10-CM | POA: Insufficient documentation

## 2018-03-18 DIAGNOSIS — R609 Edema, unspecified: Secondary | ICD-10-CM | POA: Insufficient documentation

## 2018-03-18 DIAGNOSIS — R05 Cough: Secondary | ICD-10-CM

## 2018-03-18 DIAGNOSIS — Z7901 Long term (current) use of anticoagulants: Secondary | ICD-10-CM | POA: Insufficient documentation

## 2018-03-18 DIAGNOSIS — R54 Age-related physical debility: Secondary | ICD-10-CM | POA: Diagnosis not present

## 2018-03-18 DIAGNOSIS — I890 Lymphedema, not elsewhere classified: Secondary | ICD-10-CM | POA: Diagnosis not present

## 2018-03-18 DIAGNOSIS — R0602 Shortness of breath: Secondary | ICD-10-CM

## 2018-03-18 DIAGNOSIS — Z86711 Personal history of pulmonary embolism: Secondary | ICD-10-CM | POA: Insufficient documentation

## 2018-03-18 DIAGNOSIS — C774 Secondary and unspecified malignant neoplasm of inguinal and lower limb lymph nodes: Secondary | ICD-10-CM

## 2018-03-18 DIAGNOSIS — Z79899 Other long term (current) drug therapy: Secondary | ICD-10-CM | POA: Diagnosis not present

## 2018-03-18 LAB — CBC WITH DIFFERENTIAL/PLATELET
Abs Immature Granulocytes: 0.01 10*3/uL (ref 0.00–0.07)
Basophils Absolute: 0 10*3/uL (ref 0.0–0.1)
Basophils Relative: 1 %
Eosinophils Absolute: 0 10*3/uL (ref 0.0–0.5)
Eosinophils Relative: 1 %
HEMATOCRIT: 36.7 % (ref 36.0–46.0)
Hemoglobin: 11.8 g/dL — ABNORMAL LOW (ref 12.0–15.0)
Immature Granulocytes: 0 %
Lymphocytes Relative: 17 %
Lymphs Abs: 0.9 10*3/uL (ref 0.7–4.0)
MCH: 29.9 pg (ref 26.0–34.0)
MCHC: 32.2 g/dL (ref 30.0–36.0)
MCV: 93.1 fL (ref 80.0–100.0)
MONO ABS: 0.7 10*3/uL (ref 0.1–1.0)
Monocytes Relative: 13 %
Neutro Abs: 3.8 10*3/uL (ref 1.7–7.7)
Neutrophils Relative %: 68 %
Platelets: 395 10*3/uL (ref 150–400)
RBC: 3.94 MIL/uL (ref 3.87–5.11)
RDW: 14.1 % (ref 11.5–15.5)
WBC: 5.6 10*3/uL (ref 4.0–10.5)
nRBC: 0 % (ref 0.0–0.2)

## 2018-03-18 LAB — COMPREHENSIVE METABOLIC PANEL
ALK PHOS: 66 U/L (ref 38–126)
ALT: 10 U/L (ref 0–44)
AST: 19 U/L (ref 15–41)
Albumin: 3.3 g/dL — ABNORMAL LOW (ref 3.5–5.0)
Anion gap: 6 (ref 5–15)
BUN: 15 mg/dL (ref 8–23)
CALCIUM: 8.6 mg/dL — AB (ref 8.9–10.3)
CO2: 26 mmol/L (ref 22–32)
Chloride: 104 mmol/L (ref 98–111)
Creatinine, Ser: 0.83 mg/dL (ref 0.44–1.00)
GFR calc Af Amer: >60 mL/min (ref >60)
GFR calc non Af Amer: >60 mL/min (ref >60)
Glucose, Bld: 106 mg/dL — ABNORMAL HIGH (ref 70–99)
Potassium: 4.1 mmol/L (ref 3.5–5.1)
Sodium: 136 mmol/L (ref 135–145)
Total Bilirubin: 0.5 mg/dL (ref 0.3–1.2)
Total Protein: 6.9 g/dL (ref 6.5–8.1)

## 2018-03-18 MED ORDER — PREDNISONE 20 MG PO TABS: 20.0000 mg | ORAL_TABLET | Freq: Every day | ORAL | 0 refills | Status: DC

## 2018-03-18 MED ORDER — HYDROCOD POLST-CPM POLST ER 10-8 MG/5ML PO SUER: 5.0000 mL | Freq: Every evening | ORAL | 0 refills | Status: AC | PRN

## 2018-03-18 MED ORDER — FUROSEMIDE 20 MG PO TABS: 20.0000 mg | ORAL_TABLET | Freq: Every day | ORAL | 0 refills | Status: DC

## 2018-03-18 NOTE — Telephone Encounter (Signed)
Called report  IMPRESSION: 1. New small RIGHT lung base atelectasis versus pneumonia with small pleural effusion. 2. Stable cardiomegaly and chronic interstitial changes. 3. Large hiatal hernia. 4. These results will be called to the ordering clinician or representative by the professional radiologist assistant, and communication documented in [facility] Dashboard.   Electronically Signed   By: Elon Alas M.D.   On: 03/18/2018 12:48

## 2018-03-18 NOTE — Assessment & Plan Note (Addendum)
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 SEP 2019- CT- mixed response with improvement of right inguinal lymph nodes post radiation; however increasing right pelvic adenopathy; and increasing left inguinal adenopathy [approximately 63mm].   #Given worsening right lower extremity swelling- [see discussion below]-I am concerned about clinical progression.   #I had a long discussion the patient and family that given her multiple comorbidities/declining performance status-patient at high risk for potential side effects from chemotherapy.  Also discussed that benefits of the treatment are outweighed by the risk of treatment.  Treatments might lead to significant decline in the quality of life.  # Worsening shortness of breath on exertion-CXR- today; lasix 20 mg/day; prednisone 20 mg/day; tussinex   #Right lower extremity lymphedema-second malignancy status post radiation-worsening.  Continue compression stockings.  #Acute right lower lung PE on Eliquis; stable; but falling [see discussion]  #TIA-at least x2; on Eliquis stable;   #Multiple falls-while on Eliquis secondary to frailty/progressive disease.  Discussed with the patient and family the risk of bleeding/and also fatality from the bleeding.  Discussed that it is reasonable to stop Eliquis given the risks of fall and bleeding/then blood clots.  Await hospice nurse evaluation-I think is reasonable to discontinue Eliquis if patient's risk of fall continues to be high.  #DISPOSITION: note to SNF.  # CXR today;  # hospice referral # Follow-up in 1 month-MD-cbc/cmp/ca-125-Dr.B  # 40 minutes face-to-face with the patient discussing the above plan of care; more than 50% of time spent on prognosis/ natural history; counseling and coordination.   Cc; Dr.Thies.

## 2018-03-18 NOTE — Progress Notes (Signed)
[place] OFFICE PROGRESS NOTE  Patient Care Team: Ezequiel Kayser, [name]  Cancer Staging No matching staging information was found for the patient.   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  Adequate TRS, no macroscopic residual. IP/IV chemotherapy with DDP and paclitaxel on GOG protocol, chemo and Bev consolidation completed in 03/2011 2. 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 November 14, 2011- Patient was started on carboplatin and gemcitabine 4. Finished 6 cycles of chemotherapy in April of 2014 with carboplatin and gemcitabine. Tolerance was fairly good except for neutropenia and thrombocytopenia. 5.recurrent disease by CT scan February of 2015  # .radiation therapy to pelvis  May of 2015  7.upper extremity  . and deep vein thrombosis associated with port.(June of 2015) patient started on   [place] had port removed and had   thrombectomy September 06, 2013. # CT DEC 2016- progressing disease in the right inguinal area # FEB-TAXOL-AVASTIN;  # FEB 2017- AVASTIN q 3W; July 2017- CT- Improved  Right Inguinal LN; STOP AVASTIN sec to potential concerns of AEs  # AUG 2017 3rd- START ZEJULA- declines sec to intol  # OCT 7th CT- STABLE RIGHT INGUINAL LN- RE-START AVASTIN q 3W [STOPPED oct 25th 2018]; Avastin discontinued October 2018 [stroke/PE]  # MAY 2019- Right inguinal adenopathy progression status post radiation [June 2019]   #TIA x2 [name] 2019 last]; PE [[facility]]; right lower extremity lymphedema-radiation/malignancy  ------------------------------------------------ MOLECULAR TESTING: somatic BRCA negative; F-ONE: No targets*   Dx: SEP 2013  fallopian tube/platinum sensitive high-grade serous-cancer  Current/most recent therapy-surveillance.  Goals: palliative; stage: Metastatic/Recurrent - IV          Malignant neoplasm of right fallopian tube [facility])      INTERVAL HISTORY:  Nicole Clayton 85 y.o.  female pleasant patient above history of metastatic fallopian tube high-grade serous most recently status post radiation to her right inguinal region is here for follow-up.  Patient continues to complain of cough.  This has not gotten any better.  She is also recently evaluated by PCP.  Cough is usually dry.  No hemoptysis.  Shortness of breath and exertion.  Poor appetite.  Losing weight.  She continues to have swelling in the legs-has not had yet lymphedema therapy.  Unfortunately she has fallen about 3 times in the last month or so.  Recent CT scan of the head was negative for any brain bleed.  In the interim she has met with palliative care.  No additional major.  Review of Systems  Constitutional: Positive for malaise/fatigue. Negative for chills, diaphoresis, fever and weight loss.  HENT: Negative for nosebleeds and sore throat.   Eyes: Negative for double vision.  Respiratory: Positive for cough and shortness of breath. Negative for hemoptysis and wheezing.   Cardiovascular: Positive for leg swelling. Negative for chest pain, palpitations and orthopnea.  Gastrointestinal: Negative for abdominal pain, blood in stool, constipation, diarrhea, heartburn, melena, nausea and vomiting.  Genitourinary: Negative for dysuria, frequency and urgency.  Musculoskeletal: Negative for back pain and joint pain.  Skin: Negative.  Negative for itching and rash.  Neurological: Negative for dizziness, tingling, focal weakness, weakness and headaches.  Endo/Heme/Allergies: Does not bruise/bleed easily.  Psychiatric/Behavioral: Negative for depression. The patient is not nervous/anxious and does not have insomnia.       PAST MEDICAL HISTORY :  Past Medical History:  Diagnosis Date  .  Bladder infection, chronic 10/19/2011  . Cancer (HCC)    Ovarian   . Carcinoma of fallopian tube ([place]) 10/05/2009    Overview:  Overview:  Overview:  Drs. Claiborne Rigg and Delorise Shiner Choksi Overview:  Drs. Claiborne Rigg and [employer]   . Concussion   . GERD (gastroesophageal reflux disease)   . MI (mitral incompetence) 10/27/2014   Overview:  MODERATE   . Mitral valve prolapse   . OAB (overactive bladder)   . Ovarian cancer ([place])   . Pulmonary embolism ([place])   . Tachycardia     PAST SURGICAL HISTORY :   Past Surgical History:  Procedure Laterality Date  . ABDOMINAL HYSTERECTOMY    . CHOLECYSTECTOMY    . LAPAROSCOPIC BILATERAL SALPINGO OOPHERECTOMY  2011  . ovarian cancer surgery  2011    FAMILY HISTORY :   Family History  Problem Relation Age of Onset  . Ovarian cancer Other   . Breast cancer Neg Hx   . Colon cancer Neg Hx   . Diabetes Neg Hx   . Heart disease Neg Hx   . Kidney disease Neg Hx   . Bladder Cancer Neg Hx     SOCIAL HISTORY:   Social History   Tobacco Use  . Smoking status: Never Smoker  . Smokeless tobacco: Never Used  Substance Use Topics  . Alcohol use: No  . Drug use: No    ALLERGIES:  is allergic to benadryl [diphenhydramine]; tamsulosin; alendronate sodium; duloxetine; duloxetine hcl; risedronate sodium; lovastatin; and sulfa antibiotics.  MEDICATIONS:  Current Outpatient Medications  Medication Sig Dispense Refill  . acetaminophen (TYLENOL) 650 MG CR tablet Take 650 mg by mouth 2 (two) times daily as needed for pain.     Marland Kitchen albuterol (PROVENTIL HFA;VENTOLIN HFA) 108 (90 Base) MCG/ACT inhaler Inhale 2 puffs into the lungs every 6 (six) hours as needed for wheezing or shortness of breath. 1 Inhaler 2  . apixaban (ELIQUIS) 5 MG TABS tablet Take 1 tablet (5 mg total) by mouth 2 (two) times daily. 60 tablet 5  . benzonatate (TESSALON) 200 MG capsule Take 1 capsule by mouth 3 (three) times daily.    . calcium carbonate (TUMS - DOSED IN MG ELEMENTAL CALCIUM) 500 MG chewable tablet Chew 2 tablets by mouth every 2 (two) hours as needed for indigestion or heartburn.     . cetirizine (ZYRTEC) 10 MG tablet Take 10 mg by mouth daily.     . cholecalciferol (VITAMIN D) 1000 units tablet Take 1,000 Units by mouth daily.     Marland Kitchen docusate sodium (COLACE) 100 MG capsule Take 100 mg by mouth daily as needed for mild constipation.     . fluticasone (FLONASE) 50 MCG/ACT nasal spray Place 2 sprays into both nostrils daily.     Marland Kitchen gabapentin (NEURONTIN) 600 MG tablet Take 600 mg by mouth every morning.    . metoprolol succinate (TOPROL-XL) 50 MG 24 hr tablet Take 50 mg by mouth daily.     . Multiple Vitamin (MULTIVITAMIN WITH MINERALS) TABS tablet Take 1 tablet by mouth daily.    . nitrofurantoin, macrocrystal-monohydrate, (MACROBID) 100 MG capsule Take 100 mg by mouth 2 (two) times daily.    . ondansetron (ZOFRAN) 4 MG tablet Take 4 mg by mouth every 8 (eight) hours as needed for nausea or vomiting.    . pantoprazole (PROTONIX) 40 MG tablet Take 40 mg by mouth daily.     . vitamin B-12 (CYANOCOBALAMIN) 500 MCG tablet Take 500 mcg by mouth  daily.    . vitamin C (ASCORBIC ACID) 500 MG tablet Take 500 mg by mouth daily.     . chlorpheniramine-HYDROcodone (TUSSIONEX) 10-8 MG/5ML SUER Take 5 mLs by mouth at bedtime as needed for cough. 140 mL 0  . furosemide (LASIX) 20 MG tablet Take 1 tablet (20 mg total) by mouth daily. 7 tablet 0  . loperamide (IMODIUM) 2 MG capsule Take 2 mg by mouth as needed for diarrhea or loose stools. Do not exceed 4 capsules in a 24 hour period.    . predniSONE (DELTASONE) 20 MG tablet Take 1 tablet (20 mg total) by mouth daily with breakfast. Once a day with food x 10 days. 7 tablet 0   No current facility-administered medications for this visit.    Facility-Administered Medications Ordered in Other Visits  Medication Dose Route Frequency Provider Last Rate Last Dose  . diphenhydrAMINE (BENADRYL) injection 50 mg  50 mg Intravenous Once Choksi, Janak, MD      . sodium chloride 0.9 % injection 10 mL  10 mL Intravenous PRN Forest Gleason, MD   10 mL at  01/12/15 1116  . sodium chloride 0.9 % injection 10 mL  10 mL Intravenous PRN Forest Gleason, MD   10 mL at 02/16/15 1025    PHYSICAL EXAMINATION: ECOG PERFORMANCE STATUS: 1 - Symptomatic but completely ambulatory  BP 127/75   Pulse 67   Temp 97.9 F (36.6 C) (Tympanic)   Resp 20   Ht 5' (1.524 m)   Wt 137 lb (62.1 kg)   SpO2 96%   BMI 26.76 kg/m   Filed Weights   03/18/18 1042  Weight: 137 lb (62.1 kg)    Physical Exam  Constitutional: She is oriented to person, place, and time.  Frail-appearing Caucasian female patient.  She is in a wheelchair.  Accompanied by daughter.  HENT:  Head: Normocephalic and atraumatic.  Mouth/Throat: Oropharynx is clear and moist. No oropharyngeal exudate.  Eyes: Pupils are equal, round, and reactive to light.  Neck: Normal range of motion. Neck supple.  Cardiovascular: Normal rate and regular rhythm.  Pulmonary/Chest: No respiratory distress. She has no wheezes.  Positive for crackles bilaterally at the bases.  Decreased breath sounds.  Abdominal: Soft. Bowel sounds are normal. She exhibits no distension and no mass. There is no abdominal tenderness. There is no rebound and no guarding.  Musculoskeletal: Normal range of motion.        General: No tenderness or edema.     Comments: Bilateral lower extremity swelling right more than left.  Neurological: She is alert and oriented to person, place, and time.  Skin: Skin is warm.  Psychiatric: Affect normal.       LABORATORY DATA:  I have reviewed the data as listed    Component Value Date/Time   NA 136 03/18/2018 0959   NA 138 06/02/2014 0910   K 4.1 03/18/2018 0959   K 3.7 06/02/2014 0910   CL 104 03/18/2018 0959   CL 107 06/02/2014 0910   CO2 26 03/18/2018 0959   CO2 26 06/02/2014 0910   GLUCOSE 106 (H) 03/18/2018 0959   GLUCOSE 106 (H) 06/02/2014 0910   BUN 15 03/18/2018 0959   BUN 17 06/02/2014 0910   CREATININE 0.83 03/18/2018 0959   CREATININE 1.00 06/02/2014 0910    CALCIUM 8.6 (L) 03/18/2018 0959   CALCIUM 8.6 (L) 06/02/2014 0910   PROT 6.9 03/18/2018 0959   PROT 6.3 (L) 06/02/2014 0910   ALBUMIN 3.3 (L) 03/18/2018 0569  ALBUMIN 3.4 (L) 06/02/2014 0910   AST 19 03/18/2018 0959   AST 23 06/02/2014 0910   ALT 10 03/18/2018 0959   ALT 10 (L) 06/02/2014 0910   ALKPHOS 66 03/18/2018 0959   ALKPHOS 46 06/02/2014 0910   BILITOT 0.5 03/18/2018 0959   BILITOT 0.5 06/02/2014 0910   GFRNONAA >60 03/18/2018 0959   GFRNONAA 52 (L) 06/02/2014 0910   GFRAA >60 03/18/2018 0959   GFRAA >60 06/02/2014 0910    No results found for: SPEP, UPEP  Lab Results  Component Value Date   WBC 5.6 03/18/2018   NEUTROABS 3.8 03/18/2018   HGB 11.8 (L) 03/18/2018   HCT 36.7 03/18/2018   MCV 93.1 03/18/2018   PLT 395 03/18/2018      Chemistry      Component Value Date/Time   NA 136 03/18/2018 0959   NA 138 06/02/2014 0910   K 4.1 03/18/2018 0959   K 3.7 06/02/2014 0910   CL 104 03/18/2018 0959   CL 107 06/02/2014 0910   CO2 26 03/18/2018 0959   CO2 26 06/02/2014 0910   BUN 15 03/18/2018 0959   BUN 17 06/02/2014 0910   CREATININE 0.83 03/18/2018 0959   CREATININE 1.00 06/02/2014 0910      Component Value Date/Time   CALCIUM 8.6 (L) 03/18/2018 0959   CALCIUM 8.6 (L) 06/02/2014 0910   ALKPHOS 66 03/18/2018 0959   ALKPHOS 46 06/02/2014 0910   AST 19 03/18/2018 0959   AST 23 06/02/2014 0910   ALT 10 03/18/2018 0959   ALT 10 (L) 06/02/2014 0910   BILITOT 0.5 03/18/2018 0959   BILITOT 0.5 06/02/2014 0910       RADIOGRAPHIC STUDIES: I have personally reviewed the radiological images as listed and agreed with the findings in the report. No results found.   ASSESSMENT & PLAN:  Malignant neoplasm of right fallopian tube [facility]) # Metastatic fallopian tube/platinum sensitive high-grade serous-cancer; with right inguinal lymph node metastases-status post radiation to right inguinal finished middle of June 2019. SEP 2019- CT- mixed response with improvement  of right inguinal lymph nodes post radiation; however increasing right pelvic adenopathy; and increasing left inguinal adenopathy [approximately 52m].   #Given worsening right lower extremity swelling- [see discussion below]-I am concerned about clinical progression.   #I had a long discussion the patient and family that given her multiple comorbidities/declining performance status-patient at high risk for potential side effects from chemotherapy.  Also discussed that benefits of the treatment are outweighed by the risk of treatment.  Treatments might lead to significant decline in the quality of life.  # Worsening shortness of breath on exertion-CXR- today; lasix 20 mg/day; prednisone 20 mg/day; tussinex   #Right lower extremity lymphedema-second malignancy status post radiation-worsening.  Continue compression stockings.  #Acute right lower lung PE on Eliquis; stable; but falling [see discussion]  #TIA-at least x2; on Eliquis stable;   #Multiple falls-while on Eliquis secondary to frailty/progressive disease.  Discussed with the patient and family the risk of bleeding/and also fatality from the bleeding.  Discussed that it is reasonable to stop Eliquis given the risks of fall and bleeding/then blood clots.  Await hospice nurse evaluation-I think is reasonable to discontinue Eliquis if patient's risk of fall continues to be high.  #DISPOSITION: note to SNF.  # CXR today;  # hospice referral # Follow-up in 1 month-MD-cbc/cmp/ca-125-Dr.B  # 40 minutes face-to-face with the patient discussing the above plan of care; more than 50% of time spent on prognosis/ natural history;  counseling and coordination.   Cc; Dr.Thies.    Orders Placed This Encounter  Procedures  . DG Chest 2 View    Standing Status:   Future    Number of Occurrences:   1    Standing Expiration Date:   05/18/2019    Order Specific Question:   Reason for Exam (SYMPTOM  OR DIAGNOSIS REQUIRED)    Answer:   cough    Order  Specific Question:   Preferred imaging location?    Answer:   [facility]   All questions were answered. The patient knows to call the clinic with any problems, questions or concerns.      [name], MD 03/18/2018 12:27 PM

## 2018-03-18 NOTE — Progress Notes (Signed)
Patient here for follow-up with Dr. Rogue Bussing. C/o persistent bronchial cough/shortness of breath and post nasal congestion. Patient reports lower extremity weakness. Daughter

## 2018-03-19 ENCOUNTER — Telehealth: Payer: Self-pay | Admitting: Student

## 2018-03-19 LAB — CA 125: Cancer Antigen (CA) 125: 108 U/mL — ABNORMAL HIGH (ref 0.0–38.1)

## 2018-03-19 NOTE — Telephone Encounter (Signed)
Palliative NP spoke with patient's daughter to follow up after patient saw Dr. Rogue Bussing yesterday. [name] states patient has decided on Hospice services. Explained that once order is received, Hospice will set up a time to come out and admit her to services. She is encouraged to call with any questions.

## 2018-03-23 DIAGNOSIS — Z741 Need for assistance with personal care: Secondary | ICD-10-CM | POA: Diagnosis not present

## 2018-03-23 DIAGNOSIS — Z90722 Acquired absence of ovaries, bilateral: Secondary | ICD-10-CM | POA: Diagnosis not present

## 2018-03-23 DIAGNOSIS — Z8673 Personal history of transient ischemic attack (TIA), and cerebral infarction without residual deficits: Secondary | ICD-10-CM | POA: Diagnosis not present

## 2018-03-23 DIAGNOSIS — D630 Anemia in neoplastic disease: Secondary | ICD-10-CM | POA: Diagnosis not present

## 2018-03-23 DIAGNOSIS — I059 Rheumatic mitral valve disease, unspecified: Secondary | ICD-10-CM | POA: Diagnosis not present

## 2018-03-23 DIAGNOSIS — K900 Celiac disease: Secondary | ICD-10-CM | POA: Diagnosis not present

## 2018-03-23 DIAGNOSIS — I10 Essential (primary) hypertension: Secondary | ICD-10-CM | POA: Diagnosis not present

## 2018-03-23 DIAGNOSIS — I890 Lymphedema, not elsewhere classified: Secondary | ICD-10-CM | POA: Diagnosis not present

## 2018-03-23 DIAGNOSIS — G909 Disorder of the autonomic nervous system, unspecified: Secondary | ICD-10-CM | POA: Diagnosis not present

## 2018-03-23 DIAGNOSIS — Z6824 Body mass index (BMI) 24.0-24.9, adult: Secondary | ICD-10-CM | POA: Diagnosis not present

## 2018-03-23 DIAGNOSIS — J189 Pneumonia, unspecified organism: Secondary | ICD-10-CM | POA: Diagnosis not present

## 2018-03-23 DIAGNOSIS — E785 Hyperlipidemia, unspecified: Secondary | ICD-10-CM | POA: Diagnosis not present

## 2018-03-23 DIAGNOSIS — Z86711 Personal history of pulmonary embolism: Secondary | ICD-10-CM | POA: Diagnosis not present

## 2018-03-23 DIAGNOSIS — K219 Gastro-esophageal reflux disease without esophagitis: Secondary | ICD-10-CM | POA: Diagnosis not present

## 2018-03-23 DIAGNOSIS — C5701 Malignant neoplasm of right fallopian tube: Secondary | ICD-10-CM | POA: Diagnosis not present

## 2018-03-23 DIAGNOSIS — M1990 Unspecified osteoarthritis, unspecified site: Secondary | ICD-10-CM | POA: Diagnosis not present

## 2018-03-23 DIAGNOSIS — M810 Age-related osteoporosis without current pathological fracture: Secondary | ICD-10-CM | POA: Diagnosis not present

## 2018-03-23 DIAGNOSIS — C774 Secondary and unspecified malignant neoplasm of inguinal and lower limb lymph nodes: Secondary | ICD-10-CM | POA: Diagnosis not present

## 2018-03-23 DIAGNOSIS — Z86718 Personal history of other venous thrombosis and embolism: Secondary | ICD-10-CM | POA: Diagnosis not present

## 2018-03-23 DIAGNOSIS — R296 Repeated falls: Secondary | ICD-10-CM | POA: Diagnosis not present

## 2018-03-24 DIAGNOSIS — C5701 Malignant neoplasm of right fallopian tube: Secondary | ICD-10-CM | POA: Diagnosis not present

## 2018-03-24 DIAGNOSIS — E785 Hyperlipidemia, unspecified: Secondary | ICD-10-CM | POA: Diagnosis not present

## 2018-03-24 DIAGNOSIS — C774 Secondary and unspecified malignant neoplasm of inguinal and lower limb lymph nodes: Secondary | ICD-10-CM | POA: Diagnosis not present

## 2018-03-24 DIAGNOSIS — I10 Essential (primary) hypertension: Secondary | ICD-10-CM | POA: Diagnosis not present

## 2018-03-24 DIAGNOSIS — D630 Anemia in neoplastic disease: Secondary | ICD-10-CM | POA: Diagnosis not present

## 2018-03-24 DIAGNOSIS — I890 Lymphedema, not elsewhere classified: Secondary | ICD-10-CM | POA: Diagnosis not present

## 2018-03-31 ENCOUNTER — Telehealth: Payer: Self-pay | Admitting: *Deleted

## 2018-03-31 DIAGNOSIS — I10 Essential (primary) hypertension: Secondary | ICD-10-CM | POA: Diagnosis not present

## 2018-03-31 DIAGNOSIS — C774 Secondary and unspecified malignant neoplasm of inguinal and lower limb lymph nodes: Secondary | ICD-10-CM | POA: Diagnosis not present

## 2018-03-31 DIAGNOSIS — D630 Anemia in neoplastic disease: Secondary | ICD-10-CM | POA: Diagnosis not present

## 2018-03-31 DIAGNOSIS — C5701 Malignant neoplasm of right fallopian tube: Secondary | ICD-10-CM | POA: Diagnosis not present

## 2018-03-31 DIAGNOSIS — I890 Lymphedema, not elsewhere classified: Secondary | ICD-10-CM | POA: Diagnosis not present

## 2018-03-31 DIAGNOSIS — E785 Hyperlipidemia, unspecified: Secondary | ICD-10-CM | POA: Diagnosis not present

## 2018-03-31 NOTE — Telephone Encounter (Signed)
Fax machine is not working so requested that I scan and email order to [email or web address]. which I did

## 2018-03-31 NOTE — Telephone Encounter (Addendum)
Hospice nurse asking for orders to be faxed to [facility] [phone] to discontinue Eliquis as discussed at last visit , d/c Vit D3, B 12, and C. Patient wants to reduce her pill take and facility needs written orders to stop them

## 2018-03-31 NOTE — Telephone Encounter (Signed)
Order faxed to [facility] after signed by Dr B to d/c Eliquyis, Vit b 12, C, D3 and add ASA 81 mg daily

## 2018-04-02 DIAGNOSIS — E785 Hyperlipidemia, unspecified: Secondary | ICD-10-CM | POA: Diagnosis not present

## 2018-04-02 DIAGNOSIS — I10 Essential (primary) hypertension: Secondary | ICD-10-CM | POA: Diagnosis not present

## 2018-04-02 DIAGNOSIS — D630 Anemia in neoplastic disease: Secondary | ICD-10-CM | POA: Diagnosis not present

## 2018-04-02 DIAGNOSIS — I890 Lymphedema, not elsewhere classified: Secondary | ICD-10-CM | POA: Diagnosis not present

## 2018-04-02 DIAGNOSIS — C774 Secondary and unspecified malignant neoplasm of inguinal and lower limb lymph nodes: Secondary | ICD-10-CM | POA: Diagnosis not present

## 2018-04-02 DIAGNOSIS — C5701 Malignant neoplasm of right fallopian tube: Secondary | ICD-10-CM | POA: Diagnosis not present

## 2018-04-05 DIAGNOSIS — Z8673 Personal history of transient ischemic attack (TIA), and cerebral infarction without residual deficits: Secondary | ICD-10-CM | POA: Diagnosis not present

## 2018-04-05 DIAGNOSIS — R296 Repeated falls: Secondary | ICD-10-CM | POA: Diagnosis not present

## 2018-04-05 DIAGNOSIS — K219 Gastro-esophageal reflux disease without esophagitis: Secondary | ICD-10-CM | POA: Diagnosis not present

## 2018-04-05 DIAGNOSIS — C5701 Malignant neoplasm of right fallopian tube: Secondary | ICD-10-CM | POA: Diagnosis not present

## 2018-04-05 DIAGNOSIS — I10 Essential (primary) hypertension: Secondary | ICD-10-CM | POA: Diagnosis not present

## 2018-04-05 DIAGNOSIS — Z741 Need for assistance with personal care: Secondary | ICD-10-CM | POA: Diagnosis not present

## 2018-04-05 DIAGNOSIS — G909 Disorder of the autonomic nervous system, unspecified: Secondary | ICD-10-CM | POA: Diagnosis not present

## 2018-04-05 DIAGNOSIS — M810 Age-related osteoporosis without current pathological fracture: Secondary | ICD-10-CM | POA: Diagnosis not present

## 2018-04-05 DIAGNOSIS — D630 Anemia in neoplastic disease: Secondary | ICD-10-CM | POA: Diagnosis not present

## 2018-04-05 DIAGNOSIS — M1990 Unspecified osteoarthritis, unspecified site: Secondary | ICD-10-CM | POA: Diagnosis not present

## 2018-04-05 DIAGNOSIS — Z90722 Acquired absence of ovaries, bilateral: Secondary | ICD-10-CM | POA: Diagnosis not present

## 2018-04-05 DIAGNOSIS — C774 Secondary and unspecified malignant neoplasm of inguinal and lower limb lymph nodes: Secondary | ICD-10-CM | POA: Diagnosis not present

## 2018-04-05 DIAGNOSIS — K900 Celiac disease: Secondary | ICD-10-CM | POA: Diagnosis not present

## 2018-04-05 DIAGNOSIS — Z6824 Body mass index (BMI) 24.0-24.9, adult: Secondary | ICD-10-CM | POA: Diagnosis not present

## 2018-04-05 DIAGNOSIS — I890 Lymphedema, not elsewhere classified: Secondary | ICD-10-CM | POA: Diagnosis not present

## 2018-04-05 DIAGNOSIS — Z86711 Personal history of pulmonary embolism: Secondary | ICD-10-CM | POA: Diagnosis not present

## 2018-04-05 DIAGNOSIS — Z86718 Personal history of other venous thrombosis and embolism: Secondary | ICD-10-CM | POA: Diagnosis not present

## 2018-04-05 DIAGNOSIS — I059 Rheumatic mitral valve disease, unspecified: Secondary | ICD-10-CM | POA: Diagnosis not present

## 2018-04-05 DIAGNOSIS — J189 Pneumonia, unspecified organism: Secondary | ICD-10-CM | POA: Diagnosis not present

## 2018-04-05 DIAGNOSIS — E785 Hyperlipidemia, unspecified: Secondary | ICD-10-CM | POA: Diagnosis not present

## 2018-04-07 DIAGNOSIS — D630 Anemia in neoplastic disease: Secondary | ICD-10-CM | POA: Diagnosis not present

## 2018-04-07 DIAGNOSIS — C774 Secondary and unspecified malignant neoplasm of inguinal and lower limb lymph nodes: Secondary | ICD-10-CM | POA: Diagnosis not present

## 2018-04-07 DIAGNOSIS — C5701 Malignant neoplasm of right fallopian tube: Secondary | ICD-10-CM | POA: Diagnosis not present

## 2018-04-07 DIAGNOSIS — I890 Lymphedema, not elsewhere classified: Secondary | ICD-10-CM | POA: Diagnosis not present

## 2018-04-07 DIAGNOSIS — I10 Essential (primary) hypertension: Secondary | ICD-10-CM | POA: Diagnosis not present

## 2018-04-07 DIAGNOSIS — E785 Hyperlipidemia, unspecified: Secondary | ICD-10-CM | POA: Diagnosis not present

## 2018-04-09 DIAGNOSIS — C774 Secondary and unspecified malignant neoplasm of inguinal and lower limb lymph nodes: Secondary | ICD-10-CM | POA: Diagnosis not present

## 2018-04-09 DIAGNOSIS — I10 Essential (primary) hypertension: Secondary | ICD-10-CM | POA: Diagnosis not present

## 2018-04-09 DIAGNOSIS — C5701 Malignant neoplasm of right fallopian tube: Secondary | ICD-10-CM | POA: Diagnosis not present

## 2018-04-09 DIAGNOSIS — D630 Anemia in neoplastic disease: Secondary | ICD-10-CM | POA: Diagnosis not present

## 2018-04-09 DIAGNOSIS — E785 Hyperlipidemia, unspecified: Secondary | ICD-10-CM | POA: Diagnosis not present

## 2018-04-09 DIAGNOSIS — I890 Lymphedema, not elsewhere classified: Secondary | ICD-10-CM | POA: Diagnosis not present

## 2018-04-14 DIAGNOSIS — I10 Essential (primary) hypertension: Secondary | ICD-10-CM | POA: Diagnosis not present

## 2018-04-14 DIAGNOSIS — C774 Secondary and unspecified malignant neoplasm of inguinal and lower limb lymph nodes: Secondary | ICD-10-CM | POA: Diagnosis not present

## 2018-04-14 DIAGNOSIS — C5701 Malignant neoplasm of right fallopian tube: Secondary | ICD-10-CM | POA: Diagnosis not present

## 2018-04-14 DIAGNOSIS — I890 Lymphedema, not elsewhere classified: Secondary | ICD-10-CM | POA: Diagnosis not present

## 2018-04-14 DIAGNOSIS — E785 Hyperlipidemia, unspecified: Secondary | ICD-10-CM | POA: Diagnosis not present

## 2018-04-14 DIAGNOSIS — D630 Anemia in neoplastic disease: Secondary | ICD-10-CM | POA: Diagnosis not present

## 2018-04-15 ENCOUNTER — Encounter: Payer: Self-pay | Admitting: Internal Medicine

## 2018-04-15 ENCOUNTER — Other Ambulatory Visit: Payer: Self-pay

## 2018-04-15 ENCOUNTER — Inpatient Hospital Stay: Payer: Medicare Other | Attending: Internal Medicine | Admitting: Internal Medicine

## 2018-04-15 VITALS — BP 118/79 | HR 89 | Temp 97.6°F | Resp 20 | Ht 60.0 in | Wt 139.0 lb

## 2018-04-15 DIAGNOSIS — I2699 Other pulmonary embolism without acute cor pulmonale: Secondary | ICD-10-CM | POA: Diagnosis not present

## 2018-04-15 DIAGNOSIS — M7989 Other specified soft tissue disorders: Secondary | ICD-10-CM | POA: Diagnosis not present

## 2018-04-15 DIAGNOSIS — R296 Repeated falls: Secondary | ICD-10-CM | POA: Diagnosis not present

## 2018-04-15 DIAGNOSIS — Z8041 Family history of malignant neoplasm of ovary: Secondary | ICD-10-CM

## 2018-04-15 DIAGNOSIS — R0602 Shortness of breath: Secondary | ICD-10-CM | POA: Insufficient documentation

## 2018-04-15 DIAGNOSIS — Z923 Personal history of irradiation: Secondary | ICD-10-CM | POA: Insufficient documentation

## 2018-04-15 DIAGNOSIS — Z8673 Personal history of transient ischemic attack (TIA), and cerebral infarction without residual deficits: Secondary | ICD-10-CM | POA: Insufficient documentation

## 2018-04-15 DIAGNOSIS — Z7901 Long term (current) use of anticoagulants: Secondary | ICD-10-CM

## 2018-04-15 DIAGNOSIS — I890 Lymphedema, not elsewhere classified: Secondary | ICD-10-CM | POA: Diagnosis not present

## 2018-04-15 DIAGNOSIS — C5701 Malignant neoplasm of right fallopian tube: Secondary | ICD-10-CM | POA: Diagnosis not present

## 2018-04-15 DIAGNOSIS — C774 Secondary and unspecified malignant neoplasm of inguinal and lower limb lymph nodes: Secondary | ICD-10-CM

## 2018-04-15 NOTE — Progress Notes (Signed)
[facility] OFFICE PROGRESS NOTE  Patient Care Team: Ezequiel Kayser, [name]  Cancer Staging No matching staging information was found for the patient.   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  Adequate TRS, no macroscopic residual. IP/IV chemotherapy with DDP and paclitaxel on GOG protocol, chemo and Bev consolidation completed in 03/2011 2. 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 November 14, 2011- Patient was started on carboplatin and gemcitabine 4. Finished 6 cycles of chemotherapy in April of 2014 with carboplatin and gemcitabine. Tolerance was fairly good except for neutropenia and thrombocytopenia. 5.recurrent disease by CT scan February of 2015  # .radiation therapy to pelvis  May of 2015  7.upper extremity  . and deep vein thrombosis associated with port.(June of 2015) patient started on   [place] had port removed and had   thrombectomy September 06, 2013. # CT DEC 2016- progressing disease in the right inguinal area # FEB-TAXOL-AVASTIN;  # FEB 2017- AVASTIN q 3W; July 2017- CT- Improved  Right Inguinal LN; STOP AVASTIN sec to potential concerns of AEs  # AUG 2017 3rd- START ZEJULA- declines sec to intol  # OCT 7th CT- STABLE RIGHT INGUINAL LN- RE-START AVASTIN q 3W [STOPPED oct 25th 2018]; Avastin discontinued October 2018 [stroke/PE]  # MAY 2019- Right inguinal adenopathy progression status post radiation [June 2019]   #TIA x2 [name] 2019 last]; PE [[facility]]; right lower extremity lymphedema-radiation/malignancy  ------------------------------------------------ MOLECULAR TESTING: somatic BRCA negative; F-ONE: No targets*   Dx: SEP 2013  fallopian tube/platinum sensitive high-grade serous-cancer  Current/most recent therapy-surveillance.  Goals: palliative; stage: Metastatic/Recurrent - IV          Malignant neoplasm of right fallopian tube [facility])      INTERVAL HISTORY:  Nicole Clayton 85 y.o.  female pleasant patient above history of metastatic fallopian tube high-grade serous most recently status post radiation to her right inguinal region is here for follow-up.  At the last visit patient was started on Lasix for her shortness of breath.  Currently patient states her shortness of much better.  Swelling in the legs improved.  Cough improved.  Review of Systems  Constitutional: Positive for malaise/fatigue. Negative for chills, diaphoresis, fever and weight loss.  HENT: Negative for nosebleeds and sore throat.   Eyes: Negative for double vision.  Respiratory: Positive for cough and shortness of breath. Negative for hemoptysis and wheezing.   Cardiovascular: Positive for leg swelling. Negative for chest pain, palpitations and orthopnea.  Gastrointestinal: Negative for abdominal pain, blood in stool, constipation, diarrhea, heartburn, melena, nausea and vomiting.  Genitourinary: Negative for dysuria, frequency and urgency.  Musculoskeletal: Negative for back pain and joint pain.  Skin: Negative.  Negative for itching and rash.  Neurological: Negative for dizziness, tingling, focal weakness, weakness and headaches.  Endo/Heme/Allergies: Does not bruise/bleed easily.  Psychiatric/Behavioral: Negative for depression. The patient is not nervous/anxious and does not have insomnia.       PAST MEDICAL HISTORY :  Past Medical History:  Diagnosis Date  . Bladder infection, chronic 10/19/2011  . Cancer (HCC)    Ovarian   . Carcinoma of fallopian tube ([place]) 10/05/2009   Overview:  Overview:  Overview:  Drs. Claiborne Rigg and Delorise Shiner Choksi Overview:  Drs. Claiborne Rigg and [employer]   . Concussion   . GERD (gastroesophageal reflux disease)   . MI (mitral incompetence) 10/27/2014  Overview:  MODERATE   . Mitral valve prolapse   . OAB (overactive bladder)   . Ovarian cancer ([place])   .  Pulmonary embolism ([place])   . Tachycardia     PAST SURGICAL HISTORY :   Past Surgical History:  Procedure Laterality Date  . ABDOMINAL HYSTERECTOMY    . CHOLECYSTECTOMY    . LAPAROSCOPIC BILATERAL SALPINGO OOPHERECTOMY  2011  . ovarian cancer surgery  2011    FAMILY HISTORY :   Family History  Problem Relation Age of Onset  . Ovarian cancer Other   . Breast cancer Neg Hx   . Colon cancer Neg Hx   . Diabetes Neg Hx   . Heart disease Neg Hx   . Kidney disease Neg Hx   . Bladder Cancer Neg Hx     SOCIAL HISTORY:   Social History   Tobacco Use  . Smoking status: Never Smoker  . Smokeless tobacco: Never Used  Substance Use Topics  . Alcohol use: No  . Drug use: No    ALLERGIES:  is allergic to benadryl [diphenhydramine]; tamsulosin; alendronate sodium; duloxetine; duloxetine hcl; risedronate sodium; lovastatin; and sulfa antibiotics.  MEDICATIONS:  Current Outpatient Medications  Medication Sig Dispense Refill  . acetaminophen (TYLENOL) 650 MG CR tablet Take 650 mg by mouth 2 (two) times daily as needed for pain.     Marland Kitchen albuterol (PROVENTIL HFA;VENTOLIN HFA) 108 (90 Base) MCG/ACT inhaler Inhale 2 puffs into the lungs every 6 (six) hours as needed for wheezing or shortness of breath. 1 Inhaler 2  . benzonatate (TESSALON) 200 MG capsule Take 1 capsule by mouth 3 (three) times daily.    . calcium carbonate (TUMS - DOSED IN MG ELEMENTAL CALCIUM) 500 MG chewable tablet Chew 2 tablets by mouth every 2 (two) hours as needed for indigestion or heartburn.    . cetirizine (ZYRTEC) 10 MG tablet Take 10 mg by mouth daily.     . cholecalciferol (VITAMIN D) 1000 units tablet Take 1,000 Units by mouth daily.     Marland Kitchen docusate sodium (COLACE) 100 MG capsule Take 100 mg by mouth daily as needed for mild constipation.     . fluticasone (FLONASE) 50 MCG/ACT nasal spray Place 2 sprays into both nostrils daily.     . furosemide (LASIX) 20 MG tablet Take 1 tablet (20 mg total) by mouth daily. 7  tablet 0  . gabapentin (NEURONTIN) 600 MG tablet Take 600 mg by mouth every morning.    . metoprolol succinate (TOPROL-XL) 50 MG 24 hr tablet Take 50 mg by mouth daily.     . Multiple Vitamin (MULTIVITAMIN WITH MINERALS) TABS tablet Take 1 tablet by mouth daily.    . nitrofurantoin, macrocrystal-monohydrate, (MACROBID) 100 MG capsule Take 100 mg by mouth daily.     . pantoprazole (PROTONIX) 40 MG tablet Take 40 mg by mouth daily.     . chlorpheniramine-HYDROcodone (TUSSIONEX) 10-8 MG/5ML SUER Take 5 mLs by mouth at bedtime as needed for cough. (Patient not taking: Reported on 04/15/2018) 140 mL 0  . loperamide (IMODIUM) 2 MG capsule Take 2 mg by mouth as needed for diarrhea or loose stools. Do not exceed 4 capsules in a 24 hour period.    . ondansetron (ZOFRAN) 4 MG tablet Take 4 mg by mouth every 8 (eight) hours as needed for nausea or vomiting.     No current facility-administered medications for this visit.    Facility-Administered Medications Ordered in Other Visits  Medication Dose Route Frequency Provider  Last Rate Last Dose  . diphenhydrAMINE (BENADRYL) injection 50 mg  50 mg Intravenous Once Choksi, Janak, MD      . sodium chloride 0.9 % injection 10 mL  10 mL Intravenous PRN Forest Gleason, MD   10 mL at 01/12/15 1116  . sodium chloride 0.9 % injection 10 mL  10 mL Intravenous PRN Forest Gleason, MD   10 mL at 02/16/15 1025    PHYSICAL EXAMINATION: ECOG PERFORMANCE STATUS: 1 - Symptomatic but completely ambulatory  BP 118/79   Pulse 89   Temp 97.6 F (36.4 C) (Tympanic)   Resp 20   Ht 5' (1.524 m)   Wt 139 lb (63 kg)   BMI 27.15 kg/m   Filed Weights   04/15/18 1111  Weight: 139 lb (63 kg)    Physical Exam  Constitutional: She is oriented to person, place, and time.  Frail-appearing Caucasian female patient.  She is in a wheelchair.  Accompanied by daughter.  HENT:  Head: Normocephalic and atraumatic.  Mouth/Throat: Oropharynx is clear and moist. No oropharyngeal  exudate.  Eyes: Pupils are equal, round, and reactive to light.  Neck: Normal range of motion. Neck supple.  Cardiovascular: Normal rate and regular rhythm.  Pulmonary/Chest: No respiratory distress. She has no wheezes.  Positive for crackles bilaterally at the bases.  Decreased breath sounds.  Abdominal: Soft. Bowel sounds are normal. She exhibits no distension and no mass. There is no abdominal tenderness. There is no rebound and no guarding.  Musculoskeletal: Normal range of motion.        General: No tenderness or edema.     Comments: Bilateral lower extremity swelling right more than left.  Neurological: She is alert and oriented to person, place, and time.  Skin: Skin is warm.  Psychiatric: Affect normal.       LABORATORY DATA:  I have reviewed the data as listed    Component Value Date/Time   NA 136 03/18/2018 0959   NA 138 06/02/2014 0910   K 4.1 03/18/2018 0959   K 3.7 06/02/2014 0910   CL 104 03/18/2018 0959   CL 107 06/02/2014 0910   CO2 26 03/18/2018 0959   CO2 26 06/02/2014 0910   GLUCOSE 106 (H) 03/18/2018 0959   GLUCOSE 106 (H) 06/02/2014 0910   BUN 15 03/18/2018 0959   BUN 17 06/02/2014 0910   CREATININE 0.83 03/18/2018 0959   CREATININE 1.00 06/02/2014 0910   CALCIUM 8.6 (L) 03/18/2018 0959   CALCIUM 8.6 (L) 06/02/2014 0910   PROT 6.9 03/18/2018 0959   PROT 6.3 (L) 06/02/2014 0910   ALBUMIN 3.3 (L) 03/18/2018 0959   ALBUMIN 3.4 (L) 06/02/2014 0910   AST 19 03/18/2018 0959   AST 23 06/02/2014 0910   ALT 10 03/18/2018 0959   ALT 10 (L) 06/02/2014 0910   ALKPHOS 66 03/18/2018 0959   ALKPHOS 46 06/02/2014 0910   BILITOT 0.5 03/18/2018 0959   BILITOT 0.5 06/02/2014 0910   GFRNONAA >60 03/18/2018 0959   GFRNONAA 52 (L) 06/02/2014 0910   GFRAA >60 03/18/2018 0959   GFRAA >60 06/02/2014 0910    No results found for: SPEP, UPEP  Lab Results  Component Value Date   WBC 5.6 03/18/2018   NEUTROABS 3.8 03/18/2018   HGB 11.8 (L) 03/18/2018   HCT 36.7  03/18/2018   MCV 93.1 03/18/2018   PLT 395 03/18/2018      Chemistry      Component Value Date/Time   NA 136 03/18/2018 0959  NA 138 06/02/2014 0910   K 4.1 03/18/2018 0959   K 3.7 06/02/2014 0910   CL 104 03/18/2018 0959   CL 107 06/02/2014 0910   CO2 26 03/18/2018 0959   CO2 26 06/02/2014 0910   BUN 15 03/18/2018 0959   BUN 17 06/02/2014 0910   CREATININE 0.83 03/18/2018 0959   CREATININE 1.00 06/02/2014 0910      Component Value Date/Time   CALCIUM 8.6 (L) 03/18/2018 0959   CALCIUM 8.6 (L) 06/02/2014 0910   ALKPHOS 66 03/18/2018 0959   ALKPHOS 46 06/02/2014 0910   AST 19 03/18/2018 0959   AST 23 06/02/2014 0910   ALT 10 03/18/2018 0959   ALT 10 (L) 06/02/2014 0910   BILITOT 0.5 03/18/2018 0959   BILITOT 0.5 06/02/2014 0910       RADIOGRAPHIC STUDIES: I have personally reviewed the radiological images as listed and agreed with the findings in the report. No results found.   ASSESSMENT & PLAN:  Malignant neoplasm of right fallopian tube [facility]) # Metastatic fallopian tube/platinum sensitive high-grade serous-cancer; with right inguinal lymph node metastases-status post radiation to right inguinal finished middle of June 2019. SEP 2019- CT- mixed response with improvement of right inguinal lymph nodes post radiation; however increasing right pelvic adenopathy; and increasing left inguinal adenopathy [approximately 62m].  Stable  #Patient is a poor candidate for any further chemotherapy given her comorbidities/poor PS.  Continue surveillance.  Agree with palliative care/hospice when appropriate.  # LE swelling/SOB-  improved- continue lasix for now.   #Right lower extremity lymphedema-second malignancy status post radiation-stable  #Acute right lower lung PE on Eliquis; stable; but falling [see discussion]  #TIA-at least x2; on Eliquis stable  #Multiple falls-while on Eliquis secondary to frailty/progressive disease.    #DISPOSITION::  [phone] [phone]/[place]  # Follow-up in 2 month-MD-cbc/cmp/ port flush-Dr.B  Cc; Dr.Thies.    No orders of the defined types were placed in this encounter.  All questions were answered. The patient knows to call the clinic with any problems, questions or concerns.      GCammie Sickle MD 04/19/2018 10:51 AM

## 2018-04-15 NOTE — Assessment & Plan Note (Addendum)
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 SEP 2019- CT- mixed response with improvement of right inguinal lymph nodes post radiation; however increasing right pelvic adenopathy; and increasing left inguinal adenopathy [approximately 53mm].  Stable  #Patient is a poor candidate for any further chemotherapy given her comorbidities/poor PS.  Continue surveillance.  Agree with palliative care/hospice when appropriate.  # LE swelling/SOB-  improved- continue lasix for now.   #Right lower extremity lymphedema-second malignancy status post radiation-stable  #Acute right lower lung PE on Eliquis; stable; but falling [see discussion]  #TIA-at least x2; on Eliquis stable  #Multiple falls-while on Eliquis secondary to frailty/progressive disease.    #DISPOSITION:  [phone]/ [place].  # Follow-up in 2 month-MD-cbc/cmp/ port flush-Dr.B  Cc; Dr.Thies.

## 2018-04-16 DIAGNOSIS — E785 Hyperlipidemia, unspecified: Secondary | ICD-10-CM | POA: Diagnosis not present

## 2018-04-16 DIAGNOSIS — C774 Secondary and unspecified malignant neoplasm of inguinal and lower limb lymph nodes: Secondary | ICD-10-CM | POA: Diagnosis not present

## 2018-04-16 DIAGNOSIS — C5701 Malignant neoplasm of right fallopian tube: Secondary | ICD-10-CM | POA: Diagnosis not present

## 2018-04-16 DIAGNOSIS — I890 Lymphedema, not elsewhere classified: Secondary | ICD-10-CM | POA: Diagnosis not present

## 2018-04-16 DIAGNOSIS — I10 Essential (primary) hypertension: Secondary | ICD-10-CM | POA: Diagnosis not present

## 2018-04-16 DIAGNOSIS — D630 Anemia in neoplastic disease: Secondary | ICD-10-CM | POA: Diagnosis not present

## 2018-04-19 ENCOUNTER — Telehealth: Payer: Self-pay | Admitting: Internal Medicine

## 2018-04-19 NOTE — Telephone Encounter (Signed)
x

## 2018-04-22 DIAGNOSIS — E785 Hyperlipidemia, unspecified: Secondary | ICD-10-CM | POA: Diagnosis not present

## 2018-04-22 DIAGNOSIS — I890 Lymphedema, not elsewhere classified: Secondary | ICD-10-CM | POA: Diagnosis not present

## 2018-04-22 DIAGNOSIS — C774 Secondary and unspecified malignant neoplasm of inguinal and lower limb lymph nodes: Secondary | ICD-10-CM | POA: Diagnosis not present

## 2018-04-22 DIAGNOSIS — I10 Essential (primary) hypertension: Secondary | ICD-10-CM | POA: Diagnosis not present

## 2018-04-22 DIAGNOSIS — C5701 Malignant neoplasm of right fallopian tube: Secondary | ICD-10-CM | POA: Diagnosis not present

## 2018-04-22 DIAGNOSIS — D630 Anemia in neoplastic disease: Secondary | ICD-10-CM | POA: Diagnosis not present

## 2018-04-24 DIAGNOSIS — D630 Anemia in neoplastic disease: Secondary | ICD-10-CM | POA: Diagnosis not present

## 2018-04-24 DIAGNOSIS — I10 Essential (primary) hypertension: Secondary | ICD-10-CM | POA: Diagnosis not present

## 2018-04-24 DIAGNOSIS — C774 Secondary and unspecified malignant neoplasm of inguinal and lower limb lymph nodes: Secondary | ICD-10-CM | POA: Diagnosis not present

## 2018-04-24 DIAGNOSIS — C5701 Malignant neoplasm of right fallopian tube: Secondary | ICD-10-CM | POA: Diagnosis not present

## 2018-04-24 DIAGNOSIS — E785 Hyperlipidemia, unspecified: Secondary | ICD-10-CM | POA: Diagnosis not present

## 2018-04-24 DIAGNOSIS — I890 Lymphedema, not elsewhere classified: Secondary | ICD-10-CM | POA: Diagnosis not present

## 2018-04-28 DIAGNOSIS — C5701 Malignant neoplasm of right fallopian tube: Secondary | ICD-10-CM | POA: Diagnosis not present

## 2018-04-28 DIAGNOSIS — I890 Lymphedema, not elsewhere classified: Secondary | ICD-10-CM | POA: Diagnosis not present

## 2018-04-28 DIAGNOSIS — D630 Anemia in neoplastic disease: Secondary | ICD-10-CM | POA: Diagnosis not present

## 2018-04-28 DIAGNOSIS — E785 Hyperlipidemia, unspecified: Secondary | ICD-10-CM | POA: Diagnosis not present

## 2018-04-28 DIAGNOSIS — I10 Essential (primary) hypertension: Secondary | ICD-10-CM | POA: Diagnosis not present

## 2018-04-28 DIAGNOSIS — C774 Secondary and unspecified malignant neoplasm of inguinal and lower limb lymph nodes: Secondary | ICD-10-CM | POA: Diagnosis not present

## 2018-05-05 DIAGNOSIS — C5701 Malignant neoplasm of right fallopian tube: Secondary | ICD-10-CM | POA: Diagnosis not present

## 2018-05-05 DIAGNOSIS — E785 Hyperlipidemia, unspecified: Secondary | ICD-10-CM | POA: Diagnosis not present

## 2018-05-05 DIAGNOSIS — D630 Anemia in neoplastic disease: Secondary | ICD-10-CM | POA: Diagnosis not present

## 2018-05-05 DIAGNOSIS — C774 Secondary and unspecified malignant neoplasm of inguinal and lower limb lymph nodes: Secondary | ICD-10-CM | POA: Diagnosis not present

## 2018-05-05 DIAGNOSIS — I890 Lymphedema, not elsewhere classified: Secondary | ICD-10-CM | POA: Diagnosis not present

## 2018-05-05 DIAGNOSIS — I10 Essential (primary) hypertension: Secondary | ICD-10-CM | POA: Diagnosis not present

## 2018-05-06 ENCOUNTER — Telehealth: Payer: Self-pay | Admitting: *Deleted

## 2018-05-06 DIAGNOSIS — I890 Lymphedema, not elsewhere classified: Secondary | ICD-10-CM | POA: Diagnosis not present

## 2018-05-06 DIAGNOSIS — Z741 Need for assistance with personal care: Secondary | ICD-10-CM | POA: Diagnosis not present

## 2018-05-06 DIAGNOSIS — I10 Essential (primary) hypertension: Secondary | ICD-10-CM | POA: Diagnosis not present

## 2018-05-06 DIAGNOSIS — Z8673 Personal history of transient ischemic attack (TIA), and cerebral infarction without residual deficits: Secondary | ICD-10-CM | POA: Diagnosis not present

## 2018-05-06 DIAGNOSIS — K900 Celiac disease: Secondary | ICD-10-CM | POA: Diagnosis not present

## 2018-05-06 DIAGNOSIS — C5701 Malignant neoplasm of right fallopian tube: Secondary | ICD-10-CM | POA: Diagnosis not present

## 2018-05-06 DIAGNOSIS — J189 Pneumonia, unspecified organism: Secondary | ICD-10-CM | POA: Diagnosis not present

## 2018-05-06 DIAGNOSIS — E785 Hyperlipidemia, unspecified: Secondary | ICD-10-CM | POA: Diagnosis not present

## 2018-05-06 DIAGNOSIS — R296 Repeated falls: Secondary | ICD-10-CM | POA: Diagnosis not present

## 2018-05-06 DIAGNOSIS — I059 Rheumatic mitral valve disease, unspecified: Secondary | ICD-10-CM | POA: Diagnosis not present

## 2018-05-06 DIAGNOSIS — K219 Gastro-esophageal reflux disease without esophagitis: Secondary | ICD-10-CM | POA: Diagnosis not present

## 2018-05-06 DIAGNOSIS — D630 Anemia in neoplastic disease: Secondary | ICD-10-CM | POA: Diagnosis not present

## 2018-05-06 DIAGNOSIS — Z6824 Body mass index (BMI) 24.0-24.9, adult: Secondary | ICD-10-CM | POA: Diagnosis not present

## 2018-05-06 DIAGNOSIS — Z90722 Acquired absence of ovaries, bilateral: Secondary | ICD-10-CM | POA: Diagnosis not present

## 2018-05-06 DIAGNOSIS — M1990 Unspecified osteoarthritis, unspecified site: Secondary | ICD-10-CM | POA: Diagnosis not present

## 2018-05-06 DIAGNOSIS — Z86711 Personal history of pulmonary embolism: Secondary | ICD-10-CM | POA: Diagnosis not present

## 2018-05-06 DIAGNOSIS — Z86718 Personal history of other venous thrombosis and embolism: Secondary | ICD-10-CM | POA: Diagnosis not present

## 2018-05-06 DIAGNOSIS — G909 Disorder of the autonomic nervous system, unspecified: Secondary | ICD-10-CM | POA: Diagnosis not present

## 2018-05-06 DIAGNOSIS — M810 Age-related osteoporosis without current pathological fracture: Secondary | ICD-10-CM | POA: Diagnosis not present

## 2018-05-06 DIAGNOSIS — C774 Secondary and unspecified malignant neoplasm of inguinal and lower limb lymph nodes: Secondary | ICD-10-CM | POA: Diagnosis not present

## 2018-05-06 MED ORDER — GABAPENTIN 300 MG PO CAPS: 300.0000 mg | ORAL_CAPSULE | Freq: Every day | ORAL | 3 refills | Status: DC

## 2018-05-06 NOTE — Telephone Encounter (Signed)
I ended up emailing the order to [facility] as I did with the last order due to fax machine not working

## 2018-05-06 NOTE — Telephone Encounter (Signed)
Hospice nurse called reporting that patient's feet are numb, she is currently on 600 mg of Gabapentin daily. Asking for increase in it or that something else be ordered.Please advise

## 2018-05-06 NOTE — Telephone Encounter (Signed)
Per VO Dr Rogue Bussing, add 300 mg to current daily dose if taking 600 mg in AM, then take 300 mg at hs. Katie informed Order needs to be faxed to facility also. [phone]

## 2018-05-13 ENCOUNTER — Encounter: Payer: Medicare HMO | Admitting: Obstetrics and Gynecology

## 2018-05-14 ENCOUNTER — Telehealth: Payer: Self-pay | Admitting: *Deleted

## 2018-05-14 NOTE — Telephone Encounter (Signed)
Hospice nurse called reporting that patient lymphedema is causing her discomfort and patient does not want to do diuretics. Asking what can be done to ease patient. Please advise

## 2018-05-15 NOTE — Telephone Encounter (Signed)
I spoke with Joellen Jersey, RN from hospice. Patient has had chronic and severe lymphedema in LE. Nurse was asking about use of diuretics to treat lymphedema, although apparently patient is not interested in diuretics. I explained that diuretics are generally not indicated for lymphedema. We discussed use of compression stockings, fall mitigation strategies, and pain management. Will reach out to lymphedema clinic at [facility], although I am not sure if they are seeing patients in the context of COVID-19.

## 2018-05-16 DIAGNOSIS — I10 Essential (primary) hypertension: Secondary | ICD-10-CM | POA: Diagnosis not present

## 2018-05-16 DIAGNOSIS — E785 Hyperlipidemia, unspecified: Secondary | ICD-10-CM | POA: Diagnosis not present

## 2018-05-16 DIAGNOSIS — C774 Secondary and unspecified malignant neoplasm of inguinal and lower limb lymph nodes: Secondary | ICD-10-CM | POA: Diagnosis not present

## 2018-05-16 DIAGNOSIS — I890 Lymphedema, not elsewhere classified: Secondary | ICD-10-CM | POA: Diagnosis not present

## 2018-05-16 DIAGNOSIS — C5701 Malignant neoplasm of right fallopian tube: Secondary | ICD-10-CM | POA: Diagnosis not present

## 2018-05-16 DIAGNOSIS — D630 Anemia in neoplastic disease: Secondary | ICD-10-CM | POA: Diagnosis not present

## 2018-05-19 DIAGNOSIS — I890 Lymphedema, not elsewhere classified: Secondary | ICD-10-CM | POA: Diagnosis not present

## 2018-05-19 DIAGNOSIS — I10 Essential (primary) hypertension: Secondary | ICD-10-CM | POA: Diagnosis not present

## 2018-05-19 DIAGNOSIS — C774 Secondary and unspecified malignant neoplasm of inguinal and lower limb lymph nodes: Secondary | ICD-10-CM | POA: Diagnosis not present

## 2018-05-19 DIAGNOSIS — D630 Anemia in neoplastic disease: Secondary | ICD-10-CM | POA: Diagnosis not present

## 2018-05-19 DIAGNOSIS — E785 Hyperlipidemia, unspecified: Secondary | ICD-10-CM | POA: Diagnosis not present

## 2018-05-19 DIAGNOSIS — C5701 Malignant neoplasm of right fallopian tube: Secondary | ICD-10-CM | POA: Diagnosis not present

## 2018-05-26 DIAGNOSIS — E785 Hyperlipidemia, unspecified: Secondary | ICD-10-CM | POA: Diagnosis not present

## 2018-05-26 DIAGNOSIS — I890 Lymphedema, not elsewhere classified: Secondary | ICD-10-CM | POA: Diagnosis not present

## 2018-05-26 DIAGNOSIS — D630 Anemia in neoplastic disease: Secondary | ICD-10-CM | POA: Diagnosis not present

## 2018-05-26 DIAGNOSIS — C5701 Malignant neoplasm of right fallopian tube: Secondary | ICD-10-CM | POA: Diagnosis not present

## 2018-05-26 DIAGNOSIS — C774 Secondary and unspecified malignant neoplasm of inguinal and lower limb lymph nodes: Secondary | ICD-10-CM | POA: Diagnosis not present

## 2018-05-26 DIAGNOSIS — I10 Essential (primary) hypertension: Secondary | ICD-10-CM | POA: Diagnosis not present

## 2018-06-01 DIAGNOSIS — D630 Anemia in neoplastic disease: Secondary | ICD-10-CM | POA: Diagnosis not present

## 2018-06-01 DIAGNOSIS — E785 Hyperlipidemia, unspecified: Secondary | ICD-10-CM | POA: Diagnosis not present

## 2018-06-01 DIAGNOSIS — I10 Essential (primary) hypertension: Secondary | ICD-10-CM | POA: Diagnosis not present

## 2018-06-01 DIAGNOSIS — C5701 Malignant neoplasm of right fallopian tube: Secondary | ICD-10-CM | POA: Diagnosis not present

## 2018-06-01 DIAGNOSIS — I890 Lymphedema, not elsewhere classified: Secondary | ICD-10-CM | POA: Diagnosis not present

## 2018-06-01 DIAGNOSIS — C774 Secondary and unspecified malignant neoplasm of inguinal and lower limb lymph nodes: Secondary | ICD-10-CM | POA: Diagnosis not present

## 2018-06-05 DIAGNOSIS — D630 Anemia in neoplastic disease: Secondary | ICD-10-CM | POA: Diagnosis not present

## 2018-06-05 DIAGNOSIS — C774 Secondary and unspecified malignant neoplasm of inguinal and lower limb lymph nodes: Secondary | ICD-10-CM | POA: Diagnosis not present

## 2018-06-05 DIAGNOSIS — I10 Essential (primary) hypertension: Secondary | ICD-10-CM | POA: Diagnosis not present

## 2018-06-05 DIAGNOSIS — G909 Disorder of the autonomic nervous system, unspecified: Secondary | ICD-10-CM | POA: Diagnosis not present

## 2018-06-05 DIAGNOSIS — Z86718 Personal history of other venous thrombosis and embolism: Secondary | ICD-10-CM | POA: Diagnosis not present

## 2018-06-05 DIAGNOSIS — Z90722 Acquired absence of ovaries, bilateral: Secondary | ICD-10-CM | POA: Diagnosis not present

## 2018-06-05 DIAGNOSIS — M810 Age-related osteoporosis without current pathological fracture: Secondary | ICD-10-CM | POA: Diagnosis not present

## 2018-06-05 DIAGNOSIS — I890 Lymphedema, not elsewhere classified: Secondary | ICD-10-CM | POA: Diagnosis not present

## 2018-06-05 DIAGNOSIS — R296 Repeated falls: Secondary | ICD-10-CM | POA: Diagnosis not present

## 2018-06-05 DIAGNOSIS — E785 Hyperlipidemia, unspecified: Secondary | ICD-10-CM | POA: Diagnosis not present

## 2018-06-05 DIAGNOSIS — C5701 Malignant neoplasm of right fallopian tube: Secondary | ICD-10-CM | POA: Diagnosis not present

## 2018-06-05 DIAGNOSIS — Z741 Need for assistance with personal care: Secondary | ICD-10-CM | POA: Diagnosis not present

## 2018-06-05 DIAGNOSIS — Z6824 Body mass index (BMI) 24.0-24.9, adult: Secondary | ICD-10-CM | POA: Diagnosis not present

## 2018-06-05 DIAGNOSIS — I059 Rheumatic mitral valve disease, unspecified: Secondary | ICD-10-CM | POA: Diagnosis not present

## 2018-06-05 DIAGNOSIS — K900 Celiac disease: Secondary | ICD-10-CM | POA: Diagnosis not present

## 2018-06-05 DIAGNOSIS — K219 Gastro-esophageal reflux disease without esophagitis: Secondary | ICD-10-CM | POA: Diagnosis not present

## 2018-06-05 DIAGNOSIS — Z8673 Personal history of transient ischemic attack (TIA), and cerebral infarction without residual deficits: Secondary | ICD-10-CM | POA: Diagnosis not present

## 2018-06-05 DIAGNOSIS — M1990 Unspecified osteoarthritis, unspecified site: Secondary | ICD-10-CM | POA: Diagnosis not present

## 2018-06-05 DIAGNOSIS — Z86711 Personal history of pulmonary embolism: Secondary | ICD-10-CM | POA: Diagnosis not present

## 2018-06-05 DIAGNOSIS — J189 Pneumonia, unspecified organism: Secondary | ICD-10-CM | POA: Diagnosis not present

## 2018-06-10 ENCOUNTER — Telehealth: Payer: Self-pay | Admitting: *Deleted

## 2018-06-10 NOTE — Telephone Encounter (Signed)
I contacted the pt's daughter to discuss the patient's upcoming apt on 5/12. Daughter would like this apt cnl for now given the current covid restrictions. She does not want to R/S  Daughter stated that she would be in the process of calling Katie ([phone]) with Hospice to discuss her mom's pain mgmt.  Patient reports increasing abd. Pain. Per daughter pt does not want morphine. Please collaborate with hospice team for pain mgmt.    Daughter can be reached at [phone] to discuss her mom's care if needed.

## 2018-06-12 ENCOUNTER — Other Ambulatory Visit: Payer: Self-pay | Admitting: Nurse Practitioner

## 2018-06-12 DIAGNOSIS — C774 Secondary and unspecified malignant neoplasm of inguinal and lower limb lymph nodes: Secondary | ICD-10-CM | POA: Diagnosis not present

## 2018-06-12 DIAGNOSIS — E785 Hyperlipidemia, unspecified: Secondary | ICD-10-CM | POA: Diagnosis not present

## 2018-06-12 DIAGNOSIS — I10 Essential (primary) hypertension: Secondary | ICD-10-CM | POA: Diagnosis not present

## 2018-06-12 DIAGNOSIS — C5701 Malignant neoplasm of right fallopian tube: Secondary | ICD-10-CM | POA: Diagnosis not present

## 2018-06-12 DIAGNOSIS — I890 Lymphedema, not elsewhere classified: Secondary | ICD-10-CM | POA: Diagnosis not present

## 2018-06-12 DIAGNOSIS — D630 Anemia in neoplastic disease: Secondary | ICD-10-CM | POA: Diagnosis not present

## 2018-06-12 MED ORDER — ACETAMINOPHEN-CODEINE #3 300-30 MG PO TABS: 1.0000 | ORAL_TABLET | ORAL | 0 refills | Status: DC | PRN

## 2018-06-12 NOTE — Telephone Encounter (Signed)
Dr. Arsenio Katz or Sonia Baller, please advise on pain mgmt.

## 2018-06-12 NOTE — Telephone Encounter (Signed)
Yes- I called Katie who was not aware of pain concerns. She will follow up with the patient and call me back.

## 2018-06-12 NOTE — Telephone Encounter (Signed)
Nicole Clayton, You took care of this?

## 2018-06-17 ENCOUNTER — Other Ambulatory Visit

## 2018-06-17 ENCOUNTER — Ambulatory Visit: Admitting: Internal Medicine

## 2018-06-23 ENCOUNTER — Other Ambulatory Visit: Payer: Self-pay | Admitting: Internal Medicine

## 2018-06-23 ENCOUNTER — Telehealth: Payer: Self-pay | Admitting: *Deleted

## 2018-06-23 DIAGNOSIS — I890 Lymphedema, not elsewhere classified: Secondary | ICD-10-CM | POA: Diagnosis not present

## 2018-06-23 DIAGNOSIS — C5701 Malignant neoplasm of right fallopian tube: Secondary | ICD-10-CM | POA: Diagnosis not present

## 2018-06-23 DIAGNOSIS — I10 Essential (primary) hypertension: Secondary | ICD-10-CM | POA: Diagnosis not present

## 2018-06-23 DIAGNOSIS — C774 Secondary and unspecified malignant neoplasm of inguinal and lower limb lymph nodes: Secondary | ICD-10-CM | POA: Diagnosis not present

## 2018-06-23 DIAGNOSIS — D630 Anemia in neoplastic disease: Secondary | ICD-10-CM | POA: Diagnosis not present

## 2018-06-23 DIAGNOSIS — E785 Hyperlipidemia, unspecified: Secondary | ICD-10-CM | POA: Diagnosis not present

## 2018-06-23 MED ORDER — OXYBUTYNIN CHLORIDE ER 10 MG PO TB24: 10.0000 mg | ORAL_TABLET | Freq: Every day | ORAL | 4 refills | Status: DC

## 2018-06-23 NOTE — Telephone Encounter (Signed)
Katie informed.

## 2018-06-23 NOTE — Telephone Encounter (Signed)
Will send script to pharmacy.  GB

## 2018-06-23 NOTE — Telephone Encounter (Signed)
Hospice nurse called reporting that patient is having urinary frequency and incontinence getting worse the past few weeks. Asking if Dr B would consider ordering Oxybutynin 5 mg daily which hospice will cover. Please send prescription to [facility] if in agreement or call her with other orders

## 2018-07-01 DIAGNOSIS — C5701 Malignant neoplasm of right fallopian tube: Secondary | ICD-10-CM | POA: Diagnosis not present

## 2018-07-01 DIAGNOSIS — D630 Anemia in neoplastic disease: Secondary | ICD-10-CM | POA: Diagnosis not present

## 2018-07-01 DIAGNOSIS — I890 Lymphedema, not elsewhere classified: Secondary | ICD-10-CM | POA: Diagnosis not present

## 2018-07-01 DIAGNOSIS — C774 Secondary and unspecified malignant neoplasm of inguinal and lower limb lymph nodes: Secondary | ICD-10-CM | POA: Diagnosis not present

## 2018-07-01 DIAGNOSIS — E785 Hyperlipidemia, unspecified: Secondary | ICD-10-CM | POA: Diagnosis not present

## 2018-07-01 DIAGNOSIS — I10 Essential (primary) hypertension: Secondary | ICD-10-CM | POA: Diagnosis not present

## 2018-07-06 DIAGNOSIS — M810 Age-related osteoporosis without current pathological fracture: Secondary | ICD-10-CM | POA: Diagnosis not present

## 2018-07-06 DIAGNOSIS — I10 Essential (primary) hypertension: Secondary | ICD-10-CM | POA: Diagnosis not present

## 2018-07-06 DIAGNOSIS — M1990 Unspecified osteoarthritis, unspecified site: Secondary | ICD-10-CM | POA: Diagnosis not present

## 2018-07-06 DIAGNOSIS — R296 Repeated falls: Secondary | ICD-10-CM | POA: Diagnosis not present

## 2018-07-06 DIAGNOSIS — Z90722 Acquired absence of ovaries, bilateral: Secondary | ICD-10-CM | POA: Diagnosis not present

## 2018-07-06 DIAGNOSIS — Z86718 Personal history of other venous thrombosis and embolism: Secondary | ICD-10-CM | POA: Diagnosis not present

## 2018-07-06 DIAGNOSIS — Z8673 Personal history of transient ischemic attack (TIA), and cerebral infarction without residual deficits: Secondary | ICD-10-CM | POA: Diagnosis not present

## 2018-07-06 DIAGNOSIS — I059 Rheumatic mitral valve disease, unspecified: Secondary | ICD-10-CM | POA: Diagnosis not present

## 2018-07-06 DIAGNOSIS — E785 Hyperlipidemia, unspecified: Secondary | ICD-10-CM | POA: Diagnosis not present

## 2018-07-06 DIAGNOSIS — Z86711 Personal history of pulmonary embolism: Secondary | ICD-10-CM | POA: Diagnosis not present

## 2018-07-06 DIAGNOSIS — Z6825 Body mass index (BMI) 25.0-25.9, adult: Secondary | ICD-10-CM | POA: Diagnosis not present

## 2018-07-06 DIAGNOSIS — G909 Disorder of the autonomic nervous system, unspecified: Secondary | ICD-10-CM | POA: Diagnosis not present

## 2018-07-06 DIAGNOSIS — D630 Anemia in neoplastic disease: Secondary | ICD-10-CM | POA: Diagnosis not present

## 2018-07-06 DIAGNOSIS — K900 Celiac disease: Secondary | ICD-10-CM | POA: Diagnosis not present

## 2018-07-06 DIAGNOSIS — C774 Secondary and unspecified malignant neoplasm of inguinal and lower limb lymph nodes: Secondary | ICD-10-CM | POA: Diagnosis not present

## 2018-07-06 DIAGNOSIS — Z741 Need for assistance with personal care: Secondary | ICD-10-CM | POA: Diagnosis not present

## 2018-07-06 DIAGNOSIS — I890 Lymphedema, not elsewhere classified: Secondary | ICD-10-CM | POA: Diagnosis not present

## 2018-07-06 DIAGNOSIS — C5701 Malignant neoplasm of right fallopian tube: Secondary | ICD-10-CM | POA: Diagnosis not present

## 2018-07-06 DIAGNOSIS — K219 Gastro-esophageal reflux disease without esophagitis: Secondary | ICD-10-CM | POA: Diagnosis not present

## 2018-07-08 DIAGNOSIS — E785 Hyperlipidemia, unspecified: Secondary | ICD-10-CM | POA: Diagnosis not present

## 2018-07-08 DIAGNOSIS — I890 Lymphedema, not elsewhere classified: Secondary | ICD-10-CM | POA: Diagnosis not present

## 2018-07-08 DIAGNOSIS — C5701 Malignant neoplasm of right fallopian tube: Secondary | ICD-10-CM | POA: Diagnosis not present

## 2018-07-08 DIAGNOSIS — D630 Anemia in neoplastic disease: Secondary | ICD-10-CM | POA: Diagnosis not present

## 2018-07-08 DIAGNOSIS — I10 Essential (primary) hypertension: Secondary | ICD-10-CM | POA: Diagnosis not present

## 2018-07-08 DIAGNOSIS — C774 Secondary and unspecified malignant neoplasm of inguinal and lower limb lymph nodes: Secondary | ICD-10-CM | POA: Diagnosis not present

## 2018-07-11 DIAGNOSIS — C774 Secondary and unspecified malignant neoplasm of inguinal and lower limb lymph nodes: Secondary | ICD-10-CM | POA: Diagnosis not present

## 2018-07-11 DIAGNOSIS — D630 Anemia in neoplastic disease: Secondary | ICD-10-CM | POA: Diagnosis not present

## 2018-07-11 DIAGNOSIS — I890 Lymphedema, not elsewhere classified: Secondary | ICD-10-CM | POA: Diagnosis not present

## 2018-07-11 DIAGNOSIS — E785 Hyperlipidemia, unspecified: Secondary | ICD-10-CM | POA: Diagnosis not present

## 2018-07-11 DIAGNOSIS — C5701 Malignant neoplasm of right fallopian tube: Secondary | ICD-10-CM | POA: Diagnosis not present

## 2018-07-11 DIAGNOSIS — I10 Essential (primary) hypertension: Secondary | ICD-10-CM | POA: Diagnosis not present

## 2018-07-15 DIAGNOSIS — C5701 Malignant neoplasm of right fallopian tube: Secondary | ICD-10-CM | POA: Diagnosis not present

## 2018-07-15 DIAGNOSIS — I10 Essential (primary) hypertension: Secondary | ICD-10-CM | POA: Diagnosis not present

## 2018-07-15 DIAGNOSIS — I890 Lymphedema, not elsewhere classified: Secondary | ICD-10-CM | POA: Diagnosis not present

## 2018-07-15 DIAGNOSIS — E785 Hyperlipidemia, unspecified: Secondary | ICD-10-CM | POA: Diagnosis not present

## 2018-07-15 DIAGNOSIS — D630 Anemia in neoplastic disease: Secondary | ICD-10-CM | POA: Diagnosis not present

## 2018-07-15 DIAGNOSIS — C774 Secondary and unspecified malignant neoplasm of inguinal and lower limb lymph nodes: Secondary | ICD-10-CM | POA: Diagnosis not present

## 2018-07-22 DIAGNOSIS — I10 Essential (primary) hypertension: Secondary | ICD-10-CM | POA: Diagnosis not present

## 2018-07-22 DIAGNOSIS — I890 Lymphedema, not elsewhere classified: Secondary | ICD-10-CM | POA: Diagnosis not present

## 2018-07-22 DIAGNOSIS — E785 Hyperlipidemia, unspecified: Secondary | ICD-10-CM | POA: Diagnosis not present

## 2018-07-22 DIAGNOSIS — D630 Anemia in neoplastic disease: Secondary | ICD-10-CM | POA: Diagnosis not present

## 2018-07-22 DIAGNOSIS — C5701 Malignant neoplasm of right fallopian tube: Secondary | ICD-10-CM | POA: Diagnosis not present

## 2018-07-22 DIAGNOSIS — C774 Secondary and unspecified malignant neoplasm of inguinal and lower limb lymph nodes: Secondary | ICD-10-CM | POA: Diagnosis not present

## 2018-07-29 DIAGNOSIS — C774 Secondary and unspecified malignant neoplasm of inguinal and lower limb lymph nodes: Secondary | ICD-10-CM | POA: Diagnosis not present

## 2018-07-29 DIAGNOSIS — E785 Hyperlipidemia, unspecified: Secondary | ICD-10-CM | POA: Diagnosis not present

## 2018-07-29 DIAGNOSIS — I890 Lymphedema, not elsewhere classified: Secondary | ICD-10-CM | POA: Diagnosis not present

## 2018-07-29 DIAGNOSIS — D630 Anemia in neoplastic disease: Secondary | ICD-10-CM | POA: Diagnosis not present

## 2018-07-29 DIAGNOSIS — I10 Essential (primary) hypertension: Secondary | ICD-10-CM | POA: Diagnosis not present

## 2018-07-29 DIAGNOSIS — C5701 Malignant neoplasm of right fallopian tube: Secondary | ICD-10-CM | POA: Diagnosis not present

## 2018-08-05 DIAGNOSIS — Z86718 Personal history of other venous thrombosis and embolism: Secondary | ICD-10-CM | POA: Diagnosis not present

## 2018-08-05 DIAGNOSIS — M1990 Unspecified osteoarthritis, unspecified site: Secondary | ICD-10-CM | POA: Diagnosis not present

## 2018-08-05 DIAGNOSIS — Z6825 Body mass index (BMI) 25.0-25.9, adult: Secondary | ICD-10-CM | POA: Diagnosis not present

## 2018-08-05 DIAGNOSIS — Z86711 Personal history of pulmonary embolism: Secondary | ICD-10-CM | POA: Diagnosis not present

## 2018-08-05 DIAGNOSIS — Z741 Need for assistance with personal care: Secondary | ICD-10-CM | POA: Diagnosis not present

## 2018-08-05 DIAGNOSIS — Z90722 Acquired absence of ovaries, bilateral: Secondary | ICD-10-CM | POA: Diagnosis not present

## 2018-08-05 DIAGNOSIS — I10 Essential (primary) hypertension: Secondary | ICD-10-CM | POA: Diagnosis not present

## 2018-08-05 DIAGNOSIS — D630 Anemia in neoplastic disease: Secondary | ICD-10-CM | POA: Diagnosis not present

## 2018-08-05 DIAGNOSIS — C5701 Malignant neoplasm of right fallopian tube: Secondary | ICD-10-CM | POA: Diagnosis not present

## 2018-08-05 DIAGNOSIS — Z8673 Personal history of transient ischemic attack (TIA), and cerebral infarction without residual deficits: Secondary | ICD-10-CM | POA: Diagnosis not present

## 2018-08-05 DIAGNOSIS — E785 Hyperlipidemia, unspecified: Secondary | ICD-10-CM | POA: Diagnosis not present

## 2018-08-05 DIAGNOSIS — I059 Rheumatic mitral valve disease, unspecified: Secondary | ICD-10-CM | POA: Diagnosis not present

## 2018-08-05 DIAGNOSIS — K900 Celiac disease: Secondary | ICD-10-CM | POA: Diagnosis not present

## 2018-08-05 DIAGNOSIS — C774 Secondary and unspecified malignant neoplasm of inguinal and lower limb lymph nodes: Secondary | ICD-10-CM | POA: Diagnosis not present

## 2018-08-05 DIAGNOSIS — R296 Repeated falls: Secondary | ICD-10-CM | POA: Diagnosis not present

## 2018-08-05 DIAGNOSIS — I890 Lymphedema, not elsewhere classified: Secondary | ICD-10-CM | POA: Diagnosis not present

## 2018-08-05 DIAGNOSIS — M810 Age-related osteoporosis without current pathological fracture: Secondary | ICD-10-CM | POA: Diagnosis not present

## 2018-08-05 DIAGNOSIS — G909 Disorder of the autonomic nervous system, unspecified: Secondary | ICD-10-CM | POA: Diagnosis not present

## 2018-08-05 DIAGNOSIS — K219 Gastro-esophageal reflux disease without esophagitis: Secondary | ICD-10-CM | POA: Diagnosis not present

## 2018-08-08 DIAGNOSIS — I10 Essential (primary) hypertension: Secondary | ICD-10-CM | POA: Diagnosis not present

## 2018-08-08 DIAGNOSIS — C774 Secondary and unspecified malignant neoplasm of inguinal and lower limb lymph nodes: Secondary | ICD-10-CM | POA: Diagnosis not present

## 2018-08-08 DIAGNOSIS — C5701 Malignant neoplasm of right fallopian tube: Secondary | ICD-10-CM | POA: Diagnosis not present

## 2018-08-08 DIAGNOSIS — D630 Anemia in neoplastic disease: Secondary | ICD-10-CM | POA: Diagnosis not present

## 2018-08-08 DIAGNOSIS — E785 Hyperlipidemia, unspecified: Secondary | ICD-10-CM | POA: Diagnosis not present

## 2018-08-08 DIAGNOSIS — I890 Lymphedema, not elsewhere classified: Secondary | ICD-10-CM | POA: Diagnosis not present

## 2018-08-11 ENCOUNTER — Telehealth: Payer: Self-pay | Admitting: *Deleted

## 2018-08-11 ENCOUNTER — Encounter: Payer: Self-pay | Admitting: *Deleted

## 2018-08-11 NOTE — Telephone Encounter (Signed)
Per Dr. B patient may proceed with dental extraction. Clearance letter signed and faxed to dentist

## 2018-08-11 NOTE — Telephone Encounter (Signed)
Incoming call- Patient needs 2 teeth pulled. Needs clearance note faxed from Dr. Rogue Bussing to [facility] in [place] (fax [phone]). Pt is scheduled wed. 7/8

## 2018-08-12 DIAGNOSIS — E785 Hyperlipidemia, unspecified: Secondary | ICD-10-CM | POA: Diagnosis not present

## 2018-08-12 DIAGNOSIS — C774 Secondary and unspecified malignant neoplasm of inguinal and lower limb lymph nodes: Secondary | ICD-10-CM | POA: Diagnosis not present

## 2018-08-12 DIAGNOSIS — C5701 Malignant neoplasm of right fallopian tube: Secondary | ICD-10-CM | POA: Diagnosis not present

## 2018-08-12 DIAGNOSIS — I890 Lymphedema, not elsewhere classified: Secondary | ICD-10-CM | POA: Diagnosis not present

## 2018-08-12 DIAGNOSIS — D630 Anemia in neoplastic disease: Secondary | ICD-10-CM | POA: Diagnosis not present

## 2018-08-12 DIAGNOSIS — I10 Essential (primary) hypertension: Secondary | ICD-10-CM | POA: Diagnosis not present

## 2018-08-14 ENCOUNTER — Other Ambulatory Visit: Payer: Self-pay | Admitting: Internal Medicine

## 2018-08-18 ENCOUNTER — Telehealth: Payer: Self-pay

## 2018-08-18 DIAGNOSIS — D630 Anemia in neoplastic disease: Secondary | ICD-10-CM | POA: Diagnosis not present

## 2018-08-18 DIAGNOSIS — I10 Essential (primary) hypertension: Secondary | ICD-10-CM | POA: Diagnosis not present

## 2018-08-18 DIAGNOSIS — C5701 Malignant neoplasm of right fallopian tube: Secondary | ICD-10-CM | POA: Diagnosis not present

## 2018-08-18 DIAGNOSIS — E785 Hyperlipidemia, unspecified: Secondary | ICD-10-CM | POA: Diagnosis not present

## 2018-08-18 DIAGNOSIS — I890 Lymphedema, not elsewhere classified: Secondary | ICD-10-CM | POA: Diagnosis not present

## 2018-08-18 DIAGNOSIS — C774 Secondary and unspecified malignant neoplasm of inguinal and lower limb lymph nodes: Secondary | ICD-10-CM | POA: Diagnosis not present

## 2018-08-18 NOTE — Telephone Encounter (Signed)
Pt called no answer mailbox full and unable to leave a message. Was calling to do prescreening.

## 2018-08-19 ENCOUNTER — Encounter: Payer: Self-pay | Admitting: Obstetrics and Gynecology

## 2018-08-19 ENCOUNTER — Ambulatory Visit (INDEPENDENT_AMBULATORY_CARE_PROVIDER_SITE_OTHER): Payer: Medicare Other | Admitting: Obstetrics and Gynecology

## 2018-08-19 ENCOUNTER — Other Ambulatory Visit: Payer: Self-pay

## 2018-08-19 VITALS — BP 102/76 | HR 80 | Ht 60.0 in | Wt 140.6 lb

## 2018-08-19 DIAGNOSIS — I890 Lymphedema, not elsewhere classified: Secondary | ICD-10-CM | POA: Diagnosis not present

## 2018-08-19 DIAGNOSIS — R339 Retention of urine, unspecified: Secondary | ICD-10-CM | POA: Diagnosis not present

## 2018-08-19 DIAGNOSIS — Z90710 Acquired absence of both cervix and uterus: Secondary | ICD-10-CM

## 2018-08-19 DIAGNOSIS — R3915 Urgency of urination: Secondary | ICD-10-CM | POA: Diagnosis not present

## 2018-08-19 DIAGNOSIS — Z90721 Acquired absence of ovaries, unilateral: Secondary | ICD-10-CM

## 2018-08-19 DIAGNOSIS — N8111 Cystocele, midline: Secondary | ICD-10-CM | POA: Diagnosis not present

## 2018-08-19 DIAGNOSIS — N952 Postmenopausal atrophic vaginitis: Secondary | ICD-10-CM

## 2018-08-19 DIAGNOSIS — C774 Secondary and unspecified malignant neoplasm of inguinal and lower limb lymph nodes: Secondary | ICD-10-CM | POA: Diagnosis not present

## 2018-08-19 DIAGNOSIS — D630 Anemia in neoplastic disease: Secondary | ICD-10-CM | POA: Diagnosis not present

## 2018-08-19 DIAGNOSIS — I10 Essential (primary) hypertension: Secondary | ICD-10-CM | POA: Diagnosis not present

## 2018-08-19 DIAGNOSIS — Z4689 Encounter for fitting and adjustment of other specified devices: Secondary | ICD-10-CM

## 2018-08-19 DIAGNOSIS — C5700 Malignant neoplasm of unspecified fallopian tube: Secondary | ICD-10-CM

## 2018-08-19 DIAGNOSIS — C5701 Malignant neoplasm of right fallopian tube: Secondary | ICD-10-CM | POA: Diagnosis not present

## 2018-08-19 DIAGNOSIS — E785 Hyperlipidemia, unspecified: Secondary | ICD-10-CM | POA: Diagnosis not present

## 2018-08-19 NOTE — Progress Notes (Signed)
Pt present today for pessary maintenance. Pt stated that she is currently taking medication to help with bladder control and wanted to know if she should still keep the pessary.

## 2018-08-19 NOTE — Progress Notes (Signed)
    GYNECOLOGY PROGRESS NOTE  Subjective:    Patient ID: Nicole Clayton, female    DOB: 10/20/1928, 85 y.o.   MRN: [id number]  HPI  Patient is a 85 y.o. G22P3003 female who presents for pessary check.  Last check was with Dr. Hassell Done Defrancesco in 11/2017.  She has a history of cystocele (Grade 2) with incomplete bladder emptying, history of recurrent UTI's, is s/p remote history of hysterectomy with oophorectomy. Currently using Size 2 ring pessary with support.    Of note, patient's daughter reports that patient's husband passed in December 2019, which is why they cancelled her recent follow up.  Then with the onset of COVID she has delayed coming in.  She currently denies any vaginal bleeding or discomfort with her pessary.  She is curious as to whether or not she still needs the pessary.  She was recently started last month on a medication to help her bladder as she is having continuous leakage (started on Ditropan) by her facility.  Patient notes it has helped some.   Lastly, patient has a history of fallopian tube cancer, discontinued her radiation after recurrence in the inguinal area around 8 months ago.    The following portions of the patient's history were reviewed and updated as appropriate: allergies, current medications, past family history, past medical history, past social history, past surgical history and problem list.  Review of Systems Pertinent items noted in HPI and remainder of comprehensive ROS otherwise negative.   Objective:   Blood pressure 102/76, pulse 80, height 5' (1.524 m), weight 140 lb 9.6 oz (63.8 kg). General appearance: alert and no distress Abdomen: soft, non-tender; bowel sounds normal; no masses,  no organomegaly Pelvic: ?The patient's Size 2 pessary was removed, cleaned and replaced without complications. Speculum examination revealed normal vaginal mucosa with no lesions or lacerations  Assessment:   Pessary maintenance Status post hysterectomy with  oophorectomy  Fallopian tube cancer, carcinoma, unspecified laterality (HCC) Incomplete bladder emptying Vaginal atrophy  Cystocele, midline Urinary urgency  Plan:   - Pessary resizing performed today. Will try Size 2 support pessary with knob.  - The patient should return in 7-10 days for a pessary insertion.  - H/o fallopian tube cancer recurrence, s/p chemotherapy, discontinued radation last year.  Only with symptoms of lymphedema at this time.    A total of 15 minutes were spent face-to-face with the patient during this encounter and over half of that time dealt with counseling and coordination of care.   Rubie Maid, MD [facility]

## 2018-08-24 DIAGNOSIS — C774 Secondary and unspecified malignant neoplasm of inguinal and lower limb lymph nodes: Secondary | ICD-10-CM | POA: Diagnosis not present

## 2018-08-24 DIAGNOSIS — I890 Lymphedema, not elsewhere classified: Secondary | ICD-10-CM | POA: Diagnosis not present

## 2018-08-24 DIAGNOSIS — E785 Hyperlipidemia, unspecified: Secondary | ICD-10-CM | POA: Diagnosis not present

## 2018-08-24 DIAGNOSIS — I10 Essential (primary) hypertension: Secondary | ICD-10-CM | POA: Diagnosis not present

## 2018-08-24 DIAGNOSIS — C5701 Malignant neoplasm of right fallopian tube: Secondary | ICD-10-CM | POA: Diagnosis not present

## 2018-08-24 DIAGNOSIS — D630 Anemia in neoplastic disease: Secondary | ICD-10-CM | POA: Diagnosis not present

## 2018-08-28 ENCOUNTER — Encounter: Admitting: Obstetrics and Gynecology

## 2018-09-02 ENCOUNTER — Telehealth: Payer: Self-pay | Admitting: *Deleted

## 2018-09-02 DIAGNOSIS — C5701 Malignant neoplasm of right fallopian tube: Secondary | ICD-10-CM | POA: Diagnosis not present

## 2018-09-02 DIAGNOSIS — D630 Anemia in neoplastic disease: Secondary | ICD-10-CM | POA: Diagnosis not present

## 2018-09-02 DIAGNOSIS — C774 Secondary and unspecified malignant neoplasm of inguinal and lower limb lymph nodes: Secondary | ICD-10-CM | POA: Diagnosis not present

## 2018-09-02 DIAGNOSIS — I10 Essential (primary) hypertension: Secondary | ICD-10-CM | POA: Diagnosis not present

## 2018-09-02 DIAGNOSIS — E785 Hyperlipidemia, unspecified: Secondary | ICD-10-CM | POA: Diagnosis not present

## 2018-09-02 DIAGNOSIS — I890 Lymphedema, not elsewhere classified: Secondary | ICD-10-CM | POA: Diagnosis not present

## 2018-09-02 NOTE — Telephone Encounter (Signed)
Daughter contacted Presenter, broadcasting regarding the dosing of the gabapentin. Pt resides at [facility] [place]. Current dosing at facility is 600 mg in the morning and 200 at bedtime per daughter. Her mom is c/o more neuropathic pain in her hands. She does not know if this is due to lack of mobility and muscle tone. Daughter believes her mom was taking the gabapentin 300 mg up to three times daily instead of the above regimen. Calling to clarify what her dosing should be. She states that she finally convinced her mom to use the inhaler regularly and this has helped the shortness of breath.    I advised daughter to allow the hospice team to evaluate her mother's symptoms. I discussed that her Hospice RN could round on patient w/in the facility and provide any feedback that is needed for mgmt of gabapentin. Daughter expressed appreciation for following up.

## 2018-09-05 DIAGNOSIS — Z86718 Personal history of other venous thrombosis and embolism: Secondary | ICD-10-CM | POA: Diagnosis not present

## 2018-09-05 DIAGNOSIS — I890 Lymphedema, not elsewhere classified: Secondary | ICD-10-CM | POA: Diagnosis not present

## 2018-09-05 DIAGNOSIS — M1990 Unspecified osteoarthritis, unspecified site: Secondary | ICD-10-CM | POA: Diagnosis not present

## 2018-09-05 DIAGNOSIS — Z90722 Acquired absence of ovaries, bilateral: Secondary | ICD-10-CM | POA: Diagnosis not present

## 2018-09-05 DIAGNOSIS — Z741 Need for assistance with personal care: Secondary | ICD-10-CM | POA: Diagnosis not present

## 2018-09-05 DIAGNOSIS — I10 Essential (primary) hypertension: Secondary | ICD-10-CM | POA: Diagnosis not present

## 2018-09-05 DIAGNOSIS — C5701 Malignant neoplasm of right fallopian tube: Secondary | ICD-10-CM | POA: Diagnosis not present

## 2018-09-05 DIAGNOSIS — E785 Hyperlipidemia, unspecified: Secondary | ICD-10-CM | POA: Diagnosis not present

## 2018-09-05 DIAGNOSIS — K219 Gastro-esophageal reflux disease without esophagitis: Secondary | ICD-10-CM | POA: Diagnosis not present

## 2018-09-05 DIAGNOSIS — G909 Disorder of the autonomic nervous system, unspecified: Secondary | ICD-10-CM | POA: Diagnosis not present

## 2018-09-05 DIAGNOSIS — R296 Repeated falls: Secondary | ICD-10-CM | POA: Diagnosis not present

## 2018-09-05 DIAGNOSIS — Z6825 Body mass index (BMI) 25.0-25.9, adult: Secondary | ICD-10-CM | POA: Diagnosis not present

## 2018-09-05 DIAGNOSIS — I059 Rheumatic mitral valve disease, unspecified: Secondary | ICD-10-CM | POA: Diagnosis not present

## 2018-09-05 DIAGNOSIS — Z8673 Personal history of transient ischemic attack (TIA), and cerebral infarction without residual deficits: Secondary | ICD-10-CM | POA: Diagnosis not present

## 2018-09-05 DIAGNOSIS — K900 Celiac disease: Secondary | ICD-10-CM | POA: Diagnosis not present

## 2018-09-05 DIAGNOSIS — D630 Anemia in neoplastic disease: Secondary | ICD-10-CM | POA: Diagnosis not present

## 2018-09-05 DIAGNOSIS — M810 Age-related osteoporosis without current pathological fracture: Secondary | ICD-10-CM | POA: Diagnosis not present

## 2018-09-05 DIAGNOSIS — C774 Secondary and unspecified malignant neoplasm of inguinal and lower limb lymph nodes: Secondary | ICD-10-CM | POA: Diagnosis not present

## 2018-09-05 DIAGNOSIS — Z86711 Personal history of pulmonary embolism: Secondary | ICD-10-CM | POA: Diagnosis not present

## 2018-09-09 DIAGNOSIS — C5701 Malignant neoplasm of right fallopian tube: Secondary | ICD-10-CM | POA: Diagnosis not present

## 2018-09-09 DIAGNOSIS — I890 Lymphedema, not elsewhere classified: Secondary | ICD-10-CM | POA: Diagnosis not present

## 2018-09-09 DIAGNOSIS — C774 Secondary and unspecified malignant neoplasm of inguinal and lower limb lymph nodes: Secondary | ICD-10-CM | POA: Diagnosis not present

## 2018-09-09 DIAGNOSIS — I10 Essential (primary) hypertension: Secondary | ICD-10-CM | POA: Diagnosis not present

## 2018-09-09 DIAGNOSIS — E785 Hyperlipidemia, unspecified: Secondary | ICD-10-CM | POA: Diagnosis not present

## 2018-09-09 DIAGNOSIS — D630 Anemia in neoplastic disease: Secondary | ICD-10-CM | POA: Diagnosis not present

## 2018-09-11 ENCOUNTER — Ambulatory Visit (INDEPENDENT_AMBULATORY_CARE_PROVIDER_SITE_OTHER): Payer: Medicare Other | Admitting: Obstetrics and Gynecology

## 2018-09-11 ENCOUNTER — Other Ambulatory Visit: Payer: Self-pay

## 2018-09-11 ENCOUNTER — Encounter: Payer: Self-pay | Admitting: Obstetrics and Gynecology

## 2018-09-11 VITALS — BP 125/76 | HR 58 | Ht 60.0 in | Wt 139.3 lb

## 2018-09-11 DIAGNOSIS — C5701 Malignant neoplasm of right fallopian tube: Secondary | ICD-10-CM | POA: Diagnosis not present

## 2018-09-11 DIAGNOSIS — D630 Anemia in neoplastic disease: Secondary | ICD-10-CM | POA: Diagnosis not present

## 2018-09-11 DIAGNOSIS — E785 Hyperlipidemia, unspecified: Secondary | ICD-10-CM | POA: Diagnosis not present

## 2018-09-11 DIAGNOSIS — I10 Essential (primary) hypertension: Secondary | ICD-10-CM | POA: Diagnosis not present

## 2018-09-11 DIAGNOSIS — N3945 Continuous leakage: Secondary | ICD-10-CM

## 2018-09-11 DIAGNOSIS — N952 Postmenopausal atrophic vaginitis: Secondary | ICD-10-CM

## 2018-09-11 DIAGNOSIS — C774 Secondary and unspecified malignant neoplasm of inguinal and lower limb lymph nodes: Secondary | ICD-10-CM | POA: Diagnosis not present

## 2018-09-11 DIAGNOSIS — R339 Retention of urine, unspecified: Secondary | ICD-10-CM

## 2018-09-11 DIAGNOSIS — I890 Lymphedema, not elsewhere classified: Secondary | ICD-10-CM | POA: Diagnosis not present

## 2018-09-11 DIAGNOSIS — Z4689 Encounter for fitting and adjustment of other specified devices: Secondary | ICD-10-CM

## 2018-09-11 DIAGNOSIS — N8111 Cystocele, midline: Secondary | ICD-10-CM | POA: Diagnosis not present

## 2018-09-11 MED ORDER — PREMARIN 0.625 MG/GM VA CREA: TOPICAL_CREAM | VAGINAL | 3 refills | Status: AC

## 2018-09-11 NOTE — Progress Notes (Signed)
Pt is present today for pessary insertion. Pt stated that she was doing well no problems.

## 2018-09-11 NOTE — Progress Notes (Signed)
    GYNECOLOGY PROGRESS NOTE  Subjective:    Patient ID: Nicole Clayton, female    DOB: July 10, 1928, 85 y.o.   MRN: [id number]  HPI  Patient is a 85 y.o. G56P3003 female who presents for new pessary insertion.    She has a history of cystocele (Grade 2) with incomplete bladder emptying, history of recurrent UTI's, is s/p remote history of hysterectomy with oophorectomy. She also has been experiencing some continuous leakage (started on Ditropan by her PCP in May). Currently using Size 2 ring pessary with support.    Patient's daughter states that the leaking has become worse. Also notes that patient feels like the pessary is irritating her/rubbing.  They have not continued use of Premarin cream as recommended last visit as they weren't aware they needed to continue after leaving the appointment (although samples for home use were given).    The following portions of the patient's history were reviewed and updated as appropriate: allergies, current medications, past family history, past medical history, past social history, past surgical history and problem list.  Review of Systems Pertinent items noted in HPI and remainder of comprehensive ROS otherwise negative.   Objective:   Blood pressure 125/76, pulse (!) 58, height 5' (1.524 m), weight 139 lb 4.8 oz (63.2 kg). General appearance: alert and no distress Abdomen: soft, non-tender; bowel sounds normal; no masses,  no organomegaly Pelvic: ?The patient's Size 2 pessary was removed, cleaned and given. Speculum examination revealed normal vaginal mucosa with no lesions or lacerations. New Size 2 1/2 pessary with knob placed.   Assessment:   Pessary maintenance Incomplete bladder emptying Continuous leakage Vaginal atrophy  Cystocele, midline  Plan:   - Pessary insertion done today. Patient given used pessary to store.  - Continue Ditropan as prescribed.  - Advised on use of Premarin cream regularly to reduce irritation. Given new samples.  Prescription also given.  - RTC in 3-4 weeks for pessary check.    A total of 15 minutes were spent face-to-face with the patient during this encounter and over half of that time dealt with counseling and coordination of care.   Rubie Maid, MD [facility]

## 2018-09-16 DIAGNOSIS — I10 Essential (primary) hypertension: Secondary | ICD-10-CM | POA: Diagnosis not present

## 2018-09-16 DIAGNOSIS — C774 Secondary and unspecified malignant neoplasm of inguinal and lower limb lymph nodes: Secondary | ICD-10-CM | POA: Diagnosis not present

## 2018-09-16 DIAGNOSIS — I890 Lymphedema, not elsewhere classified: Secondary | ICD-10-CM | POA: Diagnosis not present

## 2018-09-16 DIAGNOSIS — C5701 Malignant neoplasm of right fallopian tube: Secondary | ICD-10-CM | POA: Diagnosis not present

## 2018-09-16 DIAGNOSIS — E785 Hyperlipidemia, unspecified: Secondary | ICD-10-CM | POA: Diagnosis not present

## 2018-09-16 DIAGNOSIS — D630 Anemia in neoplastic disease: Secondary | ICD-10-CM | POA: Diagnosis not present

## 2018-09-23 DIAGNOSIS — C774 Secondary and unspecified malignant neoplasm of inguinal and lower limb lymph nodes: Secondary | ICD-10-CM | POA: Diagnosis not present

## 2018-09-23 DIAGNOSIS — I10 Essential (primary) hypertension: Secondary | ICD-10-CM | POA: Diagnosis not present

## 2018-09-23 DIAGNOSIS — E785 Hyperlipidemia, unspecified: Secondary | ICD-10-CM | POA: Diagnosis not present

## 2018-09-23 DIAGNOSIS — D630 Anemia in neoplastic disease: Secondary | ICD-10-CM | POA: Diagnosis not present

## 2018-09-23 DIAGNOSIS — I890 Lymphedema, not elsewhere classified: Secondary | ICD-10-CM | POA: Diagnosis not present

## 2018-09-23 DIAGNOSIS — C5701 Malignant neoplasm of right fallopian tube: Secondary | ICD-10-CM | POA: Diagnosis not present

## 2018-09-28 DIAGNOSIS — M79672 Pain in left foot: Secondary | ICD-10-CM | POA: Diagnosis not present

## 2018-09-28 DIAGNOSIS — I87303 Chronic venous hypertension (idiopathic) without complications of bilateral lower extremity: Secondary | ICD-10-CM | POA: Diagnosis not present

## 2018-09-28 DIAGNOSIS — M79671 Pain in right foot: Secondary | ICD-10-CM | POA: Diagnosis not present

## 2018-09-28 DIAGNOSIS — B351 Tinea unguium: Secondary | ICD-10-CM | POA: Diagnosis not present

## 2018-09-30 DIAGNOSIS — I890 Lymphedema, not elsewhere classified: Secondary | ICD-10-CM | POA: Diagnosis not present

## 2018-09-30 DIAGNOSIS — I10 Essential (primary) hypertension: Secondary | ICD-10-CM | POA: Diagnosis not present

## 2018-09-30 DIAGNOSIS — C774 Secondary and unspecified malignant neoplasm of inguinal and lower limb lymph nodes: Secondary | ICD-10-CM | POA: Diagnosis not present

## 2018-09-30 DIAGNOSIS — D630 Anemia in neoplastic disease: Secondary | ICD-10-CM | POA: Diagnosis not present

## 2018-09-30 DIAGNOSIS — E785 Hyperlipidemia, unspecified: Secondary | ICD-10-CM | POA: Diagnosis not present

## 2018-09-30 DIAGNOSIS — C5701 Malignant neoplasm of right fallopian tube: Secondary | ICD-10-CM | POA: Diagnosis not present

## 2018-10-02 DIAGNOSIS — I890 Lymphedema, not elsewhere classified: Secondary | ICD-10-CM | POA: Diagnosis not present

## 2018-10-02 DIAGNOSIS — I10 Essential (primary) hypertension: Secondary | ICD-10-CM | POA: Diagnosis not present

## 2018-10-02 DIAGNOSIS — D630 Anemia in neoplastic disease: Secondary | ICD-10-CM | POA: Diagnosis not present

## 2018-10-02 DIAGNOSIS — E785 Hyperlipidemia, unspecified: Secondary | ICD-10-CM | POA: Diagnosis not present

## 2018-10-02 DIAGNOSIS — C774 Secondary and unspecified malignant neoplasm of inguinal and lower limb lymph nodes: Secondary | ICD-10-CM | POA: Diagnosis not present

## 2018-10-02 DIAGNOSIS — C5701 Malignant neoplasm of right fallopian tube: Secondary | ICD-10-CM | POA: Diagnosis not present

## 2018-10-05 DIAGNOSIS — C5701 Malignant neoplasm of right fallopian tube: Secondary | ICD-10-CM | POA: Diagnosis not present

## 2018-10-05 DIAGNOSIS — C774 Secondary and unspecified malignant neoplasm of inguinal and lower limb lymph nodes: Secondary | ICD-10-CM | POA: Diagnosis not present

## 2018-10-05 DIAGNOSIS — I890 Lymphedema, not elsewhere classified: Secondary | ICD-10-CM | POA: Diagnosis not present

## 2018-10-05 DIAGNOSIS — D630 Anemia in neoplastic disease: Secondary | ICD-10-CM | POA: Diagnosis not present

## 2018-10-05 DIAGNOSIS — I10 Essential (primary) hypertension: Secondary | ICD-10-CM | POA: Diagnosis not present

## 2018-10-05 DIAGNOSIS — E785 Hyperlipidemia, unspecified: Secondary | ICD-10-CM | POA: Diagnosis not present

## 2018-10-06 DIAGNOSIS — G909 Disorder of the autonomic nervous system, unspecified: Secondary | ICD-10-CM | POA: Diagnosis not present

## 2018-10-06 DIAGNOSIS — Z86711 Personal history of pulmonary embolism: Secondary | ICD-10-CM | POA: Diagnosis not present

## 2018-10-06 DIAGNOSIS — D630 Anemia in neoplastic disease: Secondary | ICD-10-CM | POA: Diagnosis not present

## 2018-10-06 DIAGNOSIS — M810 Age-related osteoporosis without current pathological fracture: Secondary | ICD-10-CM | POA: Diagnosis not present

## 2018-10-06 DIAGNOSIS — Z86718 Personal history of other venous thrombosis and embolism: Secondary | ICD-10-CM | POA: Diagnosis not present

## 2018-10-06 DIAGNOSIS — J449 Chronic obstructive pulmonary disease, unspecified: Secondary | ICD-10-CM | POA: Diagnosis not present

## 2018-10-06 DIAGNOSIS — Z6825 Body mass index (BMI) 25.0-25.9, adult: Secondary | ICD-10-CM | POA: Diagnosis not present

## 2018-10-06 DIAGNOSIS — I059 Rheumatic mitral valve disease, unspecified: Secondary | ICD-10-CM | POA: Diagnosis not present

## 2018-10-06 DIAGNOSIS — C774 Secondary and unspecified malignant neoplasm of inguinal and lower limb lymph nodes: Secondary | ICD-10-CM | POA: Diagnosis not present

## 2018-10-06 DIAGNOSIS — I10 Essential (primary) hypertension: Secondary | ICD-10-CM | POA: Diagnosis not present

## 2018-10-06 DIAGNOSIS — R296 Repeated falls: Secondary | ICD-10-CM | POA: Diagnosis not present

## 2018-10-06 DIAGNOSIS — M1990 Unspecified osteoarthritis, unspecified site: Secondary | ICD-10-CM | POA: Diagnosis not present

## 2018-10-06 DIAGNOSIS — Z90722 Acquired absence of ovaries, bilateral: Secondary | ICD-10-CM | POA: Diagnosis not present

## 2018-10-06 DIAGNOSIS — E785 Hyperlipidemia, unspecified: Secondary | ICD-10-CM | POA: Diagnosis not present

## 2018-10-06 DIAGNOSIS — Z8673 Personal history of transient ischemic attack (TIA), and cerebral infarction without residual deficits: Secondary | ICD-10-CM | POA: Diagnosis not present

## 2018-10-06 DIAGNOSIS — K219 Gastro-esophageal reflux disease without esophagitis: Secondary | ICD-10-CM | POA: Diagnosis not present

## 2018-10-06 DIAGNOSIS — K900 Celiac disease: Secondary | ICD-10-CM | POA: Diagnosis not present

## 2018-10-06 DIAGNOSIS — C5701 Malignant neoplasm of right fallopian tube: Secondary | ICD-10-CM | POA: Diagnosis not present

## 2018-10-06 DIAGNOSIS — Z741 Need for assistance with personal care: Secondary | ICD-10-CM | POA: Diagnosis not present

## 2018-10-06 DIAGNOSIS — I890 Lymphedema, not elsewhere classified: Secondary | ICD-10-CM | POA: Diagnosis not present

## 2018-10-07 DIAGNOSIS — C774 Secondary and unspecified malignant neoplasm of inguinal and lower limb lymph nodes: Secondary | ICD-10-CM | POA: Diagnosis not present

## 2018-10-07 DIAGNOSIS — I10 Essential (primary) hypertension: Secondary | ICD-10-CM | POA: Diagnosis not present

## 2018-10-07 DIAGNOSIS — E785 Hyperlipidemia, unspecified: Secondary | ICD-10-CM | POA: Diagnosis not present

## 2018-10-07 DIAGNOSIS — C5701 Malignant neoplasm of right fallopian tube: Secondary | ICD-10-CM | POA: Diagnosis not present

## 2018-10-07 DIAGNOSIS — I890 Lymphedema, not elsewhere classified: Secondary | ICD-10-CM | POA: Diagnosis not present

## 2018-10-07 DIAGNOSIS — D630 Anemia in neoplastic disease: Secondary | ICD-10-CM | POA: Diagnosis not present

## 2018-10-14 DIAGNOSIS — C5701 Malignant neoplasm of right fallopian tube: Secondary | ICD-10-CM | POA: Diagnosis not present

## 2018-10-14 DIAGNOSIS — C774 Secondary and unspecified malignant neoplasm of inguinal and lower limb lymph nodes: Secondary | ICD-10-CM | POA: Diagnosis not present

## 2018-10-14 DIAGNOSIS — E785 Hyperlipidemia, unspecified: Secondary | ICD-10-CM | POA: Diagnosis not present

## 2018-10-14 DIAGNOSIS — I10 Essential (primary) hypertension: Secondary | ICD-10-CM | POA: Diagnosis not present

## 2018-10-14 DIAGNOSIS — D630 Anemia in neoplastic disease: Secondary | ICD-10-CM | POA: Diagnosis not present

## 2018-10-14 DIAGNOSIS — I890 Lymphedema, not elsewhere classified: Secondary | ICD-10-CM | POA: Diagnosis not present

## 2018-10-15 ENCOUNTER — Encounter: Payer: Self-pay | Admitting: Obstetrics and Gynecology

## 2018-10-15 ENCOUNTER — Other Ambulatory Visit: Payer: Self-pay

## 2018-10-15 ENCOUNTER — Ambulatory Visit (INDEPENDENT_AMBULATORY_CARE_PROVIDER_SITE_OTHER): Payer: Medicare Other | Admitting: Obstetrics and Gynecology

## 2018-10-15 VITALS — BP 108/74 | HR 60 | Ht 60.0 in | Wt 141.5 lb

## 2018-10-15 DIAGNOSIS — R339 Retention of urine, unspecified: Secondary | ICD-10-CM

## 2018-10-15 DIAGNOSIS — R399 Unspecified symptoms and signs involving the genitourinary system: Secondary | ICD-10-CM | POA: Diagnosis not present

## 2018-10-15 DIAGNOSIS — N8111 Cystocele, midline: Secondary | ICD-10-CM | POA: Diagnosis not present

## 2018-10-15 DIAGNOSIS — R3915 Urgency of urination: Secondary | ICD-10-CM | POA: Diagnosis not present

## 2018-10-15 DIAGNOSIS — Z4689 Encounter for fitting and adjustment of other specified devices: Secondary | ICD-10-CM

## 2018-10-15 DIAGNOSIS — N3945 Continuous leakage: Secondary | ICD-10-CM

## 2018-10-15 DIAGNOSIS — N952 Postmenopausal atrophic vaginitis: Secondary | ICD-10-CM | POA: Diagnosis not present

## 2018-10-15 LAB — POCT URINALYSIS DIPSTICK
Bilirubin, UA: NEGATIVE
Blood, UA: NEGATIVE
Glucose, UA: NEGATIVE
Ketones, UA: NEGATIVE
Leukocytes, UA: NEGATIVE
Nitrite, UA: NEGATIVE
Protein, UA: NEGATIVE
Spec Grav, UA: 1.01 (ref 1.010–1.025)
Urobilinogen, UA: 0.2 E.U./dL
pH, UA: 6 (ref 5.0–8.0)

## 2018-10-15 NOTE — Progress Notes (Signed)
GYNECOLOGY PROGRESS NOTE  Subjective:    Patient ID: Nicole Clayton, female    DOB: 1929/01/12, 85 y.o.   MRN: [id number]  HPI  Patient is a 85 y.o. [id number] female who presents for new pessary check.  She has a history of cystocele (Grade 2) with incomplete bladder emptying, history of recurrent UTI's, is s/p remote history of hysterectomy with oophorectomy. She also has been experiencing some continuous leakage (started on Ditropan by her PCP in May). Replaced her Size 2 ring pessary with support with an incontinence dish last visit.  Patient notes that she is not going to the bathroom as much anymore, but is now worried that she may not be emptying her bladder all the way.  Also complains of some bleeding, unsure if it was from the pessary, or if she was developing a UTI.  Notes that she is on Macrobid suppression for a history of recurrent UTI's. Has been noting some mild back pain and pelvic pressure. Stopped using her Premarin as prescribed due to being concerned that she may be traumatizing the tissue or using it incorrectly.    The following portions of the patient's history were reviewed and updated as appropriate: allergies, current medications, past family history, past medical history, past social history, past surgical history and problem list.  Review of Systems Pertinent items noted in HPI and remainder of comprehensive ROS otherwise negative.   Objective:   Blood pressure 108/74, pulse 60, height 5' (1.524 m), weight 141 lb 8 oz (64.2 kg). General appearance: alert and no distress Abdomen: normal findings: bowel sounds normal, no masses palpable and soft and abnormal findings:  mild tenderness in suprapubic region.  Pelvic: ?The patient's Size 2 1/2 incontinence dish was removed, cleaned and given. Speculum examination revealed normal mildly atrophic vaginal mucosa with no lesions or lacerations.  Urethra did appear slightly hyperemic   Labs:  Results for orders placed or  performed in visit on 10/15/18  POCT urinalysis dipstick  Result Value Ref Range   Color, UA yellow    Clarity, UA clear    Glucose, UA Negative Negative   Bilirubin, UA neg    Ketones, UA neg    Spec Grav, UA 1.010 1.010 - 1.025   Blood, UA neg    pH, UA 6.0 5.0 - 8.0   Protein, UA Negative Negative   Urobilinogen, UA 0.2 0.2 or 1.0 E.U./dL   Nitrite, UA neg    Leukocytes, UA Negative Negative   Appearance yellow    Odor      Assessment:   Pessary maintenance Incomplete bladder emptying Continuous leakage Vaginal atrophy  Cystocele, midline  Plan:   - Pessary check done today.  Advised to return in 3 months for next check. Discussed that overall the new pessary and the medication appear to be helping her leaking.  However discussed that if she still feels that she is not emptying her bladder frequently enough, may need to decrease dose on Ditropan.  - Advised on use of Premarin cream regularly to reduce irritation. Placed cream on urethra today.  - UA negative today for UTI but due to patient's history and recent symptoms, will still send for a culture. She is also currently on Macrobid suppression.  - RTC in 8months for pessary check.    A total of 15 minutes were spent face-to-face with the patient during this encounter and over half of that time dealt with counseling and coordination of care.   Rubie Maid, MD  [facility]

## 2018-10-15 NOTE — Progress Notes (Signed)
Pt is present for pessary check. Pt stated noticing some bleeding with her pessary. Pt is currently not using the premarin cream due to not sure on how to properly use it.

## 2018-10-16 ENCOUNTER — Other Ambulatory Visit: Payer: Self-pay | Admitting: *Deleted

## 2018-10-16 MED ORDER — NITROFURANTOIN MONOHYD MACRO 100 MG PO CAPS: 100.0000 mg | ORAL_CAPSULE | Freq: Every day | ORAL | 1 refills | Status: DC

## 2018-10-17 LAB — URINE CULTURE: Organism ID, Bacteria: NO GROWTH

## 2018-10-21 DIAGNOSIS — C5701 Malignant neoplasm of right fallopian tube: Secondary | ICD-10-CM | POA: Diagnosis not present

## 2018-10-21 DIAGNOSIS — C774 Secondary and unspecified malignant neoplasm of inguinal and lower limb lymph nodes: Secondary | ICD-10-CM | POA: Diagnosis not present

## 2018-10-21 DIAGNOSIS — I10 Essential (primary) hypertension: Secondary | ICD-10-CM | POA: Diagnosis not present

## 2018-10-21 DIAGNOSIS — E785 Hyperlipidemia, unspecified: Secondary | ICD-10-CM | POA: Diagnosis not present

## 2018-10-21 DIAGNOSIS — I890 Lymphedema, not elsewhere classified: Secondary | ICD-10-CM | POA: Diagnosis not present

## 2018-10-21 DIAGNOSIS — D630 Anemia in neoplastic disease: Secondary | ICD-10-CM | POA: Diagnosis not present

## 2018-10-22 ENCOUNTER — Other Ambulatory Visit: Payer: Self-pay

## 2018-10-22 ENCOUNTER — Inpatient Hospital Stay: Attending: Nurse Practitioner | Admitting: Nurse Practitioner

## 2018-10-22 VITALS — BP 123/68 | HR 63 | Temp 97.0°F | Resp 18

## 2018-10-22 DIAGNOSIS — B372 Candidiasis of skin and nail: Secondary | ICD-10-CM | POA: Insufficient documentation

## 2018-10-22 DIAGNOSIS — G8929 Other chronic pain: Secondary | ICD-10-CM | POA: Diagnosis not present

## 2018-10-22 DIAGNOSIS — L304 Erythema intertrigo: Secondary | ICD-10-CM | POA: Insufficient documentation

## 2018-10-22 DIAGNOSIS — C5701 Malignant neoplasm of right fallopian tube: Secondary | ICD-10-CM | POA: Diagnosis not present

## 2018-10-22 DIAGNOSIS — Z86711 Personal history of pulmonary embolism: Secondary | ICD-10-CM | POA: Diagnosis not present

## 2018-10-22 MED ORDER — NYSTATIN 100000 UNIT/GM EX POWD: Freq: Four times a day (QID) | CUTANEOUS | 2 refills | Status: DC

## 2018-10-22 NOTE — Progress Notes (Signed)
[facility]  Telephone:([phone] [phone]  Patient Care Team: Ezequiel Kayser, [name]   Name of the patient: Nicole Clayton  [id number]  [birth year]   Date of visit: 10/22/18  Diagnosis-metastatic adenocarcinoma of the fallopian tube  Chief complaint/ Reason for visit-vaginal bleeding  Heme/Onc history:  Oncology History Overview Note  # 12/2009- Adenocarcinoma of the fallopian tube, stage IIIC (large peri-aortic node, omentum), grade 3.  Adequate TRS, no macroscopic residual. IP/IV chemotherapy with DDP and paclitaxel on GOG protocol, chemo and Bev consolidation completed in 03/2011 2. 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 November 14, 2011- Patient was started on carboplatin and gemcitabine 4. Finished 6 cycles of chemotherapy in April of 2014 with carboplatin and gemcitabine. Tolerance was fairly good except for neutropenia and thrombocytopenia. 5.recurrent disease by CT scan February of 2015  # .radiation therapy to pelvis  May of 2015  7.upper extremity  . and deep vein thrombosis associated with port.(June of 2015) patient started on   [place] had port removed and had   thrombectomy September 06, 2013. # CT DEC 2016- progressing disease in the right inguinal area # FEB-TAXOL-AVASTIN;  # FEB 2017- AVASTIN q 3W; July 2017- CT- Improved  Right Inguinal LN; STOP AVASTIN sec to potential concerns of AEs  # AUG 2017 3rd- START ZEJULA- declines sec to intol  # OCT 7th CT- STABLE RIGHT INGUINAL LN- RE-START AVASTIN q 3W [STOPPED oct 25th 2018]; Avastin discontinued October 2018 [stroke/PE]  # MAY 2019- Right inguinal adenopathy progression status post radiation [June 2019]   #TIA x2 [name] 2019 last]; PE [[facility]]; right lower extremity lymphedema-radiation/malignancy  ------------------------------------------------ MOLECULAR TESTING: somatic BRCA negative;  F-ONE: No targets*   Dx: SEP 2013  fallopian tube/platinum sensitive high-grade serous-cancer  Current/most recent therapy-surveillance.  Goals: palliative; stage: Metastatic/Recurrent - IV       Malignant neoplasm of right fallopian tube [facility])    Interval history- Nicole Clayton, 85 year old female with history of metastatic fallopian tube cancer, currently on hospice, presents to symptom management clinic for concerns of possible vaginal bleeding.  She reports blood/brown fluid on her depends when she wakes in the morning.  Does not notice any discharge/drainage during the day.  Says it is a very small amount.  She was seen by Dr. Marcelline Mates last week for pessary maintenance and no reports of vaginal bleeding.  Says she is noticed an associated odor.She's unsure where drainage is coming from but doesn't believe it is vaginal or rectal. She is followed by hospice at the facility where she resides.  She describes streaks to small amounts; not saturating, amounts of drainage. She has chronic pelvic pain and chronic nausea but does not take medication for these symptoms.  She is accompanied by her daughter who contributes to history.  ECOG FS:2 - Symptomatic, <50% confined to bed  Review of systems- Review of Systems  Constitutional: Negative for chills, fever, malaise/fatigue and weight loss.  Respiratory: Negative for cough and shortness of breath.   Cardiovascular: Negative for chest pain and palpitations.  Gastrointestinal: Positive for nausea. Negative for abdominal pain, blood in stool, constipation, diarrhea, melena and vomiting.  Genitourinary:       Pelvic discomfort  Musculoskeletal: Negative for falls.  Neurological: Negative for loss of consciousness, weakness and headaches.  Psychiatric/Behavioral: Negative for depression and memory loss. The patient is not nervous/anxious.      Current treatment- hospice  Allergies  Allergen Reactions  . Benadryl [Diphenhydramine] Shortness Of  Breath    Sob, rash, swelling  . Tamsulosin Swelling  . Alendronate Sodium     Other reaction(s): Other (See Comments) Dysphagia  . Duloxetine Diarrhea  . Duloxetine Hcl Diarrhea  . Risedronate Sodium Rash    Aching, dysphagia  . Lovastatin     Other reaction(s): Other (See Comments) GI upset  . Sulfa Antibiotics Rash    States she can take but leave as allergy    Past Medical History:  Diagnosis Date  . Bladder infection, chronic 10/19/2011  . Cancer (HCC)    Ovarian   . Carcinoma of fallopian tube ([place]) 10/05/2009   Overview:  Overview:  Overview:  Drs. Claiborne Rigg and Delorise Shiner Choksi Overview:  Drs. Claiborne Rigg and [employer]   . Concussion   . GERD (gastroesophageal reflux disease)   . MI (mitral incompetence) 10/27/2014   Overview:  MODERATE   . Mitral valve prolapse   . OAB (overactive bladder)   . Ovarian cancer ([place])   . Pulmonary embolism ([place])   . Tachycardia     Past Surgical History:  Procedure Laterality Date  . ABDOMINAL HYSTERECTOMY    . CHOLECYSTECTOMY    . LAPAROSCOPIC BILATERAL SALPINGO OOPHERECTOMY  2011  . ovarian cancer surgery  2011    Social History   Socioeconomic History  . Marital status: Married    Spouse name: Not on file  . Number of children: Not on file  . Years of education: Not on file  . Highest education level: Not on file  Occupational History  . Not on file  Social Needs  . Financial resource strain: Not on file  . Food insecurity    Worry: Not on file    Inability: Not on file  . Transportation needs    Medical: Not on file    Non-medical: Not on file  Tobacco Use  . Smoking status: Never Smoker  . Smokeless tobacco: Never Used  Substance and Sexual Activity  . Alcohol use: No  . Drug use: No  . Sexual activity: Not Currently    Birth control/protection: Surgical  Lifestyle  . Physical activity    Days per week: Not on file    Minutes per session: Not on file  . Stress: Not on file  Relationships  .  Social Herbalist on phone: Not on file    Gets together: Not on file    Attends religious service: Not on file    Active member of club or organization: Not on file    Attends meetings of clubs or organizations: Not on file    Relationship status: Not on file  . Intimate partner violence    Fear of current or ex partner: Not on file    Emotionally abused: Not on file    Physically abused: Not on file    Forced sexual activity: Not on file  Other Topics Concern  . Not on file  Social History Narrative  . Not on file    Family History  Problem Relation Age of Onset  . Ovarian cancer Other   . Breast cancer Neg Hx   . Colon cancer Neg Hx   . Diabetes Neg Hx   . Heart disease Neg Hx   . Kidney disease Neg Hx   . Bladder Cancer Neg Hx      Current Outpatient Medications:  .  acetaminophen (TYLENOL) 650 MG CR tablet, Take 650 mg  by mouth 2 (two) times daily as needed for pain. , Disp: , Rfl:  .  albuterol (PROVENTIL HFA;VENTOLIN HFA) 108 (90 Base) MCG/ACT inhaler, Inhale 2 puffs into the lungs every 6 (six) hours as needed for wheezing or shortness of breath., Disp: 1 Inhaler, Rfl: 2 .  calcium carbonate (TUMS - DOSED IN MG ELEMENTAL CALCIUM) 500 MG chewable tablet, Chew 2 tablets by mouth every 2 (two) hours as needed for indigestion or heartburn., Disp: , Rfl:  .  cetirizine (ZYRTEC) 10 MG tablet, Take 10 mg by mouth daily. , Disp: , Rfl:  .  cholecalciferol (VITAMIN D) 1000 units tablet, Take 1,000 Units by mouth daily. , Disp: , Rfl:  .  docusate sodium (COLACE) 100 MG capsule, Take 100 mg by mouth daily as needed for mild constipation. , Disp: , Rfl:  .  fluticasone (FLONASE) 50 MCG/ACT nasal spray, Place 2 sprays into both nostrils daily. , Disp: , Rfl:  .  furosemide (LASIX) 20 MG tablet, Take 1 tablet (20 mg total) by mouth daily., Disp: 7 tablet, Rfl: 0 .  gabapentin (NEURONTIN) 300 MG capsule, TAKE 1 CAPSULE BY MOUTH AT BEDTIME, Disp: 28 capsule, Rfl: 11 .   gabapentin (NEURONTIN) 600 MG tablet, Take 600 mg by mouth every morning., Disp: , Rfl:  .  metoprolol succinate (TOPROL-XL) 50 MG 24 hr tablet, Take 50 mg by mouth daily. , Disp: , Rfl:  .  Multiple Vitamin (MULTIVITAMIN WITH MINERALS) TABS tablet, Take 1 tablet by mouth daily., Disp: , Rfl:  .  nitrofurantoin, macrocrystal-monohydrate, (MACROBID) 100 MG capsule, Take 1 capsule (100 mg total) by mouth daily., Disp: 30 capsule, Rfl: 1 .  oxybutynin (DITROPAN XL) 10 MG 24 hr tablet, Take 1 tablet (10 mg total) by mouth at bedtime., Disp: 30 tablet, Rfl: 4 .  pantoprazole (PROTONIX) 40 MG tablet, Take 40 mg by mouth daily. , Disp: , Rfl:  .  acetaminophen-codeine (TYLENOL #3) 300-30 MG tablet, Take 1 tablet by mouth every 4 (four) hours as needed for moderate pain or severe pain. Not to exceed 4000 mg of acetaminophen in 24 hours. (Patient not taking: Reported on 10/22/2018), Disp: 30 tablet, Rfl: 0 .  benzonatate (TESSALON) 200 MG capsule, Take 1 capsule by mouth 3 (three) times daily., Disp: , Rfl:  .  chlorpheniramine-HYDROcodone (TUSSIONEX) 10-8 MG/5ML SUER, Take 5 mLs by mouth at bedtime as needed for cough. (Patient not taking: Reported on 10/22/2018), Disp: 140 mL, Rfl: 0 .  conjugated estrogens (PREMARIN) vaginal cream, Apply 0.5 grams twice weekly (with applicator if needed). (Patient not taking: Reported on 10/15/2018), Disp: 42.5 g, Rfl: 3 .  loperamide (IMODIUM) 2 MG capsule, Take 2 mg by mouth as needed for diarrhea or loose stools. Do not exceed 4 capsules in a 24 hour period., Disp: , Rfl:  .  ondansetron (ZOFRAN) 4 MG tablet, Take 4 mg by mouth every 8 (eight) hours as needed for nausea or vomiting., Disp: , Rfl:  No current facility-administered medications for this visit.   Facility-Administered Medications Ordered in Other Visits:  .  diphenhydrAMINE (BENADRYL) injection 50 mg, 50 mg, Intravenous, Once, Choksi, Janak, MD .  sodium chloride 0.9 % injection 10 mL, 10 mL, Intravenous, PRN,  Choksi, Janak, MD, 10 mL at 01/12/15 1116 .  sodium chloride 0.9 % injection 10 mL, 10 mL, Intravenous, PRN, Choksi, Janak, MD, 10 mL at 02/16/15 1025  Physical exam:  Vitals:   10/22/18 1438  BP: 123/68  Pulse: 63  Resp: 18  Temp: (!) 97 F (36.1 C)  TempSrc: Tympanic   Physical Exam Constitutional:      General: She is not in acute distress.    Comments: Accompanied by daughter  Eyes:     General: No scleral icterus.    Conjunctiva/sclera: Conjunctivae normal.  Abdominal:     General: Abdomen is flat. There is no distension.     Comments: Pelvic firm; mildly ttp  Genitourinary:    Comments: Patient declined vaginal and/or rectal exam Musculoskeletal:     Right lower leg: Edema present.     Left lower leg: Edema present.  Lymphadenopathy:     Comments: Enlarged right pelvic lymph nodes with hard, pink/purple thinned skin overlying; presumed metastatic disease nearly eroding through skin. Pinpoint areas of drainage of thin, brown/yellow fluid.   Skin:    General: Skin is warm and dry.  Neurological:     Mental Status: She is alert and oriented to person, place, and time.     Comments: Slow gait; ambulates w/ 4 wheel rolling walker  Psychiatric:        Mood and Affect: Mood normal.        Behavior: Behavior normal.      CMP Latest Ref Rng & Units 03/18/2018  Glucose 70 - 99 mg/dL 106(H)  BUN 8 - 23 mg/dL 15  Creatinine 0.44 - 1.00 mg/dL 0.83  Sodium 135 - 145 mmol/L 136  Potassium 3.5 - 5.1 mmol/L 4.1  Chloride 98 - 111 mmol/L 104  CO2 22 - 32 mmol/L 26  Calcium 8.9 - 10.3 mg/dL 8.6(L)  Total Protein 6.5 - 8.1 g/dL 6.9  Total Bilirubin 0.3 - 1.2 mg/dL 0.5  Alkaline Phos 38 - 126 U/L 66  AST 15 - 41 U/L 19  ALT 0 - 44 U/L 10   CBC Latest Ref Rng & Units 03/18/2018  WBC 4.0 - 10.5 K/uL 5.6  Hemoglobin 12.0 - 15.0 g/dL 11.8(L)  Hematocrit 36.0 - 46.0 % 36.7  Platelets 150 - 400 K/uL 395    No images are attached to the encounter.  No results found.   Assessment and Plan- Patient is a 85 y.o. female diagnosed with metastatic adenocarcinoma of the fallopian tube, who presents to symptom management clinic for wound secondary to cancer.  1.  Inguinal lymph node metastases-suspect drainage coming from metastatic disease of lymph node which has pinpoint areas of drainage. She wishes to continue hospice care and elects for palliative management. Start dressing area with gauze or abd pad overlying at night to protect underwear/depends from drainage. If area opens into erosive wound, could re-evaluate dressing or treat secondary infections. Discussed that symptoms likely reflective of progressive metastatic cancer. Recommend continuing best supportive care with hospice. Patient and family agreeable with this.   2. Intertriginous Dermatitis Secondary to Moisture- area of redness consistent with candidiasis in right inguinal skin fold above lymph node. Start nystatin powder to area and keep clean and dry.   Disposition:  - return to clinic as needed  - Continue hospice services for supportive care   Visit Diagnosis 1. Malignant neoplasm of right fallopian tube (HCC)   2. Intertriginous dermatitis associated with moisture     Patient expressed understanding and was in agreement with this plan. She also understands that She can call clinic at any time with any questions, concerns, or complaints.   Thank you for allowing me to participate in the care of this very pleasant patient.   Beckey Rutter, DNP, AGNP-[facility] [phone] (work cell) [phone] (office)  CC: Rogue Bussing

## 2018-10-23 DIAGNOSIS — E785 Hyperlipidemia, unspecified: Secondary | ICD-10-CM | POA: Diagnosis not present

## 2018-10-23 DIAGNOSIS — C774 Secondary and unspecified malignant neoplasm of inguinal and lower limb lymph nodes: Secondary | ICD-10-CM | POA: Diagnosis not present

## 2018-10-23 DIAGNOSIS — I10 Essential (primary) hypertension: Secondary | ICD-10-CM | POA: Diagnosis not present

## 2018-10-23 DIAGNOSIS — D630 Anemia in neoplastic disease: Secondary | ICD-10-CM | POA: Diagnosis not present

## 2018-10-23 DIAGNOSIS — C5701 Malignant neoplasm of right fallopian tube: Secondary | ICD-10-CM | POA: Diagnosis not present

## 2018-10-23 DIAGNOSIS — I890 Lymphedema, not elsewhere classified: Secondary | ICD-10-CM | POA: Diagnosis not present

## 2018-10-27 ENCOUNTER — Ambulatory Visit: Payer: Medicare HMO | Admitting: Internal Medicine

## 2018-10-27 DIAGNOSIS — E7849 Other hyperlipidemia: Secondary | ICD-10-CM | POA: Diagnosis not present

## 2018-10-27 DIAGNOSIS — K900 Celiac disease: Secondary | ICD-10-CM | POA: Diagnosis not present

## 2018-10-27 DIAGNOSIS — I341 Nonrheumatic mitral (valve) prolapse: Secondary | ICD-10-CM | POA: Diagnosis not present

## 2018-10-27 DIAGNOSIS — D649 Anemia, unspecified: Secondary | ICD-10-CM | POA: Diagnosis not present

## 2018-10-27 DIAGNOSIS — C569 Malignant neoplasm of unspecified ovary: Secondary | ICD-10-CM | POA: Diagnosis not present

## 2018-10-27 DIAGNOSIS — K219 Gastro-esophageal reflux disease without esophagitis: Secondary | ICD-10-CM | POA: Diagnosis not present

## 2018-10-27 DIAGNOSIS — Z515 Encounter for palliative care: Secondary | ICD-10-CM | POA: Diagnosis not present

## 2018-10-27 DIAGNOSIS — Z8673 Personal history of transient ischemic attack (TIA), and cerebral infarction without residual deficits: Secondary | ICD-10-CM | POA: Diagnosis not present

## 2018-10-27 DIAGNOSIS — Z86711 Personal history of pulmonary embolism: Secondary | ICD-10-CM | POA: Diagnosis not present

## 2018-10-27 DIAGNOSIS — I10 Essential (primary) hypertension: Secondary | ICD-10-CM | POA: Diagnosis not present

## 2018-10-27 DIAGNOSIS — M1990 Unspecified osteoarthritis, unspecified site: Secondary | ICD-10-CM | POA: Diagnosis not present

## 2018-10-27 DIAGNOSIS — M818 Other osteoporosis without current pathological fracture: Secondary | ICD-10-CM | POA: Diagnosis not present

## 2018-10-27 DIAGNOSIS — G9009 Other idiopathic peripheral autonomic neuropathy: Secondary | ICD-10-CM | POA: Diagnosis not present

## 2018-10-28 DIAGNOSIS — I341 Nonrheumatic mitral (valve) prolapse: Secondary | ICD-10-CM | POA: Diagnosis not present

## 2018-10-28 DIAGNOSIS — E7849 Other hyperlipidemia: Secondary | ICD-10-CM | POA: Diagnosis not present

## 2018-10-28 DIAGNOSIS — I10 Essential (primary) hypertension: Secondary | ICD-10-CM | POA: Diagnosis not present

## 2018-10-28 DIAGNOSIS — D649 Anemia, unspecified: Secondary | ICD-10-CM | POA: Diagnosis not present

## 2018-10-28 DIAGNOSIS — C569 Malignant neoplasm of unspecified ovary: Secondary | ICD-10-CM | POA: Diagnosis not present

## 2018-10-28 DIAGNOSIS — G9009 Other idiopathic peripheral autonomic neuropathy: Secondary | ICD-10-CM | POA: Diagnosis not present

## 2018-11-03 ENCOUNTER — Other Ambulatory Visit: Payer: Self-pay | Admitting: Nurse Practitioner

## 2018-11-03 DIAGNOSIS — L304 Erythema intertrigo: Secondary | ICD-10-CM

## 2018-11-04 DIAGNOSIS — I10 Essential (primary) hypertension: Secondary | ICD-10-CM | POA: Diagnosis not present

## 2018-11-04 DIAGNOSIS — E7849 Other hyperlipidemia: Secondary | ICD-10-CM | POA: Diagnosis not present

## 2018-11-04 DIAGNOSIS — I341 Nonrheumatic mitral (valve) prolapse: Secondary | ICD-10-CM | POA: Diagnosis not present

## 2018-11-04 DIAGNOSIS — G9009 Other idiopathic peripheral autonomic neuropathy: Secondary | ICD-10-CM | POA: Diagnosis not present

## 2018-11-04 DIAGNOSIS — D649 Anemia, unspecified: Secondary | ICD-10-CM | POA: Diagnosis not present

## 2018-11-04 DIAGNOSIS — C569 Malignant neoplasm of unspecified ovary: Secondary | ICD-10-CM | POA: Diagnosis not present

## 2018-11-05 DIAGNOSIS — E7849 Other hyperlipidemia: Secondary | ICD-10-CM | POA: Diagnosis not present

## 2018-11-05 DIAGNOSIS — I341 Nonrheumatic mitral (valve) prolapse: Secondary | ICD-10-CM | POA: Diagnosis not present

## 2018-11-05 DIAGNOSIS — K219 Gastro-esophageal reflux disease without esophagitis: Secondary | ICD-10-CM | POA: Diagnosis not present

## 2018-11-05 DIAGNOSIS — Z515 Encounter for palliative care: Secondary | ICD-10-CM | POA: Diagnosis not present

## 2018-11-05 DIAGNOSIS — D649 Anemia, unspecified: Secondary | ICD-10-CM | POA: Diagnosis not present

## 2018-11-05 DIAGNOSIS — I10 Essential (primary) hypertension: Secondary | ICD-10-CM | POA: Diagnosis not present

## 2018-11-05 DIAGNOSIS — M818 Other osteoporosis without current pathological fracture: Secondary | ICD-10-CM | POA: Diagnosis not present

## 2018-11-05 DIAGNOSIS — G9009 Other idiopathic peripheral autonomic neuropathy: Secondary | ICD-10-CM | POA: Diagnosis not present

## 2018-11-05 DIAGNOSIS — Z86711 Personal history of pulmonary embolism: Secondary | ICD-10-CM | POA: Diagnosis not present

## 2018-11-05 DIAGNOSIS — C569 Malignant neoplasm of unspecified ovary: Secondary | ICD-10-CM | POA: Diagnosis not present

## 2018-11-05 DIAGNOSIS — Z8673 Personal history of transient ischemic attack (TIA), and cerebral infarction without residual deficits: Secondary | ICD-10-CM | POA: Diagnosis not present

## 2018-11-05 DIAGNOSIS — M1990 Unspecified osteoarthritis, unspecified site: Secondary | ICD-10-CM | POA: Diagnosis not present

## 2018-11-05 DIAGNOSIS — K900 Celiac disease: Secondary | ICD-10-CM | POA: Diagnosis not present

## 2018-11-19 ENCOUNTER — Other Ambulatory Visit: Payer: Self-pay | Admitting: Internal Medicine

## 2018-11-19 DIAGNOSIS — D649 Anemia, unspecified: Secondary | ICD-10-CM | POA: Diagnosis not present

## 2018-11-19 DIAGNOSIS — C569 Malignant neoplasm of unspecified ovary: Secondary | ICD-10-CM | POA: Diagnosis not present

## 2018-11-19 DIAGNOSIS — I341 Nonrheumatic mitral (valve) prolapse: Secondary | ICD-10-CM | POA: Diagnosis not present

## 2018-11-19 DIAGNOSIS — I10 Essential (primary) hypertension: Secondary | ICD-10-CM | POA: Diagnosis not present

## 2018-11-19 DIAGNOSIS — G9009 Other idiopathic peripheral autonomic neuropathy: Secondary | ICD-10-CM | POA: Diagnosis not present

## 2018-11-19 DIAGNOSIS — E7849 Other hyperlipidemia: Secondary | ICD-10-CM | POA: Diagnosis not present

## 2018-11-24 DIAGNOSIS — N183 Chronic kidney disease, stage 3 unspecified: Secondary | ICD-10-CM | POA: Diagnosis not present

## 2018-11-24 DIAGNOSIS — N1831 Chronic kidney disease, stage 3a: Secondary | ICD-10-CM | POA: Diagnosis not present

## 2018-11-24 DIAGNOSIS — E1122 Type 2 diabetes mellitus with diabetic chronic kidney disease: Secondary | ICD-10-CM | POA: Diagnosis not present

## 2018-11-24 DIAGNOSIS — E538 Deficiency of other specified B group vitamins: Secondary | ICD-10-CM | POA: Diagnosis not present

## 2018-11-24 DIAGNOSIS — Z23 Encounter for immunization: Secondary | ICD-10-CM | POA: Diagnosis not present

## 2018-11-24 DIAGNOSIS — Z0000 Encounter for general adult medical examination without abnormal findings: Secondary | ICD-10-CM | POA: Diagnosis not present

## 2018-11-24 DIAGNOSIS — Z79899 Other long term (current) drug therapy: Secondary | ICD-10-CM | POA: Diagnosis not present

## 2018-11-24 DIAGNOSIS — E1121 Type 2 diabetes mellitus with diabetic nephropathy: Secondary | ICD-10-CM | POA: Diagnosis not present

## 2018-11-24 DIAGNOSIS — E559 Vitamin D deficiency, unspecified: Secondary | ICD-10-CM | POA: Diagnosis not present

## 2018-11-24 DIAGNOSIS — Z1331 Encounter for screening for depression: Secondary | ICD-10-CM | POA: Diagnosis not present

## 2018-11-25 DIAGNOSIS — D649 Anemia, unspecified: Secondary | ICD-10-CM | POA: Diagnosis not present

## 2018-11-25 DIAGNOSIS — G9009 Other idiopathic peripheral autonomic neuropathy: Secondary | ICD-10-CM | POA: Diagnosis not present

## 2018-11-25 DIAGNOSIS — I341 Nonrheumatic mitral (valve) prolapse: Secondary | ICD-10-CM | POA: Diagnosis not present

## 2018-11-25 DIAGNOSIS — E7849 Other hyperlipidemia: Secondary | ICD-10-CM | POA: Diagnosis not present

## 2018-11-25 DIAGNOSIS — C569 Malignant neoplasm of unspecified ovary: Secondary | ICD-10-CM | POA: Diagnosis not present

## 2018-11-25 DIAGNOSIS — I10 Essential (primary) hypertension: Secondary | ICD-10-CM | POA: Diagnosis not present

## 2018-11-26 DIAGNOSIS — I341 Nonrheumatic mitral (valve) prolapse: Secondary | ICD-10-CM | POA: Diagnosis not present

## 2018-11-26 DIAGNOSIS — G9009 Other idiopathic peripheral autonomic neuropathy: Secondary | ICD-10-CM | POA: Diagnosis not present

## 2018-11-26 DIAGNOSIS — C569 Malignant neoplasm of unspecified ovary: Secondary | ICD-10-CM | POA: Diagnosis not present

## 2018-11-26 DIAGNOSIS — D649 Anemia, unspecified: Secondary | ICD-10-CM | POA: Diagnosis not present

## 2018-11-26 DIAGNOSIS — E7849 Other hyperlipidemia: Secondary | ICD-10-CM | POA: Diagnosis not present

## 2018-11-26 DIAGNOSIS — I10 Essential (primary) hypertension: Secondary | ICD-10-CM | POA: Diagnosis not present

## 2018-11-29 DIAGNOSIS — E7849 Other hyperlipidemia: Secondary | ICD-10-CM | POA: Diagnosis not present

## 2018-11-29 DIAGNOSIS — G9009 Other idiopathic peripheral autonomic neuropathy: Secondary | ICD-10-CM | POA: Diagnosis not present

## 2018-11-29 DIAGNOSIS — I10 Essential (primary) hypertension: Secondary | ICD-10-CM | POA: Diagnosis not present

## 2018-11-29 DIAGNOSIS — C569 Malignant neoplasm of unspecified ovary: Secondary | ICD-10-CM | POA: Diagnosis not present

## 2018-11-29 DIAGNOSIS — D649 Anemia, unspecified: Secondary | ICD-10-CM | POA: Diagnosis not present

## 2018-11-29 DIAGNOSIS — I341 Nonrheumatic mitral (valve) prolapse: Secondary | ICD-10-CM | POA: Diagnosis not present

## 2018-12-02 DIAGNOSIS — D649 Anemia, unspecified: Secondary | ICD-10-CM | POA: Diagnosis not present

## 2018-12-02 DIAGNOSIS — I341 Nonrheumatic mitral (valve) prolapse: Secondary | ICD-10-CM | POA: Diagnosis not present

## 2018-12-02 DIAGNOSIS — G9009 Other idiopathic peripheral autonomic neuropathy: Secondary | ICD-10-CM | POA: Diagnosis not present

## 2018-12-02 DIAGNOSIS — I10 Essential (primary) hypertension: Secondary | ICD-10-CM | POA: Diagnosis not present

## 2018-12-02 DIAGNOSIS — C569 Malignant neoplasm of unspecified ovary: Secondary | ICD-10-CM | POA: Diagnosis not present

## 2018-12-02 DIAGNOSIS — E7849 Other hyperlipidemia: Secondary | ICD-10-CM | POA: Diagnosis not present

## 2019-01-14 ENCOUNTER — Encounter: Payer: Medicare HMO | Admitting: Obstetrics and Gynecology

## 2019-01-23 ENCOUNTER — Inpatient Hospital Stay
Admission: EM | Admit: 2019-01-23 | Discharge: 2019-01-28 | DRG: 693 | Disposition: A | Discharge status: Hospice | Payer: Medicare Other | Source: Skilled Nursing Facility | Attending: Internal Medicine | Admitting: Internal Medicine

## 2019-01-23 ENCOUNTER — Other Ambulatory Visit: Payer: Self-pay

## 2019-01-23 ENCOUNTER — Emergency Department: Payer: Medicare Other

## 2019-01-23 ENCOUNTER — Encounter: Payer: Self-pay | Admitting: Emergency Medicine

## 2019-01-23 DIAGNOSIS — N139 Obstructive and reflux uropathy, unspecified: Secondary | ICD-10-CM | POA: Diagnosis present

## 2019-01-23 DIAGNOSIS — R0902 Hypoxemia: Secondary | ICD-10-CM | POA: Diagnosis not present

## 2019-01-23 DIAGNOSIS — Z79899 Other long term (current) drug therapy: Secondary | ICD-10-CM

## 2019-01-23 DIAGNOSIS — R278 Other lack of coordination: Secondary | ICD-10-CM | POA: Diagnosis present

## 2019-01-23 DIAGNOSIS — E860 Dehydration: Secondary | ICD-10-CM | POA: Diagnosis not present

## 2019-01-23 DIAGNOSIS — K449 Diaphragmatic hernia without obstruction or gangrene: Secondary | ICD-10-CM | POA: Diagnosis present

## 2019-01-23 DIAGNOSIS — I341 Nonrheumatic mitral (valve) prolapse: Secondary | ICD-10-CM | POA: Diagnosis present

## 2019-01-23 DIAGNOSIS — Z882 Allergy status to sulfonamides status: Secondary | ICD-10-CM

## 2019-01-23 DIAGNOSIS — E162 Hypoglycemia, unspecified: Secondary | ICD-10-CM | POA: Diagnosis not present

## 2019-01-23 DIAGNOSIS — Y92009 Unspecified place in unspecified non-institutional (private) residence as the place of occurrence of the external cause: Secondary | ICD-10-CM

## 2019-01-23 DIAGNOSIS — C787 Secondary malignant neoplasm of liver and intrahepatic bile duct: Secondary | ICD-10-CM | POA: Diagnosis present

## 2019-01-23 DIAGNOSIS — Z86711 Personal history of pulmonary embolism: Secondary | ICD-10-CM

## 2019-01-23 DIAGNOSIS — Z9049 Acquired absence of other specified parts of digestive tract: Secondary | ICD-10-CM

## 2019-01-23 DIAGNOSIS — M5489 Other dorsalgia: Secondary | ICD-10-CM | POA: Diagnosis not present

## 2019-01-23 DIAGNOSIS — K5730 Diverticulosis of large intestine without perforation or abscess without bleeding: Secondary | ICD-10-CM | POA: Diagnosis present

## 2019-01-23 DIAGNOSIS — J189 Pneumonia, unspecified organism: Secondary | ICD-10-CM | POA: Diagnosis present

## 2019-01-23 DIAGNOSIS — N3281 Overactive bladder: Secondary | ICD-10-CM | POA: Diagnosis present

## 2019-01-23 DIAGNOSIS — Z66 Do not resuscitate: Secondary | ICD-10-CM | POA: Diagnosis present

## 2019-01-23 DIAGNOSIS — I340 Nonrheumatic mitral (valve) insufficiency: Secondary | ICD-10-CM | POA: Diagnosis present

## 2019-01-23 DIAGNOSIS — R0602 Shortness of breath: Secondary | ICD-10-CM

## 2019-01-23 DIAGNOSIS — Z8041 Family history of malignant neoplasm of ovary: Secondary | ICD-10-CM

## 2019-01-23 DIAGNOSIS — I5033 Acute on chronic diastolic (congestive) heart failure: Secondary | ICD-10-CM | POA: Diagnosis present

## 2019-01-23 DIAGNOSIS — Z20822 Contact with and (suspected) exposure to covid-19: Secondary | ICD-10-CM

## 2019-01-23 DIAGNOSIS — N1330 Unspecified hydronephrosis: Secondary | ICD-10-CM

## 2019-01-23 DIAGNOSIS — Z87440 Personal history of urinary (tract) infections: Secondary | ICD-10-CM

## 2019-01-23 DIAGNOSIS — Z90722 Acquired absence of ovaries, bilateral: Secondary | ICD-10-CM

## 2019-01-23 DIAGNOSIS — Z888 Allergy status to other drugs, medicaments and biological substances status: Secondary | ICD-10-CM

## 2019-01-23 DIAGNOSIS — E161 Other hypoglycemia: Secondary | ICD-10-CM | POA: Diagnosis not present

## 2019-01-23 DIAGNOSIS — Z90710 Acquired absence of both cervix and uterus: Secondary | ICD-10-CM

## 2019-01-23 DIAGNOSIS — E1142 Type 2 diabetes mellitus with diabetic polyneuropathy: Secondary | ICD-10-CM | POA: Diagnosis present

## 2019-01-23 DIAGNOSIS — C7800 Secondary malignant neoplasm of unspecified lung: Secondary | ICD-10-CM | POA: Diagnosis present

## 2019-01-23 DIAGNOSIS — M7989 Other specified soft tissue disorders: Secondary | ICD-10-CM | POA: Diagnosis present

## 2019-01-23 DIAGNOSIS — C5700 Malignant neoplasm of unspecified fallopian tube: Secondary | ICD-10-CM | POA: Diagnosis present

## 2019-01-23 DIAGNOSIS — N179 Acute kidney failure, unspecified: Secondary | ICD-10-CM | POA: Diagnosis present

## 2019-01-23 DIAGNOSIS — R52 Pain, unspecified: Secondary | ICD-10-CM | POA: Diagnosis not present

## 2019-01-23 DIAGNOSIS — Z86718 Personal history of other venous thrombosis and embolism: Secondary | ICD-10-CM

## 2019-01-23 DIAGNOSIS — Z8543 Personal history of malignant neoplasm of ovary: Secondary | ICD-10-CM

## 2019-01-23 DIAGNOSIS — K219 Gastro-esophageal reflux disease without esophagitis: Secondary | ICD-10-CM | POA: Diagnosis present

## 2019-01-23 DIAGNOSIS — T426X5A Adverse effect of other antiepileptic and sedative-hypnotic drugs, initial encounter: Secondary | ICD-10-CM | POA: Diagnosis present

## 2019-01-23 DIAGNOSIS — R32 Unspecified urinary incontinence: Secondary | ICD-10-CM | POA: Diagnosis present

## 2019-01-23 DIAGNOSIS — Z20828 Contact with and (suspected) exposure to other viral communicable diseases: Secondary | ICD-10-CM | POA: Diagnosis present

## 2019-01-23 DIAGNOSIS — J9601 Acute respiratory failure with hypoxia: Secondary | ICD-10-CM

## 2019-01-23 DIAGNOSIS — N131 Hydronephrosis with ureteral stricture, not elsewhere classified: Secondary | ICD-10-CM | POA: Diagnosis not present

## 2019-01-23 DIAGNOSIS — B3749 Other urogenital candidiasis: Secondary | ICD-10-CM | POA: Diagnosis present

## 2019-01-23 DIAGNOSIS — Z515 Encounter for palliative care: Secondary | ICD-10-CM | POA: Diagnosis not present

## 2019-01-23 DIAGNOSIS — G92 Toxic encephalopathy: Secondary | ICD-10-CM | POA: Diagnosis present

## 2019-01-23 DIAGNOSIS — E119 Type 2 diabetes mellitus without complications: Secondary | ICD-10-CM | POA: Diagnosis present

## 2019-01-23 DIAGNOSIS — I471 Supraventricular tachycardia: Secondary | ICD-10-CM | POA: Diagnosis present

## 2019-01-23 HISTORY — DX: Lymphedema, not elsewhere classified: I89.0

## 2019-01-23 LAB — CBC WITH DIFFERENTIAL/PLATELET
Abs Immature Granulocytes: 0.02 10*3/uL (ref 0.00–0.07)
Basophils Absolute: 0 10*3/uL (ref 0.0–0.1)
Basophils Relative: 0 %
Eosinophils Absolute: 0 10*3/uL (ref 0.0–0.5)
Eosinophils Relative: 1 %
HCT: 38.1 % (ref 36.0–46.0)
Hemoglobin: 12.3 g/dL (ref 12.0–15.0)
Immature Granulocytes: 0 %
Lymphocytes Relative: 12 %
Lymphs Abs: 1.1 10*3/uL (ref 0.7–4.0)
MCH: 31.1 pg (ref 26.0–34.0)
MCHC: 32.3 g/dL (ref 30.0–36.0)
MCV: 96.2 fL (ref 80.0–100.0)
Monocytes Absolute: 0.9 10*3/uL (ref 0.1–1.0)
Monocytes Relative: 10 %
Neutro Abs: 6.7 10*3/uL (ref 1.7–7.7)
Neutrophils Relative %: 77 %
Platelets: 380 10*3/uL (ref 150–400)
RBC: 3.96 MIL/uL (ref 3.87–5.11)
RDW: 13.4 % (ref 11.5–15.5)
WBC: 8.7 10*3/uL (ref 4.0–10.5)
nRBC: 0 % (ref 0.0–0.2)

## 2019-01-23 LAB — COMPREHENSIVE METABOLIC PANEL
ALT: 12 U/L (ref 0–44)
AST: 32 U/L (ref 15–41)
Albumin: 3.6 g/dL (ref 3.5–5.0)
Alkaline Phosphatase: 70 U/L (ref 38–126)
Anion gap: 9 (ref 5–15)
BUN: 22 mg/dL (ref 8–23)
CO2: 26 mmol/L (ref 22–32)
Calcium: 8.7 mg/dL — ABNORMAL LOW (ref 8.9–10.3)
Chloride: 101 mmol/L (ref 98–111)
Creatinine, Ser: 1.52 mg/dL — ABNORMAL HIGH (ref 0.44–1.00)
GFR calc Af Amer: 35 mL/min — ABNORMAL LOW (ref >60)
GFR calc non Af Amer: 30 mL/min — ABNORMAL LOW (ref >60)
Glucose, Bld: 142 mg/dL — ABNORMAL HIGH (ref 70–99)
Potassium: 4.3 mmol/L (ref 3.5–5.1)
Sodium: 136 mmol/L (ref 135–145)
Total Bilirubin: 0.4 mg/dL (ref 0.3–1.2)
Total Protein: 7.3 g/dL (ref 6.5–8.1)

## 2019-01-23 LAB — TROPONIN I (HIGH SENSITIVITY)
Troponin I (High Sensitivity): 6 ng/L (ref <18)
Troponin I (High Sensitivity): 4 ng/L (ref <18)

## 2019-01-23 MED ORDER — SODIUM CHLORIDE 0.9 % IV BOLUS
500.0000 mL | Freq: Once | INTRAVENOUS | Status: AC
  Administered 2019-01-24: 02:00:00 500 mL via INTRAVENOUS

## 2019-01-23 NOTE — ED Triage Notes (Signed)
Patient brought in by EMS from [facility]. Patient with complaint of left flank pain, nausea and vomiting that started last night. Patient was placed on O2 at 2l by ems. Per EMS patient initial oxygen saturations was 89% on room air.

## 2019-01-23 NOTE — ED Provider Notes (Addendum)
[facility] Emergency Department Provider Note  ____________________________________________   First MD Initiated Contact with Patient 01/23/19 2307     (approximate)  I have reviewed the triage vital signs and the nursing notes.   HISTORY  Chief Complaint Flank Pain    HPI Nicole Clayton is a 85 y.o. female with extensive chronic medical issues as listed below which notably includes metastatic carcinoma of the fallopian tube, history of pulmonary embolism and DVT at least previously on Xarelto, chronic lymphedema of the right leg, and DNR/DNI status.  She lives at [facility] facility and she presents tonight by EMS for evaluation of acute onset and intermittent but persistent sharp and stabbing pain in her left flank associated with nausea.  She has not been able to eat or drink as much as usual over the last 24 hours as result of the discomfort.  Sometimes the pain is severe but currently it is minimal.  Nothing in particular makes it feel better or worse.  She also notes that she has been having increased shortness of breath over the last 5 days or so although she cannot remember exactly how long its been going on.  She particularly notices it when she ambulates down the hall.  She does not have an oxygen requirement at home and has no prior lung diagnoses other than the previous diagnosis of pulmonary embolism.  She denies fever/chills, body aches, loss of smell or taste, sore throat, chest pain, vomiting (in spite of the severe nausea), and dysuria.        Past Medical History:  Diagnosis Date  . Bladder infection, chronic 10/19/2011  . Cancer (HCC)    Ovarian   . Carcinoma of fallopian tube ([place]) 10/05/2009   Overview:  Overview:  Overview:  Drs. Claiborne Rigg and Delorise Shiner Choksi Overview:  Drs. Claiborne Rigg and [employer]   . Concussion   . GERD (gastroesophageal reflux disease)   . Lymphedema of right lower extremity   . MI (mitral  incompetence) 10/27/2014   Overview:  MODERATE   . Mitral valve prolapse   . OAB (overactive bladder)   . Ovarian cancer ([place])   . Pulmonary embolism ([place])   . Tachycardia     Patient Active Problem List   Diagnosis Date Noted  . Obstructive uropathy 01/24/2019  . Candidiasis of skin 10/22/2018  . Intertriginous dermatitis associated with moisture 10/22/2018  . Weakness 10/21/2017  . Pain and swelling of right lower leg 10/11/2016  . Anemia 09/11/2015  . UTI (urinary tract infection) due to Enterococcus 07/19/2015  . 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 Neuropathy 02/10/2015  . Midline cystocele 12/15/2014  . Vaginal atrophy 12/15/2014  . Status post hysterectomy with oophorectomy 12/15/2014  . Celiac disease 11/07/2014  . MI (mitral incompetence) 10/27/2014  . Infection of urinary tract 08/12/2014  . CD (celiac disease) 07/12/2014  . Mixed hyperlipidemia 04/12/2014  . Paroxysmal supraventricular tachycardia ([place]) 04/12/2014  . Thrombocythemia ([place]) 09/26/2013  . Vitamin D deficiency 09/26/2013  . Frequent UTI 09/26/2013  . Chest pain 07/28/2013  . Edema 07/06/2013  . Other symptoms involving urinary system 07/07/2012  . Female genuine stress incontinence 02/10/2012  . Bladder infection, chronic 10/19/2011  . Urge incontinence 10/19/2011  . Malignant neoplasm of right fallopian tube ([place]) 10/05/2009    Past Surgical History:  Procedure Laterality Date  . ABDOMINAL HYSTERECTOMY    . CHOLECYSTECTOMY    . LAPAROSCOPIC BILATERAL SALPINGO OOPHERECTOMY  2011  . ovarian cancer surgery  2011    Prior to Admission medications   Medication Sig Start Date End Date Taking? Authorizing Provider  acetaminophen (TYLENOL) 650 MG CR tablet Take 650 mg by mouth 2 (two) times daily as needed for pain.     [provider]  acetaminophen-codeine (TYLENOL #3) 300-30 MG tablet Take 1 tablet by mouth every 4 (four) hours as needed for moderate pain or severe pain. Not to exceed 4000 mg  of acetaminophen in 24 hours. Patient not taking: Reported on 10/22/2018 06/12/18   Verlon Au, NP  albuterol (PROVENTIL HFA;VENTOLIN HFA) 108 (90 Base) MCG/ACT inhaler Inhale 2 puffs into the lungs every 6 (six) hours as needed for wheezing or shortness of breath. 02/27/18   Jacquelin Hawking, NP  benzonatate (TESSALON) 200 MG capsule Take 1 capsule by mouth 3 (three) times daily. 03/05/18   [provider]  calcium carbonate (TUMS - DOSED IN MG ELEMENTAL CALCIUM) 500 MG chewable tablet Chew 2 tablets by mouth every 2 (two) hours as needed for indigestion or heartburn.    [provider]  cetirizine (ZYRTEC) 10 MG tablet Take 10 mg by mouth daily.     [provider]  chlorpheniramine-HYDROcodone (TUSSIONEX) 10-8 MG/5ML SUER Take 5 mLs by mouth at bedtime as needed for cough. Patient not taking: Reported on 10/22/2018 03/18/18   [name], MD  cholecalciferol (VITAMIN D) 1000 units tablet Take 1,000 Units by mouth daily.     [provider]  conjugated estrogens (PREMARIN) vaginal cream Apply 0.5 grams twice weekly (with applicator if needed). Patient not taking: Reported on 10/15/2018 09/11/18   Rubie Maid, MD  docusate sodium (COLACE) 100 MG capsule Take 100 mg by mouth daily as needed for mild constipation.     [provider]  fluticasone (FLONASE) 50 MCG/ACT nasal spray Place 2 sprays into both nostrils daily.     [provider]  furosemide (LASIX) 20 MG tablet Take 1 tablet (20 mg total) by mouth daily. 03/18/18   [name], MD  gabapentin (NEURONTIN) 300 MG capsule TAKE 1 CAPSULE BY MOUTH AT BEDTIME 08/14/18   [name], MD  gabapentin (NEURONTIN) 600 MG tablet Take 600 mg by mouth every morning.    [provider]  loperamide (IMODIUM) 2 MG capsule Take 2 mg by mouth as needed for diarrhea or loose stools. Do not exceed 4 capsules in a 24 hour period.    [provider]  metoprolol  succinate (TOPROL-XL) 50 MG 24 hr tablet Take 50 mg by mouth daily.  05/17/17   [provider]  Multiple Vitamin (MULTIVITAMIN WITH MINERALS) TABS tablet Take 1 tablet by mouth daily.    [provider]  nitrofurantoin, macrocrystal-monohydrate, (MACROBID) 100 MG capsule Take 1 capsule (100 mg total) by mouth daily. 10/16/18   [name], MD  nystatin (MYCOSTATIN/NYSTOP) powder APPLY TOPICALLY FOUR TIMES A DAY FOR CANDIDIASIS 11/06/18   Verlon Au, NP  ondansetron (ZOFRAN) 4 MG tablet Take 4 mg by mouth every 8 (eight) hours as needed for nausea or vomiting.    [provider]  oxybutynin (DITROPAN-XL) 10 MG 24 hr tablet TAKE 1 TABLET BY MOUTH AT BEDTIME **DO NOT CRUSH** 11/19/18   [name], MD  pantoprazole (PROTONIX) 40 MG tablet Take 40 mg by mouth daily.  10/18/11   [provider]    Allergies Benadryl [diphenhydramine], Tamsulosin, Alendronate sodium, Duloxetine, Duloxetine hcl, Risedronate sodium, Lovastatin, and Sulfa antibiotics  Family History  Problem Relation Age of Onset  . Ovarian cancer Other   . Breast cancer Neg Hx   . Colon cancer Neg Hx   . Diabetes Neg Hx   . Heart disease Neg Hx   . Kidney disease Neg Hx   . Bladder Cancer Neg Hx     Social History Social History   Tobacco Use  . Smoking status: Never Smoker  . Smokeless tobacco: Never Used  Substance Use Topics  . Alcohol use: No  . Drug use: No    Review of Systems Constitutional: No fever/chills Eyes: No visual changes. ENT: No sore throat. Cardiovascular: Denies chest pain. Respiratory: Worsening shortness of breath particularly with exertion, uncertain timeframe but definitely over the last few days. Gastrointestinal: Left flank pain associated with nausea, no vomiting, also reports decreased oral intake over the last 24 hours. Genitourinary: Negative for dysuria. Musculoskeletal: Sharp left flank pain as described above. Integumentary:  Negative for rash. Neurological: Negative for headaches, focal weakness or numbness.   ____________________________________________   PHYSICAL EXAM:  VITAL SIGNS: ED Triage Vitals  Enc Vitals Group     BP 01/23/19 2016 (!) 146/70     Pulse Rate 01/23/19 2016 82     Resp 01/23/19 2016 18     Temp 01/23/19 2016 98.8 F (37.1 C)     Temp Source 01/23/19 2016 Oral     SpO2 01/23/19 2016 97 %     Weight 01/23/19 2017 62.6 kg (138 lb)     Height 01/23/19 2017 1.524 m (5')     Head Circumference --      Peak Flow --      Pain Score 01/23/19 2017 0     Pain Loc --      Pain Edu? --      Excl. in [place]? --     Constitutional: Alert and oriented.  Elderly, appears to be in no distress at baseline on 2 L of oxygen by nasal cannula. Eyes: Conjunctivae are normal.  Head: Atraumatic. Nose: No congestion/rhinnorhea. Mouth/Throat: Patient is wearing a mask. Neck: No stridor.  No meningeal signs.   Cardiovascular: Normal rate, regular rhythm. Good peripheral circulation. Grossly normal heart sounds. Respiratory: Normal respiratory effort.  No retractions. Gastrointestinal: Soft and nondistended.  Diffusely tender throughout the abdomen but seems to be worse on the left.  No rebound and no guarding. Musculoskeletal: Left CVA tenderness to percussion, mild. Neurologic:  Normal speech and language. No gross focal neurologic deficits are appreciated.  Skin:  Skin is warm, dry and intact. Psychiatric: Mood and affect are normal. Speech and behavior are normal.  ____________________________________________   LABS (all labs ordered are listed, but only abnormal results are displayed)  Labs Reviewed  COMPREHENSIVE METABOLIC PANEL - Abnormal; Notable for the following components:      Result Value   Glucose, Bld 142 (*)    Creatinine, Ser 1.52 (*)    Calcium 8.7 (*)    GFR calc non Af Amer 30 (*)    GFR calc Af Amer 35 (*)    All other components within normal limits  BRAIN NATRIURETIC  PEPTIDE - Abnormal; Notable for the following components:   B Natriuretic Peptide 247.0 (*)    All other components within normal limits  FIBRIN DERIVATIVES D-DIMER ([facility] ONLY) - Abnormal; Notable for the following components:   Fibrin derivatives D-dimer [facility]) 1,924.65 (*)    All other components within normal limits  LACTATE DEHYDROGENASE - Abnormal; Notable for the following components:  LDH 279 (*)    All other components within normal limits  CULTURE, BLOOD (ROUTINE X 2)  CULTURE, BLOOD (ROUTINE X 2)  RESPIRATORY PANEL BY RT PCR (FLU A&B, COVID)  CBC WITH DIFFERENTIAL/PLATELET  FERRITIN  FIBRINOGEN  PROCALCITONIN  TRIGLYCERIDES  URINALYSIS, COMPLETE (UACMP) WITH MICROSCOPIC  LACTIC ACID, PLASMA  LACTIC ACID, PLASMA  C-REACTIVE PROTEIN  BASIC METABOLIC PANEL  CBC  POC SARS CORONAVIRUS 2 AG -  ED  POC SARS CORONAVIRUS 2 AG  TROPONIN I (HIGH SENSITIVITY)  TROPONIN I (HIGH SENSITIVITY)   ____________________________________________  EKG  ED ECG REPORT I, Hinda Kehr, the attending physician, personally viewed and interpreted this ECG.  Date: 01/23/2019 EKG Time: 20:22 Rate: 78 Rhythm: normal sinus rhythm QRS Axis: normal Intervals: Mild LVH ST/T Wave abnormalities: Non-specific ST segment / T-wave changes, but no clear evidence of acute ischemia. Narrative Interpretation: no definitive evidence of acute ischemia; does not meet STEMI criteria.   ____________________________________________  RADIOLOGY I, Hinda Kehr, personally viewed and evaluated these images (plain radiographs) as part of my medical decision making, as well as reviewing the written report by the radiologist.  ED MD interpretation:  increased opacity in both lower lungs, concerning for PNA.  Official radiology report(s): CT ABDOMEN PELVIS WO CONTRAST  Result Date: 01/24/2019 CLINICAL DATA:  Flank pain. Stone disease suspected. History of adenocarcinoma of the fallopian tube now with sharp  left flank pain and nausea. A TI and hypoxemia. Possible pneumonia on chest x-ray. EXAM: CT CHEST, ABDOMEN AND PELVIS WITHOUT CONTRAST TECHNIQUE: Multidetector CT imaging of the chest, abdomen and pelvis was performed following the standard protocol without IV contrast. COMPARISON:  CT dated 10/21/2017. FINDINGS: CT CHEST FINDINGS Cardiovascular: The heart is mildly enlarged. Aortic calcifications are noted. There is a well-positioned right-sided Port-A-Cath with tip terminating near the cavoatrial junction. There is no significant pericardial effusion. Mediastinum/Nodes: --No mediastinal or hilar lymphadenopathy. --No axillary lymphadenopathy. --No supraclavicular lymphadenopathy. --Normal thyroid gland. --there is a large hiatal hernia with the majority of the stomach herniated into the thoracic cavity. Lungs/Pleura: There metastatic pulmonary nodules throughout the lung. The largest is located in the right lower lobe and measures approximately 2.2 cm. These were not present on the patient's CT chest from Jun 10, 2017. There is atelectasis at the lung bases. There are trace bilateral pleural effusions. There is no pneumothorax. The trachea is unremarkable. Musculoskeletal: No chest wall abnormality. No acute or significant osseous findings. CT ABDOMEN PELVIS FINDINGS Hepatobiliary: There is a 2.4 cm hypoattenuating mass in the right hepatic lobe. The left hepatic lobe appears somewhat atrophic. This is not significantly changed from prior study. Status post cholecystectomy.There is biliary ductal dilatation involving the common bile duct, not significantly changed from prior study. Pancreas: Normal contours without ductal dilatation. No peripancreatic fluid collection. Spleen: No splenic laceration or hematoma. Adrenals/Urinary Tract: --Adrenal glands: No adrenal hemorrhage. --Right kidney/ureter: No hydronephrosis or perinephric hematoma. --Left kidney/ureter: There is moderate to severe left-sided  hydroureteronephrosis to the level of the urinary bladder. This is felt to be secondary to an obstructing mass in the distal left ureter (axial series 2, image 77). --Urinary bladder: The bladder is moderately distended. Stomach/Bowel: --Stomach/Duodenum: The majority of the stomach is herniated into the thoracic cavity. --Small bowel: No dilatation or inflammation. --Colon: There is a large amount of stool in the colon. There is sigmoid diverticulosis without CT evidence for diverticulitis. --Appendix: Not visualized. No right lower quadrant inflammation or free fluid. Vascular/Lymphatic: Atherosclerotic calcification is present within the non-aneurysmal  abdominal aorta, without hemodynamically significant stenosis. --there are mildly enlarged retroperitoneal lymph nodes. --No mesenteric lymphadenopathy. --their pathologically enlarged inguinal lymph nodes. For example there is a 1.4 cm left inguinal lymph node. Reproductive: Status post hysterectomy. No adnexal mass. There is a pessary in place. Other: No ascites or free air. There is a large right inguinal mass measuring approximately 6.9 by 5.7 by 6.8 cm. Musculoskeletal. There is stable height loss of the L3 vertebral body. There is grade 1 anterolisthesis of L4 on L5, likely degenerative in etiology. IMPRESSION: 1. Moderate to severe left-sided hydroureteronephrosis to the level of the urinary bladder, felt to be secondary to an obstructing mass in the distal left ureter. 2. Interval development of metastatic disease to the lungs and liver. 3. Large 6.9 cm right inguinal mass concerning for metastatic disease. There are pathologically enlarged left inguinal lymph nodes. 4. Moderately distended urinary bladder. 5. Large hiatal hernia with the majority of the stomach herniated into the thoracic cavity. 6. Trace bilateral pleural effusions with adjacent atelectasis. 7. Sigmoid diverticulosis without CT evidence for diverticulitis. Aortic Atherosclerosis  (ICD10-I70.0). Electronically Signed   By: Constance Holster M.D.   On: 01/24/2019 00:45   DG Chest 2 View  Result Date: 01/23/2019 CLINICAL DATA:  Hypoxia. Nausea and vomiting. Left flank pain. EXAM: CHEST - 2 VIEW COMPARISON:  03/26/2018 FINDINGS: Stable cardiomegaly. Right-sided Port-A-Cath remains in place. Large hiatal hernia is again seen. Increased opacity is seen in both lower lungs, suspicious for pneumonia. No evidence of pleural effusion. Pulmonary hyperinflation is again seen. IMPRESSION: 1. Increased opacity in both lower lungs, suspicious for pneumonia. 2. Large hiatal hernia 3. Stable cardiomegaly and pulmonary hyperinflation. Electronically Signed   By: Marlaine Hind M.D.   On: 01/23/2019 21:00   CT Chest Wo Contrast  Result Date: 01/24/2019 CLINICAL DATA:  Flank pain. Stone disease suspected. History of adenocarcinoma of the fallopian tube now with sharp left flank pain and nausea. A TI and hypoxemia. Possible pneumonia on chest x-ray. EXAM: CT CHEST, ABDOMEN AND PELVIS WITHOUT CONTRAST TECHNIQUE: Multidetector CT imaging of the chest, abdomen and pelvis was performed following the standard protocol without IV contrast. COMPARISON:  CT dated 10/21/2017. FINDINGS: CT CHEST FINDINGS Cardiovascular: The heart is mildly enlarged. Aortic calcifications are noted. There is a well-positioned right-sided Port-A-Cath with tip terminating near the cavoatrial junction. There is no significant pericardial effusion. Mediastinum/Nodes: --No mediastinal or hilar lymphadenopathy. --No axillary lymphadenopathy. --No supraclavicular lymphadenopathy. --Normal thyroid gland. --there is a large hiatal hernia with the majority of the stomach herniated into the thoracic cavity. Lungs/Pleura: There metastatic pulmonary nodules throughout the lung. The largest is located in the right lower lobe and measures approximately 2.2 cm. These were not present on the patient's CT chest from Jun 10, 2017. There is  atelectasis at the lung bases. There are trace bilateral pleural effusions. There is no pneumothorax. The trachea is unremarkable. Musculoskeletal: No chest wall abnormality. No acute or significant osseous findings. CT ABDOMEN PELVIS FINDINGS Hepatobiliary: There is a 2.4 cm hypoattenuating mass in the right hepatic lobe. The left hepatic lobe appears somewhat atrophic. This is not significantly changed from prior study. Status post cholecystectomy.There is biliary ductal dilatation involving the common bile duct, not significantly changed from prior study. Pancreas: Normal contours without ductal dilatation. No peripancreatic fluid collection. Spleen: No splenic laceration or hematoma. Adrenals/Urinary Tract: --Adrenal glands: No adrenal hemorrhage. --Right kidney/ureter: No hydronephrosis or perinephric hematoma. --Left kidney/ureter: There is moderate to severe left-sided hydroureteronephrosis to the level of  the urinary bladder. This is felt to be secondary to an obstructing mass in the distal left ureter (axial series 2, image 77). --Urinary bladder: The bladder is moderately distended. Stomach/Bowel: --Stomach/Duodenum: The majority of the stomach is herniated into the thoracic cavity. --Small bowel: No dilatation or inflammation. --Colon: There is a large amount of stool in the colon. There is sigmoid diverticulosis without CT evidence for diverticulitis. --Appendix: Not visualized. No right lower quadrant inflammation or free fluid. Vascular/Lymphatic: Atherosclerotic calcification is present within the non-aneurysmal abdominal aorta, without hemodynamically significant stenosis. --there are mildly enlarged retroperitoneal lymph nodes. --No mesenteric lymphadenopathy. --their pathologically enlarged inguinal lymph nodes. For example there is a 1.4 cm left inguinal lymph node. Reproductive: Status post hysterectomy. No adnexal mass. There is a pessary in place. Other: No ascites or free air. There is a large  right inguinal mass measuring approximately 6.9 by 5.7 by 6.8 cm. Musculoskeletal. There is stable height loss of the L3 vertebral body. There is grade 1 anterolisthesis of L4 on L5, likely degenerative in etiology. IMPRESSION: 1. Moderate to severe left-sided hydroureteronephrosis to the level of the urinary bladder, felt to be secondary to an obstructing mass in the distal left ureter. 2. Interval development of metastatic disease to the lungs and liver. 3. Large 6.9 cm right inguinal mass concerning for metastatic disease. There are pathologically enlarged left inguinal lymph nodes. 4. Moderately distended urinary bladder. 5. Large hiatal hernia with the majority of the stomach herniated into the thoracic cavity. 6. Trace bilateral pleural effusions with adjacent atelectasis. 7. Sigmoid diverticulosis without CT evidence for diverticulitis. Aortic Atherosclerosis (ICD10-I70.0). Electronically Signed   By: Constance Holster M.D.   On: 01/24/2019 00:45    ____________________________________________   PROCEDURES   Procedure(s) performed (including Critical Care):  .Critical Care Performed by: Hinda Kehr, MD Authorized by: Hinda Kehr, MD   Critical care provider statement:    Critical care time (minutes):  30   Critical care time was exclusive of:  Separately billable procedures and treating other patients   Critical care was necessary to treat or prevent imminent or life-threatening deterioration of the following conditions:  Respiratory failure (suspected COVID-19 requiring supplemental oxygen)   Critical care was time spent personally by me on the following activities:  Development of treatment plan with patient or surrogate, discussions with consultants, evaluation of patient's response to treatment, examination of patient, obtaining history from patient or surrogate, ordering and performing treatments and interventions, ordering and review of laboratory studies, ordering and review of  radiographic studies, pulse oximetry, re-evaluation of patient's condition and review of old charts     ____________________________________________   INITIAL IMPRESSION / MDM / [place] / ED COURSE  As part of my medical decision making, I reviewed the following data within the [place] notes reviewed and incorporated, Labs reviewed , EKG interpreted , Old chart reviewed, Radiograph reviewed , Discussed with admitting physician (Dr. Sidney Ace), Discussed with urologist (Dr. Jeffie Pollock) and Notes from prior ED visits   Differential diagnosis includes, but is not limited to, renal/ureteral colic, UTI/pyelonephritis, dehydration with acute kidney injury/acute renal failure, complication of her chronic neoplastic disease, COVID-19, healthcare associated pneumonia (bacterial).  The patient is from a nursing facility and I have a high degree of suspicion for COVID-19 although she reports she has been previously tested and was negative.  She is unable to tell us exactly when her symptoms began but she thinks that she has been increasingly short of breath over  at least the last few days, possibly a week.  I have ordered the rapid antigen test but anticipate we will likely need to obtain a PCR swab because anticipate the antigen will likely be negative.  CXR suggests pneumonia, but patient is afebrile and has no leukocytosis.    CMP notable for significantly elevated Cr at 1.52, with a baseline of around 0.8.  Suspect volume depletion in the setting of acute illness and nausea causing decreased oral intake.  Ordering 500 mL NS IV bolus.  Proceeding with suspected COVID-19 workup including "COVID labs", as well as CT chest and abd/pelvis to evaluate for possible pneumonia vs pulmonary vascular congestion vs viral pattern suggestive of COVID-19, as well as ureteral colic, acute intraabdominal infection, or other acute pathology.  Advised the patient that she will need to be  admitted if for nothing else for the hypoxemia and the dehydration/AKI.        Clinical Course as of Jan 23 234  Nancy Fetter Jan 24, 2019  0006 Received permission from patient to talk with daughter, Ms. Laster, who is sitting in her car in the parking lot.  I updated her by phone and explained that the patient will be staying in the hospital regardless of the CT findings.  She understands and agrees with this plan.   [CF]  0103 Extensive metastatic cancer in the lungs but without evidence of any acute infection or disease process including clinically significant pleural effusions or cardiac effusion.  CT Chest Wo Contrast [CF]  0104 Compared to prior CT scan for more than a year ago, the patient has new development of moderate to severe hydronephrosis on the left due to ureteral obstruction from a mass.  This is likely the source of the patient's pain.  I have paged Dr. Jeffie Pollock with urology to discuss whether he thinks the patient would benefit from a nephrostomy given the chronic issues with which the patient is dealing.  CT ABDOMEN PELVIS WO CONTRAST [CF]  0108 I discussed the case with Dr. Jeffie Pollock.  He said that there are options available such as ureteral stent versus nephrostomy and that he would be happy to see the patient in the morning.  As long as the patient does not have a grossly positive urinalysis she should not require emergent intervention tonight.     [CF]  [phone] (delayed documentation) Discussed case by phone with Dr. Sidney Ace who will admit.   [CF]  0233 Procalcitonin: <0.10 [CF]  0233 Neoplastic disease vs COVID  Fibrin derivatives [facility])(!): 1,924.65 [CF]  0235 Of note, I also discussed with the hospitalist the possibility of PE, but given the patient's reduced kidney function, lack of chest pain, no tachycardia, and no tachypnea, I would prefer to hold off on CTA chest at this time.   [CF]    Clinical Course User Index [CF] Hinda Kehr, MD      ____________________________________________  FINAL CLINICAL IMPRESSION(S) / ED DIAGNOSES  Final diagnoses:  Acute respiratory failure with hypoxemia ([employer])  Acute kidney injury ([place])  Dehydration  DNR (do not resuscitate)  Malignant neoplasm of fallopian tube, unspecified laterality ([place])  Hydroureteronephrosis  Suspected COVID-19 virus infection     MEDICATIONS GIVEN DURING THIS VISIT:  Medications  furosemide (LASIX) tablet 20 mg (has no administration in time range)  metoprolol succinate (TOPROL-XL) 24 hr tablet 50 mg (has no administration in time range)  calcium carbonate (TUMS - dosed in mg elemental calcium) chewable tablet 400 mg of elemental calcium (has no administration  in time range)  docusate sodium (COLACE) capsule 100 mg (has no administration in time range)  ondansetron (ZOFRAN) tablet 4 mg (has no administration in time range)  pantoprazole (PROTONIX) EC tablet 40 mg (has no administration in time range)  oxybutynin (DITROPAN-XL) 24 hr tablet 10 mg (has no administration in time range)  gabapentin (NEURONTIN) capsule 300 mg (has no administration in time range)  gabapentin (NEURONTIN) tablet 600 mg (has no administration in time range)  cholecalciferol (VITAMIN D) tablet 1,000 Units (has no administration in time range)  multivitamin with minerals tablet 1 tablet (has no administration in time range)  albuterol (VENTOLIN HFA) 108 (90 Base) MCG/ACT inhaler 2 puff (has no administration in time range)  benzonatate (TESSALON) capsule 200 mg (has no administration in time range)  loratadine (CLARITIN) tablet 10 mg (has no administration in time range)  fluticasone (FLONASE) 50 MCG/ACT nasal spray 2 spray (has no administration in time range)  enoxaparin (LOVENOX) injection 40 mg (has no administration in time range)  0.9 %  sodium chloride infusion (has no administration in time range)  acetaminophen (TYLENOL) tablet 650 mg (has no administration in time range)     Or  acetaminophen (TYLENOL) suppository 650 mg (has no administration in time range)  traZODone (DESYREL) tablet 25 mg (has no administration in time range)  magnesium hydroxide (MILK OF MAGNESIA) suspension 30 mL (has no administration in time range)  ondansetron (ZOFRAN) tablet 4 mg (has no administration in time range)    Or  ondansetron (ZOFRAN) injection 4 mg (has no administration in time range)  oxyCODONE (Oxy IR/ROXICODONE) immediate release tablet 5 mg (has no administration in time range)  morphine 2 MG/ML injection 2 mg (has no administration in time range)  sodium chloride 0.9 % bolus 500 mL (500 mLs Intravenous New Bag/Given 01/24/19 0150)     ED Discharge Orders    None      *Please note:  Nicole Clayton was evaluated in Emergency Department on 01/24/2019 for the symptoms described in the history of present illness. She was evaluated in the context of the global COVID-19 pandemic, which necessitated consideration that the patient might be at risk for infection with the SARS-CoV-2 virus that causes COVID-19. Institutional protocols and algorithms that pertain to the evaluation of patients at risk for COVID-19 are in a state of rapid change based on information released by regulatory bodies including the CDC and federal and state organizations. These policies and algorithms were followed during the patient's care in the ED.  Some ED evaluations and interventions may be delayed as a result of limited staffing during the pandemic.*  Note:  This document was prepared using Dragon voice recognition software and may include unintentional dictation errors.   Hinda Kehr, MD 01/24/19 Raynald Kemp    Hinda Kehr, MD 01/24/19 [phone]

## 2019-01-24 ENCOUNTER — Encounter: Payer: Self-pay | Admitting: Urology

## 2019-01-24 DIAGNOSIS — Z66 Do not resuscitate: Secondary | ICD-10-CM | POA: Diagnosis present

## 2019-01-24 DIAGNOSIS — K219 Gastro-esophageal reflux disease without esophagitis: Secondary | ICD-10-CM | POA: Diagnosis present

## 2019-01-24 DIAGNOSIS — B3749 Other urogenital candidiasis: Secondary | ICD-10-CM | POA: Diagnosis present

## 2019-01-24 DIAGNOSIS — Z888 Allergy status to other drugs, medicaments and biological substances status: Secondary | ICD-10-CM | POA: Diagnosis not present

## 2019-01-24 DIAGNOSIS — E860 Dehydration: Secondary | ICD-10-CM | POA: Diagnosis present

## 2019-01-24 DIAGNOSIS — J189 Pneumonia, unspecified organism: Secondary | ICD-10-CM | POA: Diagnosis not present

## 2019-01-24 DIAGNOSIS — C787 Secondary malignant neoplasm of liver and intrahepatic bile duct: Secondary | ICD-10-CM | POA: Diagnosis present

## 2019-01-24 DIAGNOSIS — C7800 Secondary malignant neoplasm of unspecified lung: Secondary | ICD-10-CM | POA: Diagnosis not present

## 2019-01-24 DIAGNOSIS — I341 Nonrheumatic mitral (valve) prolapse: Secondary | ICD-10-CM | POA: Diagnosis present

## 2019-01-24 DIAGNOSIS — J9601 Acute respiratory failure with hypoxia: Secondary | ICD-10-CM

## 2019-01-24 DIAGNOSIS — K5730 Diverticulosis of large intestine without perforation or abscess without bleeding: Secondary | ICD-10-CM | POA: Diagnosis present

## 2019-01-24 DIAGNOSIS — C5700 Malignant neoplasm of unspecified fallopian tube: Secondary | ICD-10-CM | POA: Diagnosis present

## 2019-01-24 DIAGNOSIS — R1032 Left lower quadrant pain: Secondary | ICD-10-CM | POA: Diagnosis not present

## 2019-01-24 DIAGNOSIS — K449 Diaphragmatic hernia without obstruction or gangrene: Secondary | ICD-10-CM | POA: Diagnosis present

## 2019-01-24 DIAGNOSIS — Z86718 Personal history of other venous thrombosis and embolism: Secondary | ICD-10-CM | POA: Diagnosis not present

## 2019-01-24 DIAGNOSIS — I471 Supraventricular tachycardia: Secondary | ICD-10-CM | POA: Diagnosis present

## 2019-01-24 DIAGNOSIS — N132 Hydronephrosis with renal and ureteral calculous obstruction: Secondary | ICD-10-CM | POA: Diagnosis not present

## 2019-01-24 DIAGNOSIS — N1330 Unspecified hydronephrosis: Secondary | ICD-10-CM | POA: Diagnosis not present

## 2019-01-24 DIAGNOSIS — Z882 Allergy status to sulfonamides status: Secondary | ICD-10-CM | POA: Diagnosis not present

## 2019-01-24 DIAGNOSIS — Y92009 Unspecified place in unspecified non-institutional (private) residence as the place of occurrence of the external cause: Secondary | ICD-10-CM | POA: Diagnosis not present

## 2019-01-24 DIAGNOSIS — Z515 Encounter for palliative care: Secondary | ICD-10-CM | POA: Diagnosis not present

## 2019-01-24 DIAGNOSIS — N179 Acute kidney failure, unspecified: Secondary | ICD-10-CM

## 2019-01-24 DIAGNOSIS — I5033 Acute on chronic diastolic (congestive) heart failure: Secondary | ICD-10-CM | POA: Diagnosis not present

## 2019-01-24 DIAGNOSIS — Z20828 Contact with and (suspected) exposure to other viral communicable diseases: Secondary | ICD-10-CM | POA: Diagnosis not present

## 2019-01-24 DIAGNOSIS — N131 Hydronephrosis with ureteral stricture, not elsewhere classified: Secondary | ICD-10-CM | POA: Diagnosis not present

## 2019-01-24 DIAGNOSIS — Z86711 Personal history of pulmonary embolism: Secondary | ICD-10-CM | POA: Diagnosis not present

## 2019-01-24 DIAGNOSIS — E119 Type 2 diabetes mellitus without complications: Secondary | ICD-10-CM | POA: Diagnosis present

## 2019-01-24 DIAGNOSIS — G92 Toxic encephalopathy: Secondary | ICD-10-CM | POA: Diagnosis not present

## 2019-01-24 DIAGNOSIS — R278 Other lack of coordination: Secondary | ICD-10-CM | POA: Diagnosis present

## 2019-01-24 DIAGNOSIS — N139 Obstructive and reflux uropathy, unspecified: Secondary | ICD-10-CM | POA: Diagnosis present

## 2019-01-24 LAB — URINALYSIS, COMPLETE (UACMP) WITH MICROSCOPIC
Bilirubin Urine: NEGATIVE
Glucose, UA: NEGATIVE mg/dL
Hgb urine dipstick: NEGATIVE
Ketones, ur: NEGATIVE mg/dL
Nitrite: NEGATIVE
Protein, ur: NEGATIVE mg/dL
Specific Gravity, Urine: 1.01 (ref 1.005–1.030)
Squamous Epithelial / HPF: NONE SEEN (ref 0–5)
pH: 6 (ref 5.0–8.0)

## 2019-01-24 LAB — BASIC METABOLIC PANEL
Anion gap: 8 (ref 5–15)
BUN: 21 mg/dL (ref 8–23)
CO2: 26 mmol/L (ref 22–32)
Calcium: 8.5 mg/dL — ABNORMAL LOW (ref 8.9–10.3)
Chloride: 104 mmol/L (ref 98–111)
Creatinine, Ser: 1.49 mg/dL — ABNORMAL HIGH (ref 0.44–1.00)
GFR calc Af Amer: 36 mL/min — ABNORMAL LOW (ref >60)
GFR calc non Af Amer: 31 mL/min — ABNORMAL LOW (ref >60)
Glucose, Bld: 113 mg/dL — ABNORMAL HIGH (ref 70–99)
Potassium: 4.1 mmol/L (ref 3.5–5.1)
Sodium: 138 mmol/L (ref 135–145)

## 2019-01-24 LAB — MRSA PCR SCREENING: MRSA by PCR: POSITIVE — AB

## 2019-01-24 LAB — RESPIRATORY PANEL BY RT PCR (FLU A&B, COVID)
Influenza A by PCR: NEGATIVE
Influenza B by PCR: NEGATIVE
SARS Coronavirus 2 by RT PCR: NEGATIVE

## 2019-01-24 LAB — GLUCOSE, CAPILLARY
Glucose-Capillary: 97 mg/dL (ref 70–99)
Glucose-Capillary: 111 mg/dL — ABNORMAL HIGH (ref 70–99)
Glucose-Capillary: 85 mg/dL (ref 70–99)
Glucose-Capillary: 122 mg/dL — ABNORMAL HIGH (ref 70–99)

## 2019-01-24 LAB — CBC
HCT: 36.4 % (ref 36.0–46.0)
Hemoglobin: 11.7 g/dL — ABNORMAL LOW (ref 12.0–15.0)
MCH: 31 pg (ref 26.0–34.0)
MCHC: 32.1 g/dL (ref 30.0–36.0)
MCV: 96.6 fL (ref 80.0–100.0)
Platelets: 340 10*3/uL (ref 150–400)
RBC: 3.77 MIL/uL — ABNORMAL LOW (ref 3.87–5.11)
RDW: 13.4 % (ref 11.5–15.5)
WBC: 7.4 10*3/uL (ref 4.0–10.5)
nRBC: 0 % (ref 0.0–0.2)

## 2019-01-24 LAB — LACTATE DEHYDROGENASE: LDH: 279 U/L — ABNORMAL HIGH (ref 98–192)

## 2019-01-24 LAB — FERRITIN: Ferritin: 32 ng/mL (ref 11–307)

## 2019-01-24 LAB — HEMOGLOBIN A1C
Hgb A1c MFr Bld: 6.5 % — ABNORMAL HIGH (ref 4.8–5.6)
Mean Plasma Glucose: 139.85 mg/dL

## 2019-01-24 LAB — POC SARS CORONAVIRUS 2 AG: SARS Coronavirus 2 Ag: NEGATIVE

## 2019-01-24 LAB — FIBRIN DERIVATIVES D-DIMER (ARMC ONLY): Fibrin derivatives D-dimer (ARMC): 1924.65 ng/mL (FEU) — ABNORMAL HIGH (ref 0.00–499.00)

## 2019-01-24 LAB — BRAIN NATRIURETIC PEPTIDE: B Natriuretic Peptide: 247 pg/mL — ABNORMAL HIGH (ref 0.0–100.0)

## 2019-01-24 LAB — TRIGLYCERIDES: Triglycerides: 93 mg/dL (ref <150)

## 2019-01-24 LAB — C-REACTIVE PROTEIN: CRP: 1.7 mg/dL — ABNORMAL HIGH (ref <1.0)

## 2019-01-24 LAB — FIBRINOGEN: Fibrinogen: 471 mg/dL (ref 210–475)

## 2019-01-24 LAB — LACTIC ACID, PLASMA: Lactic Acid, Venous: 1.2 mmol/L (ref 0.5–1.9)

## 2019-01-24 LAB — PROCALCITONIN: Procalcitonin: <0.1 ng/mL

## 2019-01-24 MED ORDER — METOPROLOL SUCCINATE ER 50 MG PO TB24
50.0000 mg | ORAL_TABLET | Freq: Every day | ORAL | Status: DC
  Administered 2019-01-25: 10:00:00 50 mg via ORAL
  Filled 2019-01-24 (×2): qty 1

## 2019-01-24 MED ORDER — CHLORHEXIDINE GLUCONATE CLOTH 2 % EX PADS
6.0000 | MEDICATED_PAD | Freq: Every day | CUTANEOUS | Status: DC
  Administered 2019-01-25 – 2019-01-27 (×3): 6 via TOPICAL

## 2019-01-24 MED ORDER — TRAZODONE HCL 50 MG PO TABS: 25.0000 mg | ORAL_TABLET | Freq: Every evening | ORAL | Status: DC | PRN

## 2019-01-24 MED ORDER — PANTOPRAZOLE SODIUM 40 MG PO TBEC
40.0000 mg | DELAYED_RELEASE_TABLET | Freq: Every day | ORAL | Status: DC
  Administered 2019-01-24 – 2019-01-27 (×4): 40 mg via ORAL
  Filled 2019-01-24 (×4): qty 1

## 2019-01-24 MED ORDER — SODIUM CHLORIDE 0.9 % IV SOLN: INTRAVENOUS | Status: DC

## 2019-01-24 MED ORDER — ENOXAPARIN SODIUM 30 MG/0.3ML ~~LOC~~ SOLN
30.0000 mg | SUBCUTANEOUS | Status: DC
  Administered 2019-01-24 – 2019-01-27 (×4): 30 mg via SUBCUTANEOUS
  Filled 2019-01-24 (×4): qty 0.3

## 2019-01-24 MED ORDER — LORATADINE 10 MG PO TABS
10.0000 mg | ORAL_TABLET | Freq: Every day | ORAL | Status: DC
  Administered 2019-01-24 – 2019-01-25 (×2): 10 mg via ORAL
  Filled 2019-01-24 (×2): qty 1

## 2019-01-24 MED ORDER — OXYCODONE HCL 5 MG PO TABS
5.0000 mg | ORAL_TABLET | ORAL | Status: DC | PRN
  Filled 2019-01-24: qty 1

## 2019-01-24 MED ORDER — ONDANSETRON HCL 4 MG/2ML IJ SOLN
4.0000 mg | Freq: Four times a day (QID) | INTRAMUSCULAR | Status: DC | PRN
  Filled 2019-01-24: qty 2

## 2019-01-24 MED ORDER — DOCUSATE SODIUM 100 MG PO CAPS: 100.0000 mg | ORAL_CAPSULE | Freq: Every day | ORAL | Status: DC | PRN

## 2019-01-24 MED ORDER — ADULT MULTIVITAMIN W/MINERALS CH
1.0000 | ORAL_TABLET | Freq: Every day | ORAL | Status: DC
  Administered 2019-01-24 – 2019-01-25 (×2): 1 via ORAL
  Filled 2019-01-24 (×2): qty 1

## 2019-01-24 MED ORDER — ONDANSETRON HCL 4 MG PO TABS: 4.0000 mg | ORAL_TABLET | Freq: Four times a day (QID) | ORAL | Status: DC | PRN

## 2019-01-24 MED ORDER — VITAMIN D 25 MCG (1000 UNIT) PO TABS
1000.0000 [IU] | ORAL_TABLET | Freq: Every day | ORAL | Status: DC
  Administered 2019-01-25: 10:00:00 1000 [IU] via ORAL
  Filled 2019-01-24: qty 1

## 2019-01-24 MED ORDER — ALBUTEROL SULFATE (2.5 MG/3ML) 0.083% IN NEBU: 2.5000 mg | INHALATION_SOLUTION | Freq: Four times a day (QID) | RESPIRATORY_TRACT | Status: DC | PRN

## 2019-01-24 MED ORDER — BENZONATATE 100 MG PO CAPS: 200.0000 mg | ORAL_CAPSULE | Freq: Three times a day (TID) | ORAL | Status: DC

## 2019-01-24 MED ORDER — CALCIUM CARBONATE ANTACID 500 MG PO CHEW: 2.0000 | CHEWABLE_TABLET | ORAL | Status: DC | PRN

## 2019-01-24 MED ORDER — ACETAMINOPHEN 650 MG RE SUPP
650.0000 mg | Freq: Four times a day (QID) | RECTAL | Status: DC | PRN
  Filled 2019-01-24: qty 1

## 2019-01-24 MED ORDER — MORPHINE SULFATE (PF) 2 MG/ML IV SOLN: 2.0000 mg | INTRAVENOUS | Status: DC | PRN

## 2019-01-24 MED ORDER — MUPIROCIN 2 % EX OINT
1.0000 "application " | TOPICAL_OINTMENT | Freq: Two times a day (BID) | CUTANEOUS | Status: DC
  Administered 2019-01-24 – 2019-01-27 (×7): 1 via NASAL
  Filled 2019-01-24 (×2): qty 22

## 2019-01-24 MED ORDER — FUROSEMIDE 40 MG PO TABS: 20.0000 mg | ORAL_TABLET | Freq: Every day | ORAL | Status: DC

## 2019-01-24 MED ORDER — GABAPENTIN 300 MG PO CAPS
300.0000 mg | ORAL_CAPSULE | Freq: Every day | ORAL | Status: DC
  Administered 2019-01-24: 22:00:00 300 mg via ORAL
  Filled 2019-01-24: qty 1

## 2019-01-24 MED ORDER — GABAPENTIN 600 MG PO TABS
600.0000 mg | ORAL_TABLET | ORAL | Status: DC
  Administered 2019-01-24 – 2019-01-25 (×2): 600 mg via ORAL
  Filled 2019-01-24 (×2): qty 1

## 2019-01-24 MED ORDER — ACETAMINOPHEN 325 MG PO TABS
650.0000 mg | ORAL_TABLET | Freq: Four times a day (QID) | ORAL | Status: DC | PRN
  Administered 2019-01-24 – 2019-01-26 (×3): 650 mg via ORAL
  Filled 2019-01-24 (×3): qty 2

## 2019-01-24 MED ORDER — FLUTICASONE PROPIONATE 50 MCG/ACT NA SUSP
2.0000 | Freq: Every day | NASAL | Status: DC
  Administered 2019-01-26 – 2019-01-27 (×2): 2 via NASAL
  Filled 2019-01-24 (×2): qty 16

## 2019-01-24 MED ORDER — ONDANSETRON HCL 4 MG PO TABS: 4.0000 mg | ORAL_TABLET | Freq: Three times a day (TID) | ORAL | Status: DC | PRN

## 2019-01-24 MED ORDER — MAGNESIUM HYDROXIDE 400 MG/5ML PO SUSP: 30.0000 mL | Freq: Every day | ORAL | Status: DC | PRN

## 2019-01-24 MED ORDER — OXYBUTYNIN CHLORIDE ER 5 MG PO TB24
10.0000 mg | ORAL_TABLET | Freq: Every day | ORAL | Status: DC
  Filled 2019-01-24: qty 2

## 2019-01-24 MED ORDER — INSULIN ASPART 100 UNIT/ML ~~LOC~~ SOLN: 0.0000 [IU] | Freq: Three times a day (TID) | SUBCUTANEOUS | Status: DC

## 2019-01-24 NOTE — Consult Note (Signed)
Subjective: CC: Left flank pain.   Nicole Clayton is an 85 yo WF with metastatic cancer with a history of fallopian tube and ovarian cancer and is currently under Hospice care.  I was asked to her see in consutation by Dr. Sidney Ace for severe left flank pain that began acutely yesterday and is associated with CT findings of left hydronephrosis with a possible ureteral mass, a moderately distended bladder and AKI.   Nicole Clayton has had some intermittent pain over the last couple of weeks.  Nicole Clayton has had some nausea without vomiting.  He has had no dysuria or hematuria but has a history of recurrent UTI's.  He has a history of frequent voids and incontinence and relies on Depends for bladder management.  Nicole Clayton has had no fever or chills.   The CT C/A/P also shows new lung and liver mets and her bladder.  ROS:  Review of Systems  Constitutional: Negative for chills and fever.  Respiratory: Negative for shortness of breath.   Cardiovascular: Positive for leg swelling (left greater than right). Negative for chest pain.  Gastrointestinal: Positive for nausea. Negative for vomiting.  Genitourinary: Positive for flank pain. Negative for dysuria and hematuria.  All other systems reviewed and are negative.   Allergies  Allergen Reactions  . Benadryl [Diphenhydramine] Shortness Of Breath    Sob, rash, swelling  . Tamsulosin Swelling  . Alendronate Sodium     Other reaction(s): Other (See Comments) Dysphagia  . Duloxetine Diarrhea  . Duloxetine Hcl Diarrhea  . Risedronate Sodium Rash    Aching, dysphagia  . Lovastatin     Other reaction(s): Other (See Comments) GI upset  . Sulfa Antibiotics Rash    States Nicole Clayton can take but leave as allergy    Past Medical History:  Diagnosis Date  . Bladder infection, chronic 10/19/2011  . Cancer (HCC)    Ovarian   . 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 Claiborne Rigg and Delorise Shiner Choksi Overview:  Drs. Claiborne Rigg and [employer]   . Concussion   . GERD (gastroesophageal reflux disease)   . Lymphedema of right lower extremity   . MI (mitral incompetence) 10/27/2014   Overview:  MODERATE   . Mitral valve prolapse   . OAB (overactive bladder)   . Ovarian cancer ([place])   . Pulmonary embolism ([place])   . Tachycardia     Past Surgical History:  Procedure Laterality Date  . ABDOMINAL HYSTERECTOMY    . CHOLECYSTECTOMY    . LAPAROSCOPIC BILATERAL SALPINGO OOPHERECTOMY  2011  . ovarian cancer surgery  2011    Social History   Socioeconomic History  . Marital status: Married    Spouse name: Not on file  . Number of children: Not on file  . Years of education: Not on file  . Highest education level: Not on file  Occupational History  . Not on file  Tobacco Use  . Smoking status: Never Smoker  . Smokeless tobacco: Never Used  Substance and Sexual Activity  . Alcohol use: No  . Drug use: No  . Sexual activity: Not Currently    Birth control/protection: Surgical  Other Topics Concern  . Not on file  Social History Narrative  . Not on file   Social Determinants of Health   Financial Resource Strain:   . Difficulty of Paying Living Expenses: Not on file  Food Insecurity:   . Worried About Charity fundraiser in the Last  Year: Not on file  . Ran Out of Food in the Last Year: Not on file  Transportation Needs:   . Lack of Transportation (Medical): Not on file  . Lack of Transportation (Non-Medical): Not on file  Physical Activity:   . Days of Exercise per Week: Not on file  . Minutes of Exercise per Session: Not on file  Stress:   . Feeling of Stress : Not on file  Social Connections:   . Frequency of Communication with Friends and Family: Not on file  . Frequency of Social Gatherings with Friends and Family: Not on file  . Attends Religious Services: Not on file  . Active Member of Clubs or Organizations: Not on file  . Attends Archivist Meetings: Not on file  . Marital Status:  Not on file  Intimate Partner Violence:   . Fear of Current or Ex-Partner: Not on file  . Emotionally Abused: Not on file  . Physically Abused: Not on file  . Sexually Abused: Not on file    Family History  Problem Relation Age of Onset  . Ovarian cancer Other   . Breast cancer Neg Hx   . Colon cancer Neg Hx   . Diabetes Neg Hx   . Heart disease Neg Hx   . Kidney disease Neg Hx   . Bladder Cancer Neg Hx     Anti-infectives: Anti-infectives (From admission, onward)   None      Current Facility-Administered Medications  Medication Dose Route Frequency Provider Last Rate Last Admin  . 0.9 %  sodium chloride infusion   Intravenous Continuous Mansy, Jan A, MD 100 mL/hr at 01/24/19 0820 New Bag at 01/24/19 0820  . acetaminophen (TYLENOL) tablet 650 mg  650 mg Oral Q6H PRN Mansy, Jan A, MD       Or  . acetaminophen (TYLENOL) suppository 650 mg  650 mg Rectal Q6H PRN Mansy, Jan A, MD      . albuterol (PROVENTIL) (2.5 MG/3ML) 0.083% nebulizer solution 2.5 mg  2.5 mg Inhalation Q6H PRN Mansy, Jan A, MD      . cholecalciferol (VITAMIN D3) tablet 1,000 Units  1,000 Units Oral Daily Mansy, Jan A, MD      . docusate sodium (COLACE) capsule 100 mg  100 mg Oral Daily PRN Mansy, Jan A, MD      . enoxaparin (LOVENOX) injection 30 mg  30 mg Subcutaneous Q24H Mansy, Jan A, MD      . fluticasone (FLONASE) 50 MCG/ACT nasal spray 2 spray  2 spray Each Nare Daily Mansy, Jan A, MD      . gabapentin (NEURONTIN) capsule 300 mg  300 mg Oral QHS Mansy, Jan A, MD      . gabapentin (NEURONTIN) tablet 600 mg  600 mg Oral BH-q7a Mansy, Jan A, MD      . insulin aspart (novoLOG) injection 0-9 Units  0-9 Units Subcutaneous TID WC Mansy, Jan A, MD      . loratadine (CLARITIN) tablet 10 mg  10 mg Oral Daily Mansy, Jan A, MD      . magnesium hydroxide (MILK OF MAGNESIA) suspension 30 mL  30 mL Oral Daily PRN Mansy, Jan A, MD      . metoprolol succinate (TOPROL-XL) 24 hr tablet 50 mg  50 mg Oral Daily Mansy, Jan A,  MD      . morphine 2 MG/ML injection 2 mg  2 mg Intravenous Q4H PRN Mansy, Arvella Merles, MD      .  multivitamin with minerals tablet 1 tablet  1 tablet Oral Daily Mansy, Jan A, MD      . ondansetron [facility]) tablet 4 mg  4 mg Oral Q6H PRN Mansy, Jan A, MD       Or  . ondansetron [facility]) injection 4 mg  4 mg Intravenous Q6H PRN Mansy, Jan A, MD      . oxybutynin (DITROPAN-XL) 24 hr tablet 10 mg  10 mg Oral QHS Mansy, Jan A, MD      . oxyCODONE (Oxy IR/ROXICODONE) immediate release tablet 5 mg  5 mg Oral Q4H PRN Mansy, Jan A, MD      . pantoprazole (PROTONIX) EC tablet 40 mg  40 mg Oral Daily Mansy, Jan A, MD      . traZODone (DESYREL) tablet 25 mg  25 mg Oral QHS PRN Mansy, Arvella Merles, MD       Facility-Administered Medications Ordered in Other Encounters  Medication Dose Route Frequency Provider Last Rate Last Admin  . diphenhydrAMINE (BENADRYL) injection 50 mg  50 mg Intravenous Once Choksi, Janak, MD      . sodium chloride 0.9 % injection 10 mL  10 mL Intravenous PRN Forest Gleason, MD   10 mL at 01/12/15 1116  . sodium chloride 0.9 % injection 10 mL  10 mL Intravenous PRN Forest Gleason, MD   10 mL at 02/16/15 1025     Objective: Vital signs in last 24 hours: Temp:  [98.8 F (37.1 C)] 98.8 F (37.1 C) (12/19 2016) Pulse Rate:  [38-89] 67 (12/20 0700) Resp:  [14-26] 15 (12/20 0730) BP: (96-146)/(52-71) 120/63 (12/20 0730) SpO2:  [92 %-100 %] 100 % (12/20 0700) Weight:  [62.6 kg] 62.6 kg (12/19 2017)  Intake/Output from previous day: 12/19 0701 - 12/20 0700 In: 500 [IV Piggyback:500] Out: -  Intake/Output this shift: No intake/output data recorded.   Physical Exam Vitals reviewed.  Constitutional:      Appearance: Normal appearance.  HENT:     Head: Normocephalic and atraumatic.  Cardiovascular:     Rate and Rhythm: Normal rate and regular rhythm.  Pulmonary:     Effort: Pulmonary effort is normal. No respiratory distress.     Breath sounds: Normal breath sounds.  Abdominal:      General: Abdomen is flat.     Palpations: Abdomen is soft.     Tenderness: There is no abdominal tenderness. There is no left CVA tenderness.  Musculoskeletal:        General: Normal range of motion.     Cervical back: Normal range of motion and neck supple.     Left lower leg: Edema present.  Lymphadenopathy:     Cervical: No cervical adenopathy.     Lower Body: Right inguinal adenopathy (larged matted) present.  Skin:    General: Skin is warm and dry.  Neurological:     General: No focal deficit present.     Mental Status: Nicole Clayton is alert and oriented to person, place, and time.  Psychiatric:        Mood and Affect: Mood normal.        Behavior: Behavior normal.     Lab Results:  Results for orders placed or performed during the hospital encounter of 01/23/19 (from the past 24 hour(s))  Comprehensive metabolic panel     Status: Abnormal   Collection Time: 01/23/19  8:24 PM  Result Value Ref Range   Sodium 136 135 - 145 mmol/L   Potassium 4.3 3.5 - 5.1 mmol/L  Chloride 101 98 - 111 mmol/L   CO2 26 22 - 32 mmol/L   Glucose, Bld 142 (H) 70 - 99 mg/dL   BUN 22 8 - 23 mg/dL   Creatinine, Ser 1.52 (H) 0.44 - 1.00 mg/dL   Calcium 8.7 (L) 8.9 - 10.3 mg/dL   Total Protein 7.3 6.5 - 8.1 g/dL   Albumin 3.6 3.5 - 5.0 g/dL   AST 32 15 - 41 U/L   ALT 12 0 - 44 U/L   Alkaline Phosphatase 70 38 - 126 U/L   Total Bilirubin 0.4 0.3 - 1.2 mg/dL   GFR calc non Af Amer 30 (L) >60 mL/min   GFR calc Af Amer 35 (L) >60 mL/min   Anion gap 9 5 - 15  CBC with Differential     Status: None   Collection Time: 01/23/19  8:24 PM  Result Value Ref Range   WBC 8.7 4.0 - 10.5 K/uL   RBC 3.96 3.87 - 5.11 MIL/uL   Hemoglobin 12.3 12.0 - 15.0 g/dL   HCT 38.1 36.0 - 46.0 %   MCV 96.2 80.0 - 100.0 fL   MCH 31.1 26.0 - 34.0 pg   MCHC 32.3 30.0 - 36.0 g/dL   RDW 13.4 11.5 - 15.5 %   Platelets 380 150 - 400 K/uL   nRBC 0.0 0.0 - 0.2 %   Neutrophils Relative % 77 %   Neutro Abs 6.7 1.7 - 7.7 K/uL    Lymphocytes Relative 12 %   Lymphs Abs 1.1 0.7 - 4.0 K/uL   Monocytes Relative 10 %   Monocytes Absolute 0.9 0.1 - 1.0 K/uL   Eosinophils Relative 1 %   Eosinophils Absolute 0.0 0.0 - 0.5 K/uL   Basophils Relative 0 %   Basophils Absolute 0.0 0.0 - 0.1 K/uL   Immature Granulocytes 0 %   Abs Immature Granulocytes 0.02 0.00 - 0.07 K/uL  Troponin I (High Sensitivity)     Status: None   Collection Time: 01/23/19  8:24 PM  Result Value Ref Range   Troponin I (High Sensitivity) 6 <18 ng/L  Brain natriuretic peptide     Status: Abnormal   Collection Time: 01/23/19  8:24 PM  Result Value Ref Range   B Natriuretic Peptide 247.0 (H) 0.0 - 100.0 pg/mL  Troponin I (High Sensitivity)     Status: None   Collection Time: 01/23/19 10:17 PM  Result Value Ref Range   Troponin I (High Sensitivity) 4 <18 ng/L  Lactic acid, plasma     Status: None   Collection Time: 01/24/19 12:52 AM  Result Value Ref Range   Lactic Acid, Venous 1.2 0.5 - 1.9 mmol/L  Blood Culture (routine x 2)     Status: None (Preliminary result)   Collection Time: 01/24/19 12:52 AM   Specimen: BLOOD  Result Value Ref Range   Specimen Description BLOOD LEFT ANTECUBITAL    Special Requests      BOTTLES DRAWN AEROBIC AND ANAEROBIC Blood Culture adequate volume   Culture      NO GROWTH < 12 HOURS Performed at [facility] [address] [place], [place] [address]    Report Status PENDING   Blood Culture (routine x 2)     Status: None (Preliminary result)   Collection Time: 01/24/19 12:53 AM   Specimen: BLOOD  Result Value Ref Range   Specimen Description BLOOD LEFT FOREARM    Special Requests      BOTTLES DRAWN AEROBIC AND ANAEROBIC Blood Culture  adequate volume   Culture      NO GROWTH < 12 HOURS Performed at [facility], [address] [place], [place] [address]    Report Status PENDING   C-reactive protein     Status: Abnormal   Collection Time: 01/24/19 12:53 AM  Result Value Ref Range   CRP  1.7 (H) <1.0 mg/dL  Ferritin     Status: None   Collection Time: 01/24/19 12:53 AM  Result Value Ref Range   Ferritin 32 11 - 307 ng/mL  Fibrin derivatives D-Dimer     Status: Abnormal   Collection Time: 01/24/19 12:53 AM  Result Value Ref Range   Fibrin derivatives D-dimer ([facility]) 1,924.65 (H) 0.00 - 499.00 ng/mL (FEU)  Fibrinogen     Status: None   Collection Time: 01/24/19 12:53 AM  Result Value Ref Range   Fibrinogen 471 210 - 475 mg/dL  Lactate dehydrogenase     Status: Abnormal   Collection Time: 01/24/19 12:53 AM  Result Value Ref Range   LDH 279 (H) 98 - 192 U/L  Procalcitonin     Status: None   Collection Time: 01/24/19 12:53 AM  Result Value Ref Range   Procalcitonin <0.10 ng/mL  Triglycerides     Status: None   Collection Time: 01/24/19 12:53 AM  Result Value Ref Range   Triglycerides 93 <150 mg/dL  Hemoglobin A1c     Status: Abnormal   Collection Time: 01/24/19 12:53 AM  Result Value Ref Range   Hgb A1c MFr Bld 6.5 (H) 4.8 - 5.6 %   Mean Plasma Glucose 139.85 mg/dL  POC SARS Coronavirus 2 Ag     Status: None   Collection Time: 01/24/19  1:32 AM  Result Value Ref Range   SARS Coronavirus 2 Ag NEGATIVE NEGATIVE  Respiratory Panel by RT PCR (Flu A&B, Covid) - Nasopharyngeal Swab     Status: None   Collection Time: 01/24/19  3:50 AM   Specimen: Nasopharyngeal Swab  Result Value Ref Range   SARS Coronavirus 2 by RT PCR NEGATIVE NEGATIVE   Influenza A by PCR NEGATIVE NEGATIVE   Influenza B by PCR NEGATIVE NEGATIVE  Basic metabolic panel     Status: Abnormal   Collection Time: 01/24/19  5:57 AM  Result Value Ref Range   Sodium 138 135 - 145 mmol/L   Potassium 4.1 3.5 - 5.1 mmol/L   Chloride 104 98 - 111 mmol/L   CO2 26 22 - 32 mmol/L   Glucose, Bld 113 (H) 70 - 99 mg/dL   BUN 21 8 - 23 mg/dL   Creatinine, Ser 1.49 (H) 0.44 - 1.00 mg/dL   Calcium 8.5 (L) 8.9 - 10.3 mg/dL   GFR calc non Af Amer 31 (L) >60 mL/min   GFR calc Af Amer 36 (L) >60 mL/min   Anion gap  8 5 - 15  CBC     Status: Abnormal   Collection Time: 01/24/19  5:57 AM  Result Value Ref Range   WBC 7.4 4.0 - 10.5 K/uL   RBC 3.77 (L) 3.87 - 5.11 MIL/uL   Hemoglobin 11.7 (L) 12.0 - 15.0 g/dL   HCT 36.4 36.0 - 46.0 %   MCV 96.6 80.0 - 100.0 fL   MCH 31.0 26.0 - 34.0 pg   MCHC 32.1 30.0 - 36.0 g/dL   RDW 13.4 11.5 - 15.5 %   Platelets 340 150 - 400 K/uL   nRBC 0.0 0.0 - 0.2 %    BMET Recent  Labs    01/23/19 2024 01/24/19 0557  NA 136 138  K 4.3 4.1  CL 101 104  CO2 26 26  GLUCOSE 142* 113*  BUN 22 21  CREATININE 1.52* 1.49*  CALCIUM 8.7* 8.5*   PT/INR No results for input(s): LABPROT, INR in the last 72 hours. ABG No results for input(s): PHART, HCO3 in the last 72 hours.  Invalid input(s): PCO2, PO2  Studies/Results: CT ABDOMEN PELVIS WO CONTRAST  Result Date: 01/24/2019 CLINICAL DATA:  Flank pain. Stone disease suspected. History of adenocarcinoma of the fallopian tube now with sharp left flank pain and nausea. A TI and hypoxemia. Possible pneumonia on chest x-ray. EXAM: CT CHEST, ABDOMEN AND PELVIS WITHOUT CONTRAST TECHNIQUE: Multidetector CT imaging of the chest, abdomen and pelvis was performed following the standard protocol without IV contrast. COMPARISON:  CT dated 10/21/2017. FINDINGS: CT CHEST FINDINGS Cardiovascular: The heart is mildly enlarged. Aortic calcifications are noted. There is a well-positioned right-sided Port-A-Cath with tip terminating near the cavoatrial junction. There is no significant pericardial effusion. Mediastinum/Nodes: --No mediastinal or hilar lymphadenopathy. --No axillary lymphadenopathy. --No supraclavicular lymphadenopathy. --Normal thyroid gland. --there is a large hiatal hernia with the majority of the stomach herniated into the thoracic cavity. Lungs/Pleura: There metastatic pulmonary nodules throughout the lung. The largest is located in the right lower lobe and measures approximately 2.2 cm. These were not present on the patient's  CT chest from Jun 10, 2017. There is atelectasis at the lung bases. There are trace bilateral pleural effusions. There is no pneumothorax. The trachea is unremarkable. Musculoskeletal: No chest wall abnormality. No acute or significant osseous findings. CT ABDOMEN PELVIS FINDINGS Hepatobiliary: There is a 2.4 cm hypoattenuating mass in the right hepatic lobe. The left hepatic lobe appears somewhat atrophic. This is not significantly changed from prior study. Status post cholecystectomy.There is biliary ductal dilatation involving the common bile duct, not significantly changed from prior study. Pancreas: Normal contours without ductal dilatation. No peripancreatic fluid collection. Spleen: No splenic laceration or hematoma. Adrenals/Urinary Tract: --Adrenal glands: No adrenal hemorrhage. --Right kidney/ureter: No hydronephrosis or perinephric hematoma. --Left kidney/ureter: There is moderate to severe left-sided hydroureteronephrosis to the level of the urinary bladder. This is felt to be secondary to an obstructing mass in the distal left ureter (axial series 2, image 77). --Urinary bladder: The bladder is moderately distended. Stomach/Bowel: --Stomach/Duodenum: The majority of the stomach is herniated into the thoracic cavity. --Small bowel: No dilatation or inflammation. --Colon: There is a large amount of stool in the colon. There is sigmoid diverticulosis without CT evidence for diverticulitis. --Appendix: Not visualized. No right lower quadrant inflammation or free fluid. Vascular/Lymphatic: Atherosclerotic calcification is present within the non-aneurysmal abdominal aorta, without hemodynamically significant stenosis. --there are mildly enlarged retroperitoneal lymph nodes. --No mesenteric lymphadenopathy. --their pathologically enlarged inguinal lymph nodes. For example there is a 1.4 cm left inguinal lymph node. Reproductive: Status post hysterectomy. No adnexal mass. There is a pessary in place. Other: No  ascites or free air. There is a large right inguinal mass measuring approximately 6.9 by 5.7 by 6.8 cm. Musculoskeletal. There is stable height loss of the L3 vertebral body. There is grade 1 anterolisthesis of L4 on L5, likely degenerative in etiology. IMPRESSION: 1. Moderate to severe left-sided hydroureteronephrosis to the level of the urinary bladder, felt to be secondary to an obstructing mass in the distal left ureter. 2. Interval development of metastatic disease to the lungs and liver. 3. Large 6.9 cm right inguinal mass concerning for metastatic  disease. There are pathologically enlarged left inguinal lymph nodes. 4. Moderately distended urinary bladder. 5. Large hiatal hernia with the majority of the stomach herniated into the thoracic cavity. 6. Trace bilateral pleural effusions with adjacent atelectasis. 7. Sigmoid diverticulosis without CT evidence for diverticulitis. Aortic Atherosclerosis (ICD10-I70.0). Electronically Signed   By: Constance Holster M.D.   On: 01/24/2019 00:45   DG Chest 2 View  Result Date: 01/23/2019 CLINICAL DATA:  Hypoxia. Nausea and vomiting. Left flank pain. EXAM: CHEST - 2 VIEW COMPARISON:  03/26/2018 FINDINGS: Stable cardiomegaly. Right-sided Port-A-Cath remains in place. Large hiatal hernia is again seen. Increased opacity is seen in both lower lungs, suspicious for pneumonia. No evidence of pleural effusion. Pulmonary hyperinflation is again seen. IMPRESSION: 1. Increased opacity in both lower lungs, suspicious for pneumonia. 2. Large hiatal hernia 3. Stable cardiomegaly and pulmonary hyperinflation. Electronically Signed   By: Marlaine Hind M.D.   On: 01/23/2019 21:00   CT Chest Wo Contrast  Result Date: 01/24/2019 CLINICAL DATA:  Flank pain. Stone disease suspected. History of adenocarcinoma of the fallopian tube now with sharp left flank pain and nausea. A TI and hypoxemia. Possible pneumonia on chest x-ray. EXAM: CT CHEST, ABDOMEN AND PELVIS WITHOUT CONTRAST  TECHNIQUE: Multidetector CT imaging of the chest, abdomen and pelvis was performed following the standard protocol without IV contrast. COMPARISON:  CT dated 10/21/2017. FINDINGS: CT CHEST FINDINGS Cardiovascular: The heart is mildly enlarged. Aortic calcifications are noted. There is a well-positioned right-sided Port-A-Cath with tip terminating near the cavoatrial junction. There is no significant pericardial effusion. Mediastinum/Nodes: --No mediastinal or hilar lymphadenopathy. --No axillary lymphadenopathy. --No supraclavicular lymphadenopathy. --Normal thyroid gland. --there is a large hiatal hernia with the majority of the stomach herniated into the thoracic cavity. Lungs/Pleura: There metastatic pulmonary nodules throughout the lung. The largest is located in the right lower lobe and measures approximately 2.2 cm. These were not present on the patient's CT chest from Jun 10, 2017. There is atelectasis at the lung bases. There are trace bilateral pleural effusions. There is no pneumothorax. The trachea is unremarkable. Musculoskeletal: No chest wall abnormality. No acute or significant osseous findings. CT ABDOMEN PELVIS FINDINGS Hepatobiliary: There is a 2.4 cm hypoattenuating mass in the right hepatic lobe. The left hepatic lobe appears somewhat atrophic. This is not significantly changed from prior study. Status post cholecystectomy.There is biliary ductal dilatation involving the common bile duct, not significantly changed from prior study. Pancreas: Normal contours without ductal dilatation. No peripancreatic fluid collection. Spleen: No splenic laceration or hematoma. Adrenals/Urinary Tract: --Adrenal glands: No adrenal hemorrhage. --Right kidney/ureter: No hydronephrosis or perinephric hematoma. --Left kidney/ureter: There is moderate to severe left-sided hydroureteronephrosis to the level of the urinary bladder. This is felt to be secondary to an obstructing mass in the distal left ureter (axial series  2, image 77). --Urinary bladder: The bladder is moderately distended. Stomach/Bowel: --Stomach/Duodenum: The majority of the stomach is herniated into the thoracic cavity. --Small bowel: No dilatation or inflammation. --Colon: There is a large amount of stool in the colon. There is sigmoid diverticulosis without CT evidence for diverticulitis. --Appendix: Not visualized. No right lower quadrant inflammation or free fluid. Vascular/Lymphatic: Atherosclerotic calcification is present within the non-aneurysmal abdominal aorta, without hemodynamically significant stenosis. --there are mildly enlarged retroperitoneal lymph nodes. --No mesenteric lymphadenopathy. --their pathologically enlarged inguinal lymph nodes. For example there is a 1.4 cm left inguinal lymph node. Reproductive: Status post hysterectomy. No adnexal mass. There is a pessary in place. Other: No ascites or free  air. There is a large right inguinal mass measuring approximately 6.9 by 5.7 by 6.8 cm. Musculoskeletal. There is stable height loss of the L3 vertebral body. There is grade 1 anterolisthesis of L4 on L5, likely degenerative in etiology. IMPRESSION: 1. Moderate to severe left-sided hydroureteronephrosis to the level of the urinary bladder, felt to be secondary to an obstructing mass in the distal left ureter. 2. Interval development of metastatic disease to the lungs and liver. 3. Large 6.9 cm right inguinal mass concerning for metastatic disease. There are pathologically enlarged left inguinal lymph nodes. 4. Moderately distended urinary bladder. 5. Large hiatal hernia with the majority of the stomach herniated into the thoracic cavity. 6. Trace bilateral pleural effusions with adjacent atelectasis. 7. Sigmoid diverticulosis without CT evidence for diverticulitis. Aortic Atherosclerosis (ICD10-I70.0). Electronically Signed   By: Constance Holster M.D.   On: 01/24/2019 00:45     Assessment/Plan: 1.  Left hydronephrosis with flank pain and  AKI with possible distal ureteral mass.   Her pain has resolved at this time and I am not sure placement of a ureteral stent would be in her best interest unless the pain recurs.  The stent can cause pain and bladder irritation that could reduce her quality of life further.  2.   Nicole Clayton has a history of recurrent UTI's and her bladder was moderately full on CT.   I will order a cath PVR and UA.   3. Chronic incontinence.   I will order a PureWick device.      CC: Dr. Hinda Kehr and Dr. Berle Mull.     Irine Seal 01/24/2019 [phone]

## 2019-01-24 NOTE — Progress Notes (Signed)
[facility] CARE NOTE Patient: Nicole Clayton [id number]   PCP: Ezequiel Kayser, MD DOB: Dec 09, 1928   DOA: 01/23/2019   DOS: 01/24/2019    Patient was admitted by my colleague Dr. Sidney Ace earlier on 01/24/2019. I have reviewed the H&P as well as assessment and plan and agree with the same. Important changes in the plan are listed below.  Plan of care: Active Problems:   Obstructive uropathy   DNR no code (do not resuscitate)   Urology felt no indication for nay procedure and pt can have diet.  Monitor for any infection.   Author: Berle Mull, MD [facility] 01/24/2019 7:09 PM   If 7PM-7AM, please contact night-coverage at [email or web address]

## 2019-01-24 NOTE — ED Notes (Signed)
Attempted to call report to [facility]. Was informed that they were working on getting a bed in the assigned room 214 and would call back for report once a bed arrived.

## 2019-01-24 NOTE — ED Notes (Signed)
ED TO INPATIENT HANDOFF REPORT  ED Nurse Name and Phone #:   Tom RN  S Name/Age/Gender Nicole Clayton 85 y.o. female Room/Bed: ED08A/ED08A  Code Status   Code Status: DNR  Home/SNF/Other Home Patient oriented to: self, place, time and situation Is this baseline? Yes   Triage Complete: Triage complete  Chief Complaint Obstructive uropathy [N13.9]  Triage Note Patient brought in by EMS from [facility]. Patient with complaint of left flank pain, nausea and vomiting that started last night. Patient was placed on O2 at 2l by ems. Per EMS patient initial oxygen saturations was 89% on room air.     Allergies Allergies  Allergen Reactions  . Benadryl [Diphenhydramine] Shortness Of Breath    Sob, rash, swelling  . Tamsulosin Swelling  . Alendronate Sodium     Other reaction(s): Other (See Comments) Dysphagia  . Duloxetine Diarrhea  . Duloxetine Hcl Diarrhea  . Risedronate Sodium Rash    Aching, dysphagia  . Lovastatin     Other reaction(s): Other (See Comments) GI upset  . Sulfa Antibiotics Rash    States she can take but leave as allergy    Level of Care/Admitting Diagnosis ED Disposition    ED Disposition Condition [facility] Area: [place] [[id number]]  Level of Care: Med-Surg [16]  Covid Evaluation: Asymptomatic Screening Protocol (No Symptoms)  Diagnosis: Obstructive uropathy [id number]  Admitting Physician: Christel Mormon [id number]  Attending Physician: Christel Mormon [id number]  Estimated length of stay: past midnight tomorrow  Certification:: I certify this patient will need inpatient services for at least 2 midnights       B Medical/Surgery History Past Medical History:  Diagnosis Date  . Bladder infection, chronic 10/19/2011  . Cancer (HCC)    Ovarian   . 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 Claiborne Rigg and Delorise Shiner Choksi Overview:  Drs. Claiborne Rigg and [employer]   .  Concussion   . GERD (gastroesophageal reflux disease)   . Lymphedema of right lower extremity   . MI (mitral incompetence) 10/27/2014   Overview:  MODERATE   . Mitral valve prolapse   . OAB (overactive bladder)   . Ovarian cancer ([place])   . Pulmonary embolism ([place])   . Tachycardia    Past Surgical History:  Procedure Laterality Date  . ABDOMINAL HYSTERECTOMY    . CHOLECYSTECTOMY    . LAPAROSCOPIC BILATERAL SALPINGO OOPHERECTOMY  2011  . ovarian cancer surgery  2011     A IV Location/Drains/Wounds Patient Lines/Drains/Airways Status   Active Line/Drains/Airways    Name:   Placement date:   Placement time:   Site:   Days:   Implanted Port Right Chest   --    --    Chest      Implanted Port Right Chest   --    --    Chest      Implanted Port Right Chest   --    --    Chest      Implanted Port Right Chest   --    --    Chest      Implanted Port 07/06/15 Right Chest   07/06/15    --    Chest   1298   Peripheral IV 01/24/19 Right Forearm   01/24/19    --    Forearm   less than 1   External Urinary Catheter   10/21/17  2130    --   460          Intake/Output Last 24 hours  Intake/Output Summary (Last 24 hours) at 01/24/2019 [id number] Last data filed at 01/24/2019 0313 Gross per 24 hour  Intake 500 ml  Output --  Net 500 ml    Labs/Imaging Results for orders placed or performed during the hospital encounter of 01/23/19 (from the past 48 hour(s))  Comprehensive metabolic panel     Status: Abnormal   Collection Time: 01/23/19  8:24 PM  Result Value Ref Range   Sodium 136 135 - 145 mmol/L   Potassium 4.3 3.5 - 5.1 mmol/L   Chloride 101 98 - 111 mmol/L   CO2 26 22 - 32 mmol/L   Glucose, Bld 142 (H) 70 - 99 mg/dL   BUN 22 8 - 23 mg/dL   Creatinine, Ser 1.52 (H) 0.44 - 1.00 mg/dL   Calcium 8.7 (L) 8.9 - 10.3 mg/dL   Total Protein 7.3 6.5 - 8.1 g/dL   Albumin 3.6 3.5 - 5.0 g/dL   AST 32 15 - 41 U/L   ALT 12 0 - 44 U/L   Alkaline Phosphatase 70 38 - 126 U/L   Total  Bilirubin 0.4 0.3 - 1.2 mg/dL   GFR calc non Af Amer 30 (L) >60 mL/min   GFR calc Af Amer 35 (L) >60 mL/min   Anion gap 9 5 - 15    Comment: Performed at [facility], [address] [place], [place] [address]  CBC with Differential     Status: None   Collection Time: 01/23/19  8:24 PM  Result Value Ref Range   WBC 8.7 4.0 - 10.5 K/uL   RBC 3.96 3.87 - 5.11 MIL/uL   Hemoglobin 12.3 12.0 - 15.0 g/dL   HCT 38.1 36.0 - 46.0 %   MCV 96.2 80.0 - 100.0 fL   MCH 31.1 26.0 - 34.0 pg   MCHC 32.3 30.0 - 36.0 g/dL   RDW 13.4 11.5 - 15.5 %   Platelets 380 150 - 400 K/uL   nRBC 0.0 0.0 - 0.2 %   Neutrophils Relative % 77 %   Neutro Abs 6.7 1.7 - 7.7 K/uL   Lymphocytes Relative 12 %   Lymphs Abs 1.1 0.7 - 4.0 K/uL   Monocytes Relative 10 %   Monocytes Absolute 0.9 0.1 - 1.0 K/uL   Eosinophils Relative 1 %   Eosinophils Absolute 0.0 0.0 - 0.5 K/uL   Basophils Relative 0 %   Basophils Absolute 0.0 0.0 - 0.1 K/uL   Immature Granulocytes 0 %   Abs Immature Granulocytes 0.02 0.00 - 0.07 K/uL    Comment: Performed at [facility], [place], [place] [address]  Troponin I (High Sensitivity)     Status: None   Collection Time: 01/23/19  8:24 PM  Result Value Ref Range   Troponin I (High Sensitivity) 6 <18 ng/L    Comment: (NOTE) Elevated high sensitivity troponin I (hsTnI) values and significant  changes across serial measurements may suggest ACS but many other  chronic and acute conditions are known to elevate hsTnI results.  Refer to the "Links" section for chest pain algorithms and additional  guidance. Performed at [facility], [address] [place], [place] [address]   Brain natriuretic peptide     Status: Abnormal   Collection Time: 01/23/19  8:24 PM  Result Value Ref Range   B Natriuretic Peptide 247.0 (H) 0.0 -  100.0 pg/mL    Comment: Performed at [facility], [place], [place] [address]  Troponin I (High  Sensitivity)     Status: None   Collection Time: 01/23/19 10:17 PM  Result Value Ref Range   Troponin I (High Sensitivity) 4 <18 ng/L    Comment: (NOTE) Elevated high sensitivity troponin I (hsTnI) values and significant  changes across serial measurements may suggest ACS but many other  chronic and acute conditions are known to elevate hsTnI results.  Refer to the "Links" section for chest pain algorithms and additional  guidance. Performed at [facility], [address] [place], [place] [address]   Lactic acid, plasma     Status: None   Collection Time: 01/24/19 12:52 AM  Result Value Ref Range   Lactic Acid, Venous 1.2 0.5 - 1.9 mmol/L    Comment: Performed at [facility], [address] [place], [place] [address]  C-reactive protein     Status: Abnormal   Collection Time: 01/24/19 12:53 AM  Result Value Ref Range   CRP 1.7 (H) <1.0 mg/dL    Comment: Performed at [place] [address] [place], [place] [address]  Ferritin     Status: None   Collection Time: 01/24/19 12:53 AM  Result Value Ref Range   Ferritin 32 11 - 307 ng/mL    Comment: Performed at [facility], [address] [place], [place] [address]  Fibrin derivatives D-Dimer     Status: Abnormal   Collection Time: 01/24/19 12:53 AM  Result Value Ref Range   Fibrin derivatives D-dimer ([facility]) 1,924.65 (H) 0.00 - 499.00 ng/mL (FEU)    Comment: (NOTE) <> Exclusion of Venous Thromboembolism (VTE) - OUTPATIENT ONLY   (Emergency Department or [place])   0-499 ng/ml (FEU): With a low to intermediate pretest probability                      for VTE this test result excludes the diagnosis                      of VTE.   >499 ng/ml (FEU) : VTE not excluded; additional work up for VTE is                      required. <> Testing on Inpatients and Evaluation of Disseminated Intravascular   Coagulation (DIC) Reference Range:   0-499 ng/ml (FEU) Performed at [facility],  [address] [place], [place] [address]   Fibrinogen     Status: None   Collection Time: 01/24/19 12:53 AM  Result Value Ref Range   Fibrinogen 471 210 - 475 mg/dL    Comment: Performed at [facility], [address] [place], [place] [address]  Lactate dehydrogenase     Status: Abnormal   Collection Time: 01/24/19 12:53 AM  Result Value Ref Range   LDH 279 (H) 98 - 192 U/L    Comment: Performed at [facility], [address] [place], [place] [address]  Procalcitonin     Status: None   Collection Time: 01/24/19 12:53 AM  Result Value Ref Range   Procalcitonin <0.10 ng/mL    Comment:        Interpretation: PCT (Procalcitonin) <= 0.5 ng/mL: Systemic infection (sepsis) is not likely. Local bacterial infection is possible. (NOTE)       Sepsis PCT Algorithm  Lower Respiratory Tract                                      Infection PCT Algorithm    ----------------------------     ----------------------------         PCT < 0.25 ng/mL                PCT < 0.10 ng/mL         Strongly encourage             Strongly discourage   discontinuation of antibiotics    initiation of antibiotics    ----------------------------     -----------------------------       PCT 0.25 - 0.50 ng/mL            PCT 0.10 - 0.25 ng/mL               OR       >80% decrease in PCT            Discourage initiation of                                            antibiotics      Encourage discontinuation           of antibiotics    ----------------------------     -----------------------------         PCT >= 0.50 ng/mL              PCT 0.26 - 0.50 ng/mL               AND        <80% decrease in PCT             Encourage initiation of                                             antibiotics       Encourage continuation           of antibiotics    ----------------------------     -----------------------------        PCT >= 0.50 ng/mL                  PCT > 0.50 ng/mL                AND         increase in PCT                  Strongly encourage                                      initiation of antibiotics    Strongly encourage escalation           of antibiotics                                     -----------------------------  PCT <= 0.25 ng/mL                                                 OR                                        > 80% decrease in PCT                                     Discontinue / Do not initiate                                             antibiotics Performed at [facility], [address] [place], [place] [address]   Triglycerides     Status: None   Collection Time: 01/24/19 12:53 AM  Result Value Ref Range   Triglycerides 93 <150 mg/dL    Comment: Performed at [facility], [place], [place] [address]  POC SARS Coronavirus 2 Ag     Status: None   Collection Time: 01/24/19  1:32 AM  Result Value Ref Range   SARS Coronavirus 2 Ag NEGATIVE NEGATIVE    Comment: (NOTE) SARS-CoV-2 antigen NOT DETECTED.  Negative results are presumptive.  Negative results do not preclude SARS-CoV-2 infection and should not be used as the sole basis for treatment or other patient management decisions, including infection  control decisions, particularly in the presence of clinical signs and  symptoms consistent with COVID-19, or in those who have been in contact with the virus.  Negative results must be combined with clinical observations, patient history, and epidemiological information. The expected result is Negative. Fact Sheet for Patients: PodPark.tn Fact Sheet for Healthcare Providers: GiftContent.is This test is not yet approved or cleared by the [place] FDA and  has been authorized for detection and/or diagnosis of SARS-CoV-2 by FDA under an Emergency Use Authorization (EUA).  This EUA will remain in  effect (meaning this test can be used) for the duration of  the COVID-19 de claration under Section 564(b)(1) of the Act, 21 U.S.C. section 360bbb-3(b)(1), unless the authorization is terminated or revoked sooner.   Respiratory Panel by RT PCR (Flu A&B, Covid) - Nasopharyngeal Swab     Status: None   Collection Time: 01/24/19  3:50 AM   Specimen: Nasopharyngeal Swab  Result Value Ref Range   SARS Coronavirus 2 by RT PCR NEGATIVE NEGATIVE    Comment: (NOTE) SARS-CoV-2 target nucleic acids are NOT DETECTED. The SARS-CoV-2 RNA is generally detectable in upper respiratoy specimens during the acute phase of infection. The lowest concentration of SARS-CoV-2 viral copies this assay can detect is 131 copies/mL. A negative result does not preclude SARS-Cov-2 infection and should not be used as the sole basis for treatment or other patient management decisions. A negative result may occur with  improper specimen collection/handling, submission of specimen other than nasopharyngeal swab, presence of viral mutation(s) within the areas targeted by this assay, and inadequate number of viral copies (<131 copies/mL). A negative result must be combined with clinical observations, patient  history, and epidemiological information. The expected result is Negative. Fact Sheet for Patients:  PinkCheek.be Fact Sheet for Healthcare Providers:  GravelBags.it This test is not yet ap proved or cleared by the [place] FDA and  has been authorized for detection and/or diagnosis of SARS-CoV-2 by FDA under an Emergency Use Authorization (EUA). This EUA will remain  in effect (meaning this test can be used) for the duration of the COVID-19 declaration under Section 564(b)(1) of the Act, 21 U.S.C. section 360bbb-3(b)(1), unless the authorization is terminated or revoked sooner.    Influenza A by PCR NEGATIVE NEGATIVE   Influenza B by PCR NEGATIVE  NEGATIVE    Comment: (NOTE) The Xpert Xpress SARS-CoV-2/FLU/RSV assay is intended as an aid in  the diagnosis of influenza from Nasopharyngeal swab specimens and  should not be used as a sole basis for treatment. Nasal washings and  aspirates are unacceptable for Xpert Xpress SARS-CoV-2/FLU/RSV  testing. Fact Sheet for Patients: PinkCheek.be Fact Sheet for Healthcare Providers: GravelBags.it This test is not yet approved or cleared by the [place] FDA and  has been authorized for detection and/or diagnosis of SARS-CoV-2 by  FDA under an Emergency Use Authorization (EUA). This EUA will remain  in effect (meaning this test can be used) for the duration of the  Covid-19 declaration under Section 564(b)(1) of the Act, 21  U.S.C. section 360bbb-3(b)(1), unless the authorization is  terminated or revoked. Performed at [facility], [address] [place], [place] [id number]   Basic metabolic panel     Status: Abnormal   Collection Time: 01/24/19  5:57 AM  Result Value Ref Range   Sodium 138 135 - 145 mmol/L   Potassium 4.1 3.5 - 5.1 mmol/L   Chloride 104 98 - 111 mmol/L   CO2 26 22 - 32 mmol/L   Glucose, Bld 113 (H) 70 - 99 mg/dL   BUN 21 8 - 23 mg/dL   Creatinine, Ser 1.49 (H) 0.44 - 1.00 mg/dL   Calcium 8.5 (L) 8.9 - 10.3 mg/dL   GFR calc non Af Amer 31 (L) >60 mL/min   GFR calc Af Amer 36 (L) >60 mL/min   Anion gap 8 5 - 15    Comment: Performed at [facility], [address] [place], [place] [address]  CBC     Status: Abnormal   Collection Time: 01/24/19  5:57 AM  Result Value Ref Range   WBC 7.4 4.0 - 10.5 K/uL   RBC 3.77 (L) 3.87 - 5.11 MIL/uL   Hemoglobin 11.7 (L) 12.0 - 15.0 g/dL   HCT 36.4 36.0 - 46.0 %   MCV 96.6 80.0 - 100.0 fL   MCH 31.0 26.0 - 34.0 pg   MCHC 32.1 30.0 - 36.0 g/dL   RDW 13.4 11.5 - 15.5 %   Platelets 340 150 - 400 K/uL   nRBC 0.0 0.0 - 0.2 %    Comment: Performed  at [facility], [address], [place], [place] [address]   CT ABDOMEN PELVIS WO CONTRAST  Result Date: 01/24/2019 CLINICAL DATA:  Flank pain. Stone disease suspected. History of adenocarcinoma of the fallopian tube now with sharp left flank pain and nausea. A TI and hypoxemia. Possible pneumonia on chest x-ray. EXAM: CT CHEST, ABDOMEN AND PELVIS WITHOUT CONTRAST TECHNIQUE: Multidetector CT imaging of the chest, abdomen and pelvis was performed following the standard protocol without IV contrast. COMPARISON:  CT dated 10/21/2017. FINDINGS: CT CHEST FINDINGS Cardiovascular: The heart is mildly enlarged.  Aortic calcifications are noted. There is a well-positioned right-sided Port-A-Cath with tip terminating near the cavoatrial junction. There is no significant pericardial effusion. Mediastinum/Nodes: --No mediastinal or hilar lymphadenopathy. --No axillary lymphadenopathy. --No supraclavicular lymphadenopathy. --Normal thyroid gland. --there is a large hiatal hernia with the majority of the stomach herniated into the thoracic cavity. Lungs/Pleura: There metastatic pulmonary nodules throughout the lung. The largest is located in the right lower lobe and measures approximately 2.2 cm. These were not present on the patient's CT chest from Jun 10, 2017. There is atelectasis at the lung bases. There are trace bilateral pleural effusions. There is no pneumothorax. The trachea is unremarkable. Musculoskeletal: No chest wall abnormality. No acute or significant osseous findings. CT ABDOMEN PELVIS FINDINGS Hepatobiliary: There is a 2.4 cm hypoattenuating mass in the right hepatic lobe. The left hepatic lobe appears somewhat atrophic. This is not significantly changed from prior study. Status post cholecystectomy.There is biliary ductal dilatation involving the common bile duct, not significantly changed from prior study. Pancreas: Normal contours without ductal dilatation. No peripancreatic fluid collection.  Spleen: No splenic laceration or hematoma. Adrenals/Urinary Tract: --Adrenal glands: No adrenal hemorrhage. --Right kidney/ureter: No hydronephrosis or perinephric hematoma. --Left kidney/ureter: There is moderate to severe left-sided hydroureteronephrosis to the level of the urinary bladder. This is felt to be secondary to an obstructing mass in the distal left ureter (axial series 2, image 77). --Urinary bladder: The bladder is moderately distended. Stomach/Bowel: --Stomach/Duodenum: The majority of the stomach is herniated into the thoracic cavity. --Small bowel: No dilatation or inflammation. --Colon: There is a large amount of stool in the colon. There is sigmoid diverticulosis without CT evidence for diverticulitis. --Appendix: Not visualized. No right lower quadrant inflammation or free fluid. Vascular/Lymphatic: Atherosclerotic calcification is present within the non-aneurysmal abdominal aorta, without hemodynamically significant stenosis. --there are mildly enlarged retroperitoneal lymph nodes. --No mesenteric lymphadenopathy. --their pathologically enlarged inguinal lymph nodes. For example there is a 1.4 cm left inguinal lymph node. Reproductive: Status post hysterectomy. No adnexal mass. There is a pessary in place. Other: No ascites or free air. There is a large right inguinal mass measuring approximately 6.9 by 5.7 by 6.8 cm. Musculoskeletal. There is stable height loss of the L3 vertebral body. There is grade 1 anterolisthesis of L4 on L5, likely degenerative in etiology. IMPRESSION: 1. Moderate to severe left-sided hydroureteronephrosis to the level of the urinary bladder, felt to be secondary to an obstructing mass in the distal left ureter. 2. Interval development of metastatic disease to the lungs and liver. 3. Large 6.9 cm right inguinal mass concerning for metastatic disease. There are pathologically enlarged left inguinal lymph nodes. 4. Moderately distended urinary bladder. 5. Large hiatal  hernia with the majority of the stomach herniated into the thoracic cavity. 6. Trace bilateral pleural effusions with adjacent atelectasis. 7. Sigmoid diverticulosis without CT evidence for diverticulitis. Aortic Atherosclerosis (ICD10-I70.0). Electronically Signed   By: Constance Holster M.D.   On: 01/24/2019 00:45   DG Chest 2 View  Result Date: 01/23/2019 CLINICAL DATA:  Hypoxia. Nausea and vomiting. Left flank pain. EXAM: CHEST - 2 VIEW COMPARISON:  03/26/2018 FINDINGS: Stable cardiomegaly. Right-sided Port-A-Cath remains in place. Large hiatal hernia is again seen. Increased opacity is seen in both lower lungs, suspicious for pneumonia. No evidence of pleural effusion. Pulmonary hyperinflation is again seen. IMPRESSION: 1. Increased opacity in both lower lungs, suspicious for pneumonia. 2. Large hiatal hernia 3. Stable cardiomegaly and pulmonary hyperinflation. Electronically Signed   By: Myles Rosenthal.D.  On: 01/23/2019 21:00   CT Chest Wo Contrast  Result Date: 01/24/2019 CLINICAL DATA:  Flank pain. Stone disease suspected. History of adenocarcinoma of the fallopian tube now with sharp left flank pain and nausea. A TI and hypoxemia. Possible pneumonia on chest x-ray. EXAM: CT CHEST, ABDOMEN AND PELVIS WITHOUT CONTRAST TECHNIQUE: Multidetector CT imaging of the chest, abdomen and pelvis was performed following the standard protocol without IV contrast. COMPARISON:  CT dated 10/21/2017. FINDINGS: CT CHEST FINDINGS Cardiovascular: The heart is mildly enlarged. Aortic calcifications are noted. There is a well-positioned right-sided Port-A-Cath with tip terminating near the cavoatrial junction. There is no significant pericardial effusion. Mediastinum/Nodes: --No mediastinal or hilar lymphadenopathy. --No axillary lymphadenopathy. --No supraclavicular lymphadenopathy. --Normal thyroid gland. --there is a large hiatal hernia with the majority of the stomach herniated into the thoracic cavity.  Lungs/Pleura: There metastatic pulmonary nodules throughout the lung. The largest is located in the right lower lobe and measures approximately 2.2 cm. These were not present on the patient's CT chest from Jun 10, 2017. There is atelectasis at the lung bases. There are trace bilateral pleural effusions. There is no pneumothorax. The trachea is unremarkable. Musculoskeletal: No chest wall abnormality. No acute or significant osseous findings. CT ABDOMEN PELVIS FINDINGS Hepatobiliary: There is a 2.4 cm hypoattenuating mass in the right hepatic lobe. The left hepatic lobe appears somewhat atrophic. This is not significantly changed from prior study. Status post cholecystectomy.There is biliary ductal dilatation involving the common bile duct, not significantly changed from prior study. Pancreas: Normal contours without ductal dilatation. No peripancreatic fluid collection. Spleen: No splenic laceration or hematoma. Adrenals/Urinary Tract: --Adrenal glands: No adrenal hemorrhage. --Right kidney/ureter: No hydronephrosis or perinephric hematoma. --Left kidney/ureter: There is moderate to severe left-sided hydroureteronephrosis to the level of the urinary bladder. This is felt to be secondary to an obstructing mass in the distal left ureter (axial series 2, image 77). --Urinary bladder: The bladder is moderately distended. Stomach/Bowel: --Stomach/Duodenum: The majority of the stomach is herniated into the thoracic cavity. --Small bowel: No dilatation or inflammation. --Colon: There is a large amount of stool in the colon. There is sigmoid diverticulosis without CT evidence for diverticulitis. --Appendix: Not visualized. No right lower quadrant inflammation or free fluid. Vascular/Lymphatic: Atherosclerotic calcification is present within the non-aneurysmal abdominal aorta, without hemodynamically significant stenosis. --there are mildly enlarged retroperitoneal lymph nodes. --No mesenteric lymphadenopathy. --their  pathologically enlarged inguinal lymph nodes. For example there is a 1.4 cm left inguinal lymph node. Reproductive: Status post hysterectomy. No adnexal mass. There is a pessary in place. Other: No ascites or free air. There is a large right inguinal mass measuring approximately 6.9 by 5.7 by 6.8 cm. Musculoskeletal. There is stable height loss of the L3 vertebral body. There is grade 1 anterolisthesis of L4 on L5, likely degenerative in etiology. IMPRESSION: 1. Moderate to severe left-sided hydroureteronephrosis to the level of the urinary bladder, felt to be secondary to an obstructing mass in the distal left ureter. 2. Interval development of metastatic disease to the lungs and liver. 3. Large 6.9 cm right inguinal mass concerning for metastatic disease. There are pathologically enlarged left inguinal lymph nodes. 4. Moderately distended urinary bladder. 5. Large hiatal hernia with the majority of the stomach herniated into the thoracic cavity. 6. Trace bilateral pleural effusions with adjacent atelectasis. 7. Sigmoid diverticulosis without CT evidence for diverticulitis. Aortic Atherosclerosis (ICD10-I70.0). Electronically Signed   By: Constance Holster M.D.   On: 01/24/2019 00:45    Pending Labs [employer] (  From admission, onward)    Start     Ordered   01/24/19 0424  Hemoglobin A1c  Once,   STAT    Comments: To assess prior glycemic control    01/24/19 0424   01/23/19 2333  Blood Culture (routine x 2)  BLOOD CULTURE X 2,   STAT     01/23/19 2336   01/23/19 2021  Urinalysis, Complete w Microscopic  Once,   STAT     01/23/19 2020          Vitals/Pain Today's Vitals   01/24/19 0430 01/24/19 0500 01/24/19 0530 01/24/19 0600  BP: (!) 96/52 (!) 100/56 106/67   Pulse: 84 71 89   Resp: 18 (!) 24 (!) 26 18  Temp:      TempSrc:      SpO2: 92% 100% 100%   Weight:      Height:      PainSc:        Isolation Precautions No active isolations  Medications Medications  metoprolol  succinate (TOPROL-XL) 24 hr tablet 50 mg (has no administration in time range)  docusate sodium (COLACE) capsule 100 mg (has no administration in time range)  pantoprazole (PROTONIX) EC tablet 40 mg (has no administration in time range)  oxybutynin (DITROPAN-XL) 24 hr tablet 10 mg (has no administration in time range)  gabapentin (NEURONTIN) capsule 300 mg (has no administration in time range)  gabapentin (NEURONTIN) tablet 600 mg (has no administration in time range)  cholecalciferol (VITAMIN D3) tablet 1,000 Units (has no administration in time range)  multivitamin with minerals tablet 1 tablet (has no administration in time range)  albuterol (PROVENTIL) (2.5 MG/3ML) 0.083% nebulizer solution 2.5 mg (has no administration in time range)  loratadine (CLARITIN) tablet 10 mg (has no administration in time range)  fluticasone (FLONASE) 50 MCG/ACT nasal spray 2 spray (has no administration in time range)  enoxaparin (LOVENOX) injection 30 mg (has no administration in time range)  0.9 %  sodium chloride infusion ( Intravenous New Bag/Given 01/24/19 0407)  acetaminophen (TYLENOL) tablet 650 mg (has no administration in time range)    Or  acetaminophen (TYLENOL) suppository 650 mg (has no administration in time range)  traZODone (DESYREL) tablet 25 mg (has no administration in time range)  magnesium hydroxide (MILK OF MAGNESIA) suspension 30 mL (has no administration in time range)  ondansetron (ZOFRAN) tablet 4 mg (has no administration in time range)    Or  ondansetron (ZOFRAN) injection 4 mg (has no administration in time range)  oxyCODONE (Oxy IR/ROXICODONE) immediate release tablet 5 mg (has no administration in time range)  morphine 2 MG/ML injection 2 mg (has no administration in time range)  insulin aspart (novoLOG) injection 0-9 Units (has no administration in time range)  sodium chloride 0.9 % bolus 500 mL (0 mLs Intravenous Stopped 01/24/19 0313)    Mobility walks with device      Focused Assessments Cardiac Assessment Handoff:  Cardiac Rhythm: Normal sinus rhythm Lab Results  Component Value Date   TROPONINI <0.03 10/21/2017   No results found for: DDIMER Does the Patient currently have chest pain? No     R Recommendations: See Admitting Provider Note  Report given to:   Additional Notes:

## 2019-01-24 NOTE — H&P (Signed)
[facility] [place] NAME: Nicole Clayton    MR#:  [id number]  DATE OF BIRTH:  02-01-1929  DATE OF ADMISSION:  01/23/2019  PRIMARY CARE PHYSICIAN: Ezequiel Kayser, MD   REQUESTING/REFERRING PHYSICIAN: Hinda Kehr, MD  CHIEF COMPLAINT:   Chief Complaint  Patient presents with  . Flank Pain    HISTORY OF PRESENT ILLNESS:  Nicole Clayton  is a 85 y.o. Caucasian female with a known history of ovarian and fallopian tube cancer, mitral valve prolapse, GERD, DVT and PE, previously on Xarelto, who presented to the emergency room with acute onset of severe left flank pain which has been going on intermittently over the last couple weeks and described as sharp and occasionally stabbing.  She denies any fever or chills.  No cough or wheezing or hemoptysis.  She admits to nausea without vomiting or anterior abdominal pain.  She had urinary frequency without dysuria or hematuria or flank pain.  She has not been having much appetite for the last day.  She has been having increasing dyspnea, mainly on exertion over the last few days.  No loss of taste or smell.  Upon presentation to the emergency room, blood pressure was 146/70 with otherwise normal vital signs.  Pulse oximetry has dropped to 89% on room air with normal pulse oximetry 97 to 99% on 2 L of O2 by nasal cannula.  Labs revealed a BUN of 22 and a creatinine of 1.52 up from 0.7 previously, LDH of 279 and ferritin of 32 with lactic acid 1.2, procalcitonin less than 0.1 and high-sensitivity troponin I of 6 and later 4.  Fibrin derivatives D-dimer was elevated at 1924.65 with fibrinogen of 471.  Two-view chest x-ray showed increased opacity in both lower lung suspicious for pneumonia with a large hiatal hernia and stable cardiomegaly with pulmonary hyperinflation.  Noncontrasted chest CT however showed evidence of interval development of metastatic disease to the lungs and liver and trace bilateral pleural effusions with adjacent with  atelectasis.  Abdominal pelvic CT scan showed moderate to severe left-sided hydroureteronephrosis to the level of the urinary bladder that is felt to be secondary to an obstructing mass at the distal left ureter and a large 6.9 cm right inguinal mass concerning for metastatic disease likely pathologically enlarged left inguinal lymph nodes, moderately distended urinary bladder and large hiatal hernia with the majority of the stomach herniated into the thoracic cavity.  It also showed sigmoid diverticulosis without diverticulitis.  The patient was given 500 female IV normal saline bolus.  Contact was made with Dr. Jeffie Pollock who will evaluate the patient this a.m. for cystoureteroscopy and left ureteral stent placement.  The patient will be admitted to a medical monitor bed for further evaluation and management. PAST MEDICAL HISTORY:   Past Medical History:  Diagnosis Date  . Bladder infection, chronic 10/19/2011  . Cancer (HCC)    Ovarian   . 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 Claiborne Rigg and Delorise Shiner Choksi Overview:  Drs. Claiborne Rigg and [employer]   . Concussion   . GERD (gastroesophageal reflux disease)   . Lymphedema of right lower extremity   . MI (mitral incompetence) 10/27/2014   Overview:  MODERATE   . Mitral valve prolapse   . OAB (overactive bladder)   . Ovarian cancer ([place])   . Pulmonary embolism ([place])   . Tachycardia     PAST SURGICAL HISTORY:   Past Surgical  History:  Procedure Laterality Date  . ABDOMINAL HYSTERECTOMY    . CHOLECYSTECTOMY    . LAPAROSCOPIC BILATERAL SALPINGO OOPHERECTOMY  2011  . ovarian cancer surgery  2011    SOCIAL HISTORY:   Social History   Tobacco Use  . Smoking status: Never Smoker  . Smokeless tobacco: Never Used  Substance Use Topics  . Alcohol use: No    FAMILY HISTORY:   Family History  Problem Relation Age of Onset  . Ovarian cancer Other   . Breast cancer Neg Hx   . Colon cancer  Neg Hx   . Diabetes Neg Hx   . Heart disease Neg Hx   . Kidney disease Neg Hx   . Bladder Cancer Neg Hx     DRUG ALLERGIES:   Allergies  Allergen Reactions  . Benadryl [Diphenhydramine] Shortness Of Breath    Sob, rash, swelling  . Tamsulosin Swelling  . Alendronate Sodium     Other reaction(s): Other (See Comments) Dysphagia  . Duloxetine Diarrhea  . Duloxetine Hcl Diarrhea  . Risedronate Sodium Rash    Aching, dysphagia  . Lovastatin     Other reaction(s): Other (See Comments) GI upset  . Sulfa Antibiotics Rash    States she can take but leave as allergy    REVIEW OF SYSTEMS:   ROS As per history of present illness. All pertinent systems were reviewed above. Constitutional,  HEENT, cardiovascular, respiratory, GI, GU, musculoskeletal, neuro, psychiatric, endocrine,  integumentary and hematologic systems were reviewed and are otherwise  negative/unremarkable except for positive findings mentioned above in the HPI.   MEDICATIONS AT HOME:   Prior to Admission medications   Medication Sig Start Date End Date Taking? Authorizing Provider  acetaminophen (TYLENOL) 650 MG CR tablet Take 650 mg by mouth 2 (two) times daily as needed for pain.     [provider]  acetaminophen-codeine (TYLENOL #3) 300-30 MG tablet Take 1 tablet by mouth every 4 (four) hours as needed for moderate pain or severe pain. Not to exceed 4000 mg of acetaminophen in 24 hours. Patient not taking: Reported on 10/22/2018 06/12/18   Verlon Au, NP  albuterol (PROVENTIL HFA;VENTOLIN HFA) 108 (90 Base) MCG/ACT inhaler Inhale 2 puffs into the lungs every 6 (six) hours as needed for wheezing or shortness of breath. 02/27/18   Jacquelin Hawking, NP  benzonatate (TESSALON) 200 MG capsule Take 1 capsule by mouth 3 (three) times daily. 03/05/18   [provider]  calcium carbonate (TUMS - DOSED IN MG ELEMENTAL CALCIUM) 500 MG chewable tablet Chew 2 tablets by mouth every 2 (two) hours as needed  for indigestion or heartburn.    [provider]  cetirizine (ZYRTEC) 10 MG tablet Take 10 mg by mouth daily.     [provider]  chlorpheniramine-HYDROcodone (TUSSIONEX) 10-8 MG/5ML SUER Take 5 mLs by mouth at bedtime as needed for cough. Patient not taking: Reported on 10/22/2018 03/18/18   [name], MD  cholecalciferol (VITAMIN D) 1000 units tablet Take 1,000 Units by mouth daily.     [provider]  conjugated estrogens (PREMARIN) vaginal cream Apply 0.5 grams twice weekly (with applicator if needed). Patient not taking: Reported on 10/15/2018 09/11/18   Rubie Maid, MD  docusate sodium (COLACE) 100 MG capsule Take 100 mg by mouth daily as needed for mild constipation.     [provider]  fluticasone (FLONASE) 50 MCG/ACT nasal spray Place 2 sprays into both nostrils daily.     [provider]  furosemide (LASIX) 20 MG tablet Take 1 tablet (20 mg total) by mouth daily. 03/18/18   [name], MD  gabapentin (NEURONTIN) 300 MG capsule TAKE 1 CAPSULE BY MOUTH AT BEDTIME 08/14/18   [name], MD  gabapentin (NEURONTIN) 600 MG tablet Take 600 mg by mouth every morning.    [provider]  loperamide (IMODIUM) 2 MG capsule Take 2 mg by mouth as needed for diarrhea or loose stools. Do not exceed 4 capsules in a 24 hour period.    [provider]  metoprolol succinate (TOPROL-XL) 50 MG 24 hr tablet Take 50 mg by mouth daily.  05/17/17   [provider]  Multiple Vitamin (MULTIVITAMIN WITH MINERALS) TABS tablet Take 1 tablet by mouth daily.    [provider]  nitrofurantoin, macrocrystal-monohydrate, (MACROBID) 100 MG capsule Take 1 capsule (100 mg total) by mouth daily. 10/16/18   [name], MD  nystatin (MYCOSTATIN/NYSTOP) powder APPLY TOPICALLY FOUR TIMES A DAY FOR CANDIDIASIS 11/06/18   Verlon Au, NP  ondansetron (ZOFRAN) 4 MG tablet Take 4 mg by mouth every 8 (eight)  hours as needed for nausea or vomiting.    [provider]  oxybutynin (DITROPAN-XL) 10 MG 24 hr tablet TAKE 1 TABLET BY MOUTH AT BEDTIME **DO NOT CRUSH** 11/19/18   [name], MD  pantoprazole (PROTONIX) 40 MG tablet Take 40 mg by mouth daily.  10/18/11   [provider]      VITAL SIGNS:  Blood pressure 129/70, pulse 65, temperature 98.8 F (37.1 C), temperature source Oral, resp. rate 19, height 5' (1.524 m), weight 62.6 kg, SpO2 99 %.  PHYSICAL EXAMINATION:  Physical Exam  GENERAL:  85 y.o.-year-old Caucasian female patient lying in the bed with no acute distress.  EYES: Pupils equal, round, reactive to light and accommodation. No scleral icterus. Extraocular muscles intact.  HEENT: Head atraumatic, normocephalic. Oropharynx and nasopharynx clear.  NECK:  Supple, no jugular venous distention. No thyroid enlargement, no tenderness.  LUNGS: Normal but slightly diminished bibasal breath sounds , no wheezing, rales,rhonchi or crepitation. No use of accessory muscles of respiration.  CARDIOVASCULAR: Regular rate and rhythm, S1, S2 normal. No murmurs, rubs, or gallops.  ABDOMEN: Soft, nondistended, nontender. Bowel sounds present. No organomegaly or mass.  I did not appreciate any left CVA tenderness. EXTREMITIES: No pedal edema, cyanosis, or clubbing.  NEUROLOGIC: Cranial nerves II through XII are intact. Muscle strength 5/5 in all extremities. Sensation intact. Gait not checked.  PSYCHIATRIC: The patient is alert and oriented x 3.  Normal affect and good eye contact. SKIN: No obvious rash, lesion, or ulcer.   LABORATORY PANEL:   CBC Recent Labs  Lab 01/23/19 2024  WBC 8.7  HGB 12.3  HCT 38.1  PLT 380   ------------------------------------------------------------------------------------------------------------------  Chemistries  Recent Labs  Lab 01/23/19 2024  NA 136  K 4.3  CL 101  CO2 26  GLUCOSE 142*  BUN 22  CREATININE 1.52*  CALCIUM  8.7*  AST 32  ALT 12  ALKPHOS 70  BILITOT 0.4   ------------------------------------------------------------------------------------------------------------------  Cardiac Enzymes No results for input(s): TROPONINI in the last 168 hours. ------------------------------------------------------------------------------------------------------------------  RADIOLOGY:  CT ABDOMEN PELVIS WO CONTRAST  Result Date: 01/24/2019 CLINICAL DATA:  Flank pain. Stone disease suspected. History of adenocarcinoma of the fallopian tube now with sharp left flank pain and nausea. A TI and hypoxemia. Possible pneumonia on chest x-ray. EXAM: CT CHEST, ABDOMEN AND PELVIS WITHOUT CONTRAST TECHNIQUE: Multidetector  CT imaging of the chest, abdomen and pelvis was performed following the standard protocol without IV contrast. COMPARISON:  CT dated 10/21/2017. FINDINGS: CT CHEST FINDINGS Cardiovascular: The heart is mildly enlarged. Aortic calcifications are noted. There is a well-positioned right-sided Port-A-Cath with tip terminating near the cavoatrial junction. There is no significant pericardial effusion. Mediastinum/Nodes: --No mediastinal or hilar lymphadenopathy. --No axillary lymphadenopathy. --No supraclavicular lymphadenopathy. --Normal thyroid gland. --there is a large hiatal hernia with the majority of the stomach herniated into the thoracic cavity. Lungs/Pleura: There metastatic pulmonary nodules throughout the lung. The largest is located in the right lower lobe and measures approximately 2.2 cm. These were not present on the patient's CT chest from Jun 10, 2017. There is atelectasis at the lung bases. There are trace bilateral pleural effusions. There is no pneumothorax. The trachea is unremarkable. Musculoskeletal: No chest wall abnormality. No acute or significant osseous findings. CT ABDOMEN PELVIS FINDINGS Hepatobiliary: There is a 2.4 cm hypoattenuating mass in the right hepatic lobe. The left hepatic lobe  appears somewhat atrophic. This is not significantly changed from prior study. Status post cholecystectomy.There is biliary ductal dilatation involving the common bile duct, not significantly changed from prior study. Pancreas: Normal contours without ductal dilatation. No peripancreatic fluid collection. Spleen: No splenic laceration or hematoma. Adrenals/Urinary Tract: --Adrenal glands: No adrenal hemorrhage. --Right kidney/ureter: No hydronephrosis or perinephric hematoma. --Left kidney/ureter: There is moderate to severe left-sided hydroureteronephrosis to the level of the urinary bladder. This is felt to be secondary to an obstructing mass in the distal left ureter (axial series 2, image 77). --Urinary bladder: The bladder is moderately distended. Stomach/Bowel: --Stomach/Duodenum: The majority of the stomach is herniated into the thoracic cavity. --Small bowel: No dilatation or inflammation. --Colon: There is a large amount of stool in the colon. There is sigmoid diverticulosis without CT evidence for diverticulitis. --Appendix: Not visualized. No right lower quadrant inflammation or free fluid. Vascular/Lymphatic: Atherosclerotic calcification is present within the non-aneurysmal abdominal aorta, without hemodynamically significant stenosis. --there are mildly enlarged retroperitoneal lymph nodes. --No mesenteric lymphadenopathy. --their pathologically enlarged inguinal lymph nodes. For example there is a 1.4 cm left inguinal lymph node. Reproductive: Status post hysterectomy. No adnexal mass. There is a pessary in place. Other: No ascites or free air. There is a large right inguinal mass measuring approximately 6.9 by 5.7 by 6.8 cm. Musculoskeletal. There is stable height loss of the L3 vertebral body. There is grade 1 anterolisthesis of L4 on L5, likely degenerative in etiology. IMPRESSION: 1. Moderate to severe left-sided hydroureteronephrosis to the level of the urinary bladder, felt to be secondary to an  obstructing mass in the distal left ureter. 2. Interval development of metastatic disease to the lungs and liver. 3. Large 6.9 cm right inguinal mass concerning for metastatic disease. There are pathologically enlarged left inguinal lymph nodes. 4. Moderately distended urinary bladder. 5. Large hiatal hernia with the majority of the stomach herniated into the thoracic cavity. 6. Trace bilateral pleural effusions with adjacent atelectasis. 7. Sigmoid diverticulosis without CT evidence for diverticulitis. Aortic Atherosclerosis (ICD10-I70.0). Electronically Signed   By: Constance Holster M.D.   On: 01/24/2019 00:45   DG Chest 2 View  Result Date: 01/23/2019 CLINICAL DATA:  Hypoxia. Nausea and vomiting. Left flank pain. EXAM: CHEST - 2 VIEW COMPARISON:  03/26/2018 FINDINGS: Stable cardiomegaly. Right-sided Port-A-Cath remains in place. Large hiatal hernia is again seen. Increased opacity is seen in both lower lungs, suspicious for pneumonia. No evidence of pleural effusion. Pulmonary hyperinflation is again  seen. IMPRESSION: 1. Increased opacity in both lower lungs, suspicious for pneumonia. 2. Large hiatal hernia 3. Stable cardiomegaly and pulmonary hyperinflation. Electronically Signed   By: Marlaine Hind M.D.   On: 01/23/2019 21:00   CT Chest Wo Contrast  Result Date: 01/24/2019 CLINICAL DATA:  Flank pain. Stone disease suspected. History of adenocarcinoma of the fallopian tube now with sharp left flank pain and nausea. A TI and hypoxemia. Possible pneumonia on chest x-ray. EXAM: CT CHEST, ABDOMEN AND PELVIS WITHOUT CONTRAST TECHNIQUE: Multidetector CT imaging of the chest, abdomen and pelvis was performed following the standard protocol without IV contrast. COMPARISON:  CT dated 10/21/2017. FINDINGS: CT CHEST FINDINGS Cardiovascular: The heart is mildly enlarged. Aortic calcifications are noted. There is a well-positioned right-sided Port-A-Cath with tip terminating near the cavoatrial junction. There is  no significant pericardial effusion. Mediastinum/Nodes: --No mediastinal or hilar lymphadenopathy. --No axillary lymphadenopathy. --No supraclavicular lymphadenopathy. --Normal thyroid gland. --there is a large hiatal hernia with the majority of the stomach herniated into the thoracic cavity. Lungs/Pleura: There metastatic pulmonary nodules throughout the lung. The largest is located in the right lower lobe and measures approximately 2.2 cm. These were not present on the patient's CT chest from Jun 10, 2017. There is atelectasis at the lung bases. There are trace bilateral pleural effusions. There is no pneumothorax. The trachea is unremarkable. Musculoskeletal: No chest wall abnormality. No acute or significant osseous findings. CT ABDOMEN PELVIS FINDINGS Hepatobiliary: There is a 2.4 cm hypoattenuating mass in the right hepatic lobe. The left hepatic lobe appears somewhat atrophic. This is not significantly changed from prior study. Status post cholecystectomy.There is biliary ductal dilatation involving the common bile duct, not significantly changed from prior study. Pancreas: Normal contours without ductal dilatation. No peripancreatic fluid collection. Spleen: No splenic laceration or hematoma. Adrenals/Urinary Tract: --Adrenal glands: No adrenal hemorrhage. --Right kidney/ureter: No hydronephrosis or perinephric hematoma. --Left kidney/ureter: There is moderate to severe left-sided hydroureteronephrosis to the level of the urinary bladder. This is felt to be secondary to an obstructing mass in the distal left ureter (axial series 2, image 77). --Urinary bladder: The bladder is moderately distended. Stomach/Bowel: --Stomach/Duodenum: The majority of the stomach is herniated into the thoracic cavity. --Small bowel: No dilatation or inflammation. --Colon: There is a large amount of stool in the colon. There is sigmoid diverticulosis without CT evidence for diverticulitis. --Appendix: Not visualized. No right  lower quadrant inflammation or free fluid. Vascular/Lymphatic: Atherosclerotic calcification is present within the non-aneurysmal abdominal aorta, without hemodynamically significant stenosis. --there are mildly enlarged retroperitoneal lymph nodes. --No mesenteric lymphadenopathy. --their pathologically enlarged inguinal lymph nodes. For example there is a 1.4 cm left inguinal lymph node. Reproductive: Status post hysterectomy. No adnexal mass. There is a pessary in place. Other: No ascites or free air. There is a large right inguinal mass measuring approximately 6.9 by 5.7 by 6.8 cm. Musculoskeletal. There is stable height loss of the L3 vertebral body. There is grade 1 anterolisthesis of L4 on L5, likely degenerative in etiology. IMPRESSION: 1. Moderate to severe left-sided hydroureteronephrosis to the level of the urinary bladder, felt to be secondary to an obstructing mass in the distal left ureter. 2. Interval development of metastatic disease to the lungs and liver. 3. Large 6.9 cm right inguinal mass concerning for metastatic disease. There are pathologically enlarged left inguinal lymph nodes. 4. Moderately distended urinary bladder. 5. Large hiatal hernia with the majority of the stomach herniated into the thoracic cavity. 6. Trace bilateral pleural effusions with adjacent  atelectasis. 7. Sigmoid diverticulosis without CT evidence for diverticulitis. Aortic Atherosclerosis (ICD10-I70.0). Electronically Signed   By: Constance Holster M.D.   On: 01/24/2019 00:45      IMPRESSION AND PLAN:   1.  Obstructive uropathy with left hydroureteronephrosis likely secondary to distal left ureteral mass.  The patient will be admitted to a medically monitored bed.  She will be placed on hydration with IV normal saline.  Pain management will be provided.  A urology consultation will be obtained by Dr. Jeffie Pollock.  I notified him regarding the patient.  She will likely need a left ureteral stent placement.  2.  Acute  kidney injury secondary to above.  We will hydrate with IV normal saline and follow BMP.  Management otherwise as above.  3.  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  O2 protocol will be followed.  Pain management will be provided.  4. Type 2 diabetes mellitus.  The patient will be placed on supplemental coverage with NovoLog.  5.  DVT prophylaxis.  Subcutaneous Lovenox.  All the records are reviewed and case discussed with ED provider. The plan of care was discussed in details with the patient (and family). I answered all questions. The patient agreed to proceed with the above mentioned plan. Further management will depend upon hospital course.   CODE STATUS: Full code  TOTAL TIME TAKING CARE OF THIS PATIENT: 50 minutes.    Christel Mormon M.D on 01/24/2019 at 2:55 AM  [facility]   From 7 PM-7 AM, contact night-coverage [email or web address]  CC: Primary care physician; Ezequiel Kayser, MD   Note: This dictation was prepared with Dragon dictation along with smaller phrase technology. Any transcriptional errors that result from this process are unintentional.

## 2019-01-25 ENCOUNTER — Inpatient Hospital Stay: Payer: Medicare Other

## 2019-01-25 DIAGNOSIS — N132 Hydronephrosis with renal and ureteral calculous obstruction: Secondary | ICD-10-CM

## 2019-01-25 DIAGNOSIS — Z515 Encounter for palliative care: Secondary | ICD-10-CM

## 2019-01-25 LAB — URINE CULTURE: Culture: >=100000 — AB

## 2019-01-25 LAB — BASIC METABOLIC PANEL
Anion gap: 8 (ref 5–15)
BUN: 23 mg/dL (ref 8–23)
CO2: 25 mmol/L (ref 22–32)
Calcium: 8.2 mg/dL — ABNORMAL LOW (ref 8.9–10.3)
Chloride: 103 mmol/L (ref 98–111)
Creatinine, Ser: 1.43 mg/dL — ABNORMAL HIGH (ref 0.44–1.00)
GFR calc Af Amer: 38 mL/min — ABNORMAL LOW (ref >60)
GFR calc non Af Amer: 33 mL/min — ABNORMAL LOW (ref >60)
Glucose, Bld: 143 mg/dL — ABNORMAL HIGH (ref 70–99)
Potassium: 4 mmol/L (ref 3.5–5.1)
Sodium: 136 mmol/L (ref 135–145)

## 2019-01-25 LAB — BLOOD CULTURE ID PANEL (REFLEXED)

## 2019-01-25 LAB — BRAIN NATRIURETIC PEPTIDE: B Natriuretic Peptide: 466 pg/mL — ABNORMAL HIGH (ref 0.0–100.0)

## 2019-01-25 LAB — PROCALCITONIN: Procalcitonin: <0.1 ng/mL

## 2019-01-25 LAB — MAGNESIUM: Magnesium: 1.8 mg/dL (ref 1.7–2.4)

## 2019-01-25 LAB — GLUCOSE, CAPILLARY: Glucose-Capillary: 86 mg/dL (ref 70–99)

## 2019-01-25 MED ORDER — LACTULOSE 10 GM/15ML PO SOLN
20.0000 g | Freq: Every day | ORAL | Status: DC
  Administered 2019-01-25 – 2019-01-27 (×3): 20 g via ORAL
  Filled 2019-01-25 (×3): qty 30

## 2019-01-25 MED ORDER — DOCUSATE SODIUM 100 MG PO CAPS
100.0000 mg | ORAL_CAPSULE | Freq: Two times a day (BID) | ORAL | Status: DC
  Administered 2019-01-25: 11:00:00 100 mg via ORAL
  Filled 2019-01-25: qty 1

## 2019-01-25 MED ORDER — FUROSEMIDE 10 MG/ML IJ SOLN
40.0000 mg | Freq: Once | INTRAMUSCULAR | Status: AC
  Administered 2019-01-25: 11:00:00 40 mg via INTRAVENOUS
  Filled 2019-01-25: qty 4

## 2019-01-25 MED ORDER — ALBUTEROL SULFATE (2.5 MG/3ML) 0.083% IN NEBU
2.5000 mg | INHALATION_SOLUTION | RESPIRATORY_TRACT | Status: AC
  Administered 2019-01-25 (×2): 2.5 mg via RESPIRATORY_TRACT
  Filled 2019-01-25 (×3): qty 3

## 2019-01-25 MED ORDER — ZOLPIDEM TARTRATE 5 MG PO TABS: 5.0000 mg | ORAL_TABLET | Freq: Every evening | ORAL | Status: DC | PRN

## 2019-01-25 NOTE — Progress Notes (Signed)
Urology Inpatient Progress Note  Subjective: Nicole Clayton is a 85 y.o. female admitted on 01/23/2019 with left flank pain in the setting of known metastatic ovarian and fallopian tube cancer, on hospice.  Admission CT significant for left hydronephrosis with a possible distal left ureteral mass and moderately distended bladder.  Also with AKI and a history of rUTI.   Creatinine down today, 1.43.  UA with 21-50 WBCs/hpf and rare bacteria; urine culture pending.  No bladder scan data available.  Today, she denies pain.  No acute concerns.  Current Facility-Administered Medications  Medication Dose Route Frequency Provider Last Rate Last Admin  . acetaminophen (TYLENOL) tablet 650 mg  650 mg Oral Q6H PRN Mansy, Jan A, MD   650 mg at 01/24/19 1736   Or  . acetaminophen (TYLENOL) suppository 650 mg  650 mg Rectal Q6H PRN Mansy, Jan A, MD      . albuterol (PROVENTIL) (2.5 MG/3ML) 0.083% nebulizer solution 2.5 mg  2.5 mg Inhalation Q6H PRN Mansy, Jan A, MD      . albuterol (PROVENTIL) (2.5 MG/3ML) 0.083% nebulizer solution 2.5 mg  2.5 mg Inhalation Q4H Lavina Hamman, MD   2.5 mg at 01/25/19 1129  . Chlorhexidine Gluconate Cloth 2 % PADS 6 each  6 each Topical Q0600 Lavina Hamman, MD   6 each at 01/25/19 [phone]  . cholecalciferol (VITAMIN D3) tablet 1,000 Units  1,000 Units Oral Daily Mansy, Arvella Merles, MD   1,000 Units at 01/25/19 [phone]  . docusate sodium (COLACE) capsule 100 mg  100 mg Oral BID Lavina Hamman, MD   100 mg at 01/25/19 1117  . enoxaparin (LOVENOX) injection 30 mg  30 mg Subcutaneous Q24H Mansy, Jan A, MD   30 mg at 01/25/19 0636  . fluticasone (FLONASE) 50 MCG/ACT nasal spray 2 spray  2 spray Each Nare Daily Mansy, Jan A, MD      . gabapentin (NEURONTIN) capsule 300 mg  300 mg Oral QHS Mansy, Jan A, MD   300 mg at 01/24/19 2222  . gabapentin (NEURONTIN) tablet 600 mg  600 mg Oral BH-q7a Mansy, Jan A, MD   600 mg at 01/25/19 0636  . loratadine (CLARITIN) tablet 10 mg  10 mg Oral Daily  Mansy, Jan A, MD   10 mg at 01/25/19 [id number]  . magnesium hydroxide (MILK OF MAGNESIA) suspension 30 mL  30 mL Oral Daily PRN Mansy, Jan A, MD      . metoprolol succinate (TOPROL-XL) 24 hr tablet 50 mg  50 mg Oral Daily Mansy, Jan A, MD   50 mg at 01/25/19 0936  . morphine 2 MG/ML injection 2 mg  2 mg Intravenous Q4H PRN Mansy, Jan A, MD      . multivitamin with minerals tablet 1 tablet  1 tablet Oral Daily Mansy, Jan A, MD   1 tablet at 01/25/19 0936  . mupirocin ointment (BACTROBAN) 2 % 1 application  1 application Nasal BID Lavina Hamman, MD   1 application at [id number] [phone]  . ondansetron (ZOFRAN) tablet 4 mg  4 mg Oral Q6H PRN Mansy, Jan A, MD       Or  . ondansetron [facility]) injection 4 mg  4 mg Intravenous Q6H PRN Mansy, Jan A, MD      . oxybutynin (DITROPAN-XL) 24 hr tablet 10 mg  10 mg Oral QHS Mansy, Jan A, MD      . oxyCODONE (Oxy IR/ROXICODONE) immediate release tablet 5 mg  5 mg Oral  Q4H PRN Mansy, Jan A, MD      . pantoprazole (PROTONIX) EC tablet 40 mg  40 mg Oral Daily Mansy, Jan A, MD   40 mg at 01/25/19 [id number]  . traZODone (DESYREL) tablet 25 mg  25 mg Oral QHS PRN Mansy, Arvella Merles, MD       Facility-Administered Medications Ordered in Other Encounters  Medication Dose Route Frequency Provider Last Rate Last Admin  . diphenhydrAMINE (BENADRYL) injection 50 mg  50 mg Intravenous Once Choksi, Janak, MD      . sodium chloride 0.9 % injection 10 mL  10 mL Intravenous PRN Forest Gleason, MD   10 mL at 01/12/15 1116  . sodium chloride 0.9 % injection 10 mL  10 mL Intravenous PRN Choksi, Delorise Shiner, MD   10 mL at 02/16/15 1025   Objective: Vital signs in last 24 hours: Temp:  [97.5 F (36.4 C)-98.1 F (36.7 C)] 97.5 F (36.4 C) (12/21 0612) Pulse Rate:  [51-82] 82 (12/21 0936) Resp:  [20] 20 (12/21 0612) BP: (113-133)/(57-67) 133/67 (12/21 0612) SpO2:  [97 %-98 %] 97 % (12/21 1131)  Intake/Output from previous day: 12/20 0701 - 12/21 0700 In: 1140 [P.O.:240; I.V.:900] Out: 650  [Urine:650] Intake/Output this shift: Total I/O In: 240 [P.O.:240] Out: -   Physical Exam Vitals and nursing note reviewed.  Constitutional:      General: She is not in acute distress.    Appearance: She is not ill-appearing, toxic-appearing or diaphoretic.  HENT:     Head: Normocephalic and atraumatic.  Pulmonary:     Effort: Pulmonary effort is normal. No respiratory distress.  Abdominal:     Palpations: Abdomen is soft.     Tenderness: There is no abdominal tenderness.  Skin:    General: Skin is warm and dry.  Psychiatric:        Mood and Affect: Mood normal.        Behavior: Behavior normal.    Lab Results:  Recent Labs    01/23/19 2024 01/24/19 0557  WBC 8.7 7.4  HGB 12.3 11.7*  HCT 38.1 36.4  PLT 380 340   BMET Recent Labs    01/24/19 0557 01/25/19 1104  NA 138 136  K 4.1 4.0  CL 104 103  CO2 26 25  GLUCOSE 113* 143*  BUN 21 23  CREATININE 1.49* 1.43*  CALCIUM 8.5* 8.2*   Assessment & Plan: 85 year old female with metastatic ovarian and fallopian tube cancer, here with acute onset left flank pain, now resolved.  Left hydronephrosis and a moderately distended urinary bladder notable on CT.  Creatinine slightly down today.  Awaiting bladder scan data.  If greater than 500 mL, recommend Foley catheter placement for urinary retention with follow-up ultrasound in 2 days.  Debroah Loop, PA-C 01/25/2019

## 2019-01-25 NOTE — Progress Notes (Signed)
PHARMACY - PHYSICIAN COMMUNICATION CRITICAL VALUE ALERT - BLOOD CULTURE IDENTIFICATION (BCID)  Results for orders placed or performed during the hospital encounter of 01/23/19  Blood Culture ID Panel (Reflexed) (Collected: 01/24/2019 12:53 AM)  Result Value Ref Range   Enterococcus species NOT DETECTED NOT DETECTED   Listeria monocytogenes NOT DETECTED NOT DETECTED   Staphylococcus species NOT DETECTED NOT DETECTED   Staphylococcus aureus (BCID) NOT DETECTED NOT DETECTED   Streptococcus species NOT DETECTED NOT DETECTED   Streptococcus agalactiae NOT DETECTED NOT DETECTED   Streptococcus pneumoniae NOT DETECTED NOT DETECTED   Streptococcus pyogenes NOT DETECTED NOT DETECTED   Acinetobacter baumannii NOT DETECTED NOT DETECTED   Enterobacteriaceae species NOT DETECTED NOT DETECTED   Enterobacter cloacae complex NOT DETECTED NOT DETECTED   Escherichia coli NOT DETECTED NOT DETECTED   Klebsiella oxytoca NOT DETECTED NOT DETECTED   Klebsiella pneumoniae NOT DETECTED NOT DETECTED   Proteus species NOT DETECTED NOT DETECTED   Serratia marcescens NOT DETECTED NOT DETECTED   Haemophilus influenzae NOT DETECTED NOT DETECTED   Neisseria meningitidis NOT DETECTED NOT DETECTED   Pseudomonas aeruginosa NOT DETECTED NOT DETECTED   Candida albicans NOT DETECTED NOT DETECTED   Candida glabrata NOT DETECTED NOT DETECTED   Candida krusei NOT DETECTED NOT DETECTED   Candida parapsilosis NOT DETECTED NOT DETECTED   Candida tropicalis NOT DETECTED NOT DETECTED   1 of 4 bottles GRAM POSITIVE COCCOBACILLUS no species identified   Name of physician (or Provider) Contacted: Dr Berle Mull  Changes to prescribed antibiotics required: given clinical association Dr Posey Pronto is considering the result a contaminant at this time. She is currently on no antibiotics and no changes are being made  Dallie Piles 01/25/2019  9:44 AM

## 2019-01-25 NOTE — Progress Notes (Signed)
[facility] Progress Note  Patient: Nicole Clayton [id number]   PCP: Ezequiel Kayser, MD DOB: 1928-05-11   DOA: 01/23/2019   DOS: 01/25/2019   Date of Service: the patient was seen and examined on 01/25/2019  Chief Complaint  Patient presents with  . Flank Pain     Brief hospital course: Pt. with PMH of ovarian fallopian tube cancer, mitral valve prolapse, GERD, DVT and PE on Xarelto but currently on no anticoagulation; presented with complain of flank pain, was found to have hydronephrosis with stone.  On 01/25/2019 patient developed volume overload and was given IV Lasix. Later in the afternoon on 01/25/2019 patient was confused.  Currently further plan is further work-up and treatment for encephalopathy.  Subjective: Patient in the morning denies any complaints of acute chest pain or shortness of breath.  No nausea no vomiting.  Has some swelling in the leg which is chronic.  No diarrhea no constipation.  Later in the afternoon patient was confused unable to answer and follow commands.  Assessment and Plan: Scheduled Meds: . albuterol  2.5 mg Inhalation Q4H  . Chlorhexidine Gluconate Cloth  6 each Topical Q0600  . enoxaparin (LOVENOX) injection  30 mg Subcutaneous Q24H  . fluticasone  2 spray Each Nare Daily  . lactulose  20 g Oral Daily  . mupirocin ointment  1 application Nasal BID  . pantoprazole  40 mg Oral Daily   Continuous Infusions: PRN Meds: acetaminophen **OR** acetaminophen, albuterol, morphine injection, ondansetron **OR** ondansetron (ZOFRAN) IV, zolpidem  1.  Obstructive uropathy with left hydroureteronephrosis likely secondary to distal left ureteral mass.   Urology was consulted. Since the patient did not have any ongoing pain they recommended conservative measures without any intervention. In and out catheterization was performed with significant retention. Urinalysis was negative for any acute infection. We will continue to monitor.  2.  Acute kidney  injury secondary to above.   Patient was given IV hydration. Currently renal function is about the same but the patient appears to be significantly volume overloaded. Discontinue IV fluids and start the patient on IV Lasix. Monitor.  3.  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   Patient is on hospice since January 2020. Continue current medication.  4. Type 2 diabetes mellitus.   Does not appear to be on any medication outpatient. Hemoglobin A1c 6.5 thus meeting the criteria. Patient was started on sliding scale insulin. Currently given the patient's status on hospice will discontinue surgical insulin.  5.  Acute on chronic diastolic CHF. We will provide IV Lasix x1 and monitor renal function. Discontinue IV fluids.  6.  Neuropathy. Patient is on gabapentin for many years. Currently appears to be having some encephalopathy from the gabapentin with positive asterixis. We will hold the medicine and monitor.  7.  Acute metabolic encephalopathy. Likely from polypharmacy. Patient is on gabapentin suspect that this is contributing to her encephalopathy with asterixis in the setting of acute kidney injury. We will hold this medication and monitor. Also provide 1 dose of lactulose.   Diet: Cardiac diet aspiration precaution DVT Prophylaxis: Subcutaneous Lovenox   Advance goals of care discussion: DNR  Family Communication: no family was present at bedside, at the time of interview. The pt provided permission to discuss medical plan with the family. Opportunity was given to ask question and all questions were answered satisfactorily.   Disposition:  Discharge to be determined.  Consultants: Urology Procedures: none  Antibiotics:  Anti-infectives (From admission, onward)   None       Objective: Physical Exam: Vitals:   01/25/19 0612 01/25/19 0936 01/25/19 1131 01/25/19 1232  BP: 133/67   119/81  Pulse:  (!) 51 82  79  Resp: 20     Temp: (!) 97.5 F (36.4 C)     TempSrc: Oral     SpO2: 97%  97% 100%  Weight:      Height:        Intake/Output Summary (Last 24 hours) at 01/25/2019 1846 Last data filed at 01/25/2019 1443 Gross per 24 hour  Intake 240 ml  Output 1300 ml  Net -1060 ml   Filed Weights   01/23/19 2017  Weight: 62.6 kg   General: alert and not oriented to time, place, and person. Appear in moderate distress, affect anxious Eyes: PERRL, Conjunctiva normal ENT: Oral Mucosa Clear, moist  Neck: difficult to assess  JVD, no Abnormal Mass Or lumps Cardiovascular: S1 and S2 Present, no Murmur,  Respiratory: increased respiratory effort, Bilateral Air entry equal and Decreased, no signs of accessory muscle use, bilateral  Crackles, no wheezes Abdomen: Bowel Sound present, Soft and no tenderness, no hernia Skin: no rashes  Extremities: bilateral  Pedal edema, no calf tenderness Neurologic: without any new focal findings Gait not checked due to patient safety concerns  Data Reviewed: I have personally reviewed and interpreted daily labs, tele strips, imagings as discussed above. I reviewed all nursing notes, pharmacy notes, vitals, pertinent old records I have discussed plan of care as described above with RN and patient/family.  CBC: Recent Labs  Lab 01/23/19 2024 01/24/19 0557  WBC 8.7 7.4  NEUTROABS 6.7  --   HGB 12.3 11.7*  HCT 38.1 36.4  MCV 96.2 96.6  PLT 380 [id number]   Basic Metabolic Panel: Recent Labs  Lab 01/23/19 2024 01/24/19 0557 01/25/19 1104  NA 136 138 136  K 4.3 4.1 4.0  CL 101 104 103  CO2 26 26 25   GLUCOSE 142* 113* 143*  BUN 22 21 23   CREATININE 1.52* 1.49* 1.43*  CALCIUM 8.7* 8.5* 8.2*  MG  --   --  1.8    Liver Function Tests: Recent Labs  Lab 01/23/19 2024  AST 32  ALT 12  ALKPHOS 70  BILITOT 0.4  PROT 7.3  ALBUMIN 3.6   No results for input(s): LIPASE, AMYLASE in the last 168 hours. No results for input(s): AMMONIA in the  last 168 hours. Coagulation Profile: No results for input(s): INR, PROTIME in the last 168 hours. Cardiac Enzymes: No results for input(s): CKTOTAL, CKMB, CKMBINDEX, TROPONINI in the last 168 hours. BNP (last 3 results) No results for input(s): PROBNP in the last 8760 hours. CBG: Recent Labs  Lab 01/24/19 0835 01/24/19 1130 01/24/19 1706 01/24/19 2130 01/25/19 0751  GLUCAP 97 111* 85 122* 86   Studies: DG Chest Port 1 View  Result Date: 01/25/2019 CLINICAL DATA:  Shortness of breath. Acute onset of severe left flank pain. EXAM: PORTABLE CHEST 1 VIEW COMPARISON:  Chest x-ray dated 01/23/2019 and CT scan of the chest dated 01/24/2019 FINDINGS: Port-A-Cath in place with the tip in the right atrium, unchanged. Heart size is normal. New pulmonary vascular prominence, most prominent at the left lung base. Huge chronic hiatal hernia. Increased small right pleural effusion. New tiny left pleural effusion. Multiple metastatic pulmonary nodules noted bilaterally. IMPRESSION: 1. New pulmonary vascular congestion and small bilateral pleural effusions. 2. Multiple metastatic nodules in both lungs as demonstrated  on the prior CT scan. 3. Huge chronic hiatal hernia more prominent than on the prior chest x-ray. Electronically Signed   By: Lorriane Shire M.D.   On: 01/25/2019 11:53     Time spent: 35 minutes  Author: Berle Mull, MD [facility] 01/25/2019 6:46 PM  To reach On-call, see care teams to locate the attending and reach out to them via [email or web address]. If 7PM-7AM, please contact night-coverage If you still have difficulty reaching the attending provider, please page the [facility] (Director on Call) for [facility] on amion for assistance.

## 2019-01-26 LAB — BASIC METABOLIC PANEL
Anion gap: 11 (ref 5–15)
BUN: 20 mg/dL (ref 8–23)
CO2: 27 mmol/L (ref 22–32)
Calcium: 8.6 mg/dL — ABNORMAL LOW (ref 8.9–10.3)
Chloride: 98 mmol/L (ref 98–111)
Creatinine, Ser: 1.25 mg/dL — ABNORMAL HIGH (ref 0.44–1.00)
GFR calc Af Amer: 44 mL/min — ABNORMAL LOW (ref >60)
GFR calc non Af Amer: 38 mL/min — ABNORMAL LOW (ref >60)
Glucose, Bld: 126 mg/dL — ABNORMAL HIGH (ref 70–99)
Potassium: 3.5 mmol/L (ref 3.5–5.1)
Sodium: 136 mmol/L (ref 135–145)

## 2019-01-26 LAB — CBC
HCT: 37.4 % (ref 36.0–46.0)
Hemoglobin: 12.1 g/dL (ref 12.0–15.0)
MCH: 30.8 pg (ref 26.0–34.0)
MCHC: 32.4 g/dL (ref 30.0–36.0)
MCV: 95.2 fL (ref 80.0–100.0)
Platelets: 374 10*3/uL (ref 150–400)
RBC: 3.93 MIL/uL (ref 3.87–5.11)
RDW: 13.3 % (ref 11.5–15.5)
WBC: 10 10*3/uL (ref 4.0–10.5)
nRBC: 0 % (ref 0.0–0.2)

## 2019-01-26 MED ORDER — GABAPENTIN 100 MG PO CAPS
200.0000 mg | ORAL_CAPSULE | Freq: Two times a day (BID) | ORAL | Status: DC
  Administered 2019-01-26 – 2019-01-27 (×2): 200 mg via ORAL
  Filled 2019-01-26 (×2): qty 2

## 2019-01-26 MED ORDER — FLUCONAZOLE 100MG IVPB: 100.0000 mg | INTRAVENOUS | Status: DC

## 2019-01-26 MED ORDER — FLUCONAZOLE IN SODIUM CHLORIDE 200-0.9 MG/100ML-% IV SOLN
200.0000 mg | Freq: Once | INTRAVENOUS | Status: DC
  Filled 2019-01-26: qty 100

## 2019-01-26 MED ORDER — GABAPENTIN 300 MG PO CAPS
300.0000 mg | ORAL_CAPSULE | Freq: Two times a day (BID) | ORAL | Status: DC
  Administered 2019-01-26: 15:00:00 300 mg via ORAL
  Filled 2019-01-26: qty 1

## 2019-01-26 MED ORDER — FLUCONAZOLE 100 MG PO TABS
100.0000 mg | ORAL_TABLET | Freq: Once | ORAL | Status: AC
  Administered 2019-01-26: 23:00:00 100 mg via ORAL
  Filled 2019-01-26: qty 1

## 2019-01-26 MED ORDER — FLUCONAZOLE 50 MG PO TABS
50.0000 mg | ORAL_TABLET | Freq: Every day | ORAL | Status: DC
  Filled 2019-01-26: qty 1

## 2019-01-26 NOTE — Progress Notes (Signed)
Urology Inpatient Progress Note  Subjective: Nicole Clayton is a 85 y.o. female admitted on 01/23/2019 with left flank pain in the setting of known metastatic ovarian and fallopian tube cancer, on hospice.  Admission CT significant for left hydronephrosis with a possible distal left ureteral mass and moderately distended bladder.  Also with AKI and a history of rUTI.  Creatinine down today, 1.25.  Urine culture positive for yeast.  Blood cultures negative.  Per nursing, bladder scan volume 2 days ago was 764mL. She was I&O cathed with output of 653mL. She has been voiding spontaneously thereafter with PureWick in place.  She denies pain today.  No acute concerns.  Current Facility-Administered Medications  Medication Dose Route Frequency Provider Last Rate Last Admin  . acetaminophen (TYLENOL) tablet 650 mg  650 mg Oral Q6H PRN Mansy, Jan A, MD   650 mg at 01/24/19 1736   Or  . acetaminophen (TYLENOL) suppository 650 mg  650 mg Rectal Q6H PRN Mansy, Jan A, MD      . albuterol (PROVENTIL) (2.5 MG/3ML) 0.083% nebulizer solution 2.5 mg  2.5 mg Inhalation Q6H PRN Mansy, Jan A, MD      . Chlorhexidine Gluconate Cloth 2 % PADS 6 each  6 each Topical Q0600 Lavina Hamman, MD   6 each at 01/26/19 0450  . enoxaparin (LOVENOX) injection 30 mg  30 mg Subcutaneous Q24H Mansy, Jan A, MD   30 mg at 01/26/19 0729  . fluticasone (FLONASE) 50 MCG/ACT nasal spray 2 spray  2 spray Each Nare Daily Mansy, Jan A, MD      . lactulose (CHRONULAC) 10 GM/15ML solution 20 g  20 g Oral Daily Lavina Hamman, MD   20 g at 01/25/19 1744  . morphine 2 MG/ML injection 2 mg  2 mg Intravenous Q4H PRN Mansy, Jan A, MD      . mupirocin ointment (BACTROBAN) 2 % 1 application  1 application Nasal BID Lavina Hamman, MD   1 application at [id number]  . ondansetron (ZOFRAN) tablet 4 mg  4 mg Oral Q6H PRN Mansy, Jan A, MD       Or  . ondansetron [facility]) injection 4 mg  4 mg Intravenous Q6H PRN Mansy, Jan A, MD      .  pantoprazole (PROTONIX) EC tablet 40 mg  40 mg Oral Daily Mansy, Jan A, MD   40 mg at 01/25/19 [id number]  . zolpidem (AMBIEN) tablet 5 mg  5 mg Oral QHS PRN Lavina Hamman, MD       Facility-Administered Medications Ordered in Other Encounters  Medication Dose Route Frequency Provider Last Rate Last Admin  . sodium chloride 0.9 % injection 10 mL  10 mL Intravenous PRN Forest Gleason, MD   10 mL at 01/12/15 1116  . sodium chloride 0.9 % injection 10 mL  10 mL Intravenous PRN Forest Gleason, MD   10 mL at 02/16/15 1025   Objective: Vital signs in last 24 hours: Temp:  [97.6 F (36.4 C)-97.7 F (36.5 C)] 97.7 F (36.5 C) (12/22 0447) Pulse Rate:  [79-118] 118 (12/22 0447) Resp:  [20] 20 (12/22 0447) BP: (115-120)/(68-81) 115/68 (12/22 0447) SpO2:  [96 %-100 %] 96 % (12/22 0447)  Intake/Output from previous day: 12/21 0701 - 12/22 0700 In: 300 [P.O.:300] Out: 2250 [Urine:2250] Intake/Output this shift: No intake/output data recorded.  Physical Exam Vitals and nursing note reviewed.  Constitutional:      General: She is not in acute distress.  Appearance: She is not ill-appearing, toxic-appearing or diaphoretic.  HENT:     Head: Normocephalic and atraumatic.  Pulmonary:     Effort: Pulmonary effort is normal. No respiratory distress.  Abdominal:     General: There is no distension.     Palpations: Abdomen is soft.     Tenderness: There is no abdominal tenderness. There is no guarding or rebound.  Skin:    General: Skin is warm and dry.  Psychiatric:        Mood and Affect: Mood normal.        Behavior: Behavior normal.    Lab Results:  Recent Labs    01/24/19 0557 01/26/19 0609  WBC 7.4 10.0  HGB 11.7* 12.1  HCT 36.4 37.4  PLT 340 374   BMET Recent Labs    01/25/19 1104 01/26/19 0609  NA 136 136  K 4.0 3.5  CL 103 98  CO2 25 27  GLUCOSE 143* 126*  BUN 23 20  CREATININE 1.43* 1.25*  CALCIUM 8.2* 8.6*   Assessment & Plan: 85 year old female admitted with  acute left flank pain in the setting of metastatic ovarian and fallopian tube cancer, resolved without intervention.  Recommend starting Diflucan for treatment of yeast UTI. Given elevated bladder scan volumes 2 days ago, recommend repeating these at least once daily with plans for Foley catheter placement or routine I&O catheterization to keep volumes <500 mL.  Debroah Loop, PA-C 01/26/2019

## 2019-01-26 NOTE — Consult Note (Addendum)
Pharmacy Antibiotic Note  Nicole Clayton is a 85 y.o. female admitted on 01/23/2019 with UTI.  Pharmacy has been consulted for Fluconazole dosing. Patient's Urine culture positive for yeast. Per urology, recommended to initiate therapy as patient has a history of UTI and having difficulty urinating.  Plan: Fluconazole 100mg  PO x 1 followed by 50mg  PO x 4 days for 5 days of therapy total.  Dose has been renally adjusted as Patient's CrCl<59ml/min. Will continue to monitor and adjust dose as deemed necessary.  Height: 5' (152.4 cm) Weight: 138 lb (62.6 kg) IBW/kg (Calculated) : 45.5  Temp (24hrs), Avg:97.7 F (36.5 C), Min:97.3 F (36.3 C), Max:98.2 F (36.8 C)  Recent Labs  Lab 01/23/19 2024 01/24/19 0052 01/24/19 0557 01/25/19 1104 01/26/19 0609  WBC 8.7  --  7.4  --  10.0  CREATININE 1.52*  --  1.49* 1.43* 1.25*  LATICACIDVEN  --  1.2  --   --   --     Estimated Creatinine Clearance: 25.7 mL/min (A) (by C-G formula based on SCr of 1.25 mg/dL (H)).    Allergies  Allergen Reactions  . Benadryl [Diphenhydramine] Shortness Of Breath    Sob, rash, swelling  . Tamsulosin Swelling  . Alendronate Sodium     Other reaction(s): Other (See Comments) Dysphagia  . Duloxetine Diarrhea  . Duloxetine Hcl Diarrhea  . Risedronate Sodium Rash    Aching, dysphagia  . Lovastatin     Other reaction(s): Other (See Comments) GI upset  . Sulfa Antibiotics Rash    States she can take but leave as allergy    Antimicrobials this admission: Fluconazole 12/22 >>    Microbiology results: 12/20 BCx: GPC(contaminant) 12/20 UCx: yeast    Thank you for allowing pharmacy to be a part of this patient's care.  Lance Coon A Lethia Donlon 01/26/2019 8:11 PM

## 2019-01-26 NOTE — Progress Notes (Signed)
[facility] Progress Note  Patient: Nicole Clayton [id number]   PCP: Ezequiel Kayser, MD DOB: 1928/05/11   DOA: 01/23/2019   DOS: 01/26/2019   Date of Service: the patient was seen and examined on 01/26/2019  Chief Complaint  Patient presents with  . Flank Pain     Brief hospital course: Pt. with PMH of ovarian fallopian tube cancer, mitral valve prolapse, GERD, DVT and PE on Xarelto but currently on no anticoagulation; presented with complain of flank pain, was found to have hydronephrosis with stone.  On 01/25/2019 patient developed volume overload and was given IV Lasix. Later in the afternoon on 01/25/2019 patient was confused.  Currently further plan is monitor improvement with reintroduction of gabapentin.  Subjective: More awake but remains tired and fatigued and lethargic.  No nausea or vomiting.  Assessment and Plan: Scheduled Meds: . Chlorhexidine Gluconate Cloth  6 each Topical Q0600  . enoxaparin (LOVENOX) injection  30 mg Subcutaneous Q24H  . fluticasone  2 spray Each Nare Daily  . gabapentin  200 mg Oral BID  . lactulose  20 g Oral Daily  . mupirocin ointment  1 application Nasal BID  . pantoprazole  40 mg Oral Daily   Continuous Infusions: PRN Meds: acetaminophen **OR** acetaminophen, albuterol, morphine injection, ondansetron **OR** ondansetron (ZOFRAN) IV, zolpidem  1.  Obstructive uropathy with left hydroureteronephrosis likely secondary to distal left ureteral mass.   Urology was consulted. Since the patient did not have any ongoing pain they recommended conservative measures without any intervention. In and out catheterization was performed with significant retention. Urinalysis was negative for any acute infection. We will continue to monitor. Bladder scan every 8 hours. In and out catheterization as needed. May require Foley catheter.  2.  Acute kidney injury secondary to above.   Patient was given IV hydration. Currently renal function is about  the same but the patient appears to be significantly volume overloaded. Patient was given IV Lasix with improvement in renal function. Monitor.  3.  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   Patient is on hospice since January 2020. Continue current medication.  4. Type 2 diabetes mellitus.   Does not appear to be on any medication outpatient. Hemoglobin A1c 6.5 thus meeting the criteria. Patient was started on sliding scale insulin. Currently given the patient's status on hospice will discontinue surgical insulin.  5.  Acute on chronic diastolic CHF. We will provide IV Lasix x1 and monitor renal function. Discontinue IV fluids.  6.  Neuropathy. Patient is on gabapentin for many years. Asterixis resolved. Reintroduce gabapentin at a lower dose.  7.  Acute metabolic encephalopathy. Likely from polypharmacy. Patient is on gabapentin suspect that this is contributing to her encephalopathy with asterixis in the setting of acute kidney injury. We will hold this medication and monitor. Continue lactulose. Will reduce gabapentin at a lower dose than her baseline.  Monitor.  8.  Goals of care discussion. Patient is currently on hospice since January 2020 for her metastatic ovarian cancer. Discussed with daughter on a daily basis. 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 Urology thinks that that is a possible distal ureteral mass which led to left hydronephrosis and AKI. Daughter was informed that we might be seeing frequent UTIs as well as recurrent acute kidney injury from hydronephrosis and we may have frequent hospitalization if the goal is changed from hospice to  treat what is treatable. Daughter also question about physical therapy as well as skilled nursing facility as the patient is significantly weak than her baseline. I will reach out to case manager to  reach out to [facility] hospice for input.  Diet: Cardiac diet  DVT Prophylaxis: Subcutaneous Lovenox   Advance goals of care discussion: DNR  Family Communication: no family was present at bedside, at the time of interview. The pt provided permission to discuss medical plan with the family. Opportunity was given to ask question and all questions were answered satisfactorily.   Disposition:  Discharge depending on goals of care and PT eval.  Likely tomorrow  Consultants: Urology Procedures: none  Antibiotics: Anti-infectives (From admission, onward)   None       Objective: Physical Exam: Vitals:   01/25/19 2116 01/26/19 0447 01/26/19 1217 01/26/19 1647  BP: 120/73 115/68 125/63 110/60  Pulse: 90 (!) 118 85 (!) 101  Resp: 20 20 16    Temp: 97.6 F (36.4 C) 97.7 F (36.5 C) (!) 97.3 F (36.3 C) 98.2 F (36.8 C)  TempSrc: Oral Oral  Oral  SpO2: 97% 96% 91% 93%  Weight:      Height:        Intake/Output Summary (Last 24 hours) at 01/26/2019 1939 Last data filed at 01/26/2019 1400 Gross per 24 hour  Intake 180 ml  Output 1700 ml  Net -1520 ml   Filed Weights   01/23/19 2017  Weight: 62.6 kg   General: alert and not oriented to time, place, and person. Appear in moderate distress, affect anxious Eyes: PERRL, Conjunctiva normal ENT: Oral Mucosa Clear, moist  Neck: difficult to assess  JVD, no Abnormal Mass Or lumps Cardiovascular: S1 and S2 Present, no Murmur,  Respiratory: increased respiratory effort, Bilateral Air entry equal and Decreased, no signs of accessory muscle use, bilateral  Crackles, no wheezes Abdomen: Bowel Sound present, Soft and no tenderness, no hernia Skin: no rashes  Extremities: bilateral  Pedal edema, no calf tenderness Neurologic: without any new focal findings Gait not checked due to patient safety concerns  Data Reviewed: I have personally reviewed and interpreted daily labs, tele strips, imagings as discussed above. I reviewed all  nursing notes, pharmacy notes, vitals, pertinent old records I have discussed plan of care as described above with RN and patient/family.  CBC: Recent Labs  Lab 01/23/19 2024 01/24/19 0557 01/26/19 0609  WBC 8.7 7.4 10.0  NEUTROABS 6.7  --   --   HGB 12.3 11.7* 12.1  HCT 38.1 36.4 37.4  MCV 96.2 96.6 95.2  PLT 380 340 [id number]   Basic Metabolic Panel: Recent Labs  Lab 01/23/19 2024 01/24/19 0557 01/25/19 1104 01/26/19 0609  NA 136 138 136 136  K 4.3 4.1 4.0 3.5  CL 101 104 103 98  CO2 26 26 25 27   GLUCOSE 142* 113* 143* 126*  BUN 22 21 23 20   CREATININE 1.52* 1.49* 1.43* 1.25*  CALCIUM 8.7* 8.5* 8.2* 8.6*  MG  --   --  1.8  --     Liver Function Tests: Recent Labs  Lab 01/23/19 2024  AST 32  ALT 12  ALKPHOS 70  BILITOT 0.4  PROT 7.3  ALBUMIN 3.6   No results for input(s): LIPASE, AMYLASE in the last 168 hours. No results for input(s): AMMONIA in the last 168 hours. Coagulation Profile: No results for input(s): INR, PROTIME in the last 168 hours. Cardiac Enzymes: No results for input(s): CKTOTAL, CKMB, CKMBINDEX, TROPONINI in the  last 168 hours. BNP (last 3 results) No results for input(s): PROBNP in the last 8760 hours. CBG: Recent Labs  Lab 01/24/19 0835 01/24/19 1130 01/24/19 1706 01/24/19 2130 01/25/19 0751  GLUCAP 97 111* 85 122* 86   Studies: No results found.   Time spent: 35 minutes  Author: Berle Mull, MD [facility] 01/26/2019 7:39 PM  To reach On-call, see care teams to locate the attending and reach out to them via [email or web address]. If 7PM-7AM, please contact night-coverage If you still have difficulty reaching the attending provider, please page the [facility]: Lambert Rhonda W (Director on Call) for [facility] on amion for assistance.

## 2019-01-27 ENCOUNTER — Other Ambulatory Visit: Payer: Self-pay

## 2019-01-27 DIAGNOSIS — I471 Supraventricular tachycardia: Secondary | ICD-10-CM

## 2019-01-27 LAB — GLUCOSE, CAPILLARY
Glucose-Capillary: 180 mg/dL — ABNORMAL HIGH (ref 70–99)
Glucose-Capillary: 197 mg/dL — ABNORMAL HIGH (ref 70–99)

## 2019-01-27 LAB — CBC
HCT: 33.2 % — ABNORMAL LOW (ref 36.0–46.0)
Hemoglobin: 11.5 g/dL — ABNORMAL LOW (ref 12.0–15.0)
MCH: 30.7 pg (ref 26.0–34.0)
MCHC: 34.6 g/dL (ref 30.0–36.0)
MCV: 88.8 fL (ref 80.0–100.0)
Platelets: 366 10*3/uL (ref 150–400)
RBC: 3.74 MIL/uL — ABNORMAL LOW (ref 3.87–5.11)
RDW: 13.4 % (ref 11.5–15.5)
WBC: 9.2 10*3/uL (ref 4.0–10.5)
nRBC: 0 % (ref 0.0–0.2)

## 2019-01-27 LAB — CULTURE, BLOOD (ROUTINE X 2): Special Requests: ADEQUATE

## 2019-01-27 MED ORDER — HALOPERIDOL LACTATE 5 MG/ML IJ SOLN: 2.0000 mg | INTRAMUSCULAR | Status: DC | PRN

## 2019-01-27 MED ORDER — ACETAMINOPHEN 650 MG RE SUPP: 650.0000 mg | Freq: Four times a day (QID) | RECTAL | Status: DC | PRN

## 2019-01-27 MED ORDER — METOPROLOL TARTRATE 5 MG/5ML IV SOLN
5.0000 mg | Freq: Once | INTRAVENOUS | Status: AC
  Administered 2019-01-27: 18:00:00 5 mg via INTRAVENOUS

## 2019-01-27 MED ORDER — DIPHENHYDRAMINE HCL 50 MG/ML IJ SOLN: 12.5000 mg | INTRAMUSCULAR | Status: DC | PRN

## 2019-01-27 MED ORDER — METOPROLOL TARTRATE 5 MG/5ML IV SOLN
INTRAVENOUS | Status: AC
  Filled 2019-01-27: qty 5

## 2019-01-27 MED ORDER — KETOROLAC TROMETHAMINE 30 MG/ML IJ SOLN
30.0000 mg | Freq: Once | INTRAMUSCULAR | Status: DC
  Filled 2019-01-27: qty 1

## 2019-01-27 MED ORDER — HALOPERIDOL 2 MG PO TABS
2.0000 mg | ORAL_TABLET | ORAL | Status: DC | PRN
  Filled 2019-01-27: qty 1

## 2019-01-27 MED ORDER — ONDANSETRON 4 MG PO TBDP: 4.0000 mg | ORAL_TABLET | Freq: Four times a day (QID) | ORAL | Status: DC | PRN

## 2019-01-27 MED ORDER — ALBUTEROL SULFATE (2.5 MG/3ML) 0.083% IN NEBU: 2.5000 mg | INHALATION_SOLUTION | RESPIRATORY_TRACT | Status: DC | PRN

## 2019-01-27 MED ORDER — BIOTENE DRY MOUTH MT LIQD: 15.0000 mL | OROMUCOSAL | Status: DC | PRN

## 2019-01-27 MED ORDER — SODIUM CHLORIDE 0.9 % IV SOLN
12.5000 mg | Freq: Four times a day (QID) | INTRAVENOUS | Status: DC | PRN
  Filled 2019-01-27: qty 0.5

## 2019-01-27 MED ORDER — GABAPENTIN 100 MG PO CAPS: 100.0000 mg | ORAL_CAPSULE | Freq: Every day | ORAL | Status: DC

## 2019-01-27 MED ORDER — OXYCODONE HCL 20 MG/ML PO CONC: 5.0000 mg | ORAL | Status: DC | PRN

## 2019-01-27 MED ORDER — ACETAMINOPHEN 325 MG PO TABS: 650.0000 mg | ORAL_TABLET | Freq: Four times a day (QID) | ORAL | Status: DC | PRN

## 2019-01-27 MED ORDER — HALOPERIDOL LACTATE 2 MG/ML PO CONC
2.0000 mg | ORAL | Status: DC | PRN
  Filled 2019-01-27: qty 1

## 2019-01-27 MED ORDER — LORAZEPAM 2 MG/ML IJ SOLN: 1.0000 mg | INTRAMUSCULAR | Status: DC | PRN

## 2019-01-27 MED ORDER — ONDANSETRON HCL 4 MG/2ML IJ SOLN: 4.0000 mg | Freq: Four times a day (QID) | INTRAMUSCULAR | Status: DC | PRN

## 2019-01-27 MED ORDER — LORAZEPAM 2 MG/ML PO CONC: 1.0000 mg | ORAL | Status: DC | PRN

## 2019-01-27 MED ORDER — HYDROMORPHONE HCL 1 MG/ML IJ SOLN
0.5000 mg | INTRAMUSCULAR | Status: DC | PRN
  Administered 2019-01-27: 0.5 mg via INTRAVENOUS
  Filled 2019-01-27: qty 0.5

## 2019-01-27 MED ORDER — ATROPINE SULFATE 1 % OP SOLN
4.0000 [drp] | OPHTHALMIC | Status: DC | PRN
  Filled 2019-01-27: qty 2

## 2019-01-27 MED ORDER — MAGIC MOUTHWASH
15.0000 mL | Freq: Four times a day (QID) | ORAL | Status: DC | PRN
  Filled 2019-01-27: qty 20

## 2019-01-27 MED ORDER — GLYCOPYRROLATE 0.2 MG/ML IJ SOLN
0.1000 mg | INTRAMUSCULAR | Status: DC | PRN
  Filled 2019-01-27: qty 0.5

## 2019-01-27 MED ORDER — LORAZEPAM 1 MG PO TABS: 1.0000 mg | ORAL_TABLET | ORAL | Status: DC | PRN

## 2019-01-27 MED ORDER — POLYVINYL ALCOHOL 1.4 % OP SOLN
1.0000 [drp] | Freq: Four times a day (QID) | OPHTHALMIC | Status: DC | PRN
  Filled 2019-01-27: qty 15

## 2019-01-27 NOTE — Evaluation (Signed)
Physical Therapy Evaluation Patient Details Name: Nicole Clayton MRN: [id number] DOB: 04-Jul-1928 Today's Date: 01/27/2019   History of Present Illness  Patient is an 85 year old female admitted with flank pain. Found to have obstructive uropathy. PMH includes: Gerd, DVT, PE, Ovarian cancer with lymphedema of LE, fallopian tube Ca, liver and lung mets, MI  Clinical Impression  Patient received in bed finishing breakfast. Agrees to PT assessment. Reports she is weak and unsure if she can get up to chair. Required min assist with bed mobility to get seated at edge of bed with feet on floor. Required mod assist with sit to stand, and was unable to get fully balanced when standing with rolling walker. Returned to sitting. Patient then assisted to recliner via max assist stand pivot transfer. She was able to take a couple of steps during transfer. She will continue to benefit from skilled PT while here to improve strength, balance, and functional independence with mobility.      Follow Up Recommendations SNF;Supervision for mobility/OOB    Equipment Recommendations  None recommended by PT    Recommendations for Other Services       Precautions / Restrictions Precautions Precautions: Fall Restrictions Weight Bearing Restrictions: No      Mobility  Bed Mobility Overal bed mobility: Needs Assistance Bed Mobility: Supine to Sit     Supine to sit: Min assist     General bed mobility comments: min assist to bring LEs off edge of bed and to scoot to edge of bed.  Transfers Overall transfer level: Needs assistance Equipment used: Rolling walker (2 wheeled) Transfers: Sit to/from [facility] Sit to Stand: From elevated surface;Mod assist Stand pivot transfers: Max assist       General transfer comment: patient unable to get standing balance, leaning backs of legs on bed. Unable to take a step to help with balance.  Ambulation/Gait Ambulation/Gait assistance: Max  assist Gait Distance (Feet): 2 Feet Assistive device: None Gait Pattern/deviations: Shuffle Gait velocity: decreased   General Gait Details: patient able to take a couple of pivoting steps from bed to recliner with max assist.  Stairs            Wheelchair Mobility    Modified Rankin (Stroke Patients Only)       Balance Overall balance assessment: Needs assistance Sitting-balance support: Feet supported Sitting balance-Leahy Scale: Fair     Standing balance support: Bilateral upper extremity supported;During functional activity Standing balance-Leahy Scale: Zero Standing balance comment: unable to gain balance in standing with mod assist                             Pertinent Vitals/Pain Pain Assessment: No/denies pain    Home Living Family/patient expects to be discharged to:: Assisted living               Home Equipment: Walker - 2 wheels      Prior Function Level of Independence: Needs assistance   Gait / Transfers Assistance Needed: ambulates with rolling walker  ADL's / Homemaking Assistance Needed: ALF manages meals, meds, cleaning        Hand Dominance   Dominant Hand: Right    Extremity/Trunk Assessment   Upper Extremity Assessment Upper Extremity Assessment: Generalized weakness    Lower Extremity Assessment Lower Extremity Assessment: Generalized weakness    Cervical / Trunk Assessment Cervical / Trunk Assessment: Kyphotic  Communication   Communication: No difficulties  Cognition Arousal/Alertness:  Awake/alert Behavior During Therapy: WFL for tasks assessed/performed Overall Cognitive Status: Within Functional Limits for tasks assessed                                        General Comments      Exercises Other Exercises Other Exercises: LAQ x 10 reps B   Assessment/Plan    PT Assessment Patient needs continued PT services  PT Problem List Decreased strength;Decreased mobility;Decreased  activity tolerance;Decreased balance       PT Treatment Interventions Therapeutic activities;Gait training;Therapeutic exercise;Functional mobility training;Balance training;Neuromuscular re-education;Patient/family education    PT Goals (Current goals can be found in the Care Plan section)  Acute Rehab PT Goals Patient Stated Goal: to improve strength PT Goal Formulation: With patient Time For Goal Achievement: 02/03/19 Potential to Achieve Goals: Fair    Frequency Min 2X/week   Barriers to discharge Decreased caregiver support      Co-evaluation               AM-PAC PT "6 Clicks" Mobility  Outcome Measure Help needed turning from your back to your side while in a flat bed without using bedrails?: A Little Help needed moving from lying on your back to sitting on the side of a flat bed without using bedrails?: A Little Help needed moving to and from a bed to a chair (including a wheelchair)?: A Lot Help needed standing up from a chair using your arms (e.g., wheelchair or bedside chair)?: A Lot Help needed to walk in hospital room?: Total Help needed climbing 3-5 steps with a railing? : Total 6 Click Score: 12    End of Session Equipment Utilized During Treatment: Gait belt Activity Tolerance: Patient tolerated treatment well;Patient limited by fatigue Patient left: in chair;with call bell/phone within reach;with chair alarm set;with nursing/sitter in room Nurse Communication: Mobility status PT Visit Diagnosis: Unsteadiness on feet (R26.81);Muscle weakness (generalized) (M62.81);Difficulty in walking, not elsewhere classified (R26.2);Other abnormalities of gait and mobility (R26.89)    Time: 0940-1005 PT Time Calculation (min) (ACUTE ONLY): 25 min   Charges:   PT Evaluation $PT Eval Moderate Complexity: 1 Mod PT Treatments $Therapeutic Activity: 8-22 mins        Tishawna Larouche, PT, GCS 01/27/19,11:06 AM

## 2019-01-27 NOTE — Progress Notes (Signed)
   01/27/19 1700  Clinical Encounter Type  Visited With Family;Patient not available  Visit Type Initial;Code  Referral From Nurse  Spiritual Encounters  Spiritual Needs Emotional  Stress Factors  Family Stress Factors Health changes;Lack of knowledge   Chaplain received a RR page for the patient. Upon arrival, the patient's daughter was standing outside of the room as the medical team assessed the patient's status and administered care. This chaplain offered silent prayer and talked with the patient's daughter who shared some of the patient's relevant medical history. The patient's daughter lamented the inability of her brother and sister to be present with her mother at this difficult time. She also reported that her mother is "very much ready to go", which she feels the situation is headed towards. The patient's daughter exhibits spiritual strengths such as prayer, faith, and hope as she also grapples with the reality of her mother's declining health. Chaplain provided support in the form of active and reflective listening, compassionate ministerial presence, comfort, non-anxious presence, and prayer.

## 2019-01-27 NOTE — Progress Notes (Signed)
[facility] Progress Note  Patient: Nicole Clayton [id number]   PCP: Ezequiel Kayser, MD DOB: [birth year]   DOA: 01/23/2019   DOS: 01/27/2019   Date of Service: the patient was seen and examined on 01/27/2019  Chief Complaint  Patient presents with  . Flank Pain   Brief hospital course: Pt. with PMH of metastatic ovarian fallopian tube cancer, mitral valve prolapse, GERD, DVT and PE on Xarelto but currently on no anticoagulation, on hospice; presented with complain of flank pain, was found to have hydronephrosis with stone.  On 01/25/2019 patient developed volume overload and was given IV Lasix. Later in the afternoon on 01/25/2019 patient was confused.  Currently further plan is comfort care  Subjective: Patient was more awake in the morning but per daughter was sleepy. Patient actually had reported no acute complaint in the morning but will fall asleep mid conversation. Later in the afternoon patient become febrile had an episode of SVT and was minimally responsive.  Was able to follow commands but significantly weak.  Assessment and Plan: Scheduled Meds: . fluticasone  2 spray Each Nare Daily  . ketorolac  30 mg Intravenous Once  . metoprolol tartrate       Continuous Infusions: . chlorproMAZINE (THORAZINE) IV     PRN Meds: acetaminophen **OR** acetaminophen, albuterol, antiseptic oral rinse, atropine, chlorproMAZINE (THORAZINE) IV, glycopyrrolate, haloperidol **OR** haloperidol **OR** haloperidol lactate, HYDROmorphone (DILAUDID) injection, LORazepam **OR** LORazepam **OR** LORazepam, LORazepam, ondansetron **OR** ondansetron (ZOFRAN) IV, oxyCODONE **OR** oxyCODONE, polyvinyl alcohol, zolpidem  1.  Obstructive uropathy with left hydroureteronephrosis likely secondary to distal left ureteral mass.   Urology was consulted. Since the patient did not have any ongoing pain they recommended conservative measures without any intervention. In and out catheterization was performed with  significant retention. Urinalysis was negative for any acute infection. We will continue to monitor. Bladder scan every 8 hours. In and out catheterization as needed. May require Foley catheter.  2.  Acute kidney injury secondary to above.   Patient was given IV hydration. Currently renal function is about the same but the patient appears to be significantly volume overloaded. Patient was given IV Lasix with improvement in renal function. Monitor.  3.  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   Patient is on hospice since January 2020. Continue current medication.  4. Type 2 diabetes mellitus.   Does not appear to be on any medication outpatient. Hemoglobin A1c 6.5 thus meeting the criteria. Patient was started on sliding scale insulin. Currently given the patient's status on hospice will discontinue insulin.  5.  Acute on chronic diastolic CHF. Patient was given IV Lasix x1 and monitor renal function. Discontinue IV fluids.  6.  Neuropathy. Patient is on gabapentin for many years. Asterixis resolved.  7.  Acute metabolic encephalopathy. Likely from polypharmacy. Patient is on gabapentin suspect that this is contributing to her encephalopathy with asterixis in the setting of acute kidney injury. We will hold this medication and monitor.  8.  SVT Patient underwent SVT on 01/27/2019 afternoon. Patient was given Lopressor with improvement. Suspect SVT secondary to fever. Patient may have become septic secondary to obstruction uropathy and may have UTI. Family has decided to transition patient to comfort care.   9.  Goals of care discussion. Advance care planning. Patient is DNR and is currently on hospice. Family's initial goal was to treat what is treatable with the limitation of the DNR. Patient had multiple comorbidities during this  hospitalization including acute on chronic CHF requiring IV Lasix,  acute metabolic encephalopathy from medication induced injury as well as AKI as well as UTI. Patient become febrile had SVT on 01/27/2019 with minimal responsiveness. Possibility of a stroke also cannot be ruled out. With this discussed with patient's daughter who was at bedside regarding goals of care. Patient was transitioned to comfort care. Palliative care order sets were ordered. Anticipating actually in hospital death for the patient.  Diet: NPO diet  DVT Prophylaxis: Comfort care  Advance goals of care discussion: DNR  Family Communication: Daughter at bedside  Disposition:  Discharge to be determined   Consultants: Urology Procedures: none  Antibiotics: Anti-infectives (From admission, onward)   Start     Dose/Rate Route Frequency Ordered Stop   01/27/19 2100  fluconazole (DIFLUCAN) IVPB 100 mg  Status:  Discontinued     100 mg 50 mL/hr over 60 Minutes Intravenous Every 24 hours 01/26/19 2009 01/26/19 2019   01/27/19 2000  fluconazole (DIFLUCAN) tablet 50 mg  Status:  Discontinued     50 mg Oral Daily 01/26/19 2019 01/27/19 1751   01/26/19 2100  fluconazole (DIFLUCAN) IVPB 200 mg  Status:  Discontinued     200 mg 100 mL/hr over 60 Minutes Intravenous  Once 01/26/19 2009 01/26/19 2019   01/26/19 2030  fluconazole (DIFLUCAN) tablet 100 mg     100 mg Oral  Once 01/26/19 2019 01/26/19 2237       Objective: Physical Exam: Vitals:   01/27/19 0504 01/27/19 1329 01/27/19 1720 01/27/19 1833  BP: 116/61 115/79 (!) 165/95 (!) 113/94  Pulse: (!) 112 (!) 123 (!) 177 (!) 106  Resp: 18 18  (!) 24  Temp: 98.4 F (36.9 C) (!) 100.7 F (38.2 C) (!) 102.9 F (39.4 C) (!) 102.9 F (39.4 C)  TempSrc: Oral Oral Oral Axillary  SpO2: 92% 98% 90% 97%  Weight:      Height:        Intake/Output Summary (Last 24 hours) at 01/27/2019 1936 Last data filed at 01/27/2019 1900 Gross per 24 hour  Intake 480 ml  Output 270 ml  Net 210 ml   Filed Weights   01/23/19 2017  Weight:  62.6 kg   General: lethargic and not oriented to time, place, and person. Appear in severe distress, affect flat in affect, right-sided gaze preference, not following commands consistently Eyes: PERRL, Conjunctiva normal ENT: Oral Mucosa Clear, dry  Neck: difficult to assess  JVD, no Abnormal Mass Or lumps Cardiovascular: S1 and S2 Present, no Murmur, peripheral pulses symmetrical Respiratory: increased respiratory effort, Bilateral Air entry equal and Decreased, no signs of accessory muscle use, bilateral  Crackles, no wheezes Abdomen: Bowel Sound present, Soft and mild tenderness, no hernia Skin: no rashes  Extremities: Right leg lymphedema, trace left pedal edema, no calf tenderness Neurologic: Right gaze preference, unable to follow commands consistently weak cough, not moving upper extremities at all bilaterally. Gait not checked due to patient safety concerns   Data Reviewed: I have personally reviewed and interpreted daily labs, tele strips, imagings as discussed above. I reviewed all nursing notes, pharmacy notes, vitals, pertinent old records I have discussed plan of care as described above with RN and patient/family.  CBC: Recent Labs  Lab 01/23/19 2024 01/24/19 0557 01/26/19 0609 01/27/19 0531  WBC 8.7 7.4 10.0 9.2  NEUTROABS 6.7  --   --   --   HGB 12.3 11.7* 12.1 11.5*  HCT 38.1 36.4 37.4 33.2*  MCV 96.2  96.6 95.2 88.8  PLT 380 340 374 [id number]   Basic Metabolic Panel: Recent Labs  Lab 01/23/19 2024 01/24/19 0557 01/25/19 1104 01/26/19 0609  NA 136 138 136 136  K 4.3 4.1 4.0 3.5  CL 101 104 103 98  CO2 26 26 25 27   GLUCOSE 142* 113* 143* 126*  BUN 22 21 23 20   CREATININE 1.52* 1.49* 1.43* 1.25*  CALCIUM 8.7* 8.5* 8.2* 8.6*  MG  --   --  1.8  --     Liver Function Tests: Recent Labs  Lab 01/23/19 2024  AST 32  ALT 12  ALKPHOS 70  BILITOT 0.4  PROT 7.3  ALBUMIN 3.6   No results for input(s): LIPASE, AMYLASE in the last 168 hours. No results for  input(s): AMMONIA in the last 168 hours. Coagulation Profile: No results for input(s): INR, PROTIME in the last 168 hours. Cardiac Enzymes: No results for input(s): CKTOTAL, CKMB, CKMBINDEX, TROPONINI in the last 168 hours. BNP (last 3 results) No results for input(s): PROBNP in the last 8760 hours. CBG: Recent Labs  Lab 01/24/19 1706 01/24/19 2130 01/25/19 0751 01/27/19 1726 01/27/19 1903  GLUCAP 85 122* 86 180* 197*   Studies: No results found.   Time spent: 35 minutes  Author: Berle Mull, MD [facility] 01/27/2019 7:36 PM  To reach On-call, see care teams to locate the attending and reach out to them via [email or web address]. If 7PM-7AM, please contact night-coverage If you still have difficulty reaching the attending provider, please page the [facility] (Director on Call) for [facility] on amion for assistance.

## 2019-01-27 NOTE — TOC Initial Note (Addendum)
Transition of Care [facility]) - Initial/Assessment Note    Patient Details  Name: Nicole Clayton MRN: [id number] Date of Birth: [birth year]  Transition of Care [facility]) CM/SW Contact:    Beverly Sessions, RN Phone Number: 01/27/2019, 3:21 PM  Clinical Narrative:                 Patient from [facility] ALF with [facility]  Voicemail left for daughter to confirm that she wishes for patient to return with hospice services.  Awaiting return call  RNCM confirmed with [facility] at [facility] that patient can return tomorrow  RNCM reached out to [employer] with [employer]. Their staff will follow up with family on goals of care   MD has ordered repeat covid test.  Bedside RN notified that test needs to be sent today, in order for patient to discharge     Expected Discharge Plan: Assisted Living Barriers to Discharge: Continued Medical Work up   Patient Goals and CMS Choice        Expected Discharge Plan and Services Expected Discharge Plan: Assisted Living                                              Prior Living Arrangements/Services     Patient language and need for interpreter reviewed:: Yes                 Activities of Daily Living Home Assistive Devices/Equipment: Environmental consultant (specify type) ADL Screening (condition at time of admission) Patient's cognitive ability adequate to safely complete daily activities?: Yes Is the patient deaf or have difficulty hearing?: No Does the patient have difficulty seeing, even when wearing glasses/contacts?: No Does the patient have difficulty concentrating, remembering, or making decisions?: No Patient able to express need for assistance with ADLs?: Yes Does the patient have difficulty dressing or bathing?: No Independently performs ADLs?: Yes (appropriate for developmental age) Does the patient have difficulty walking or climbing stairs?: No Weakness of Legs: None Weakness of Arms/Hands:  None  Permission Sought/Granted                  Emotional Assessment           Psych Involvement: No (comment)  Admission diagnosis:  Dehydration [E86.0] Hydroureteronephrosis [N13.30] Obstructive uropathy [N13.9] DNR (do not resuscitate) [Z66] Acute kidney injury ([place]) [N17.9] Malignant neoplasm of fallopian tube, unspecified laterality ([place]) [C57.00] Acute respiratory failure with hypoxemia ([place]) [J96.01] Suspected COVID-19 virus infection [Z20.828] Patient Active Problem List   Diagnosis Date Noted  . Hospice care patient 01/25/2019  . Obstructive uropathy 01/24/2019  . DNR no code (do not resuscitate) 01/24/2019  . Candidiasis of skin 10/22/2018  . Intertriginous dermatitis associated with moisture 10/22/2018  . Weakness 10/21/2017  . Pain and swelling of right lower leg 10/11/2016  . Anemia 09/11/2015  . UTI (urinary tract infection) due to Enterococcus 07/19/2015  . 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 Neuropathy 02/10/2015  . Midline cystocele 12/15/2014  . Vaginal atrophy 12/15/2014  . Status post hysterectomy with oophorectomy 12/15/2014  . Celiac disease 11/07/2014  . MI (mitral incompetence) 10/27/2014  . Infection of urinary tract 08/12/2014  . CD (celiac disease) 07/12/2014  . Mixed hyperlipidemia 04/12/2014  . Paroxysmal supraventricular tachycardia ([place]) 04/12/2014  . Thrombocythemia ([place]) 09/26/2013  . Vitamin D deficiency 09/26/2013  . Frequent UTI 09/26/2013  .  Chest pain 07/28/2013  . Edema 07/06/2013  . Other symptoms involving urinary system 07/07/2012  . Female genuine stress incontinence 02/10/2012  . Bladder infection, chronic 10/19/2011  . Urge incontinence 10/19/2011  . 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

## 2019-01-27 NOTE — Care Management Important Message (Signed)
Important Message  Patient Details  Name: Nicole Clayton MRN: [id number] Date of Birth: [birth year]   Medicare Important Message Given:  Yes     Dannette Barbara 01/27/2019, 11:08 AM

## 2019-01-27 NOTE — Significant Event (Signed)
Rapid Response Event Note  Overview: Time Called: 1726 Arrival Time: 1727 Event Type: Cardiac  Initial Focused Assessment: Arrived in room and patient was in chair. Patient's nurses slid her from chair back to bed. Rapid response called due to elevated heart rate (190s on dinamap, patient not on cardiac monitoring) and elevated temperature. Vitals signs once back in bed at 17:33 were BP 115/81, MAP 90, temperature 102.9 F, oxygen saturations were 94% on 4 L nasal cannula, HR 197 SVT on montior. Patient's mental status waxed and waned during assessment. Alert but only follow some commands not others and only answered some questions. Dr. Posey Pronto arrived shortly after rapid response RN. Patient unable to follow commands enough for a deep cough or to bear down to attempt to lower heart rate.  Interventions: 12 lead EKG showed heart rate in 190s and SVT. Dr. Posey Pronto ordered a bolus and since patient was not fully responding clarified plan of care with daughter. Daughter requested patient transition to comfort care so MD ordered 5 mg of metoprolol IV, a 250 mL IV bolus, and then patient would transition to comfort care. Patient initial IV infiltrated so Beth RN placed new IV for medication administration. After metoprolol administration, HR 105 afib vs sinus arrhthymia.  Plan of Care (if not transferred): Patient to transition to comfort care.   Event Summary: Name of Physician Notified: Dr. Posey Pronto at 1725    at    Outcome: Stayed in room and stabalized, Code status clarified  Event End Time: [place], [place]

## 2019-01-27 NOTE — NC FL2 (Signed)
[place] LEVEL OF CARE SCREENING TOOL     IDENTIFICATION  Patient Name: Nicole Clayton Birthdate: August 31, 1928 Sex: female Admission Date (Current Location): 01/23/2019  [facility] and [place] Number:  Engineering geologist and Address:         Provider Number: [id number]  Attending Physician Name and Address:  Lavina Hamman, MD  Relative Name and Phone Number:       Current Level of Care: Hospital Recommended Level of Care: [place] Prior Approval Number:    Date Approved/Denied:   PASRR Number:    Discharge Plan: Other (Comment)(ALF)    Current Diagnoses: Patient Active Problem List   Diagnosis Date Noted  . Hospice care patient 01/25/2019  . Obstructive uropathy 01/24/2019  . DNR no code (do not resuscitate) 01/24/2019  . Candidiasis of skin 10/22/2018  . Intertriginous dermatitis associated with moisture 10/22/2018  . Weakness 10/21/2017  . Pain and swelling of right lower leg 10/11/2016  . Anemia 09/11/2015  . UTI (urinary tract infection) due to Enterococcus 07/19/2015  . 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 Neuropathy 02/10/2015  . Midline cystocele 12/15/2014  . Vaginal atrophy 12/15/2014  . Status post hysterectomy with oophorectomy 12/15/2014  . Celiac disease 11/07/2014  . MI (mitral incompetence) 10/27/2014  . Infection of urinary tract 08/12/2014  . CD (celiac disease) 07/12/2014  . Mixed hyperlipidemia 04/12/2014  . Paroxysmal supraventricular tachycardia ([place]) 04/12/2014  . Thrombocythemia ([place]) 09/26/2013  . Vitamin D deficiency 09/26/2013  . Frequent UTI 09/26/2013  . Chest pain 07/28/2013  . Edema 07/06/2013  . Other symptoms involving urinary system 07/07/2012  . Female genuine stress incontinence 02/10/2012  . Bladder infection, chronic 10/19/2011  . Urge incontinence 10/19/2011  . Malignant neoplasm of right fallopian tube ([place]) 10/05/2009    Orientation RESPIRATION BLADDER Height & Weight     Self, Place  Normal Continent Weight: 62.6 kg Height:  5' (152.4 cm)  BEHAVIORAL SYMPTOMS/MOOD NEUROLOGICAL BOWEL NUTRITION STATUS      Continent Diet(2 gram sodium diet)  AMBULATORY STATUS COMMUNICATION OF NEEDS Skin   Extensive Assist Verbally Normal                       Personal Care Assistance Level of Assistance              Functional Limitations Info             SPECIAL CARE FACTORS FREQUENCY                       Contractures Contractures Info: Not present    Additional Factors Info  Code Status, Allergies Code Status Info: DNR Allergies Info: Benadryl, Tamsulosin, Alendronate Sodium, Duloxetine, Duloxetine Hcl, Risedronate Sodium, Lovastatin, Sulfa Antibiotics           Current Medications (01/27/2019):  This is the current hospital active medication list Current Facility-Administered Medications  Medication Dose Route Frequency Provider Last Rate Last Admin  . acetaminophen (TYLENOL) tablet 650 mg  650 mg Oral Q6H PRN Mansy, Jan A, MD   650 mg at 01/26/19 1237   Or  . acetaminophen (TYLENOL) suppository 650 mg  650 mg Rectal Q6H PRN Mansy, Jan A, MD      . albuterol (PROVENTIL) (2.5 MG/3ML) 0.083% nebulizer solution 2.5 mg  2.5 mg Inhalation Q6H PRN Mansy, Jan A, MD      . Chlorhexidine Gluconate Cloth 2 % PADS 6 each  6 each Topical [phone]  Lavina Hamman, MD   6 each at 01/27/19 0411  . enoxaparin (LOVENOX) injection 30 mg  30 mg Subcutaneous Q24H Mansy, Jan A, MD   30 mg at 01/27/19 0558  . fluconazole (DIFLUCAN) tablet 50 mg  50 mg Oral Daily Rito Ehrlich A, RPH      . fluticasone (FLONASE) 50 MCG/ACT nasal spray 2 spray  2 spray Each Nare Daily Mansy, Jan A, MD   2 spray at 01/27/19 1001  . [START ON 01/28/2019] gabapentin (NEURONTIN) capsule 100 mg  100 mg Oral QHS Lavina Hamman, MD      . lactulose (CHRONULAC) 10 GM/15ML solution 20 g  20 g Oral Daily Lavina Hamman, MD   20 g at 01/27/19 1000  . morphine 2 MG/ML injection 2 mg  2 mg Intravenous  Q4H PRN Mansy, Jan A, MD      . mupirocin ointment (BACTROBAN) 2 % 1 application  1 application Nasal BID Lavina Hamman, MD   1 application at [id number]  . ondansetron (ZOFRAN) tablet 4 mg  4 mg Oral Q6H PRN Mansy, Jan A, MD       Or  . ondansetron [facility]) injection 4 mg  4 mg Intravenous Q6H PRN Mansy, Jan A, MD      . pantoprazole (PROTONIX) EC tablet 40 mg  40 mg Oral Daily Mansy, Jan A, MD   40 mg at 01/27/19 1001  . zolpidem (AMBIEN) tablet 5 mg  5 mg Oral QHS PRN Lavina Hamman, MD       Facility-Administered Medications Ordered in Other Encounters  Medication Dose Route Frequency Provider Last Rate Last Admin  . sodium chloride 0.9 % injection 10 mL  10 mL Intravenous PRN Forest Gleason, MD   10 mL at 01/12/15 1116  . sodium chloride 0.9 % injection 10 mL  10 mL Intravenous PRN Forest Gleason, MD   10 mL at 02/16/15 1025     Discharge Medications: Please see discharge summary for a list of discharge medications.  Relevant Imaging Results:  Relevant Lab Results:   Additional Information [id number]  Beverly Sessions, RN

## 2019-01-28 ENCOUNTER — Telehealth: Payer: Self-pay | Admitting: Internal Medicine

## 2019-01-28 MED ORDER — OXYCODONE HCL 20 MG/ML PO CONC: 5.0000 mg | Freq: Four times a day (QID) | ORAL | 0 refills | Status: AC | PRN

## 2019-01-28 MED ORDER — ATROPINE SULFATE 1 % OP SOLN: 4.0000 [drp] | OPHTHALMIC | 0 refills | Status: AC | PRN

## 2019-01-28 MED ORDER — LORAZEPAM 2 MG/ML PO CONC: 0.5000 mg | ORAL | 0 refills | Status: AC | PRN

## 2019-01-28 MED ORDER — FUROSEMIDE 20 MG PO TABS: 20.0000 mg | ORAL_TABLET | Freq: Every day | ORAL | 0 refills | Status: AC | PRN

## 2019-01-28 MED ORDER — FLUCONAZOLE 50 MG PO TABS: 50.0000 mg | ORAL_TABLET | Freq: Every day | ORAL | 0 refills | Status: AC

## 2019-01-28 NOTE — Progress Notes (Signed)
Received a call from [place] at [facility]. They want Nicole Clayton discharged asap. The patient was not swabbed for Covid yesterday but Janett Billow said she spoke with their ED at the facility and they confirmed that she does not have to be swabbed prior to returning. They will swab her once she returns to the facility. Doctor and CM notified.

## 2019-01-28 NOTE — NC FL2 (Signed)
[place] LEVEL OF CARE SCREENING TOOL     IDENTIFICATION  Patient Name: Nicole Clayton Birthdate: 11/13/1928 Sex: female Admission Date (Current Location): 01/23/2019  [facility] and [place] Number:  Engineering geologist and Address:         Provider Number: [id number]  Attending Physician Name and Address:  Lavina Hamman, MD  Relative Name and Phone Number:       Current Level of Care: Hospital Recommended Level of Care: [place] Prior Approval Number:    Date Approved/Denied:   PASRR Number:    Discharge Plan: Other (Comment)(ALF)    Current Diagnoses: Patient Active Problem List   Diagnosis Date Noted  . Hospice care patient 01/25/2019  . Obstructive uropathy 01/24/2019  . DNR no code (do not resuscitate) 01/24/2019  . Candidiasis of skin 10/22/2018  . Intertriginous dermatitis associated with moisture 10/22/2018  . Weakness 10/21/2017  . Pain and swelling of right lower leg 10/11/2016  . Anemia 09/11/2015  . UTI (urinary tract infection) due to Enterococcus 07/19/2015  . 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 Neuropathy 02/10/2015  . Midline cystocele 12/15/2014  . Vaginal atrophy 12/15/2014  . Status post hysterectomy with oophorectomy 12/15/2014  . Celiac disease 11/07/2014  . MI (mitral incompetence) 10/27/2014  . Infection of urinary tract 08/12/2014  . CD (celiac disease) 07/12/2014  . Mixed hyperlipidemia 04/12/2014  . Paroxysmal supraventricular tachycardia ([place]) 04/12/2014  . Thrombocythemia ([place]) 09/26/2013  . Vitamin D deficiency 09/26/2013  . Frequent UTI 09/26/2013  . Chest pain 07/28/2013  . Edema 07/06/2013  . Other symptoms involving urinary system 07/07/2012  . Female genuine stress incontinence 02/10/2012  . Bladder infection, chronic 10/19/2011  . Urge incontinence 10/19/2011  . Malignant neoplasm of right fallopian tube ([place]) 10/05/2009    Orientation RESPIRATION BLADDER Height & Weight     Self, Place   Normal Continent Weight: 62.6 kg Height:  5' (152.4 cm)  BEHAVIORAL SYMPTOMS/MOOD NEUROLOGICAL BOWEL NUTRITION STATUS      Continent Diet(2 gram sodium diet)  AMBULATORY STATUS COMMUNICATION OF NEEDS Skin   Extensive Assist Verbally Normal                       Personal Care Assistance Level of Assistance              Functional Limitations Info             SPECIAL CARE FACTORS FREQUENCY                       Contractures Contractures Info: Not present    Additional Factors Info  Code Status, Allergies Code Status Info: DNR Allergies Info: Benadryl, Tamsulosin, Alendronate Sodium, Duloxetine, Duloxetine Hcl, Risedronate Sodium, Lovastatin, Sulfa Antibiotics           DISCHARGE MEDICATION:      Allergies as of 01/28/2019      Reactions   Benadryl [diphenhydramine] Shortness Of Breath   Sob, rash, swelling   Tamsulosin Swelling   Alendronate Sodium    Other reaction(s): Other (See Comments) Dysphagia   Duloxetine Diarrhea   Duloxetine Hcl Diarrhea   Risedronate Sodium Rash   Aching, dysphagia   Lovastatin    Other reaction(s): Other (See Comments) GI upset   Sulfa Antibiotics Rash   States she can take but leave as allergy         Medication List    STOP taking these medications   acetaminophen-codeine  300-30 MG tablet Commonly known as: TYLENOL #3   gabapentin 300 MG capsule Commonly known as: NEURONTIN   gabapentin 600 MG tablet Commonly known as: NEURONTIN   nitrofurantoin (macrocrystal-monohydrate) 100 MG capsule Commonly known as: MACROBID     TAKE these medications   acetaminophen 650 MG CR tablet Commonly known as: TYLENOL Take 650 mg by mouth 2 (two) times daily as needed for pain.   albuterol 108 (90 Base) MCG/ACT inhaler Commonly known as: VENTOLIN HFA Inhale 2 puffs into the lungs every 6 (six) hours as needed for wheezing or shortness of breath.   aspirin EC 81 MG tablet Take 81 mg by  mouth daily.   atropine 1 % ophthalmic solution Place 4 drops under the tongue every 4 (four) hours as needed (excessive secretions).   benzonatate 200 MG capsule Commonly known as: TESSALON Take 1 capsule by mouth 3 (three) times daily.   calcium carbonate 500 MG chewable tablet Commonly known as: TUMS - dosed in mg elemental calcium Chew 2 tablets by mouth every 2 (two) hours as needed for indigestion or heartburn.   cetirizine 10 MG tablet Commonly known as: ZYRTEC Take 10 mg by mouth daily.   chlorpheniramine-HYDROcodone 10-8 MG/5ML Suer Commonly known as: TUSSIONEX Take 5 mLs by mouth at bedtime as needed for cough.   cholecalciferol 25 MCG (1000 UT) tablet Commonly known as: VITAMIN D Take 1,000 Units by mouth daily.   docusate sodium 100 MG capsule Commonly known as: COLACE Take 100 mg by mouth daily.   fluconazole 50 MG tablet Commonly known as: Diflucan Take 1 tablet (50 mg total) by mouth daily for 7 days.   fluticasone 50 MCG/ACT nasal spray Commonly known as: FLONASE Place 2 sprays into both nostrils daily.   fluticasone-salmeterol 45-21 MCG/ACT inhaler Commonly known as: ADVAIR HFA Inhale 2 puffs into the lungs 2 (two) times daily.   furosemide 20 MG tablet Commonly known as: Lasix Take 1 tablet (20 mg total) by mouth daily as needed for fluid or edema (shortness of breath). What changed:   when to take this  reasons to take this   loperamide 2 MG capsule Commonly known as: IMODIUM Take 2 mg by mouth as needed for diarrhea or loose stools. Do not exceed 4 capsules in a 24 hour period.   LORazepam 2 MG/ML concentrated solution Commonly known as: ATIVAN Place 0.3 mLs (0.6 mg total) under the tongue every 4 (four) hours as needed for anxiety.   metoprolol succinate 50 MG 24 hr tablet Commonly known as: TOPROL-XL Take 50 mg by mouth daily.   multivitamin with minerals Tabs tablet Take 1 tablet by mouth daily.   nystatin  powder Commonly known as: MYCOSTATIN/NYSTOP APPLY TOPICALLY FOUR TIMES A DAY FOR CANDIDIASIS   ondansetron 4 MG tablet Commonly known as: ZOFRAN Take 4 mg by mouth every 8 (eight) hours as needed for nausea or vomiting.   oxybutynin 10 MG 24 hr tablet Commonly known as: DITROPAN-XL TAKE 1 TABLET BY MOUTH AT BEDTIME **DO NOT CRUSH**   oxyCODONE 20 MG/ML concentrated solution Commonly known as: ROXICODONE INTENSOL Place 0.3 mLs (6 mg total) under the tongue every 6 (six) hours as needed for moderate pain, severe pain or breakthrough pain (or dyspnea).   pantoprazole 40 MG tablet Commonly known as: PROTONIX Take 40 mg by mouth daily.   Premarin vaginal cream Generic drug: conjugated estrogens Apply 0.5 grams twice weekly (with applicator if needed).        Relevant Imaging Results:  Relevant Lab Results:   Additional Information [id number]  Beverly Sessions, RN

## 2019-01-28 NOTE — Progress Notes (Signed)
Pt passed beside swallow eval.

## 2019-01-28 NOTE — Telephone Encounter (Signed)
12/23-I visited the patient in the hospital-social visit.  Patient confused.  Spoke to patient's daughter, [name] over the phone-regarding the progression of disease.  Recommend continued hospice care/discharge.

## 2019-01-28 NOTE — TOC Transition Note (Signed)
Transition of Care [facility]) - CM/SW Discharge Note   Patient Details  Name: Nicole Clayton MRN: [id number] Date of Birth: 04-29-1928  Transition [facility] Care [facility]) CM/SW Contact:  Beverly Sessions, RN Phone Number: 01/28/2019, 11:04 AM   Clinical Narrative:    Patient to return to [facility] with Hospice today  Bedside RN received call from [facility] stating they could take patient back today without a repeat covid test, and she would be tested again once she arrived.   Freddie Breech with [facility] notified of discharge  EMS packet and signed DNR on chart. Bedside RN notified Updated Fl2 signed and put in discharge packet along with hard copy of scripts  Bedside RN to notify family of discharge    Final next level of care: Assisted Living(with hospice) Barriers to Discharge: No Barriers Identified   Patient Goals and CMS Choice        Discharge Placement                Patient to be transferred to facility by: EMS Name of family member notified: Bedside RN to notify family    Discharge Plan and Services                                     Social Determinants of Health (SDOH) Interventions     Readmission Risk Interventions Readmission Risk Prevention Plan 01/27/2019  Transportation Screening Complete  Palliative Care Screening Complete  Medication Review (RN Care Manager) Complete  Some recent data might be hidden

## 2019-01-28 NOTE — Discharge Summary (Signed)
[facility] Discharge Summary   Patient: Nicole Clayton [id number]   PCP: Ezequiel Kayser, MD DOB: Jul 05, 1928   Date of admission: 01/23/2019   Date of discharge:  01/28/2019    Discharge Diagnoses:  Principal diagnosis Obstructive uropathy with left hydroureteronephrosis likely secondary to distal left ureteral mass.  Active Problems:   Obstructive uropathy   DNR no code (do not resuscitate)   Hospice care patient  Admitted From: ALF hospice Disposition:  ALF/ILF hospice  Recommendations for Outpatient Follow-up:  1. PCP: please follow up with PCP in 1 week 2. Follow up LABS/TEST:  none  Follow-up Information    Ezequiel Kayser, MD. Schedule an appointment as soon as possible for a visit in 1 week(s).   Specialty: Internal Medicine Contact information: [place] [address] [phone]          Diet recommendation: Cardiac diet  Activity: The patient is advised to gradually reintroduce usual activities,as tolerated  Discharge Condition: good  Code Status: DNR on hospice  History of present illness: As per the H and P dictated on admission, "Nicole Clayton  is a 85 y.o. Caucasian female with a known history of ovarian and fallopian tube cancer, mitral valve prolapse, GERD, DVT and PE, previously on Xarelto, who presented to the emergency room with acute onset of severe left flank pain which has been going on intermittently over the last couple weeks and described as sharp and occasionally stabbing.  She denies any fever or chills.  No cough or wheezing or hemoptysis.  She admits to nausea without vomiting or anterior abdominal pain.  She had urinary frequency without dysuria or hematuria or flank pain.  She has not been having much appetite for the last day.  She has been having increasing dyspnea, mainly on exertion over the last few days.  No loss of taste or smell.  Upon presentation to the emergency room, blood pressure was 146/70  with otherwise normal vital signs.  Pulse oximetry has dropped to 89% on room air with normal pulse oximetry 97 to 99% on 2 L of O2 by nasal cannula.  Labs revealed a BUN of 22 and a creatinine of 1.52 up from 0.7 previously, LDH of 279 and ferritin of 32 with lactic acid 1.2, procalcitonin less than 0.1 and high-sensitivity troponin I of 6 and later 4.  Fibrin derivatives D-dimer was elevated at 1924.65 with fibrinogen of 471.  Two-view chest x-ray showed increased opacity in both lower lung suspicious for pneumonia with a large hiatal hernia and stable cardiomegaly with pulmonary hyperinflation.  Noncontrasted chest CT however showed evidence of interval development of metastatic disease to the lungs and liver and trace bilateral pleural effusions with adjacent with atelectasis.  Abdominal pelvic CT scan showed moderate to severe left-sided hydroureteronephrosis to the level of the urinary bladder that is felt to be secondary to an obstructing mass at the distal left ureter and a large 6.9 cm right inguinal mass concerning for metastatic disease likely pathologically enlarged left inguinal lymph nodes, moderately distended urinary bladder and large hiatal hernia with the majority of the stomach herniated into the thoracic cavity.  It also showed sigmoid diverticulosis without diverticulitis.  The patient was given 500 female IV normal saline bolus.  Contact was made with Dr. Jeffie Pollock who will evaluate the patient this a.m. for cystoureteroscopy and left ureteral stent placement.  The patient will be admitted to a medical monitor bed for further evaluation and management."  Hospital Course:  Summary of her active problems in the hospital is as following. 1. Obstructive uropathy with left hydroureteronephrosis likely secondary to distal left ureteralmass.  Urology was consulted. Since the patient did not have any ongoing pain they recommended conservative measures without any intervention. In and out  catheterization was performed with significant retention. Urinalysis was negative for any acute infection. We will continue to monitor. Bladder scan every 8 hours. In and out catheterization as needed. May require Foley catheter.  2. Acute kidney injury secondary to above.  Patient was given IV hydration. Currently renal function is about the same but the patient appears to be significantly volume overloaded. Patient was given IV Lasix with improvement in renal function. Monitor.  3. 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  Patient is on hospice since January 2020. Continue current medication.  4.Type 2 diabetes mellitus.  Does not appear to be on any medication outpatient. Hemoglobin A1c 6.5 thus meeting the criteria. Patient was started on sliding scale insulin. Currently given the patient's status on hospice will discontinue insulin.  5.  Acute on chronic diastolic CHF. Patient was given IV Lasix x1 and monitor renal function. Discontinue IV fluids.  6.  Neuropathy. Patient is on gabapentin for many years. Asterixis resolved.  7.  Acute metabolic encephalopathy. Likely from polypharmacy. Patient is on gabapentin suspect that this is contributing to her encephalopathy with asterixis in the setting of acute kidney injury. We will hold this medication and monitor.  8.  SVT Patient underwent SVT on 01/27/2019 afternoon. Patient was given Lopressor with improvement. Suspect SVT secondary to fever. Patient may have become septic secondary to obstruction uropathy and may have UTI. Family has decided to transition patient to comfort care.   9.  Goals of care discussion. Advance care planning. Patient is DNR and is currently on hospice. Family's initial goal was to treat what is treatable with the limitation of the DNR. Patient had multiple comorbidities during this hospitalization including  acute on chronic CHF requiring IV Lasix, acute metabolic encephalopathy from medication induced injury as well as AKI as well as UTI. Patient become febrile had SVT on 01/27/2019 with minimal responsiveness. Stroke ruled out. With this discussed with patient's daughter who was at bedside regarding goals of care. Patient was transitioned to comfort care.  Pain control  - [employer] Controlled Substance Reporting System database was reviewed.  On the day of the discharge the patient's vitals were stable, and no other acute medical condition were reported by patient. the patient was felt safe to be discharge at ALF/ILF with no therapy needed on discharge.  Consultants: urology Procedures: none  DISCHARGE MEDICATION: Allergies as of 01/28/2019      Reactions   Benadryl [diphenhydramine] Shortness Of Breath   Sob, rash, swelling   Tamsulosin Swelling   Alendronate Sodium    Other reaction(s): Other (See Comments) Dysphagia   Duloxetine Diarrhea   Duloxetine Hcl Diarrhea   Risedronate Sodium Rash   Aching, dysphagia   Lovastatin    Other reaction(s): Other (See Comments) GI upset   Sulfa Antibiotics Rash   States she can take but leave as allergy      Medication List    STOP taking these medications   acetaminophen-codeine 300-30 MG tablet Commonly known as: TYLENOL #3   gabapentin 300 MG capsule Commonly known as: NEURONTIN   gabapentin 600 MG tablet Commonly known as: NEURONTIN   nitrofurantoin (macrocrystal-monohydrate) 100 MG capsule Commonly known as:  MACROBID     TAKE these medications   acetaminophen 650 MG CR tablet Commonly known as: TYLENOL Take 650 mg by mouth 2 (two) times daily as needed for pain.   albuterol 108 (90 Base) MCG/ACT inhaler Commonly known as: VENTOLIN HFA Inhale 2 puffs into the lungs every 6 (six) hours as needed for wheezing or shortness of breath.   aspirin EC 81 MG tablet Take 81 mg by mouth daily.   atropine 1 % ophthalmic  solution Place 4 drops under the tongue every 4 (four) hours as needed (excessive secretions).   benzonatate 200 MG capsule Commonly known as: TESSALON Take 1 capsule by mouth 3 (three) times daily.   calcium carbonate 500 MG chewable tablet Commonly known as: TUMS - dosed in mg elemental calcium Chew 2 tablets by mouth every 2 (two) hours as needed for indigestion or heartburn.   cetirizine 10 MG tablet Commonly known as: ZYRTEC Take 10 mg by mouth daily.   chlorpheniramine-HYDROcodone 10-8 MG/5ML Suer Commonly known as: TUSSIONEX Take 5 mLs by mouth at bedtime as needed for cough.   cholecalciferol 25 MCG (1000 UT) tablet Commonly known as: VITAMIN D Take 1,000 Units by mouth daily.   docusate sodium 100 MG capsule Commonly known as: COLACE Take 100 mg by mouth daily.   fluconazole 50 MG tablet Commonly known as: Diflucan Take 1 tablet (50 mg total) by mouth daily for 7 days.   fluticasone 50 MCG/ACT nasal spray Commonly known as: FLONASE Place 2 sprays into both nostrils daily.   fluticasone-salmeterol 45-21 MCG/ACT inhaler Commonly known as: ADVAIR HFA Inhale 2 puffs into the lungs 2 (two) times daily.   furosemide 20 MG tablet Commonly known as: Lasix Take 1 tablet (20 mg total) by mouth daily as needed for fluid or edema (shortness of breath). What changed:   when to take this  reasons to take this   loperamide 2 MG capsule Commonly known as: IMODIUM Take 2 mg by mouth as needed for diarrhea or loose stools. Do not exceed 4 capsules in a 24 hour period.   LORazepam 2 MG/ML concentrated solution Commonly known as: ATIVAN Place 0.3 mLs (0.6 mg total) under the tongue every 4 (four) hours as needed for anxiety.   metoprolol succinate 50 MG 24 hr tablet Commonly known as: TOPROL-XL Take 50 mg by mouth daily.   multivitamin with minerals Tabs tablet Take 1 tablet by mouth daily.   nystatin powder Commonly known as: MYCOSTATIN/NYSTOP APPLY TOPICALLY  FOUR TIMES A DAY FOR CANDIDIASIS   ondansetron 4 MG tablet Commonly known as: ZOFRAN Take 4 mg by mouth every 8 (eight) hours as needed for nausea or vomiting.   oxybutynin 10 MG 24 hr tablet Commonly known as: DITROPAN-XL TAKE 1 TABLET BY MOUTH AT BEDTIME **DO NOT CRUSH**   oxyCODONE 20 MG/ML concentrated solution Commonly known as: ROXICODONE INTENSOL Place 0.3 mLs (6 mg total) under the tongue every 6 (six) hours as needed for moderate pain, severe pain or breakthrough pain (or dyspnea).   pantoprazole 40 MG tablet Commonly known as: PROTONIX Take 40 mg by mouth daily.   Premarin vaginal cream Generic drug: conjugated estrogens Apply 0.5 grams twice weekly (with applicator if needed).      Allergies  Allergen Reactions  . Benadryl [Diphenhydramine] Shortness Of Breath    Sob, rash, swelling  . Tamsulosin Swelling  . Alendronate Sodium     Other reaction(s): Other (See Comments) Dysphagia  . Duloxetine Diarrhea  . Duloxetine Hcl  Diarrhea  . Risedronate Sodium Rash    Aching, dysphagia  . Lovastatin     Other reaction(s): Other (See Comments) GI upset  . Sulfa Antibiotics Rash    States she can take but leave as allergy   Discharge Instructions    Diet - low sodium heart healthy   Complete by: As directed    Increase activity slowly   Complete by: As directed      Discharge Exam: Filed Weights   01/23/19 2017  Weight: 62.6 kg   Vitals:   01/27/19 1833 01/27/19 1938  BP: (!) 113/94 140/74  Pulse: (!) 106 94  Resp: (!) 24 18  Temp: (!) 102.9 F (39.4 C) (!) 102.9 F (39.4 C)  SpO2: 97% 97%   General: Appear in mild distress, no Rash; Oral Mucosa Clear, moist. no Abnormal Mass Or lumps Cardiovascular: S1 and S2 Present, no Murmur, Respiratory: normal respiratory effort, Bilateral Air entry present and Clear to Auscultation, no Crackles, no wheezes Abdomen: Bowel Sound present, Soft and no tenderness, no hernia Extremities: no Pedal edema, no calf  tenderness Neurology: alert and oriented to time, place, and person affect appropriate.  The results of significant diagnostics from this hospitalization (including imaging, microbiology, ancillary and laboratory) are listed below for reference.    Significant Diagnostic Studies: CT ABDOMEN PELVIS WO CONTRAST  Result Date: 01/24/2019 CLINICAL DATA:  Flank pain. Stone disease suspected. History of adenocarcinoma of the fallopian tube now with sharp left flank pain and nausea. A TI and hypoxemia. Possible pneumonia on chest x-ray. EXAM: CT CHEST, ABDOMEN AND PELVIS WITHOUT CONTRAST TECHNIQUE: Multidetector CT imaging of the chest, abdomen and pelvis was performed following the standard protocol without IV contrast. COMPARISON:  CT dated 10/21/2017. FINDINGS: CT CHEST FINDINGS Cardiovascular: The heart is mildly enlarged. Aortic calcifications are noted. There is a well-positioned right-sided Port-A-Cath with tip terminating near the cavoatrial junction. There is no significant pericardial effusion. Mediastinum/Nodes: --No mediastinal or hilar lymphadenopathy. --No axillary lymphadenopathy. --No supraclavicular lymphadenopathy. --Normal thyroid gland. --there is a large hiatal hernia with the majority of the stomach herniated into the thoracic cavity. Lungs/Pleura: There metastatic pulmonary nodules throughout the lung. The largest is located in the right lower lobe and measures approximately 2.2 cm. These were not present on the patient's CT chest from Jun 10, 2017. There is atelectasis at the lung bases. There are trace bilateral pleural effusions. There is no pneumothorax. The trachea is unremarkable. Musculoskeletal: No chest wall abnormality. No acute or significant osseous findings. CT ABDOMEN PELVIS FINDINGS Hepatobiliary: There is a 2.4 cm hypoattenuating mass in the right hepatic lobe. The left hepatic lobe appears somewhat atrophic. This is not significantly changed from prior study. Status post  cholecystectomy.There is biliary ductal dilatation involving the common bile duct, not significantly changed from prior study. Pancreas: Normal contours without ductal dilatation. No peripancreatic fluid collection. Spleen: No splenic laceration or hematoma. Adrenals/Urinary Tract: --Adrenal glands: No adrenal hemorrhage. --Right kidney/ureter: No hydronephrosis or perinephric hematoma. --Left kidney/ureter: There is moderate to severe left-sided hydroureteronephrosis to the level of the urinary bladder. This is felt to be secondary to an obstructing mass in the distal left ureter (axial series 2, image 77). --Urinary bladder: The bladder is moderately distended. Stomach/Bowel: --Stomach/Duodenum: The majority of the stomach is herniated into the thoracic cavity. --Small bowel: No dilatation or inflammation. --Colon: There is a large amount of stool in the colon. There is sigmoid diverticulosis without CT evidence for diverticulitis. --Appendix: Not visualized. No right lower quadrant  inflammation or free fluid. Vascular/Lymphatic: Atherosclerotic calcification is present within the non-aneurysmal abdominal aorta, without hemodynamically significant stenosis. --there are mildly enlarged retroperitoneal lymph nodes. --No mesenteric lymphadenopathy. --their pathologically enlarged inguinal lymph nodes. For example there is a 1.4 cm left inguinal lymph node. Reproductive: Status post hysterectomy. No adnexal mass. There is a pessary in place. Other: No ascites or free air. There is a large right inguinal mass measuring approximately 6.9 by 5.7 by 6.8 cm. Musculoskeletal. There is stable height loss of the L3 vertebral body. There is grade 1 anterolisthesis of L4 on L5, likely degenerative in etiology. IMPRESSION: 1. Moderate to severe left-sided hydroureteronephrosis to the level of the urinary bladder, felt to be secondary to an obstructing mass in the distal left ureter. 2. Interval development of metastatic disease  to the lungs and liver. 3. Large 6.9 cm right inguinal mass concerning for metastatic disease. There are pathologically enlarged left inguinal lymph nodes. 4. Moderately distended urinary bladder. 5. Large hiatal hernia with the majority of the stomach herniated into the thoracic cavity. 6. Trace bilateral pleural effusions with adjacent atelectasis. 7. Sigmoid diverticulosis without CT evidence for diverticulitis. Aortic Atherosclerosis (ICD10-I70.0). Electronically Signed   By: Constance Holster M.D.   On: 01/24/2019 00:45   DG Chest 2 View  Result Date: 01/23/2019 CLINICAL DATA:  Hypoxia. Nausea and vomiting. Left flank pain. EXAM: CHEST - 2 VIEW COMPARISON:  03/26/2018 FINDINGS: Stable cardiomegaly. Right-sided Port-A-Cath remains in place. Large hiatal hernia is again seen. Increased opacity is seen in both lower lungs, suspicious for pneumonia. No evidence of pleural effusion. Pulmonary hyperinflation is again seen. IMPRESSION: 1. Increased opacity in both lower lungs, suspicious for pneumonia. 2. Large hiatal hernia 3. Stable cardiomegaly and pulmonary hyperinflation. Electronically Signed   By: Marlaine Hind M.D.   On: 01/23/2019 21:00   CT Chest Wo Contrast  Result Date: 01/24/2019 CLINICAL DATA:  Flank pain. Stone disease suspected. History of adenocarcinoma of the fallopian tube now with sharp left flank pain and nausea. A TI and hypoxemia. Possible pneumonia on chest x-ray. EXAM: CT CHEST, ABDOMEN AND PELVIS WITHOUT CONTRAST TECHNIQUE: Multidetector CT imaging of the chest, abdomen and pelvis was performed following the standard protocol without IV contrast. COMPARISON:  CT dated 10/21/2017. FINDINGS: CT CHEST FINDINGS Cardiovascular: The heart is mildly enlarged. Aortic calcifications are noted. There is a well-positioned right-sided Port-A-Cath with tip terminating near the cavoatrial junction. There is no significant pericardial effusion. Mediastinum/Nodes: --No mediastinal or hilar  lymphadenopathy. --No axillary lymphadenopathy. --No supraclavicular lymphadenopathy. --Normal thyroid gland. --there is a large hiatal hernia with the majority of the stomach herniated into the thoracic cavity. Lungs/Pleura: There metastatic pulmonary nodules throughout the lung. The largest is located in the right lower lobe and measures approximately 2.2 cm. These were not present on the patient's CT chest from Jun 10, 2017. There is atelectasis at the lung bases. There are trace bilateral pleural effusions. There is no pneumothorax. The trachea is unremarkable. Musculoskeletal: No chest wall abnormality. No acute or significant osseous findings. CT ABDOMEN PELVIS FINDINGS Hepatobiliary: There is a 2.4 cm hypoattenuating mass in the right hepatic lobe. The left hepatic lobe appears somewhat atrophic. This is not significantly changed from prior study. Status post cholecystectomy.There is biliary ductal dilatation involving the common bile duct, not significantly changed from prior study. Pancreas: Normal contours without ductal dilatation. No peripancreatic fluid collection. Spleen: No splenic laceration or hematoma. Adrenals/Urinary Tract: --Adrenal glands: No adrenal hemorrhage. --Right kidney/ureter: No hydronephrosis or perinephric hematoma. --Left  kidney/ureter: There is moderate to severe left-sided hydroureteronephrosis to the level of the urinary bladder. This is felt to be secondary to an obstructing mass in the distal left ureter (axial series 2, image 77). --Urinary bladder: The bladder is moderately distended. Stomach/Bowel: --Stomach/Duodenum: The majority of the stomach is herniated into the thoracic cavity. --Small bowel: No dilatation or inflammation. --Colon: There is a large amount of stool in the colon. There is sigmoid diverticulosis without CT evidence for diverticulitis. --Appendix: Not visualized. No right lower quadrant inflammation or free fluid. Vascular/Lymphatic: Atherosclerotic  calcification is present within the non-aneurysmal abdominal aorta, without hemodynamically significant stenosis. --there are mildly enlarged retroperitoneal lymph nodes. --No mesenteric lymphadenopathy. --their pathologically enlarged inguinal lymph nodes. For example there is a 1.4 cm left inguinal lymph node. Reproductive: Status post hysterectomy. No adnexal mass. There is a pessary in place. Other: No ascites or free air. There is a large right inguinal mass measuring approximately 6.9 by 5.7 by 6.8 cm. Musculoskeletal. There is stable height loss of the L3 vertebral body. There is grade 1 anterolisthesis of L4 on L5, likely degenerative in etiology. IMPRESSION: 1. Moderate to severe left-sided hydroureteronephrosis to the level of the urinary bladder, felt to be secondary to an obstructing mass in the distal left ureter. 2. Interval development of metastatic disease to the lungs and liver. 3. Large 6.9 cm right inguinal mass concerning for metastatic disease. There are pathologically enlarged left inguinal lymph nodes. 4. Moderately distended urinary bladder. 5. Large hiatal hernia with the majority of the stomach herniated into the thoracic cavity. 6. Trace bilateral pleural effusions with adjacent atelectasis. 7. Sigmoid diverticulosis without CT evidence for diverticulitis. Aortic Atherosclerosis (ICD10-I70.0). Electronically Signed   By: Constance Holster M.D.   On: 01/24/2019 00:45   DG Chest Port 1 View  Result Date: 01/25/2019 CLINICAL DATA:  Shortness of breath. Acute onset of severe left flank pain. EXAM: PORTABLE CHEST 1 VIEW COMPARISON:  Chest x-ray dated 01/23/2019 and CT scan of the chest dated 01/24/2019 FINDINGS: Port-A-Cath in place with the tip in the right atrium, unchanged. Heart size is normal. New pulmonary vascular prominence, most prominent at the left lung base. Huge chronic hiatal hernia. Increased small right pleural effusion. New tiny left pleural effusion. Multiple metastatic  pulmonary nodules noted bilaterally. IMPRESSION: 1. New pulmonary vascular congestion and small bilateral pleural effusions. 2. Multiple metastatic nodules in both lungs as demonstrated on the prior CT scan. 3. Huge chronic hiatal hernia more prominent than on the prior chest x-ray. Electronically Signed   By: Lorriane Shire M.D.   On: 01/25/2019 11:53    Microbiology: Recent Results (from the past 240 hour(s))  Blood Culture (routine x 2)     Status: None (Preliminary result)   Collection Time: 01/24/19 12:52 AM   Specimen: BLOOD  Result Value Ref Range Status   Specimen Description BLOOD LEFT ANTECUBITAL  Final   Special Requests   Final    BOTTLES DRAWN AEROBIC AND ANAEROBIC Blood Culture adequate volume   Culture   Final    NO GROWTH 4 DAYS Performed at [facility], [address] [place], [place] [address]    Report Status PENDING  Incomplete  Blood Culture (routine x 2)     Status: Abnormal   Collection Time: 01/24/19 12:53 AM   Specimen: BLOOD  Result Value Ref Range Status   Specimen Description   Final    BLOOD LEFT FOREARM Performed at [facility], [place],  [place] [address]    Special Requests   Final    BOTTLES DRAWN AEROBIC AND ANAEROBIC Blood Culture adequate volume Performed at [facility], [address] [place], [place] [address]    Culture  Setup Time   Final    GRAM POSITIVE COCCOBACILLUS ANAEROBIC BOTTLE ONLY CRITICAL RESULT CALLED TO, READ BACK BY AND VERIFIED WITH: [facility] HALLAJI AT [id number] 01/25/2019 SDR    Culture (A)  Final    LACTOBACILLUS SPECIES Standardized susceptibility testing for this organism is not available. Performed at [facility], [place] [address], [place], [place] [address]    Report Status 01/27/2019 FINAL  Final  Blood Culture ID Panel (Reflexed)     Status: None   Collection Time: 01/24/19 12:53 AM  Result Value Ref Range Status   Enterococcus species NOT DETECTED NOT DETECTED  Final   Listeria monocytogenes NOT DETECTED NOT DETECTED Final   Staphylococcus species NOT DETECTED NOT DETECTED Final   Staphylococcus aureus (BCID) NOT DETECTED NOT DETECTED Final   Streptococcus species NOT DETECTED NOT DETECTED Final   Streptococcus agalactiae NOT DETECTED NOT DETECTED Final   Streptococcus pneumoniae NOT DETECTED NOT DETECTED Final   Streptococcus pyogenes NOT DETECTED NOT DETECTED Final   Acinetobacter baumannii NOT DETECTED NOT DETECTED Final   Enterobacteriaceae species NOT DETECTED NOT DETECTED Final   Enterobacter cloacae complex NOT DETECTED NOT DETECTED Final   Escherichia coli NOT DETECTED NOT DETECTED Final   Klebsiella oxytoca NOT DETECTED NOT DETECTED Final   Klebsiella pneumoniae NOT DETECTED NOT DETECTED Final   Proteus species NOT DETECTED NOT DETECTED Final   Serratia marcescens NOT DETECTED NOT DETECTED Final   Haemophilus influenzae NOT DETECTED NOT DETECTED Final   Neisseria meningitidis NOT DETECTED NOT DETECTED Final   Pseudomonas aeruginosa NOT DETECTED NOT DETECTED Final   Candida albicans NOT DETECTED NOT DETECTED Final   Candida glabrata NOT DETECTED NOT DETECTED Final   Candida krusei NOT DETECTED NOT DETECTED Final   Candida parapsilosis NOT DETECTED NOT DETECTED Final   Candida tropicalis NOT DETECTED NOT DETECTED Final    Comment: Performed at [facility], [address] [place],  [address]  Respiratory Panel by RT PCR (Flu A&B, Covid) - Nasopharyngeal Swab     Status: None   Collection Time: 01/24/19  3:50 AM   Specimen: Nasopharyngeal Swab  Result Value Ref Range Status   SARS Coronavirus 2 by RT PCR NEGATIVE NEGATIVE Final    Comment: (NOTE) SARS-CoV-2 target nucleic acids are NOT DETECTED. The SARS-CoV-2 RNA is generally detectable in upper respiratoy specimens during the acute phase of infection. The lowest concentration of SARS-CoV-2 viral copies this assay can detect is 131 copies/mL. A negative result  does not preclude SARS-Cov-2 infection and should not be used as the sole basis for treatment or other patient management decisions. A negative result may occur with  improper specimen collection/handling, submission of specimen other than nasopharyngeal swab, presence of viral mutation(s) within the areas targeted by this assay, and inadequate number of viral copies (<131 copies/mL). A negative result must be combined with clinical observations, patient history, and epidemiological information. The expected result is Negative. Fact Sheet for Patients:  PinkCheek.be Fact Sheet for Healthcare Providers:  GravelBags.it This test is not yet ap proved or cleared by the [place] FDA and  has been authorized for detection and/or diagnosis of SARS-CoV-2 by FDA under an Emergency Use Authorization (EUA). This EUA will remain  in effect (meaning this test can be  used) for the duration of the COVID-19 declaration under Section 564(b)(1) of the Act, 21 U.S.C. section 360bbb-3(b)(1), unless the authorization is terminated or revoked sooner.    Influenza A by PCR NEGATIVE NEGATIVE Final   Influenza B by PCR NEGATIVE NEGATIVE Final    Comment: (NOTE) The Xpert Xpress SARS-CoV-2/FLU/RSV assay is intended as an aid in  the diagnosis of influenza from Nasopharyngeal swab specimens and  should not be used as a sole basis for treatment. Nasal washings and  aspirates are unacceptable for Xpert Xpress SARS-CoV-2/FLU/RSV  testing. Fact Sheet for Patients: PinkCheek.be Fact Sheet for Healthcare Providers: GravelBags.it This test is not yet approved or cleared by the [place] FDA and  has been authorized for detection and/or diagnosis of SARS-CoV-2 by  FDA under an Emergency Use Authorization (EUA). This EUA will remain  in effect (meaning this test can be used) for the duration of  the  Covid-19 declaration under Section 564(b)(1) of the Act, 21  U.S.C. section 360bbb-3(b)(1), unless the authorization is  terminated or revoked. Performed at [facility], [address] [place], [place] [address]   MRSA PCR Screening     Status: Abnormal   Collection Time: 01/24/19 11:22 AM   Specimen: Nasal Mucosa; Nasopharyngeal  Result Value Ref Range Status   MRSA by PCR POSITIVE (A) NEGATIVE Final    Comment:        The GeneXpert MRSA Assay (FDA approved for NASAL specimens only), is one component of a comprehensive MRSA colonization surveillance program. It is not intended to diagnose MRSA infection nor to guide or monitor treatment for MRSA infections. RESULT CALLED TO, READ BACK BY AND VERIFIED WITH: SIERRA CLOER AT 1308 01/24/2019.PMF Performed at [facility], [address] [place], [place] [address]   Urine Culture     Status: Abnormal   Collection Time: 01/24/19 11:22 AM   Specimen: Urine, Catheterized  Result Value Ref Range Status   Specimen Description   Final    URINE, CATHETERIZED Performed at [facility], [address] [place], [place] [address]    Special Requests   Final    NONE Performed at [facility], [address] [place], [place] [address]    Culture >=100,000 COLONIES/mL YEAST (A)  Final   Report Status 01/25/2019 FINAL  Final     Labs: CBC: Recent Labs  Lab 01/23/19 2024 01/24/19 0557 01/26/19 0609 01/27/19 0531  WBC 8.7 7.4 10.0 9.2  NEUTROABS 6.7  --   --   --   HGB 12.3 11.7* 12.1 11.5*  HCT 38.1 36.4 37.4 33.2*  MCV 96.2 96.6 95.2 88.8  PLT 380 340 374 [id number]   Basic Metabolic Panel: Recent Labs  Lab 01/23/19 2024 01/24/19 0557 01/25/19 1104 01/26/19 0609  NA 136 138 136 136  K 4.3 4.1 4.0 3.5  CL 101 104 103 98  CO2 26 26 25 27   GLUCOSE 142* 113* 143* 126*  BUN 22 21 23 20   CREATININE 1.52* 1.49* 1.43* 1.25*  CALCIUM 8.7* 8.5* 8.2* 8.6*  MG  --   --  1.8  --     Liver Function Tests: Recent Labs  Lab 01/23/19 2024  AST 32  ALT 12  ALKPHOS 70  BILITOT 0.4  PROT 7.3  ALBUMIN 3.6   No results for input(s): LIPASE, AMYLASE in the last 168 hours. No results for input(s): AMMONIA in the last 168 hours. Cardiac Enzymes: No results for input(s): CKTOTAL, CKMB, CKMBINDEX, TROPONINI in  the last 168 hours. BNP (last 3 results) Recent Labs    02/17/18 1040 01/23/19 2024 01/25/19 1104  BNP 196.0* 247.0* 466.0*   CBG: Recent Labs  Lab 01/24/19 1706 01/24/19 2130 01/25/19 0751 01/27/19 1726 01/27/19 1903  GLUCAP 85 122* 86 180* 197*    Time spent: 35 minutes  Signed:  Berle Mull  [facility]  01/28/2019 10:03 AM

## 2019-01-28 NOTE — Progress Notes (Signed)
SLP Cancellation Note  Patient Details Name: Nicole Clayton MRN: [id number] DOB: 01-Aug-1928   Cancelled treatment:       Reason Eval/Treat Not Completed: SLP screened, no needs identified, will sign off(chart reviewed; consulted NSG re: pt's status). Per NSG, pt was placed on comfort care w/ Hospice services ordered. This morning, she was requesting po's so NSG contacted [place], MD. MD suggested a NSG swallow screen(yale screen). NSG and SLP discussed this; NSG completed the screen and pt passed. A diet ordered was then ordered by NSG/MD. Upon contacting NSG again, pt had eaten breakfast meal w/ No swallowing issues or concerns by NSG. Pt is being discharged home this morning w/ family w/ Hospice services.  No ST services indicated at this time as pt appears to be at her baseline w/ re: to toleration of oral intake. NSG to monitor while admitted and reconsult ST services if any needs arise. NSG agreed. Recommend general aspiration precautions w/ any oral intake.     Orinda Kenner, MS, CCC-SLP Deyna Carbon 01/28/2019, 11:10 AM

## 2019-01-29 LAB — CULTURE, BLOOD (ROUTINE X 2)
Culture: NO GROWTH
Special Requests: ADEQUATE

## 2019-03-08 DEATH — deceased
